# Patient Record
Sex: Female | Born: 1955 | Race: White | Hispanic: No | Marital: Married | Smoking: Former smoker
Health system: Southern US, Community
[De-identification: ages and names within clinical notes are randomized; demographics above are authoritative.]

## PROBLEM LIST (undated history)

## (undated) DIAGNOSIS — F32A Depression, unspecified: Secondary | ICD-10-CM

## (undated) DIAGNOSIS — R7302 Impaired glucose tolerance (oral): Secondary | ICD-10-CM

## (undated) DIAGNOSIS — N189 Chronic kidney disease, unspecified: Secondary | ICD-10-CM

## (undated) DIAGNOSIS — G4730 Sleep apnea, unspecified: Secondary | ICD-10-CM

## (undated) DIAGNOSIS — G4733 Obstructive sleep apnea (adult) (pediatric): Secondary | ICD-10-CM

## (undated) DIAGNOSIS — S161XXA Strain of muscle, fascia and tendon at neck level, initial encounter: Secondary | ICD-10-CM

## (undated) DIAGNOSIS — F329 Major depressive disorder, single episode, unspecified: Secondary | ICD-10-CM

## (undated) DIAGNOSIS — R931 Abnormal findings on diagnostic imaging of heart and coronary circulation: Secondary | ICD-10-CM

## (undated) DIAGNOSIS — F419 Anxiety disorder, unspecified: Secondary | ICD-10-CM

## (undated) DIAGNOSIS — Q0700 Arnold-Chiari syndrome without spina bifida or hydrocephalus: Secondary | ICD-10-CM

## (undated) DIAGNOSIS — Z87442 Personal history of urinary calculi: Secondary | ICD-10-CM

## (undated) DIAGNOSIS — S060X9A Concussion with loss of consciousness of unspecified duration, initial encounter: Secondary | ICD-10-CM

## (undated) DIAGNOSIS — M1990 Unspecified osteoarthritis, unspecified site: Secondary | ICD-10-CM

## (undated) DIAGNOSIS — R42 Dizziness and giddiness: Secondary | ICD-10-CM

## (undated) DIAGNOSIS — K219 Gastro-esophageal reflux disease without esophagitis: Secondary | ICD-10-CM

## (undated) DIAGNOSIS — I429 Cardiomyopathy, unspecified: Secondary | ICD-10-CM

## (undated) DIAGNOSIS — E669 Obesity, unspecified: Secondary | ICD-10-CM

## (undated) DIAGNOSIS — I10 Essential (primary) hypertension: Secondary | ICD-10-CM

## (undated) DIAGNOSIS — Z8719 Personal history of other diseases of the digestive system: Secondary | ICD-10-CM

## (undated) DIAGNOSIS — Z780 Asymptomatic menopausal state: Secondary | ICD-10-CM

## (undated) DIAGNOSIS — G43909 Migraine, unspecified, not intractable, without status migrainosus: Secondary | ICD-10-CM

## (undated) DIAGNOSIS — R51 Headache: Secondary | ICD-10-CM

## (undated) DIAGNOSIS — G5600 Carpal tunnel syndrome, unspecified upper limb: Secondary | ICD-10-CM

## (undated) DIAGNOSIS — G4486 Cervicogenic headache: Secondary | ICD-10-CM

## (undated) HISTORY — DX: Cervicogenic headache: G44.86

## (undated) HISTORY — DX: Arnold-Chiari syndrome without spina bifida or hydrocephalus: Q07.00

## (undated) HISTORY — DX: Sleep apnea, unspecified: G47.30

## (undated) HISTORY — DX: Major depressive disorder, single episode, unspecified: F32.9

## (undated) HISTORY — DX: Strain of muscle, fascia and tendon at neck level, initial encounter: S16.1XXA

## (undated) HISTORY — DX: Chronic kidney disease, unspecified: N18.9

## (undated) HISTORY — PX: COLOSTOMY: SHX63

## (undated) HISTORY — DX: Migraine, unspecified, not intractable, without status migrainosus: G43.909

## (undated) HISTORY — PX: BUNIONECTOMY: SHX129

## (undated) HISTORY — DX: Asymptomatic menopausal state: Z78.0

## (undated) HISTORY — DX: Obesity, unspecified: E66.9

## (undated) HISTORY — DX: Depression, unspecified: F32.A

## (undated) HISTORY — DX: Carpal tunnel syndrome, unspecified upper limb: G56.00

## (undated) HISTORY — DX: Headache: R51

## (undated) HISTORY — DX: Abnormal findings on diagnostic imaging of heart and coronary circulation: R93.1

## (undated) HISTORY — DX: Impaired glucose tolerance (oral): R73.02

## (undated) HISTORY — PX: HAND SURGERY: SHX662

## (undated) HISTORY — DX: Dizziness and giddiness: R42

## (undated) HISTORY — PX: UPPER GASTROINTESTINAL ENDOSCOPY: SHX188

## (undated) HISTORY — DX: Obstructive sleep apnea (adult) (pediatric): G47.33

## (undated) HISTORY — PX: GYNECOLOGIC CRYOSURGERY: SHX857

## (undated) HISTORY — PX: ABDOMINAL HYSTERECTOMY: SHX81

## (undated) HISTORY — PX: TUBAL LIGATION: SHX77

---

## 1997-11-24 ENCOUNTER — Emergency Department (HOSPITAL_COMMUNITY): Admission: EM | Admit: 1997-11-24 | Discharge: 1997-11-24 | Payer: Self-pay | Admitting: *Deleted

## 1999-04-10 ENCOUNTER — Encounter: Payer: Self-pay | Admitting: Emergency Medicine

## 1999-04-10 ENCOUNTER — Emergency Department (HOSPITAL_COMMUNITY): Admission: EM | Admit: 1999-04-10 | Discharge: 1999-04-10 | Payer: Self-pay | Admitting: Emergency Medicine

## 1999-06-27 ENCOUNTER — Inpatient Hospital Stay (HOSPITAL_COMMUNITY): Admission: EM | Admit: 1999-06-27 | Discharge: 1999-06-30 | Payer: Self-pay | Admitting: Emergency Medicine

## 2000-02-19 ENCOUNTER — Other Ambulatory Visit: Admission: RE | Admit: 2000-02-19 | Discharge: 2000-02-19 | Payer: Self-pay | Admitting: Orthopedic Surgery

## 2000-07-24 ENCOUNTER — Other Ambulatory Visit: Admission: RE | Admit: 2000-07-24 | Discharge: 2000-07-24 | Payer: Self-pay | Admitting: Obstetrics and Gynecology

## 2001-07-23 HISTORY — PX: VAGINAL HYSTERECTOMY: SUR661

## 2001-07-23 HISTORY — PX: ABDOMINAL HYSTERECTOMY: SHX81

## 2002-08-13 ENCOUNTER — Other Ambulatory Visit: Admission: RE | Admit: 2002-08-13 | Discharge: 2002-08-13 | Payer: Self-pay | Admitting: Obstetrics & Gynecology

## 2003-06-01 ENCOUNTER — Encounter (INDEPENDENT_AMBULATORY_CARE_PROVIDER_SITE_OTHER): Payer: Self-pay

## 2003-06-01 ENCOUNTER — Ambulatory Visit (HOSPITAL_BASED_OUTPATIENT_CLINIC_OR_DEPARTMENT_OTHER): Admission: RE | Admit: 2003-06-01 | Discharge: 2003-06-01 | Payer: Self-pay | Admitting: Gynecology

## 2003-06-01 ENCOUNTER — Ambulatory Visit (HOSPITAL_COMMUNITY): Admission: RE | Admit: 2003-06-01 | Discharge: 2003-06-01 | Payer: Self-pay | Admitting: Gynecology

## 2004-02-04 ENCOUNTER — Emergency Department (HOSPITAL_COMMUNITY): Admission: EM | Admit: 2004-02-04 | Discharge: 2004-02-04 | Payer: Self-pay | Admitting: Emergency Medicine

## 2004-04-14 ENCOUNTER — Emergency Department (HOSPITAL_COMMUNITY): Admission: EM | Admit: 2004-04-14 | Discharge: 2004-04-14 | Payer: Self-pay | Admitting: Emergency Medicine

## 2004-07-05 ENCOUNTER — Other Ambulatory Visit: Admission: RE | Admit: 2004-07-05 | Discharge: 2004-07-05 | Payer: Self-pay | Admitting: Gynecology

## 2004-07-11 ENCOUNTER — Observation Stay (HOSPITAL_COMMUNITY): Admission: RE | Admit: 2004-07-11 | Discharge: 2004-07-12 | Payer: Self-pay | Admitting: Gynecology

## 2004-07-11 ENCOUNTER — Encounter (INDEPENDENT_AMBULATORY_CARE_PROVIDER_SITE_OTHER): Payer: Self-pay | Admitting: Specialist

## 2004-07-23 DIAGNOSIS — Q0700 Arnold-Chiari syndrome without spina bifida or hydrocephalus: Secondary | ICD-10-CM

## 2004-07-23 HISTORY — DX: Arnold-Chiari syndrome without spina bifida or hydrocephalus: Q07.00

## 2004-09-19 ENCOUNTER — Ambulatory Visit: Payer: Self-pay | Admitting: Internal Medicine

## 2005-01-09 ENCOUNTER — Ambulatory Visit: Payer: Self-pay | Admitting: Internal Medicine

## 2005-03-03 ENCOUNTER — Encounter: Admission: RE | Admit: 2005-03-03 | Discharge: 2005-03-03 | Payer: Self-pay | Admitting: Neurology

## 2005-03-08 ENCOUNTER — Ambulatory Visit: Payer: Self-pay | Admitting: Psychology

## 2005-04-27 ENCOUNTER — Ambulatory Visit: Payer: Self-pay | Admitting: Psychology

## 2006-04-11 ENCOUNTER — Emergency Department (HOSPITAL_COMMUNITY): Admission: EM | Admit: 2006-04-11 | Discharge: 2006-04-11 | Payer: Self-pay | Admitting: Emergency Medicine

## 2007-09-19 ENCOUNTER — Emergency Department (HOSPITAL_COMMUNITY): Admission: EM | Admit: 2007-09-19 | Discharge: 2007-09-19 | Payer: Self-pay | Admitting: Neurosurgery

## 2007-09-26 ENCOUNTER — Emergency Department (HOSPITAL_COMMUNITY): Admission: EM | Admit: 2007-09-26 | Discharge: 2007-09-26 | Payer: Self-pay | Admitting: Family Medicine

## 2007-10-03 ENCOUNTER — Ambulatory Visit (HOSPITAL_COMMUNITY): Payer: Self-pay | Admitting: Psychology

## 2007-10-14 ENCOUNTER — Ambulatory Visit (HOSPITAL_COMMUNITY): Payer: Self-pay | Admitting: Psychology

## 2007-10-23 ENCOUNTER — Ambulatory Visit (HOSPITAL_COMMUNITY): Payer: Self-pay | Admitting: Psychology

## 2008-01-22 ENCOUNTER — Ambulatory Visit (HOSPITAL_COMMUNITY): Payer: Self-pay | Admitting: Psychology

## 2008-01-29 ENCOUNTER — Encounter: Payer: Self-pay | Admitting: Internal Medicine

## 2008-09-13 ENCOUNTER — Emergency Department (HOSPITAL_COMMUNITY): Admission: EM | Admit: 2008-09-13 | Discharge: 2008-09-13 | Payer: Self-pay | Admitting: Family Medicine

## 2010-02-18 ENCOUNTER — Encounter: Payer: Self-pay | Admitting: Cardiovascular Disease

## 2010-02-27 ENCOUNTER — Encounter: Payer: Self-pay | Admitting: Cardiovascular Disease

## 2010-03-16 DIAGNOSIS — R609 Edema, unspecified: Secondary | ICD-10-CM

## 2010-05-22 ENCOUNTER — Encounter (INDEPENDENT_AMBULATORY_CARE_PROVIDER_SITE_OTHER): Payer: Self-pay | Admitting: *Deleted

## 2010-06-02 ENCOUNTER — Ambulatory Visit (HOSPITAL_COMMUNITY): Payer: Self-pay | Admitting: Psychology

## 2010-06-09 ENCOUNTER — Encounter (INDEPENDENT_AMBULATORY_CARE_PROVIDER_SITE_OTHER): Payer: Self-pay | Admitting: *Deleted

## 2010-06-12 ENCOUNTER — Ambulatory Visit: Payer: Self-pay | Admitting: Gastroenterology

## 2010-07-03 ENCOUNTER — Ambulatory Visit: Payer: Self-pay | Admitting: Gastroenterology

## 2010-07-04 ENCOUNTER — Encounter: Payer: Self-pay | Admitting: Gastroenterology

## 2010-08-13 ENCOUNTER — Encounter: Payer: Self-pay | Admitting: Neurology

## 2010-08-24 NOTE — Letter (Signed)
Summary: [facility] Instructions  [facility]  [address] [place], [place] [address]   Phone: [phone]  Fax: [phone]       Carrie Reynolds    [birth year]    MRN: [id number]        Procedure Day Dorna Bloom:  Duanne Limerick  07/03/10     Arrival Time:  8:00AM     Procedure Time:  9:00AM     Location of Procedure:                    Juliann Pares  [facility] (4th Floor)                     PREPARATION FOR COLONOSCOPY WITH MOVIPREP   Starting 5 days prior to your procedure 06/28/10 do not eat nuts, seeds, popcorn, corn, beans, peas,  salads, or any raw vegetables.  Do not take any fiber supplements (e.g. Metamucil, Citrucel, and Benefiber).  THE DAY BEFORE YOUR PROCEDURE         DATE: 07/02/10  DAY: SUNDAY  1.  Drink clear liquids the entire day-NO SOLID FOOD  2.  Do not drink anything colored red or purple.  Avoid juices with pulp.  No orange juice.  3.  Drink at least 64 oz. (8 glasses) of fluid/clear liquids during the day to prevent dehydration and help the prep work efficiently.  CLEAR LIQUIDS INCLUDE: Water Jello Ice Popsicles Tea (sugar ok, no milk/cream) Powdered fruit flavored drinks Coffee (sugar ok, no milk/cream) Gatorade Juice: apple, white grape, white cranberry  Lemonade Clear bullion, consomm, broth Carbonated beverages (any kind) Strained chicken noodle soup Hard Candy                             4.  In the morning, mix first dose of MoviPrep solution:    Empty 1 Pouch A and 1 Pouch B into the disposable container    Add lukewarm drinking water to the top line of the container. Mix to dissolve    Refrigerate (mixed solution should be used within 24 hrs)  5.  Begin drinking the prep at 5:00 p.m. The MoviPrep container is divided by 4 marks.   Every 15 minutes drink the solution down to the next mark (approximately 8 oz) until the full liter is complete.   6.  Follow completed prep with 16 oz of clear liquid of your choice  (Nothing red or purple).  Continue to drink clear liquids until bedtime.  7.  Before going to bed, mix second dose of MoviPrep solution:    Empty 1 Pouch A and 1 Pouch B into the disposable container    Add lukewarm drinking water to the top line of the container. Mix to dissolve    Refrigerate  THE DAY OF YOUR PROCEDURE      DATE: 07/03/10   DAY: MONDAY  Beginning at 4:00AM (5 hours before procedure):         1. Every 15 minutes, drink the solution down to the next mark (approx 8 oz) until the full liter is complete.  2. Follow completed prep with 16 oz. of clear liquid of your choice.    3. You may drink clear liquids until 7:00AM (2 HOURS BEFORE PROCEDURE).   MEDICATION INSTRUCTIONS  Unless otherwise instructed, you should take regular prescription medications with a small sip of water   as early as possible the morning of your  procedure. Hold maxzide morning of procedure only.         OTHER INSTRUCTIONS  You will need a responsible adult at least 54 years of age to accompany you and drive you home.   This person must remain in the waiting room during your procedure.  Wear loose fitting clothing that is easily removed.  Leave jewelry and other valuables at home.  However, you may wish to bring a book to read or  an iPod/MP3 player to listen to music as you wait for your procedure to start.  Remove all body piercing jewelry and leave at home.  Total time from sign-in until discharge is approximately 2-3 hours.  You should go home directly after your procedure and rest.  You can resume normal activities the  day after your procedure.  The day of your procedure you should not:   Drive   Make legal decisions   Operate machinery   Drink alcohol   Return to work  You will receive specific instructions about eating, activities and medications before you leave.    The above instructions have been reviewed and explained to me by   Sherren Kerns RN  June 12, 2010 2:37 PM    I fully understand and can verbalize these instructions _____________________________ Date _________

## 2010-08-24 NOTE — Miscellaneous (Signed)
Summary: previsit prep/rm  Clinical Lists Changes  Medications: Added new medication of MOVIPREP 100 GM  SOLR (PEG-KCL-NACL-NASULF-NA ASC-C) As per prep instructions. - Signed Rx of MOVIPREP 100 GM  SOLR (PEG-KCL-NACL-NASULF-NA ASC-C) As per prep instructions.;  #1 x 0;  Signed;  Entered by: Sherren Kerns RN;  Authorized by: Mardella Layman MD [facility];  Method used: Electronically to [facility], [place], [place]  [id number], Ph: [phone], Fax: [phone] Allergies: Added new allergy or adverse reaction of PENICILLIN Added new allergy or adverse reaction of AMOXICILLIN Added new allergy or adverse reaction of AMPICILLIN Added new allergy or adverse reaction of TETRACYCLINE Added new allergy or adverse reaction of CECLOR Observations: Added new observation of NKA: F (06/12/2010 14:24)    Prescriptions: MOVIPREP 100 GM  SOLR (PEG-KCL-NACL-NASULF-NA ASC-C) As per prep instructions.  #1 x 0   Entered by:   Sherren Kerns RN   Authorized by:   Mardella Layman MD [facility]   Signed by:   Sherren Kerns RN on 06/12/2010   Method used:   Electronically to        [facility] (retail)       [address]       [place], [place]  [id number]       Ph: [phone]       Fax: [phone]   RxID:   [id number]

## 2010-08-24 NOTE — Procedures (Signed)
Summary: Colonoscopy  Patient: Carrie Reynolds Note: All result statuses are Final unless otherwise noted.  Tests: (1) Colonoscopy (COL)   COL Colonoscopy           DONE     [facility]     [address]     [place], [place]  [address]           COLONOSCOPY PROCEDURE REPORT           PATIENT:  Carrie Reynolds, Carrie Reynolds  MR#:  [id number]     BIRTHDATE:  Jul 30, 1955, 54 yrs. old  GENDER:  female     ENDOSCOPIST:  Vania Rea. Jarold Motto, MD, [facility]     REF. BY:  Ellamae Sia, M.D.     PROCEDURE DATE:  07/03/2010     PROCEDURE:  Colonoscopy with snare polypectomy     ASA CLASS:  Class II     INDICATIONS:  family history of colon cancer, Elevated Risk     Screening     MEDICATIONS:   Fentanyl 75 mcg IV, Versed 7 mg IV           DESCRIPTION OF PROCEDURE:   After the risks benefits and     alternatives of the procedure were thoroughly explained, informed     consent was obtained.  Digital rectal exam was performed and     revealed no abnormalities.   The [id number] [id number] endoscope was     introduced through the anus and advanced to the cecum, which was     identified by both the appendix and ileocecal valve, without     limitations.  The quality of the prep was excellent, using     MoviPrep.  The instrument was then slowly withdrawn as the colon     was fully examined.     <<PROCEDUREIMAGES>>           FINDINGS:  ULTRASONIC FINDINGS:   There were multiple polyps     identified and removed. -15 MM FLAT AND STALKED POLYPS HOT SNARE     EXCISED.#7.SEE PICTURES.ALL IN SIGMOID AND RECTAL AREAS. Scattered     diverticula were found in the sigmoid to descending colon     segments.   Retroflexed views in the rectum revealed no     abnormalities.    The scope was then withdrawn from the patient     and the procedure completed.           COMPLICATIONS:  None     ENDOSCOPIC IMPRESSION:     1) Diverticula, scattered in the sigmoid to descending colon     segments     2) Polyps,  multiple     ??? LYNCH SYNDROME PER MULTIPLE ADENOMAS AND ++ FH OF 2     GENERATIONS COLON CA.     RECOMMENDATIONS:     1) Await pathology results     2) High fiber diet.     3) Repeat Colonoscopy in 1 year.     CONSIDER GENETIC TESTING.     REPEAT EXAM:  No           ______________________________     Vania Rea. Jarold Motto, MD, [facility]           CC:           n.     eSIGNED:   Vania Rea. Patterson at 07/03/2010 09:42 AM           Kenemer-Denham, Cherylann Ratel, [id number]  Note: An exclamation mark (!) indicates  a result that was not dispersed into the flowsheet. Document Creation Date: 07/03/2010 9:43 AM _______________________________________________________________________  (1) Order result status: Final Collection or observation date-time: 07/03/2010 09:35 Requested date-time:  Receipt date-time:  Reported date-time:  Referring Physician:   Ordering Physician: Sheryn Bison [phone]) Specimen Source:  Source: Launa Grill Order Number: [id number] Lab site:   Appended Document: Colonoscopy one-year followup  Appended Document: Colonoscopy Recall     Procedures Next Due Date:    Colonoscopy: 07/2011

## 2010-08-24 NOTE — Letter (Signed)
Summary: Pre Visit Letter Revised  [facility]  [address] [place], [place] [address]   Phone: [phone]  Fax: [phone]        05/22/2010 MRN: [id number]  Carrie Reynolds [address] [place], [place]  [address]             Procedure Date:  12-12 at 9am            Dr Jarold Motto  Welcome to the Gastroenterology Division at [facility].    You are scheduled to see a nurse for your pre-procedure visit on 06-12-10 at  2:30pm on the 3rd floor at [facility], [address]  We ask that you try to arrive at our office 15 minutes prior to your appointment time to allow for check-in.  Please take a minute to review the attached form.  If you answer "Yes" to one or more of the questions on the first page, we ask that you call the person listed at your earliest opportunity.  If you answer "No" to all of the questions, please complete the rest of the form and bring it to your appointment.    Your nurse visit will consist of discussing your medical and surgical history, your immediate family medical history, and your medications.   If you are unable to list all of your medications on the form, please bring the medication bottles to your appointment and we will list them.  We will need to be aware of both prescribed and over the counter drugs.  We will need to know exact dosage information as well.    Please be prepared to read and sign documents such as consent forms, a financial agreement, and acknowledgement forms.  If necessary, and with your consent, a friend or relative is welcome to sit-in on the nurse visit with you.  Please bring your insurance card so that we may make a copy of it.  If your insurance requires a referral to see a specialist, please bring your referral form from your primary care physician.  No co-pay is required for this nurse visit.     If you cannot keep your appointment, please call [phone] to cancel or reschedule  prior to your appointment date.  This allows [place] the opportunity to schedule an appointment for another patient in need of care.    Thank you for choosing [facility] for your medical needs.  We appreciate the opportunity to care for you.  Please visit [place] at our website  to learn more about our practice.  Sincerely, The Gastroenterology Division

## 2010-08-24 NOTE — Letter (Signed)
Summary: [facility]  [facility]   Imported By: Marylou Mccoy 04/05/2010 15:20:58  _____________________________________________________________________  External Attachment:    Type:   Image     Comment:   External Document

## 2010-08-24 NOTE — Letter (Signed)
Summary: [facility]  [facility]   Imported By: Marylou Mccoy 04/05/2010 15:23:01  _____________________________________________________________________  External Attachment:    Type:   Image     Comment:   External Document

## 2010-08-24 NOTE — Letter (Signed)
Summary: Patient Notice- Polyp Results  [facility]  [address] [place], [place] [address]   Phone: [phone]  Fax: [phone]        July 04, 2010 MRN: [id number]    Carrie Reynolds [address] [place], [place]  [address]    Dear Ms. KENEMER-Casebolt,  I am pleased to inform you that the colon polyp(s) removed during your recent colonoscopy was (were) found to be benign (no cancer detected) upon pathologic examination.  I recommend you have a repeat colonoscopy examination in 1_ years to look for recurrent polyps, as having colon polyps increases your risk for having recurrent polyps or even colon cancer in the future.  Should you develop new or worsening symptoms of abdominal pain, bowel habit changes or bleeding from the rectum or bowels, please schedule an evaluation with either your primary care physician or with me.  Additional information/recommendations:  __ No further action with gastroenterology is needed at this time. Please      follow-up with your primary care physician for your other healthcare      needs.  __ Please call [phone] to schedule a return visit to review your      situation.  __ Please keep your follow-up visit as already scheduled.  xx__ Continue treatment plan as outlined the day of your exam.I would not do genetic assay currently. The polyps are a mixture of adenomatous and hyperplastic polyps. I would recommend close followup however.  Please call us if you are having persistent problems or have questions about your condition that have not been fully answered at this time.  Sincerely,  Mardella Layman MD [facility]  This letter has been electronically signed by your physician.  Appended Document: Patient Notice- Polyp Results Letter Mailed

## 2010-11-15 ENCOUNTER — Emergency Department (HOSPITAL_COMMUNITY): Payer: BC Managed Care – PPO

## 2010-11-15 ENCOUNTER — Emergency Department (HOSPITAL_COMMUNITY)
Admission: EM | Admit: 2010-11-15 | Discharge: 2010-11-15 | Disposition: A | Discharge status: Home / Self Care | Payer: BC Managed Care – PPO | Attending: Emergency Medicine | Admitting: Emergency Medicine

## 2010-11-15 DIAGNOSIS — G43909 Migraine, unspecified, not intractable, without status migrainosus: Secondary | ICD-10-CM | POA: Insufficient documentation

## 2010-11-15 DIAGNOSIS — F29 Unspecified psychosis not due to a substance or known physiological condition: Secondary | ICD-10-CM | POA: Insufficient documentation

## 2010-12-08 NOTE — Op Note (Signed)
NAME:  Carrie Reynolds, Carrie Reynolds               ACCOUNT NO.:  [id number]   MEDICAL RECORD NO.:  [id number]          PATIENT TYPE:  OBV   LOCATION:  9312                          FACILITY:  [facility]   PHYSICIAN:  Ivor Costa. Farrel Gobble, M.D. DATE OF BIRTH:  Jun 04, 1956   DATE OF PROCEDURE:  07/11/2004  DATE OF DISCHARGE:                                 OPERATIVE REPORT   PREOPERATIVE DIAGNOSES:  1.  Chronic left lower quadrant pain.  2.  Menorrhagia.  3.  Recurrent endometrial polyp.   POSTOPERATIVE DIAGNOSES:  1.  Chronic left lower quadrant pain.  2.  Menorrhagia.  3.  Recurrent endometrial polyp.   PROCEDURE:  Laparoscopically-assisted vaginal hysterectomy with left  salpingo-oophorectomy.   SURGEON:  Ivor Costa. Farrel Gobble, M.D.   ASSISTANT:  Gaetano Hawthorne. Lily Peer, M.D.   ANESTHESIA:  General.   FLUIDS REPLACED:  1500 mL of lactated Ringer's.   ESTIMATED BLOOD LOSS:  150 mL.   URINE OUTPUT:  200 mL of clear urine.   FINDINGS:  Normal-appearing left adnexa.  There were some bowel adhesions on  the left-hand side.  The uterus was globular.  The cervix was patulous.   COMPLICATIONS:  None.   PATHOLOGY:  Uterus, cervix, left tube and ovary.   PROCEDURE:  The patient was taken to the operating room and general  anesthesia was induced, placed in the dorsal lithotomy position, prepped and  draped in the usual sterile fashion.  A bivalve speculum was placed in the  vagina and the cervix was visualized and the uterine manipulator was then  placed.  Gloves were changed and attention was turned to the abdomen.  An  infraumbilical incision was made with the scalpel, through which the Veress  needle was inserted and pneumoperitoneum was created until tympany was  appreciated above the liver, after which a 10/11 disposable trocar was  inserted through the infraumbilical port and placement in the abdomen was  confirmed.  Two lower 5 mm ports were then placed under direct  visualization.  The patient was placed  in Trendelenburg.  The uterus was  elevated.  The ureter on the left-hand side was visualized.  The left adnexa  was essentially unremarkable.  The adnexa was elevated.  The IP was  identified, cauterized, and transected with the tripolar.  The posterior  leaf of the broad ligament was similarly transected with the tripolar, the  dissection carried through until the round ligament, which was similarly  cauterized and transected.  The anterior leaf of the broad ligament was then  incised, elevated, and sharply brought down to the midline.  The uterus was  then rotated toward the left-hand side.  The tubo-ovarian ligament was  identified, similarly cauterized and transected with the tripolar.  The  clamps from the tubal ligation were incorporated with the specimen. A small  amount of the posterior leaf of the broad ligament was then incised.  The  round ligament was similarly transected and suture ligated with 0 Vicryl,  excuse me, with the tripolar.  The remainder of the bladder flap was then  created with the knife portion of the  tripolar, and it was noted to be  hemostatic.  It was gently brought down slightly more, and attention was  then turned to the vaginal portion of the case.  The patient was  repositioned, legs were elevated.  A sterile weighted speculum was placed in  the posterior vagina.  It was a Training and development officer.  The cervix was grasped with a Christella Hartigan  tenaculum.  The vaginal mucosa was injected with 13 mL of dilute lidocaine  with epinephrine.  The vaginal mucosa was scored circumferentially.  The  vaginal mucosa was then just bluntly dissected off.  The cervix was deviated  anteriorly and a posterior colpotomy was performed sharply.  A long sterile  weighted speculum was then placed in the posterior cul-de-sac.  The  uterosacral ligaments were then transected and suture ligated with 0 Vicryl  and held for later identification.  The round ligaments were similarly  transected and suture  ligated.  The dissection carried through anteriorly.  The loose areolar tissue was bluntly dissected off.  An anterior colpotomy  was ultimately reached and the uterine vessels were then able to be  transected and suture ligated.  The cervix was quite patulous and somewhat  obstructed our view.  We ended morcellating the lower portion of the uterus  plus cervix, which allowed [place] to deliver the fundal portion.  Once this was  done, the remaining tissue was gently guided over an examining finger and  the Heaney clamp was placed and the specimen was dissected off, and this was  done bilaterally.  The pedicle on the left-hand was rather small, and a free  ligature was placed.  It was held for later identification although noted to  be hemostatic.  The pedicle on the right-hand side was trimmed and then was  similarly small and a free ligature again of 0 Vicryl was placed.  The  peritoneum was then plicated to the vaginal mucosa from above the  uterosacral ligament around to above the uterosacral ligament.  The anterior  peritoneum was identified and held with the Allis clamps.  The vaginal  mucosa was plicated anterior to posterior from uterosacral ligament to  uterosacral ligament and noted to be hemostatic.  The upper clamps were cut,  as were the uterosacrals.  The pelvis had been irrigated prior to closure of  the cuff and was noted to be hemostatic.  A pneumoperitoneum was then  recreated up above.  The patient was placed in Trendelenburg position.  Inspection of the pedicles assured [place] of hemostasis as well as the cuff  after suction irrigation.  The instruments were then removed.  The  infraumbilical fascia was closed with a figure-of-eight of 0 Vicryl.  The  skin was closed with 3-0 plain.  The lower ports were closed with tincture  of Benzoin and steri-stripped.  All three ports were injected with a total  of 10 mL of 0.25% Marcaine.     Trac   THL/MEDQ  D:  07/11/2004  T:   07/12/2004  Job:  [id number]

## 2010-12-08 NOTE — Discharge Summary (Signed)
NAME:  Carrie Reynolds, Carrie Reynolds               ACCOUNT NO.:  [id number]   MEDICAL RECORD NO.:  [id number]          PATIENT TYPE:  OBV   LOCATION:  9312                          FACILITY:  [facility]   PHYSICIAN:  Ivor Costa. Farrel Gobble, M.D. DATE OF BIRTH:  [birth year]   DATE OF ADMISSION:  07/11/2004  DATE OF DISCHARGE:                                 DISCHARGE SUMMARY   PRINCIPAL DIAGNOSIS:  Menorrhagia/recurrent endometrial polyp.   ADDITIONAL DIAGNOSIS:  Chronic left lower quadrant pain.   PRINCIPAL PROCEDURE:  Laparoscopic-assisted vaginal hysterectomy.   ADDITIONAL PROCEDURE:  Left salpingo-oophorectomy.   HOSPITAL COURSE:  Refer to the dictated H&P.  The patient presented in the  afternoon of July 11, 2004 and underwent LAVH/LSO under general  anesthesia for the indications as mentioned above.  Her findings were a  normal-appearing left adnexa with some minimal bowel adhesions on the left,  a globular uterus, and a patulace cervix.  The patient tolerated the  procedure well, was extubated in the OR, and transferred to the PACU and  postoperative floor in due fashion.  Her postoperative course was  unremarkable.  The patient by the morning of postoperative day #1 had  already had her Foley removed.  She was voiding without difficulty and  ambulated in the room without any problems.  By the afternoon of  postoperative day #1 the patient was tolerating regular food, had no nausea  or vomiting, had had no vaginal bleeding, and had minimal pain.  She  remained afebrile with vital signs stable.  Her heart was regular rate, her  lungs were clear to auscultation.  She had normal bowel sounds and her  abdomen was soft and nontender.  Her incisions were intact.  Her  postoperative disposition was stable.  She was instructed to follow up in  the office in 2 weeks.  She will use over-the-counter pain medication plus  medication given at the preoperative visit.   POSTOPERATIVE LABORATORY DATA:  Her  hemoglobin was 12.2, hematocrit 34.9,  platelets of 240, and white count of 11.5.     Trac   THL/MEDQ  D:  07/12/2004  T:  07/12/2004  Job:  [id number]

## 2010-12-08 NOTE — Op Note (Signed)
   NAME:  Carrie Reynolds, Carrie Reynolds                         ACCOUNT NO.:  [id number]   MEDICAL RECORD NO.:  [id number]                   PATIENT TYPE:  AMB   LOCATION:  [facility]                                 [facility]ACILITY:  [facility]   PHYSICIAN:  Ivor Costa. Farrel Gobble, M.D.              DATE OF BIRTH:  09-Nov-1955   DATE OF PROCEDURE:  06/01/2003  DATE OF DISCHARGE:                                 OPERATIVE REPORT   PREOPERATIVE DIAGNOSES:  1. Menorrhagia.  2. Endometrial polyp.  3. Family history of uterine cancer.   POSTOPERATIVE DIAGNOSES:  1. Menorrhagia.  2. Endometrial polyp.  3. Family history of uterine cancer.   PROCEDURE:  D&C hysteroscopy with resectoscope.   SURGEON:  Ivor Costa. Farrel Gobble, M.D.   ANESTHESIA:  General.   ESTIMATED BLOOD LOSS:  Minimal.  _________  2% sorbitol solution.   FINDINGS:  There was an anterior wall defect that was resected.   PATHOLOGY:  Uterine polyp.   COMPLICATIONS:  None.   DESCRIPTION OF PROCEDURE:  The patient was taken to the operating room,  general anesthesia was induced, and placed in the dorsal lithotomy position.  The ________ was removed and prepped and draped in the usual sterile  fashion. A bivalve speculum was placed in the vagina, the cervix was  visualized and stabilized with a single tooth tenaculum, it was noted to be  dilated greater than 35 [place] after which the operative scope was advanced  through the cervix towards the fundus. The anterior wall defect was noted  using both cotton and cautery. The defect was removed in several passes the  base of which was cauterized afterwards and hemostasis was assured. A gentle  curettage was performed and any residual tissue was removed. The  resectoscope was advanced back through the  cervix and the defect was noted to be removed in its entirety. The  instruments were then removed from the vagina, the patient tolerated the  procedure well. Sponge and sharp needle counts correct x2 and she was  transferred to the PACU in stable condition.                                               Ivor Costa. Farrel Gobble, M.D.    THL/MEDQ  D:  06/01/2003  T:  06/01/2003  Job:  [id number]

## 2010-12-08 NOTE — H&P (Signed)
NAME:  Carrie Reynolds, VOLLMAN NO.:  [id number]   MEDICAL RECORD NO.:  [id number]                    PATIENT TYPE:   LOCATION:                                       FACILITY:  [facility]   PHYSICIAN:  Ivor Costa. Farrel Gobble, M.D.              DATE OF BIRTH:   DATE OF ADMISSION:  DATE OF DISCHARGE:                                HISTORY & PHYSICAL   CHIEF COMPLAINT:  Menorrhagia, questionable endometrial polyp.   HISTORY OF PRESENT ILLNESS:  The patient is a 55 year old G4, P2, with a  history of increasing menorrhagia over the past year.  The patient states  that she uses 10 to 15 pads a day for a 4- to 5-day cycle, which is an acute  change for her.  She also reports some dysmenorrhea for which she has been  using Anaprox with marginal relief.  She did not pass any clots.  Her  history is significant for questionable uterine cancer in a sister at 70 and  a mother at 2, both of whom are alive and well and neither of whom received  radiation therapy.  The patient is unfortunately estranged from both her  sibling and her mother.  The patient denies any dyspareunia.  Her Pap smears  have been normal.  The patient had a sonohysterogram which showed the  presence of a 2.3 x 2.0 x 1.1-cm anterior wall defect consistent with a  polyp and she presents today for a D&C/hysteroscopy with resectoscope.   PAST OBSTETRICAL/GYNECOLOGICAL HISTORY:  Past OB/GYN history is significant  for regular Pap smears, normal menses, two spontaneous vaginal deliveries  without complication.   PAST MEDICAL HISTORY:  Past medical history is significant for chronic  hypertension.   PAST SURGICAL HISTORY:  She had tubal ligation in 1986 and she had carpal  tunnel surgery done in 2001.   MEDICATIONS:  She is on Prinizide.   FAMILY HISTORY:  Family history is significant for uterine cancer in a  sister and mother, as mentioned above, and colon cancer in a maternal  grandmother in her 69s.  There  is no family history of prostate cancer.   SOCIAL HISTORY:  Rare tobacco.  Approximately two cups of coffee a day.  No  formal exercise.  No caffeine.   ALLERGIES:  PENICILLIN.   PHYSICAL EXAMINATION:  GENERAL:  On physical examination, she is well-  appearing, in no acute distress.  VITAL SIGNS:  Her blood pressure is 122/76.  HEART:  Her heart is regular rate.  LUNGS:  Her lungs are clear to auscultation.  ABDOMEN:  Her abdomen is soft without rebound or guarding.  BREASTS:  Her breasts are without mass, discharge or skin changes.  GYN:  She has got a parous introitus.  The BUS is negative.  The vagina was  pink and moist.  The cervix is parous without any gross lesions.  The  uterus  was bulky.  The adnexa without fullness.  Rectovaginal exam is confirmatory  and no uterosacral ligament tenderness.   ASSESSMENT:  Menorrhagia with family history of uterine cancer, questionable  endometrial polyp.    PLAN:  The patient will present for a D&C/hysteroscopy.  Risks and benefits  of the procedure were discussed and outlined with the patient and all  questions were addressed.                                               Ivor Costa. Farrel Gobble, M.D.    THL/MEDQ  D:  05/31/2003  T:  05/31/2003  Job:  [id number]

## 2011-01-26 ENCOUNTER — Ambulatory Visit
Admission: RE | Admit: 2011-01-26 | Discharge: 2011-01-26 | Disposition: A | Discharge status: Home / Self Care | Payer: BC Managed Care – PPO | Source: Ambulatory Visit | Attending: Family Medicine | Admitting: Family Medicine

## 2011-01-26 ENCOUNTER — Other Ambulatory Visit: Payer: Self-pay | Admitting: Family Medicine

## 2011-01-26 DIAGNOSIS — N200 Calculus of kidney: Secondary | ICD-10-CM

## 2011-03-09 ENCOUNTER — Emergency Department (HOSPITAL_COMMUNITY)
Admission: EM | Admit: 2011-03-09 | Discharge: 2011-03-10 | Disposition: A | Discharge status: Home / Self Care | Payer: BC Managed Care – PPO | Attending: Emergency Medicine | Admitting: Emergency Medicine

## 2011-03-09 ENCOUNTER — Emergency Department (HOSPITAL_COMMUNITY): Payer: BC Managed Care – PPO

## 2011-03-09 ENCOUNTER — Inpatient Hospital Stay (INDEPENDENT_AMBULATORY_CARE_PROVIDER_SITE_OTHER)
Admission: RE | Admit: 2011-03-09 | Discharge: 2011-03-09 | Disposition: A | Discharge status: Home / Self Care | Payer: BC Managed Care – PPO | Source: Ambulatory Visit | Attending: Emergency Medicine | Admitting: Emergency Medicine

## 2011-03-09 DIAGNOSIS — T148XXA Other injury of unspecified body region, initial encounter: Secondary | ICD-10-CM | POA: Insufficient documentation

## 2011-03-09 DIAGNOSIS — S060X0A Concussion without loss of consciousness, initial encounter: Secondary | ICD-10-CM

## 2011-03-09 DIAGNOSIS — I10 Essential (primary) hypertension: Secondary | ICD-10-CM | POA: Insufficient documentation

## 2011-03-09 DIAGNOSIS — R51 Headache: Secondary | ICD-10-CM | POA: Insufficient documentation

## 2011-03-28 ENCOUNTER — Other Ambulatory Visit (HOSPITAL_COMMUNITY): Payer: Self-pay | Admitting: Chiropractic Medicine

## 2011-03-28 DIAGNOSIS — M542 Cervicalgia: Secondary | ICD-10-CM

## 2011-04-02 ENCOUNTER — Other Ambulatory Visit (HOSPITAL_COMMUNITY): Payer: Self-pay | Admitting: Chiropractic Medicine

## 2011-04-02 DIAGNOSIS — G44009 Cluster headache syndrome, unspecified, not intractable: Secondary | ICD-10-CM

## 2011-04-02 DIAGNOSIS — H9319 Tinnitus, unspecified ear: Secondary | ICD-10-CM

## 2011-04-03 ENCOUNTER — Ambulatory Visit (HOSPITAL_COMMUNITY)
Admission: RE | Admit: 2011-04-03 | Discharge: 2011-04-03 | Disposition: A | Discharge status: Home / Self Care | Payer: BC Managed Care – PPO | Source: Ambulatory Visit | Attending: Chiropractic Medicine | Admitting: Chiropractic Medicine

## 2011-04-03 DIAGNOSIS — H9319 Tinnitus, unspecified ear: Secondary | ICD-10-CM | POA: Insufficient documentation

## 2011-04-03 DIAGNOSIS — M5020 Other cervical disc displacement, unspecified cervical region: Secondary | ICD-10-CM | POA: Insufficient documentation

## 2011-04-03 DIAGNOSIS — R209 Unspecified disturbances of skin sensation: Secondary | ICD-10-CM | POA: Insufficient documentation

## 2011-04-03 DIAGNOSIS — G44009 Cluster headache syndrome, unspecified, not intractable: Secondary | ICD-10-CM

## 2011-04-03 DIAGNOSIS — M545 Low back pain, unspecified: Secondary | ICD-10-CM | POA: Insufficient documentation

## 2011-04-03 DIAGNOSIS — M5380 Other specified dorsopathies, site unspecified: Secondary | ICD-10-CM | POA: Insufficient documentation

## 2011-04-03 DIAGNOSIS — M542 Cervicalgia: Secondary | ICD-10-CM | POA: Insufficient documentation

## 2011-04-03 DIAGNOSIS — R51 Headache: Secondary | ICD-10-CM | POA: Insufficient documentation

## 2011-04-03 DIAGNOSIS — M25519 Pain in unspecified shoulder: Secondary | ICD-10-CM | POA: Insufficient documentation

## 2011-04-09 ENCOUNTER — Other Ambulatory Visit (HOSPITAL_COMMUNITY): Payer: Self-pay | Admitting: Chiropractic Medicine

## 2011-04-09 DIAGNOSIS — R52 Pain, unspecified: Secondary | ICD-10-CM

## 2011-04-10 ENCOUNTER — Other Ambulatory Visit (HOSPITAL_COMMUNITY): Payer: Self-pay | Admitting: Chiropractic Medicine

## 2011-04-10 ENCOUNTER — Ambulatory Visit (HOSPITAL_COMMUNITY)
Admission: RE | Admit: 2011-04-10 | Discharge: 2011-04-10 | Disposition: A | Discharge status: Home / Self Care | Payer: BC Managed Care – PPO | Source: Ambulatory Visit | Attending: Chiropractic Medicine | Admitting: Chiropractic Medicine

## 2011-04-10 DIAGNOSIS — R52 Pain, unspecified: Secondary | ICD-10-CM

## 2011-04-10 DIAGNOSIS — M25529 Pain in unspecified elbow: Secondary | ICD-10-CM | POA: Insufficient documentation

## 2011-04-11 ENCOUNTER — Ambulatory Visit (HOSPITAL_COMMUNITY)
Admission: RE | Admit: 2011-04-11 | Discharge: 2011-04-11 | Disposition: A | Discharge status: Home / Self Care | Payer: BC Managed Care – PPO | Source: Ambulatory Visit | Attending: Chiropractic Medicine | Admitting: Chiropractic Medicine

## 2011-04-11 DIAGNOSIS — M5126 Other intervertebral disc displacement, lumbar region: Secondary | ICD-10-CM | POA: Insufficient documentation

## 2011-04-11 DIAGNOSIS — M545 Low back pain, unspecified: Secondary | ICD-10-CM | POA: Insufficient documentation

## 2011-04-11 DIAGNOSIS — M79609 Pain in unspecified limb: Secondary | ICD-10-CM | POA: Insufficient documentation

## 2011-04-11 DIAGNOSIS — R52 Pain, unspecified: Secondary | ICD-10-CM

## 2011-04-11 DIAGNOSIS — M48061 Spinal stenosis, lumbar region without neurogenic claudication: Secondary | ICD-10-CM | POA: Insufficient documentation

## 2011-04-18 ENCOUNTER — Emergency Department (HOSPITAL_COMMUNITY)
Admission: EM | Admit: 2011-04-18 | Discharge: 2011-04-18 | Disposition: A | Discharge status: Home / Self Care | Payer: BC Managed Care – PPO | Attending: Emergency Medicine | Admitting: Emergency Medicine

## 2011-04-18 DIAGNOSIS — F0781 Postconcussional syndrome: Secondary | ICD-10-CM | POA: Insufficient documentation

## 2011-04-18 DIAGNOSIS — I10 Essential (primary) hypertension: Secondary | ICD-10-CM | POA: Insufficient documentation

## 2011-04-18 DIAGNOSIS — Z79899 Other long term (current) drug therapy: Secondary | ICD-10-CM | POA: Insufficient documentation

## 2011-04-18 DIAGNOSIS — H9319 Tinnitus, unspecified ear: Secondary | ICD-10-CM | POA: Insufficient documentation

## 2011-04-18 DIAGNOSIS — R209 Unspecified disturbances of skin sensation: Secondary | ICD-10-CM | POA: Insufficient documentation

## 2011-04-18 DIAGNOSIS — R110 Nausea: Secondary | ICD-10-CM | POA: Insufficient documentation

## 2011-05-25 ENCOUNTER — Other Ambulatory Visit: Payer: Self-pay | Admitting: Neurology

## 2011-05-31 ENCOUNTER — Ambulatory Visit
Admission: RE | Admit: 2011-05-31 | Discharge: 2011-05-31 | Disposition: A | Discharge status: Home / Self Care | Payer: BC Managed Care – PPO | Source: Ambulatory Visit | Attending: Neurology | Admitting: Neurology

## 2011-05-31 MED ORDER — GADOBENATE DIMEGLUMINE 529 MG/ML IV SOLN
20.0000 mL | Freq: Once | INTRAVENOUS | Status: AC | PRN
  Administered 2011-05-31: 20 mL via INTRAVENOUS

## 2011-06-30 ENCOUNTER — Other Ambulatory Visit (HOSPITAL_COMMUNITY): Payer: Self-pay | Admitting: Psychiatry

## 2011-07-02 ENCOUNTER — Ambulatory Visit (INDEPENDENT_AMBULATORY_CARE_PROVIDER_SITE_OTHER): Payer: BC Managed Care – PPO

## 2011-07-02 DIAGNOSIS — R05 Cough: Secondary | ICD-10-CM

## 2011-07-02 DIAGNOSIS — R059 Cough, unspecified: Secondary | ICD-10-CM

## 2011-07-02 DIAGNOSIS — B9789 Other viral agents as the cause of diseases classified elsewhere: Secondary | ICD-10-CM

## 2011-07-19 ENCOUNTER — Ambulatory Visit (INDEPENDENT_AMBULATORY_CARE_PROVIDER_SITE_OTHER): Payer: BC Managed Care – PPO

## 2011-07-19 DIAGNOSIS — N760 Acute vaginitis: Secondary | ICD-10-CM

## 2011-07-19 DIAGNOSIS — I10 Essential (primary) hypertension: Secondary | ICD-10-CM

## 2011-08-02 IMAGING — CT CT ABD-PELV W/O CM
1 of 3 series · 15 of 31 positions shown, 19 images · non-contrast
Comparison: None.

CLINICAL DATA: Severe right flank pain.  Hematuria.

CT ABDOMEN AND PELVIS WITHOUT CONTRAST
TECHNIQUE: Multidetector CT imaging of the abdomen and pelvis was
performed following the standard protocol without intravenous
contrast.

[Series 3: lung windows · axial · 0.68mm/px · z∈[-106,+14]mm · 15 of 27 slices shown, 19 images]
[im 2/27  soft-tissue]
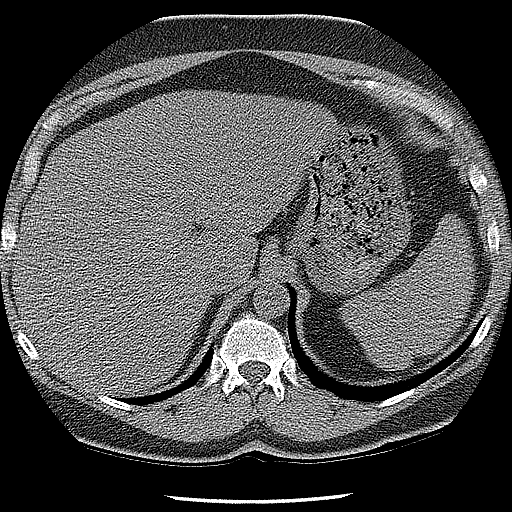
[im 2/27  bone]
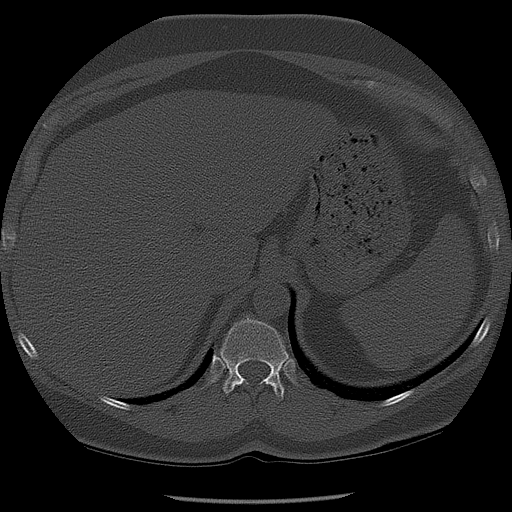
[im 4/27  soft-tissue]
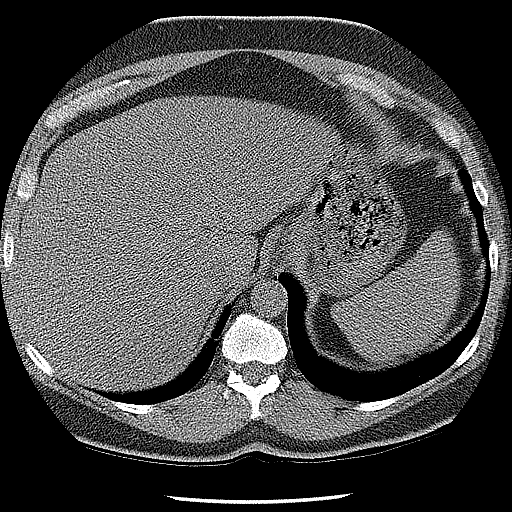
[im 6/27  soft-tissue]
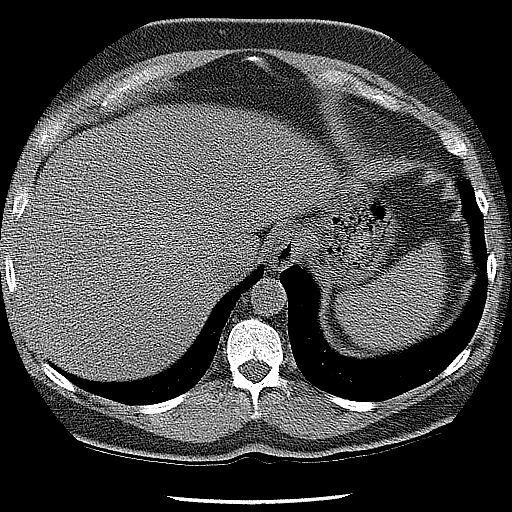
[im 8/27  soft-tissue]
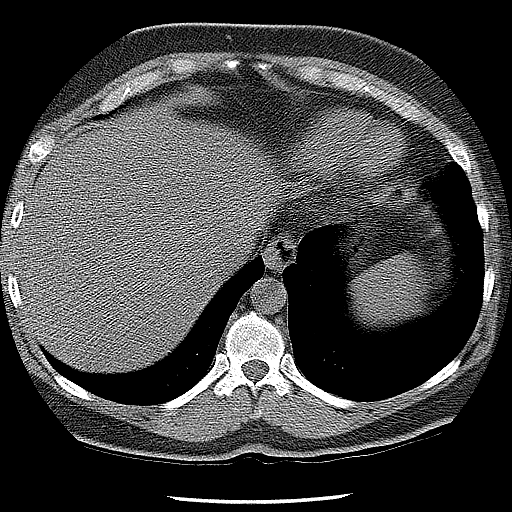
[im 10/27  soft-tissue]
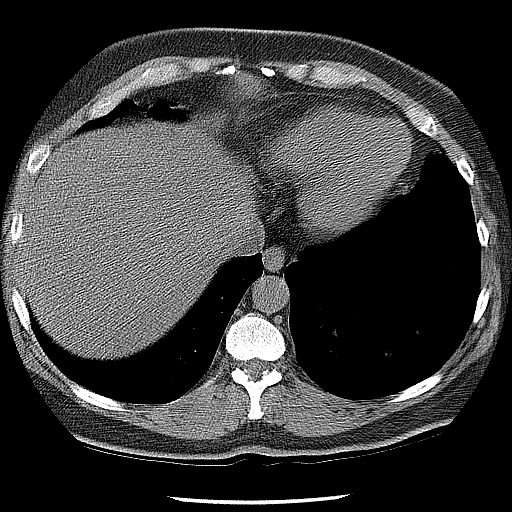
[im 12/27  soft-tissue]
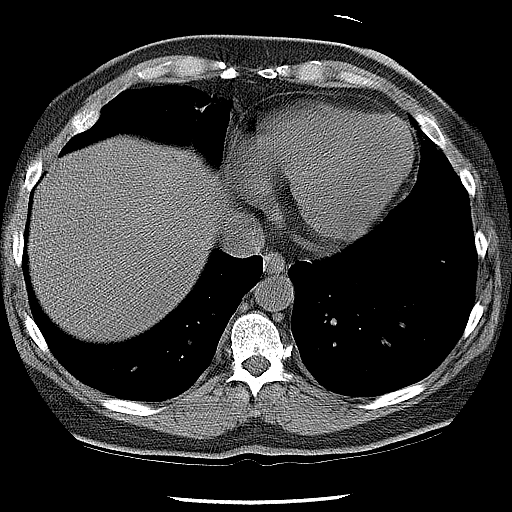
[im 14/27  soft-tissue]
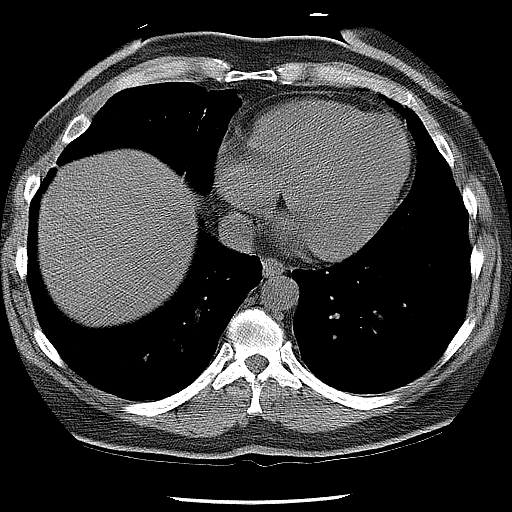
[im 16/27  soft-tissue]
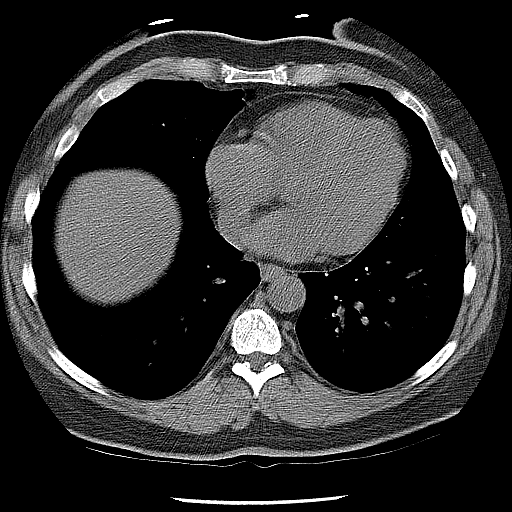
[im 18/27  soft-tissue]
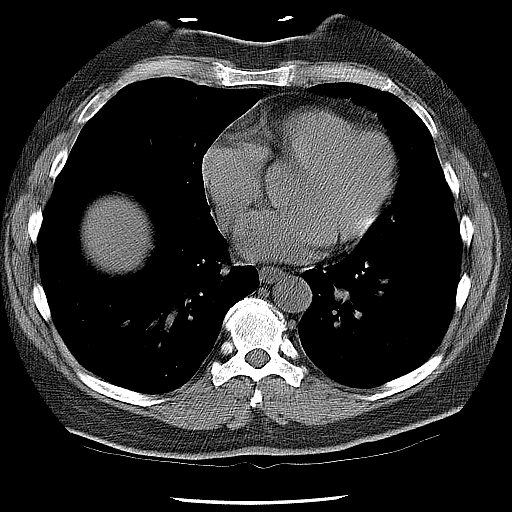
[im 18/27  bone]
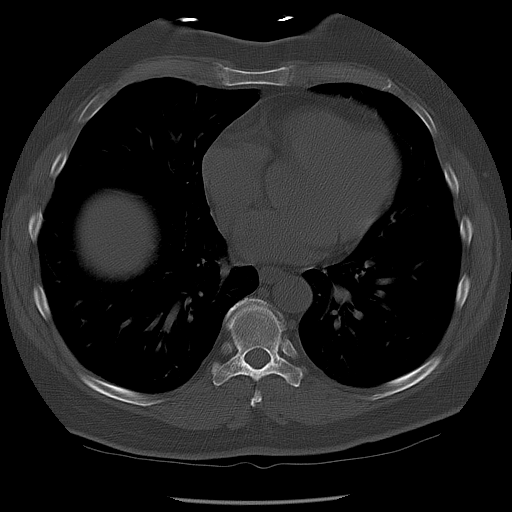
[im 20/27  soft-tissue]
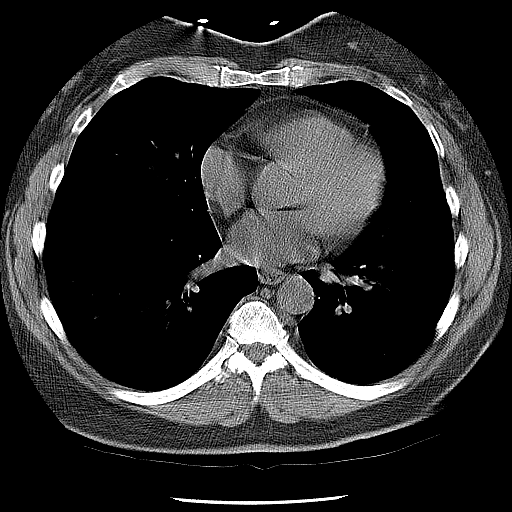
[im 22/27  soft-tissue]
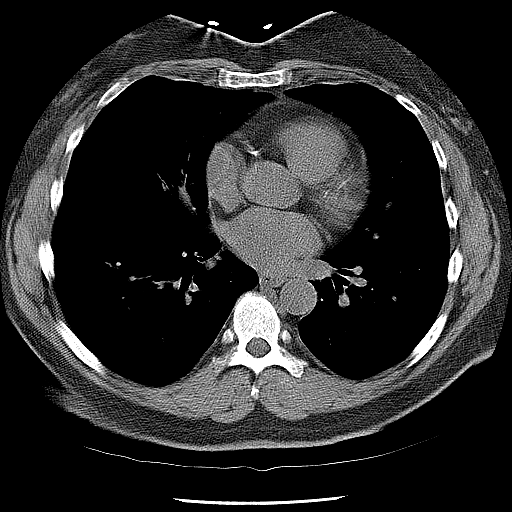
[im 23/27  lung]
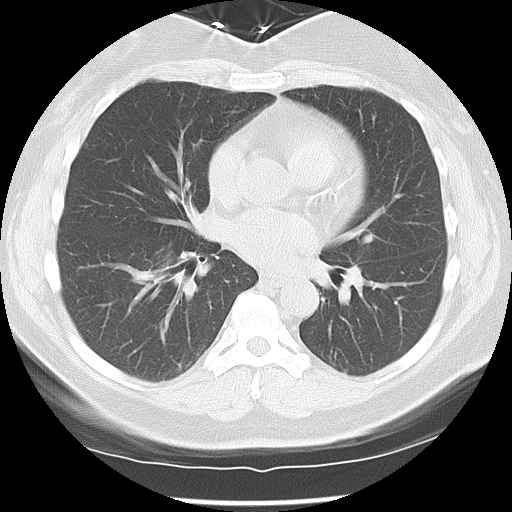
[im 24/27  soft-tissue]
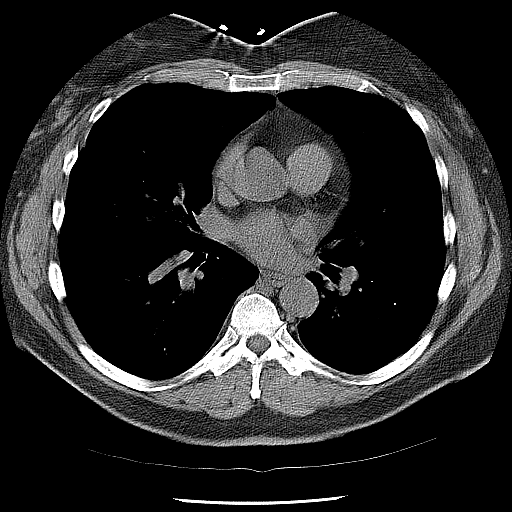
[im 24/27  lung]
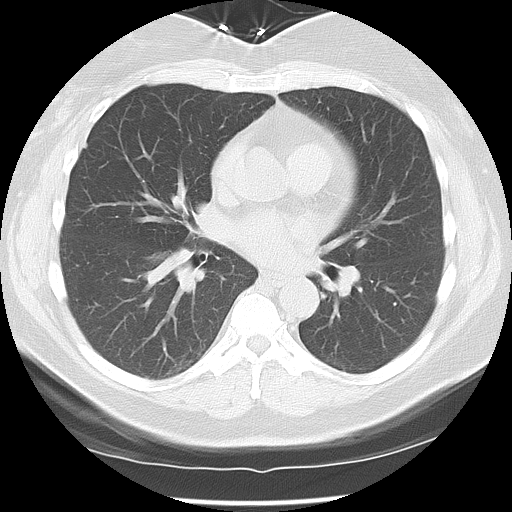
[im 25/27  lung]
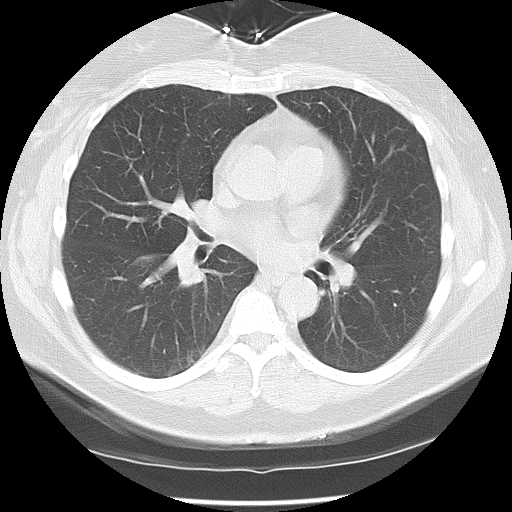
[im 26/27  soft-tissue]
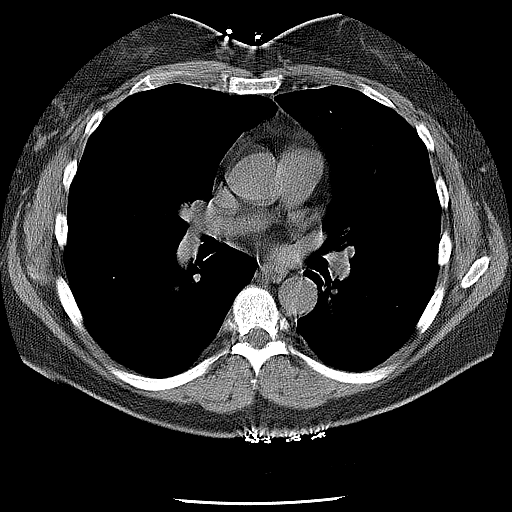
[im 26/27  lung]
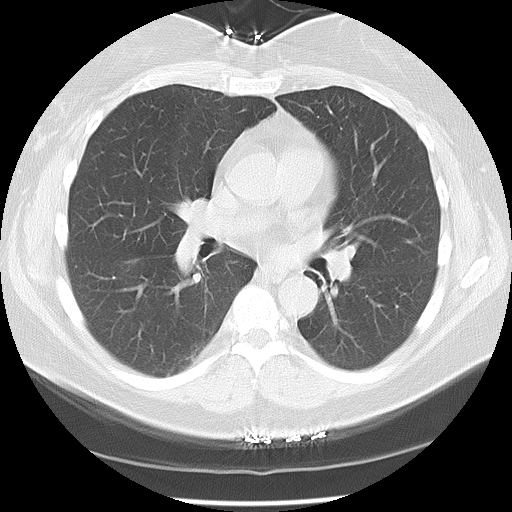

[15 of 31 positions shown; findings below may reference images not displayed]

FINDINGS: Mild right hydronephrosis and perinephric stranding is
seen.  Mild right ureteral dilatation is seen to the level of the
the urinary bladder.  A 3 mm calculus is seen at the right
ureterovesicle junction.

Previous hysterectomy noted.  Diffuse hepatic steatosis is
demonstrated, with focal fatty sparing adjacent to the gallbladder
fossa.  The other abdominal parenchymal organs have a normal
appearance on this noncontrast study.  A small right adrenal mass
is seen which has an attenuation value of 7 HU, consistent with a
benign adrenal adenoma.

No other masses or lymphadenopathy are identified within the
abdomen or pelvis.  No evidence of abscess or free fluid.  No
evidence of dilated bowel loops.
IMPRESSION: 1.  Mild right hydronephrosis and ureterectasis due to 3 mm
calculus at the right UVJ.
2.  Incidentally noted are hepatic steatosis and small benign right
adrenal adenoma.

## 2011-08-31 ENCOUNTER — Other Ambulatory Visit: Payer: Self-pay | Admitting: Internal Medicine

## 2011-08-31 ENCOUNTER — Other Ambulatory Visit: Payer: Self-pay | Admitting: Family Medicine

## 2011-08-31 DIAGNOSIS — I10 Essential (primary) hypertension: Secondary | ICD-10-CM

## 2011-08-31 NOTE — Telephone Encounter (Signed)
Needs ov

## 2011-09-03 ENCOUNTER — Other Ambulatory Visit: Payer: Self-pay

## 2011-09-03 MED ORDER — CLONIDINE HCL 0.1 MG PO TABS: 0.1000 mg | ORAL_TABLET | Freq: Three times a day (TID) | ORAL | Status: DC

## 2011-09-10 ENCOUNTER — Other Ambulatory Visit: Payer: Self-pay | Admitting: Family Medicine

## 2011-09-10 DIAGNOSIS — I10 Essential (primary) hypertension: Secondary | ICD-10-CM

## 2011-09-10 MED ORDER — AMLODIPINE BESYLATE 5 MG PO TABS: 10.0000 mg | ORAL_TABLET | Freq: Every day | ORAL | Status: DC
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
## 2011-09-18 ENCOUNTER — Ambulatory Visit (INDEPENDENT_AMBULATORY_CARE_PROVIDER_SITE_OTHER): Payer: BC Managed Care – PPO | Admitting: Family Medicine

## 2011-09-18 DIAGNOSIS — R05 Cough: Secondary | ICD-10-CM

## 2011-09-18 DIAGNOSIS — Z780 Asymptomatic menopausal state: Secondary | ICD-10-CM

## 2011-09-18 DIAGNOSIS — R059 Cough, unspecified: Secondary | ICD-10-CM

## 2011-09-18 DIAGNOSIS — R6882 Decreased libido: Secondary | ICD-10-CM

## 2011-09-18 DIAGNOSIS — R002 Palpitations: Secondary | ICD-10-CM

## 2011-09-18 LAB — POCT CBC
HCT, POC: 44.9 % (ref 37.7–47.9)
Hemoglobin: 14.7 g/dL (ref 12.2–16.2)
Lymph, poc: 3.4 (ref 0.6–3.4)
MCH, POC: 30.1 pg (ref 27–31.2)
MCHC: 32.7 g/dL (ref 31.8–35.4)
MPV: 10.1 fL (ref 0–99.8)
POC Granulocyte: 4.5 (ref 2–6.9)
POC MID %: 4.8 %M (ref 0–12)
RBC: 4.88 M/uL (ref 4.04–5.48)
WBC: 8.3 10*3/uL (ref 4.6–10.2)

## 2011-09-18 MED ORDER — MOMETASONE FUROATE 50 MCG/ACT NA SUSP: 2.0000 | Freq: Every day | NASAL | Status: DC

## 2011-09-18 MED ORDER — MONTELUKAST SODIUM 10 MG PO TABS: 10.0000 mg | ORAL_TABLET | Freq: Every day | ORAL | Status: DC

## 2011-09-18 NOTE — Progress Notes (Deleted)
  Subjective:    Patient ID: Carrie Reynolds, female    DOB: 09/26/1955, 55 y.o.   MRN: [id number]  HPI    Review of Systems     Objective:   Physical Exam        Assessment & Plan:   

## 2011-09-18 NOTE — Progress Notes (Addendum)
  Subjective:    Patient ID: Carrie Reynolds, female    DOB: [birth year], 56 y.o.   MRN: [id number]  HPI Patient presents in follow of of menopausal symptoms. Patient states initially clonidine effective for hot flashes however benefits have abated. Effexor was helpful as well however patient believes this medication has affected her libido.  Presents with nocturnal cough Chronic PND No chest tightness or wheezing No GERD symptoms  Palpitations today; without CP,SOB or presyncopal symptoms  Review of Systems     Objective:   Physical Exam  Constitutional: She appears well-developed and well-nourished.  HENT:  Head: Atraumatic.  Nose: Mucosal edema and rhinorrhea present.  Mouth/Throat: Posterior oropharyngeal edema: clear postnasal drainage.  Neck: Neck supple.  Cardiovascular: Normal rate, regular rhythm and normal heart sounds.   Pulmonary/Chest: Effort normal and breath sounds normal.  Neurological: She is alert.  Skin: Skin is warm and dry.  Psychiatric: She has a normal mood and affect.      Results for orders placed in visit on 09/18/11  POCT CBC      Component Value Range   WBC 8.3  4.6 - 10.2 (K/uL)   Lymph, poc 3.4  0.6 - 3.4    POC LYMPH PERCENT 40.4  10 - 50 (%L)   MID (cbc) 0.4  0 - 0.9    POC MID % 4.8  0 - 12 (%M)   POC Granulocyte 4.5  2 - 6.9    Granulocyte percent 54.8  37 - 80 (%G)   RBC 4.88  4.04 - 5.48 (M/uL)   Hemoglobin 14.7  12.2 - 16.2 (g/dL)   HCT, POC 40.9  81.1 - 47.9 (%)   MCV 92.1  80 - 97 (fL)   MCH, POC 30.1  27 - 31.2 (pg)   MCHC 32.7  31.8 - 35.4 (g/dL)   RDW, POC 91.4     Platelet Count, POC 261  142 - 424 (K/uL)   MPV 10.1  0 - 99.8 (fL)   EKG Sinus arrythmia     Assessment & Plan:   1. Palpitations  EKG 12-Lead, POCT CBC, TSH  2. Cough  POCT SEDIMENTATION RATE, Nasonex, Singulair  3. Libido, decreased  [facility] formula for testosterone cream   At next OV will slowly wean  patient from Effexor. She is to discontinue Clonidine. Discussed with patient the use of homeopathic agents to treat hot flashes; she will try black cohosh and increase her dietary soy.  Called into [facility] 1% testosterone cream, apply pea size qhs 30 gm

## 2011-09-19 ENCOUNTER — Encounter: Payer: Self-pay | Admitting: Gastroenterology

## 2011-09-19 LAB — TSH: TSH: 2.185 u[IU]/mL (ref 0.350–4.500)

## 2011-09-19 NOTE — Progress Notes (Deleted)
  Subjective:    Patient ID: Carrie Reynolds, female    DOB: Nov 26, 1955, 56 y.o.   MRN: [id number]  HPI    Review of Systems     Objective:   Physical Exam        Assessment & Plan:  Decreased Libido  P/ Called into [facility]- Testosterone cream 1% apply pea size qhs 30gm

## 2011-09-19 NOTE — Progress Notes (Deleted)
  Subjective:    Patient ID: Carrie Reynolds, female    DOB: 10-24-1955, 56 y.o.   MRN: [id number]  HPI    Review of Systems     Objective:   Physical Exam        Assessment & Plan:

## 2011-09-22 ENCOUNTER — Encounter: Payer: Self-pay | Admitting: Family Medicine

## 2011-09-23 NOTE — Progress Notes (Signed)
Quick Note:  Please call patient with normal labs. Thanks! KR  ______ 

## 2011-09-24 ENCOUNTER — Ambulatory Visit: Payer: BC Managed Care – PPO | Admitting: Family Medicine

## 2011-09-26 ENCOUNTER — Other Ambulatory Visit: Payer: Self-pay | Admitting: *Deleted

## 2011-09-26 MED ORDER — CLONIDINE HCL 0.1 MG PO TABS: 0.1000 mg | ORAL_TABLET | Freq: Three times a day (TID) | ORAL | Status: DC

## 2011-10-07 ENCOUNTER — Encounter (HOSPITAL_BASED_OUTPATIENT_CLINIC_OR_DEPARTMENT_OTHER): Payer: Self-pay | Admitting: *Deleted

## 2011-10-07 ENCOUNTER — Emergency Department (HOSPITAL_BASED_OUTPATIENT_CLINIC_OR_DEPARTMENT_OTHER)
Admission: EM | Admit: 2011-10-07 | Discharge: 2011-10-07 | Disposition: A | Discharge status: Home / Self Care | Payer: BC Managed Care – PPO | Attending: Emergency Medicine | Admitting: Emergency Medicine

## 2011-10-07 ENCOUNTER — Telehealth: Payer: Self-pay

## 2011-10-07 DIAGNOSIS — I10 Essential (primary) hypertension: Secondary | ICD-10-CM | POA: Insufficient documentation

## 2011-10-07 DIAGNOSIS — G479 Sleep disorder, unspecified: Secondary | ICD-10-CM | POA: Insufficient documentation

## 2011-10-07 DIAGNOSIS — F341 Dysthymic disorder: Secondary | ICD-10-CM | POA: Insufficient documentation

## 2011-10-07 DIAGNOSIS — F1920 Other psychoactive substance dependence, uncomplicated: Secondary | ICD-10-CM | POA: Insufficient documentation

## 2011-10-07 DIAGNOSIS — F19939 Other psychoactive substance use, unspecified with withdrawal, unspecified: Secondary | ICD-10-CM | POA: Insufficient documentation

## 2011-10-07 DIAGNOSIS — Z79899 Other long term (current) drug therapy: Secondary | ICD-10-CM | POA: Insufficient documentation

## 2011-10-07 HISTORY — DX: Essential (primary) hypertension: I10

## 2011-10-07 MED ORDER — LORAZEPAM 1 MG PO TABS
1.0000 mg | ORAL_TABLET | Freq: Three times a day (TID) | ORAL | Status: AC | PRN
Start: 2011-10-07 — End: 2011-10-17

## 2011-10-07 MED ORDER — VENLAFAXINE HCL ER 75 MG PO CP24: 75.0000 mg | ORAL_CAPSULE | Freq: Every day | ORAL | Status: DC

## 2011-10-07 NOTE — Telephone Encounter (Signed)
Patient called back, states that she has determined cause of her symptoms after she spoke with Dr. Hal Hope on the phone earlier.  She was taking her Effexor XR 75mg  daily until she ran out 3-4 days ago.  Believes her symptoms-- feeling off, crying, etc are from lack of meds/not tapering off.  Patient is unable to RTC today, but can tomorrow.  Should we call in med refill until she comes in tomorrow to discuss further?

## 2011-10-07 NOTE — Telephone Encounter (Signed)
Spoke with patient-- very upset and confused over the phone.  States she feels "off"--can't think and feels off balance.  I advised patient to RTC ASAP for eval, patient unsure if she is able.  I advised if she was feeling this way, she needed eval either by [place] or ER.  If she could not get to [place], to call 911 now!  Pt understood.  Note to MD, FYI.

## 2011-10-07 NOTE — ED Notes (Signed)
MD at bedside. Karen Sofia with pt. 

## 2011-10-07 NOTE — Telephone Encounter (Signed)
Pt called and ask for call back.

## 2011-10-07 NOTE — Telephone Encounter (Signed)
LMOM to CB. 

## 2011-10-07 NOTE — ED Notes (Signed)
Pt ran out of her effexor on Tuesday- since Thursday has been tearful with mood swings and "twinges" in fingers and toes

## 2011-10-07 NOTE — Discharge Instructions (Signed)

## 2011-10-07 NOTE — Telephone Encounter (Signed)
Pt. Has questions about meds she is taking. Pt would like a call back from Dr Hal Hope.

## 2011-10-07 NOTE — ED Provider Notes (Signed)
History     CSN: [id number]  Arrival date & time 10/07/11  4132   First MD Initiated Contact with Patient 10/07/11 1904      Chief Complaint  Patient presents with  . Medication Reaction    (Consider location/radiation/quality/duration/timing/severity/associated sxs/prior treatment) Patient is a 56 y.o. female presenting with anxiety. The history is provided by the patient. No language interpreter was used.  Anxiety This is a new problem. The current episode started in the past 7 days. The problem occurs constantly. The problem has been gradually worsening. The symptoms are aggravated by nothing. She has tried nothing for the symptoms. The treatment provided no relief.  Pt reports she ran out of effexor a week ago.  Pt reports she has been tearful, moody and anxious.  Pt has been taking effexor for hot flashes from menopause.  Pt reports she was going to stop and take Blackcohash instead.    Past Medical History  Diagnosis Date  . Hypertension     Past Surgical History  Procedure Date  . Abdominal hysterectomy 2003    partial- L ovary remains  . Hand surgery     No family history on file.  History  Substance Use Topics  . Smoking status: Former Games developer  . Smokeless tobacco: Never Used  . Alcohol Use: Yes     occasional    OB History    Grav Para Term Preterm Abortions TAB SAB Ect Mult Living                  Review of Systems  Psychiatric/Behavioral: Positive for sleep disturbance and dysphoric mood.  All other systems reviewed and are negative.    Allergies  Amoxicillin; Ampicillin; Cefaclor; Crestor; Lipitor; Penicillins; Simvastatin; and Tetracycline  Home Medications   Current Outpatient Rx  Name Route Sig Dispense Refill  . AMLODIPINE BESYLATE 5 MG PO TABS Oral Take 2 tablets (10 mg total) by mouth daily. 30 tablet 0    NEEDS OFFICE VISIT BEFORE ANY MORE REFILLS  . ARIPIPRAZOLE 10 MG PO TABS Oral Take 10 mg by mouth daily.      [name] CLONIDINE HCL 0.1  MG PO TABS Oral Take 1 tablet (0.1 mg total) by mouth 3 (three) times daily. 90 tablet 0    Needs office visit  . LOSARTAN POTASSIUM-HCTZ 100-12.5 MG PO TABS  TAKE 1 TABLET EVERY DAY 30 tablet 0    NEEDS OFFICE VISIT BEFORE ANYMORE REFILLS  . MOMETASONE FUROATE 50 MCG/ACT NA SUSP Nasal Place 2 sprays into the nose daily. 1 g 12  . MONTELUKAST SODIUM 10 MG PO TABS Oral Take 1 tablet (10 mg total) by mouth at bedtime. 30 tablet 11  . VENLAFAXINE HCL ER 75 MG PO CP24  TAKE ONE CAPSULE EVERY DAY...NEEDS OFFICE VISIT 30 capsule 0    BP 178/104  Pulse 92  Temp(Src) 98.6 F (37 C) (Oral)  Resp 20  SpO2 100%  Physical Exam  Vitals reviewed. Constitutional: She is oriented to person, place, and time. She appears well-developed and well-nourished.  HENT:  Head: Normocephalic and atraumatic.  Right Ear: External ear normal.  Left Ear: External ear normal.  Nose: Nose normal.  Mouth/Throat: Oropharynx is clear and moist.  Eyes: Conjunctivae and EOM are normal. Pupils are equal, round, and reactive to light.  Neck: Normal range of motion. Neck supple.  Cardiovascular: Normal rate and normal heart sounds.   Pulmonary/Chest: Effort normal and breath sounds normal.  Abdominal: Soft. Bowel sounds are normal.  Musculoskeletal: Normal range of motion.  Neurological: She is alert and oriented to person, place, and time. She has normal reflexes.  Skin: Skin is warm.  Psychiatric: She has a normal mood and affect.    ED Course  Procedures (including critical care time)  Labs Reviewed - No data to display No results found.   No diagnosis found.    MDM  Pt given rx for effexor.  Pt advised to see Dr. Hal Hope for recheck this week.       Lonia Skinner [place], [place] 10/07/11 1928

## 2011-10-08 ENCOUNTER — Other Ambulatory Visit: Payer: Self-pay | Admitting: Internal Medicine

## 2011-10-08 NOTE — Telephone Encounter (Signed)
Left message checking on patient.

## 2011-10-08 NOTE — Telephone Encounter (Signed)
Agree with this plan.

## 2011-10-08 NOTE — ED Provider Notes (Signed)
Medical screening examination/treatment/procedure(s) were performed by non-physician practitioner and as supervising physician I was immediately available for consultation/collaboration.   Suzi Roots, MD 10/08/11 1213

## 2011-10-27 ENCOUNTER — Other Ambulatory Visit: Payer: Self-pay | Admitting: Physician Assistant

## 2011-11-18 ENCOUNTER — Telehealth: Payer: Self-pay

## 2011-11-18 MED ORDER — VENLAFAXINE HCL ER 75 MG PO CP24: 75.0000 mg | ORAL_CAPSULE | Freq: Every day | ORAL | Status: DC

## 2011-11-18 NOTE — Telephone Encounter (Signed)
Ok to refill Effexor Xr 75mg  daily?

## 2011-11-18 NOTE — Telephone Encounter (Signed)
PATIENT STATES [place] OUT [place] T[place] AND NEEDS A REFILL ON EFFEXOR. SHE SAID THE LAST TIME SHE DID NOT HAVE IT, SHE ENDED UP IN THE HOSPITAL. PLEASE CALL HER WHEN IT HAS BEEN CALLED INTO THE PHARMACY PLEASE.  BEST PHONE [phone] (CELL)    PHARMACY IS [facility] ([place])  [phone]  LOCATED ON [address] PATIENT WAS ALSO INFORMED TO CALL [facility] IN [place] AND LET THEM KNOW TO SEND A SURE-SCRIPT ALSO.      MBC

## 2011-11-18 NOTE — Telephone Encounter (Signed)
Done and sent in.  Carrie Reynolds 

## 2011-11-18 NOTE — Telephone Encounter (Signed)
[facility] notifying patient rx was sent in.

## 2011-11-29 ENCOUNTER — Other Ambulatory Visit: Payer: Self-pay | Admitting: Family Medicine

## 2011-11-30 ENCOUNTER — Telehealth: Payer: Self-pay

## 2011-11-30 NOTE — Telephone Encounter (Signed)
Pt would like to know why her singular was denied.

## 2011-12-01 NOTE — Telephone Encounter (Signed)
LMOM to CB. 

## 2011-12-01 NOTE — Telephone Encounter (Signed)
Called [facility] because pt had 11 refills from 09/18/11. Spoke with Ronaldo Miyamoto at [facility] and fixed RX.

## 2011-12-15 ENCOUNTER — Other Ambulatory Visit: Payer: Self-pay | Admitting: Family Medicine

## 2012-01-10 ENCOUNTER — Other Ambulatory Visit: Payer: Self-pay | Admitting: Internal Medicine

## 2012-01-10 ENCOUNTER — Other Ambulatory Visit: Payer: Self-pay | Admitting: Physician Assistant

## 2012-01-11 ENCOUNTER — Ambulatory Visit (INDEPENDENT_AMBULATORY_CARE_PROVIDER_SITE_OTHER): Payer: BC Managed Care – PPO | Admitting: Emergency Medicine

## 2012-01-11 VITALS — BP 137/90 | HR 82 | Temp 98.4°F | Resp 16 | Ht 67.0 in | Wt 225.8 lb

## 2012-01-11 DIAGNOSIS — J309 Allergic rhinitis, unspecified: Secondary | ICD-10-CM

## 2012-01-11 DIAGNOSIS — Z9109 Other allergy status, other than to drugs and biological substances: Secondary | ICD-10-CM

## 2012-01-11 DIAGNOSIS — R232 Flushing: Secondary | ICD-10-CM

## 2012-01-11 DIAGNOSIS — R635 Abnormal weight gain: Secondary | ICD-10-CM

## 2012-01-11 DIAGNOSIS — I10 Essential (primary) hypertension: Secondary | ICD-10-CM

## 2012-01-11 DIAGNOSIS — N951 Menopausal and female climacteric states: Secondary | ICD-10-CM

## 2012-01-11 LAB — POCT CBC
Hemoglobin: 14.1 g/dL (ref 12.2–16.2)
Lymph, poc: 3.2 (ref 0.6–3.4)
MCHC: 32.2 g/dL (ref 31.8–35.4)
MID (cbc): 0.6 (ref 0–0.9)
MPV: 9.7 fL (ref 0–99.8)
POC Granulocyte: 7.8 — AB (ref 2–6.9)
POC LYMPH PERCENT: 27.4 %L (ref 10–50)
POC MID %: 5.2 %M (ref 0–12)
Platelet Count, POC: 293 10*3/uL (ref 142–424)
RDW, POC: 14.1 %

## 2012-01-11 MED ORDER — VENLAFAXINE HCL ER 75 MG PO CP24: 75.0000 mg | ORAL_CAPSULE | Freq: Every day | ORAL | Status: DC

## 2012-01-11 MED ORDER — LOSARTAN POTASSIUM-HCTZ 100-12.5 MG PO TABS: 1.0000 | ORAL_TABLET | Freq: Every day | ORAL | Status: DC

## 2012-01-11 MED ORDER — MONTELUKAST SODIUM 10 MG PO TABS: 10.0000 mg | ORAL_TABLET | Freq: Every day | ORAL | Status: DC

## 2012-01-11 NOTE — Progress Notes (Signed)
  Subjective:    Patient ID: Carrie Reynolds, female    DOB: 1956/03/02, 56 y.o.   MRN: [id number]  HPI patient here for recheck she needs refills of her blood pressure medicine or Effexor she takes for hot flashes and her Singulair she uses for asthma    Review of Systems     Objective:   Physical Exam HEENT ENT exam is unremarkable her neck supple chest clear heart regular rate no murmurs abdomen soft no tenderness extremities without        Assessment & Plan:  Routine labs were done today. Effexor Singulair and lisinopril were refill. Check potassium level because she has had some problems with muscle cramps. We did also discuss Arnold-Chiari malformation

## 2012-01-12 LAB — COMPREHENSIVE METABOLIC PANEL
ALT: 17 U/L (ref 0–35)
Alkaline Phosphatase: 55 U/L (ref 39–117)
Sodium: 139 mEq/L (ref 135–145)
Total Bilirubin: 0.4 mg/dL (ref 0.3–1.2)
Total Protein: 6.8 g/dL (ref 6.0–8.3)

## 2012-01-14 ENCOUNTER — Encounter: Payer: Self-pay | Admitting: *Deleted

## 2012-01-18 ENCOUNTER — Ambulatory Visit (INDEPENDENT_AMBULATORY_CARE_PROVIDER_SITE_OTHER): Payer: BC Managed Care – PPO | Admitting: Physician Assistant

## 2012-01-18 VITALS — BP 150/87 | HR 69 | Temp 98.4°F | Resp 16 | Ht 67.0 in | Wt 221.0 lb

## 2012-01-18 DIAGNOSIS — J069 Acute upper respiratory infection, unspecified: Secondary | ICD-10-CM

## 2012-01-18 DIAGNOSIS — R05 Cough: Secondary | ICD-10-CM

## 2012-01-18 MED ORDER — GUAIFENESIN ER 1200 MG PO TB12: 1.0000 | ORAL_TABLET | Freq: Two times a day (BID) | ORAL | Status: DC | PRN

## 2012-01-18 MED ORDER — HYDROCOD POLST-CHLORPHEN POLST 10-8 MG/5ML PO LQCR: 5.0000 mL | Freq: Two times a day (BID) | ORAL | Status: DC | PRN

## 2012-01-18 MED ORDER — IPRATROPIUM BROMIDE 0.03 % NA SOLN: 2.0000 | Freq: Two times a day (BID) | NASAL | Status: DC

## 2012-01-18 NOTE — Patient Instructions (Addendum)
Chiari Malformation Chiari Malformation(CM) causes brain tissue to settle into the spinal canal. There are four types of CM.  Type 1 is most common. It can go unnoticed until problems start, usually with headaches in young adults.   Type 2 is present at birth and always involves a form of spina bifida. Part of the spinal cord pushes through the spine and is exposed. Spina bifida usually causes paralysis of the legs.   Type 3 is more severe because it involves more brain tissue.   Type 4 is the most severe because the brain does not develop correctly.  Adults and adolescents who are unaware they have Type 1 CM may have headaches that are located in the back of the head and get worse with coughing or straining. If more brain tissue is involved, problems may include dizziness, trouble with balance, and vision issues. Diagnosis is made by an MRI. TREATMENT  Babies may need surgery to repair spina bifida. Medications may be used to control pain. Some adults with CM may benefit from surgery for which the goal is to keep the malformation from getting worse. Document Released: 06/29/2002 Document Revised: 06/28/2011 Document Reviewed: 07/06/2008 [facility] Patient Information 2012 [place], [place].  [email or web address]

## 2012-01-18 NOTE — Progress Notes (Signed)
  Subjective:    Patient ID: Carrie Reynolds, female    DOB: 1956-02-16, 56 y.o.   MRN: [id number]  HPI Presents with cough. Recent URI.  Seems to be improving, but cough is not improving.  Cough sets off her HA.  Recent diagnosis of 5 mm Arnold-Chiari malformation.  Review of Systems As above.  No CP, SOB, dizziness, GI/GU symptoms.  Achiness is resolved.    Objective:   Physical Exam Vital signs noted. Well-developed, well nourished WF who is awake, alert and oriented, in NAD. HEENT: [place]/AT, sclera and conjunctiva are clear.  EAC are patent, TMs are normal in appearance. Nasal mucosa is pink and moist. OP is clear. Neck: supple, non-tender, no lymphadenopathy, thyromegaly. Heart: RRR, no murmur Lungs: CTA Skin: warm and dry without rash.     Assessment & Plan:   1. Cough  chlorpheniramine-HYDROcodone (TUSSIONEX PENNKINETIC ER) 10-8 MG/5ML LQCR  2. Acute upper respiratory infections of unspecified site  ipratropium (ATROVENT) 0.03 % nasal spray, Guaifenesin (MUCINEX MAXIMUM STRENGTH) 1200 MG TB12

## 2012-02-07 ENCOUNTER — Ambulatory Visit (INDEPENDENT_AMBULATORY_CARE_PROVIDER_SITE_OTHER): Payer: BC Managed Care – PPO | Admitting: Emergency Medicine

## 2012-02-07 VITALS — BP 160/96 | HR 88 | Temp 98.8°F | Resp 20 | Ht 67.0 in | Wt 225.2 lb

## 2012-02-07 DIAGNOSIS — F329 Major depressive disorder, single episode, unspecified: Secondary | ICD-10-CM

## 2012-02-07 DIAGNOSIS — Q054 Unspecified spina bifida with hydrocephalus: Secondary | ICD-10-CM

## 2012-02-07 DIAGNOSIS — Q0700 Arnold-Chiari syndrome without spina bifida or hydrocephalus: Secondary | ICD-10-CM

## 2012-02-07 DIAGNOSIS — R232 Flushing: Secondary | ICD-10-CM

## 2012-02-07 MED ORDER — VENLAFAXINE HCL ER 150 MG PO CP24: ORAL_CAPSULE | ORAL | Status: DC

## 2012-02-07 NOTE — Progress Notes (Signed)
  Subjective:    Patient ID: Carrie Reynolds, female    DOB: [birth year], 56 y.o.   MRN: [id number]  HPI patient in to discuss her Arnold-Chiari malformation. She's been to the neurologist and [facility] and received a second opinion at [place]. Apparently her cerebellar tonsils 5 mm low and they have not recommended surgery. She states she is still having trouble with headaches and headaches associated with cough. She feels full in her head at all times. She's had memory issues in the past but they have been long-standing    Review of Systems     Objective:   Physical Exam patient is tearful in the room but in no distress her neurological examination cranial nerves intact. Deep tendon reflexes are 1+ and symmetrical. There is no focal weakness on exam.  Her back score is 23 which fits in the moderately depressed realm      Assessment & Plan:  Patient appears depressed to me to get a second opinion from Dr. love regarding her neurological symptom

## 2012-02-08 ENCOUNTER — Emergency Department (HOSPITAL_BASED_OUTPATIENT_CLINIC_OR_DEPARTMENT_OTHER)
Admission: EM | Admit: 2012-02-08 | Discharge: 2012-02-08 | Disposition: A | Discharge status: Home / Self Care | Payer: BC Managed Care – PPO | Attending: Emergency Medicine | Admitting: Emergency Medicine

## 2012-02-08 ENCOUNTER — Encounter (HOSPITAL_BASED_OUTPATIENT_CLINIC_OR_DEPARTMENT_OTHER): Payer: Self-pay | Admitting: *Deleted

## 2012-02-08 ENCOUNTER — Emergency Department (HOSPITAL_BASED_OUTPATIENT_CLINIC_OR_DEPARTMENT_OTHER): Payer: BC Managed Care – PPO

## 2012-02-08 DIAGNOSIS — Z79899 Other long term (current) drug therapy: Secondary | ICD-10-CM | POA: Insufficient documentation

## 2012-02-08 DIAGNOSIS — Y92410 Unspecified street and highway as the place of occurrence of the external cause: Secondary | ICD-10-CM | POA: Insufficient documentation

## 2012-02-08 DIAGNOSIS — T148XXA Other injury of unspecified body region, initial encounter: Secondary | ICD-10-CM

## 2012-02-08 DIAGNOSIS — S0083XA Contusion of other part of head, initial encounter: Secondary | ICD-10-CM | POA: Insufficient documentation

## 2012-02-08 DIAGNOSIS — I10 Essential (primary) hypertension: Secondary | ICD-10-CM | POA: Insufficient documentation

## 2012-02-08 DIAGNOSIS — S0003XA Contusion of scalp, initial encounter: Secondary | ICD-10-CM | POA: Insufficient documentation

## 2012-02-08 DIAGNOSIS — F17200 Nicotine dependence, unspecified, uncomplicated: Secondary | ICD-10-CM | POA: Insufficient documentation
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
## 2012-02-09 NOTE — ED Provider Notes (Signed)
Medical screening examination/treatment/procedure(s) were performed by non-physician practitioner and as supervising physician I was immediately available for consultation/collaboration.   Charles B. Sheldon, MD 02/09/12 1255 

## 2012-03-02 ENCOUNTER — Other Ambulatory Visit: Payer: Self-pay | Admitting: Family Medicine

## 2012-04-01 ENCOUNTER — Ambulatory Visit (INDEPENDENT_AMBULATORY_CARE_PROVIDER_SITE_OTHER): Payer: BC Managed Care – PPO | Admitting: Emergency Medicine

## 2012-04-01 VITALS — BP 144/84 | HR 82 | Temp 98.1°F | Resp 16 | Ht 67.0 in | Wt 228.0 lb

## 2012-04-01 DIAGNOSIS — J329 Chronic sinusitis, unspecified: Secondary | ICD-10-CM

## 2012-04-01 DIAGNOSIS — J209 Acute bronchitis, unspecified: Secondary | ICD-10-CM

## 2012-04-01 DIAGNOSIS — J018 Other acute sinusitis: Secondary | ICD-10-CM

## 2012-04-01 DIAGNOSIS — Z9109 Other allergy status, other than to drugs and biological substances: Secondary | ICD-10-CM

## 2012-04-01 MED ORDER — ALBUTEROL SULFATE HFA 108 (90 BASE) MCG/ACT IN AERS: 2.0000 | INHALATION_SPRAY | RESPIRATORY_TRACT | Status: DC | PRN

## 2012-04-01 MED ORDER — HYDROCOD POLST-CHLORPHEN POLST 10-8 MG/5ML PO LQCR: 5.0000 mL | Freq: Two times a day (BID) | ORAL | Status: DC | PRN

## 2012-04-01 MED ORDER — MONTELUKAST SODIUM 10 MG PO TABS: 10.0000 mg | ORAL_TABLET | Freq: Every day | ORAL | Status: DC

## 2012-04-01 MED ORDER — CEFPROZIL 500 MG PO TABS: 500.0000 mg | ORAL_TABLET | Freq: Two times a day (BID) | ORAL | Status: AC

## 2012-04-01 MED ORDER — PSEUDOEPHEDRINE-GUAIFENESIN ER 60-600 MG PO TB12: 1.0000 | ORAL_TABLET | Freq: Two times a day (BID) | ORAL | Status: DC

## 2012-04-01 NOTE — Progress Notes (Addendum)
Date:  04/01/2012   Name:  Carrie Reynolds   DOB:  07/18/1956   MRN:  [id number] Gender: female Age: 56 y.o.  PCP:  Tonye Pearson, MD    Chief Complaint: Shortness of Breath, Cough and Insomnia   History of Present Illness:  Carrie Reynolds is a 56 y.o. pleasant patient who presents with the following:  Ill since the end of the week when she became ill with nasal congestion, a clear mucoid drainage and a foul tasting post nasal drainage.  Has a purulent cough.  No wheezing or shortness of breath. Fever but no chills, not quantified.  No nausea or vomiting.  No stool change.  No sick contacts.  No improvement with OTC meds.  Patient Active Problem List  Diagnosis  . LEG EDEMA  . Postmenopausal    Past Medical History  Diagnosis Date  . Hypertension   . Arnold-Chiari malformation 2006    Past Surgical History  Procedure Date  . Abdominal hysterectomy 2003    partial- L ovary remains  . Hand surgery   . Bunionectomy     right great toe    History  Substance Use Topics  . Smoking status: Current Some Day Smoker    Types: Cigarettes  . Smokeless tobacco: Never Used  . Alcohol Use: Yes     occasional    No family history on file.  Allergies  Allergen Reactions  . Amoxicillin     REACTION: rash  . Ampicillin     REACTION: rash  . Ceclor (Cefaclor)     REACTION: rash  . Crestor (Rosuvastatin Calcium) Swelling  . Lipitor (Atorvastatin Calcium) Cough  . Penicillins     REACTION: bloating,rash  . Simvastatin Cough  . Tetracycline     REACTION: rash    Medication list has been reviewed and updated.  Current Outpatient Prescriptions on File Prior to Visit  Medication Sig Dispense Refill  . AMITRIPTYLINE HCL PO Take 1 tablet by mouth daily.      [name] amLODipine (NORVASC) 10 MG tablet TAKE 1 TABLET BY MOUTH DAILY  30 tablet  3  . Guaifenesin (MUCINEX MAXIMUM STRENGTH) 1200 MG TB12 Take 1 tablet (1,200 mg total) by mouth every 12 (twelve)  hours as needed.  14 tablet  1  . losartan-hydrochlorothiazide (HYZAAR) 100-12.5 MG per tablet Take 1 tablet by mouth daily. NEEDS OFFICE VISIT FOR REFILLS  30 tablet  11  . mometasone (NASONEX) 50 MCG/ACT nasal spray Place 2 sprays into the nose daily.  1 g  12  . montelukast (SINGULAIR) 10 MG tablet Take 1 tablet (10 mg total) by mouth at bedtime.  30 tablet  11  . venlafaxine XR (EFFEXOR-XR) 150 MG 24 hr capsule Take one tablet daily  30 capsule  11  . chlorpheniramine-HYDROcodone (TUSSIONEX PENNKINETIC ER) 10-8 MG/5ML LQCR Take 5 mLs by mouth every 12 (twelve) hours as needed (cough).  60 mL  0  . ipratropium (ATROVENT) 0.03 % nasal spray Place 2 sprays into the nose 2 (two) times daily.  30 mL  0  . DISCONTD: ARIPiprazole (ABILIFY) 10 MG tablet Take 10 mg by mouth daily.        [name] DISCONTD: cloNIDine (CATAPRES) 0.1 MG tablet Take 1 tablet (0.1 mg total) by mouth 3 (three) times daily.  90 tablet  0    Review of Systems:  As per HPI, otherwise negative.    Physical Examination: Filed Vitals:   04/01/12 1653  BP: 144/84  Pulse: 82  Temp: 98.1 F (36.7 C)  Resp: 16   Filed Vitals:   04/01/12 1653  Height: 5\' 7"  (1.702 m)  Weight: 228 lb (103.42 kg)   Body mass index is 35.71 kg/(m^2). Ideal Body Weight: Weight in (lb) to have BMI = 25: 159.3   GEN: WDWN, NAD, Non-toxic, A & O x 3  No rash or sepsis HEENT: Atraumatic, Normocephalic. Neck supple. No masses, No LAD.  Oropharynx negative Ears and Nose: No external deformity.  TM negative  Nasal drainage green in color. CV: RRR, No M/G/R. No JVD. No thrill. No extra heart sounds. PULM: CTA B, no wheezes, crackles, rhonchi. No retractions. No resp. distress. No accessory muscle use. ABD: S, NT, ND, +BS. No rebound. No HSM. EXTR: No c/c/e NEURO Normal gait.  PSYCH: Normally interactive. Conversant. Not depressed or anxious appearing.  Calm demeanor.    Assessment and Plan: Sinusitis  Bronchitis with bronchospasm Albuterol  MDI cefzil Mucinex d tussionex Follow up as needed  Carmelina Dane, MD I have reviewed and agree with documentation. Robert P. Merla Riches, M.D.

## 2012-04-02 ENCOUNTER — Encounter: Payer: Self-pay | Admitting: Family Medicine

## 2012-04-05 ENCOUNTER — Other Ambulatory Visit: Payer: Self-pay

## 2012-04-05 DIAGNOSIS — F329 Major depressive disorder, single episode, unspecified: Secondary | ICD-10-CM

## 2012-04-05 DIAGNOSIS — R232 Flushing: Secondary | ICD-10-CM

## 2012-04-05 MED ORDER — VENLAFAXINE HCL ER 150 MG PO CP24: ORAL_CAPSULE | ORAL | Status: DC

## 2012-04-16 ENCOUNTER — Encounter: Payer: Self-pay | Admitting: Gastroenterology

## 2012-04-25 ENCOUNTER — Ambulatory Visit (INDEPENDENT_AMBULATORY_CARE_PROVIDER_SITE_OTHER): Payer: BC Managed Care – PPO | Admitting: Physician Assistant

## 2012-04-25 VITALS — BP 140/80 | HR 80 | Temp 98.0°F | Resp 16 | Ht 66.0 in | Wt 229.0 lb

## 2012-04-25 DIAGNOSIS — E7800 Pure hypercholesterolemia, unspecified: Secondary | ICD-10-CM

## 2012-04-25 DIAGNOSIS — I10 Essential (primary) hypertension: Secondary | ICD-10-CM

## 2012-04-25 DIAGNOSIS — R232 Flushing: Secondary | ICD-10-CM

## 2012-04-25 DIAGNOSIS — E669 Obesity, unspecified: Secondary | ICD-10-CM

## 2012-04-25 DIAGNOSIS — F418 Other specified anxiety disorders: Secondary | ICD-10-CM | POA: Insufficient documentation

## 2012-04-25 DIAGNOSIS — F32A Depression, unspecified: Secondary | ICD-10-CM

## 2012-04-25 DIAGNOSIS — Z9109 Other allergy status, other than to drugs and biological substances: Secondary | ICD-10-CM

## 2012-04-25 DIAGNOSIS — J309 Allergic rhinitis, unspecified: Secondary | ICD-10-CM

## 2012-04-25 DIAGNOSIS — G4730 Sleep apnea, unspecified: Secondary | ICD-10-CM

## 2012-04-25 DIAGNOSIS — F329 Major depressive disorder, single episode, unspecified: Secondary | ICD-10-CM

## 2012-04-25 DIAGNOSIS — N951 Menopausal and female climacteric states: Secondary | ICD-10-CM

## 2012-04-25 LAB — COMPREHENSIVE METABOLIC PANEL
CO2: 24 mEq/L (ref 19–32)
Calcium: 10 mg/dL (ref 8.4–10.5)
Chloride: 103 mEq/L (ref 96–112)
Creat: 0.94 mg/dL (ref 0.50–1.10)
Glucose, Bld: 128 mg/dL — ABNORMAL HIGH (ref 70–99)
Total Bilirubin: 0.4 mg/dL (ref 0.3–1.2)
Total Protein: 7 g/dL (ref 6.0–8.3)

## 2012-04-25 LAB — LIPID PANEL
Cholesterol: 265 mg/dL — ABNORMAL HIGH (ref 0–200)
Total CHOL/HDL Ratio: 5.5 Ratio
Triglycerides: 379 mg/dL — ABNORMAL HIGH (ref <150)
VLDL: 76 mg/dL — ABNORMAL HIGH (ref 0–40)

## 2012-04-25 MED ORDER — VENLAFAXINE HCL ER 150 MG PO CP24: ORAL_CAPSULE | ORAL | Status: DC

## 2012-04-25 MED ORDER — AMLODIPINE BESYLATE 10 MG PO TABS: 10.0000 mg | ORAL_TABLET | Freq: Every day | ORAL | Status: DC

## 2012-04-25 MED ORDER — LOSARTAN POTASSIUM-HCTZ 100-12.5 MG PO TABS: 1.0000 | ORAL_TABLET | Freq: Every day | ORAL | Status: DC

## 2012-04-25 MED ORDER — MONTELUKAST SODIUM 10 MG PO TABS: 10.0000 mg | ORAL_TABLET | Freq: Every day | ORAL | Status: DC

## 2012-04-25 MED ORDER — MOMETASONE FUROATE 50 MCG/ACT NA SUSP: 2.0000 | Freq: Every day | NASAL | Status: DC

## 2012-04-25 NOTE — Progress Notes (Signed)
   [address], [place] [address]   Phone [phone]  Subjective:    Patient ID: Carrie Reynolds, female    DOB: 1955/09/24, 56 y.o.   MRN: [id number]  HPI Pt presents to clinic for recheck and medication refills.  She has several issues that she has going on 1-HTN - needs refills 2- hypercholesterol - has not been on medication in a while, has not had blood work done for a while 3- hot flashes 2nd to menopause - seems to be getting worse - the effexor helped tremendously but now her flashes are getting worse.  They are mainly affecting her at night.  She does not have problems with vaginal dryness or other . 4- sleep apnea - just diagnosed - is getting set up with CPAP - they stated they she did not have apenic episodes but did breath very shallow - also had multiple hot flashes during sleep that seemed to disrupt sleep cycle. 5- asthma needs refills of meds  Pt feels like a lot of her current problems are been affected/contributing or being caused by her constant fatigue and sleep problems.  She hopes that with her CPAP she will start to notice a difference in her life.   Review of Systems  Constitutional: Positive for fatigue (falls asleep most afternoons on the cough because she is so fatigued). Negative for activity change and appetite change (eats maybe 1 meal a day).  Genitourinary: Negative for vaginal pain (no problems with vaginal dryness).       Hot flashes  Psychiatric/Behavioral: Positive for disturbed wake/sleep cycle.       Objective:   Physical Exam  Vitals reviewed. Constitutional: She is oriented to person, place, and time. She appears well-developed and well-nourished.  HENT:  Head: Normocephalic and atraumatic.  Right Ear: External ear normal.  Left Ear: External ear normal.  Cardiovascular: Normal rate, regular rhythm and normal heart sounds.   Pulmonary/Chest: Effort normal.  Neurological: She is alert and oriented to person, place, and time.    Skin: Skin is warm and dry.  Psychiatric: She has a normal mood and affect. Her behavior is normal. Judgment and thought content normal.        Assessment & Plan:   1. Hot flashes  venlafaxine XR (EFFEXOR-XR) 150 MG 24 hr capsule  2. Depression  venlafaxine XR (EFFEXOR-XR) 150 MG 24 hr capsule  3. Environmental allergies  montelukast (SINGULAIR) 10 MG tablet, mometasone (NASONEX) 50 MCG/ACT nasal spray  4. Hypertension  losartan-hydrochlorothiazide (HYZAAR) 100-12.5 MG per tablet, amLODipine (NORVASC) 10 MG tablet, Comprehensive metabolic panel, Lipid panel  5. Hypercholesteremia  Comprehensive metabolic panel, Lipid panel  6. Obesity    7. Sleep apnea     1- pt to continue Effexor.  Will add Clonidine 0.1mg  (pt has at home) at bedtime, she can increase to 0.2mg  in 1 wk if she is tolerating and needs more for symptoms control.  She is to carefully monitor her BP, esp after she gets CPAP initiated as I might expected her BP to improve with improved sleep.  I d/w pt that I am not comfortable writing for hormones due to her smoking and cardiac disease risk factors. 2- continue effexor 3- continue medications 4- closely monitor 5- will check labs and expect pt to need treatment after review of her most recent labs in 2011. 6- pt encouraged to eat multiple small meals throughout the day and get some type of exercise daily 7- start CPAP

## 2012-05-03 ENCOUNTER — Other Ambulatory Visit: Payer: Self-pay | Admitting: Family Medicine

## 2012-05-03 NOTE — Addendum Note (Signed)
Addended by: Morrell Riddle on: 05/03/2012 07:09 PM   Modules accepted: Orders

## 2012-05-06 MED ORDER — ROSUVASTATIN CALCIUM 10 MG PO TABS: 10.0000 mg | ORAL_TABLET | Freq: Every day | ORAL | Status: DC

## 2012-05-06 NOTE — Addendum Note (Signed)
Addended by: Morrell Riddle on: 05/06/2012 10:55 AM   Modules accepted: Orders

## 2012-05-14 ENCOUNTER — Ambulatory Visit (INDEPENDENT_AMBULATORY_CARE_PROVIDER_SITE_OTHER): Payer: BC Managed Care – PPO | Admitting: Family Medicine

## 2012-05-14 VITALS — BP 166/83 | HR 85 | Temp 98.3°F | Resp 18 | Ht 67.0 in | Wt 230.0 lb

## 2012-05-14 DIAGNOSIS — J40 Bronchitis, not specified as acute or chronic: Secondary | ICD-10-CM

## 2012-05-14 DIAGNOSIS — B370 Candidal stomatitis: Secondary | ICD-10-CM

## 2012-05-14 MED ORDER — HYDROCOD POLST-CHLORPHEN POLST 10-8 MG/5ML PO LQCR: 5.0000 mL | Freq: Two times a day (BID) | ORAL | Status: DC | PRN

## 2012-05-14 MED ORDER — FLUCONAZOLE 150 MG PO TABS: 150.0000 mg | ORAL_TABLET | Freq: Once | ORAL | Status: DC

## 2012-05-14 NOTE — Patient Instructions (Addendum)

## 2012-05-14 NOTE — Progress Notes (Signed)
@[email or web address] @   Patient ID: Carrie Reynolds MRN: [id number], DOB: [birth year], 56 y.o. Date of Encounter: 05/14/2012, 6:44 PM  Primary Physician: Tonye Pearson, MD  Chief Complaint:  Chief Complaint  Patient presents with  . Cough    1 week    HPI: 56 y.o. year old female presents with a 7 day history of nasal congestion, post nasal drip, sore throat, and cough. Mild sinus pressure. Afebrile. No chills. Nasal congestion thick and green/yellow. Cough is productive of green/yellow sputum and not associated with time of day. Ears feel full, leading to sensation of muffled hearing. Has tried OTC cold preps without success. No GI complaints. Got CPAP machine 2 days ago and her sinuses are getting blocked up.   Fever present Sat and Sunday  No sick contacts, recent antibiotics, or recent travels.   No leg trauma, sedentary periods, h/o cancer, or tobacco use.  Past Medical History  Diagnosis Date  . Hypertension   . Arnold-Chiari malformation 2006     Home Meds: Prior to Admission medications   Medication Sig Start Date End Date Taking? Authorizing Provider  albuterol (PROVENTIL HFA;VENTOLIN HFA) 108 (90 BASE) MCG/ACT inhaler Inhale 2 puffs into the lungs every 4 (four) hours as needed for wheezing (cough, shortness of breath or wheezing.). 04/01/12 04/01/13 Yes Phillips Odor, MD  amLODipine (NORVASC) 10 MG tablet TAKE 1 TABLET BY MOUTH DAILY 05/03/12  Yes Eleanore E Egan, PA-C  Guaifenesin (MUCINEX MAXIMUM STRENGTH) 1200 MG TB12 Take 1 tablet (1,200 mg total) by mouth every 12 (twelve) hours as needed. 01/18/12  Yes Chelle S Jeffery, PA-C  ipratropium (ATROVENT) 0.03 % nasal spray Place 2 sprays into the nose 2 (two) times daily. 01/18/12 01/17/13 Yes Chelle S Jeffery, PA-C  losartan-hydrochlorothiazide (HYZAAR) 100-12.5 MG per tablet Take 1 tablet by mouth daily. 04/25/12  Yes Sarah Harvie Bridge, PA-C  mometasone (NASONEX) 50 MCG/ACT nasal spray Place 2 sprays into the nose daily.  04/25/12 04/25/13 Yes Sarah Harvie Bridge, PA-C  montelukast (SINGULAIR) 10 MG tablet Take 1 tablet (10 mg total) by mouth at bedtime. 04/25/12 04/25/13 Yes Sarah Harvie Bridge, PA-C  rosuvastatin (CRESTOR) 10 MG tablet Take 1 tablet (10 mg total) by mouth daily. 05/06/12  Yes Morrell Riddle, PA-C  venlafaxine XR (EFFEXOR-XR) 150 MG 24 hr capsule Take one tablet daily 04/25/12  Yes Morrell Riddle, PA-C    Allergies:  Allergies  Allergen Reactions  . Crestor (Rosuvastatin Calcium) Swelling  . Lipitor (Atorvastatin Calcium) Cough  . Lisinopril Cough  . Penicillins Swelling  . Simvastatin Cough  . Tetracycline     REACTION: rash    History   Social History  . Marital Status: Married    Spouse Name: N/A    Number of Children: N/A  . Years of Education: N/A   Occupational History  . Not on file.   Social History Main Topics  . Smoking status: Current Every Day Smoker -- 0.1 packs/day for 30 years    Types: Cigarettes  . Smokeless tobacco: Never Used  . Alcohol Use: Yes     occasional  . Drug Use: No  . Sexually Active: Not on file   Other Topics Concern  . Not on file   Social History Narrative  . No narrative on file     Review of Systems: Constitutional: negative for chills, fever, night sweats or weight changes Cardiovascular: negative for chest pain or palpitations Respiratory: negative for hemoptysis, or shortness of breath Abdominal: negative for abdominal pain,  nausea, vomiting or diarrhea Dermatological: negative for rash Neurologic: negative for headache   Physical Exam: Blood pressure 166/83, pulse 85, temperature 98.3 F (36.8 C), temperature source Oral, resp. rate 18, height 5\' 7"  (1.702 m), weight 230 lb (104.327 kg), SpO2 96.00%., Body mass index is 36.02 kg/(m^2). General: Well developed, well nourished, in no acute distress. Head: Normocephalic, atraumatic, eyes without discharge, sclera non-icteric, nares are congested. Bilateral auditory canals clear, TM's are  without perforation, pearly grey with reflective cone of light bilaterally. No sinus TTP. Oral cavity moist, dentition normal. Posterior pharynx with post nasal drip and mild erythema. Marked changes with thrush Neck: Supple. No thyromegaly. Full ROM. No lymphadenopathy. Lungs: Coarse breath sounds bilaterally with wheezes, without rales, or rhonchi. Breathing is unlabored.  Heart: RRR with S1 S2. No murmurs, rubs, or gallops appreciated. Msk:  Strength and tone normal for age. Extremities: No clubbing or cyanosis. No edema. Neuro: Alert and oriented X 3. Moves all extremities spontaneously. CNII-XII grossly in tact. Psych:  Responds to questions appropriately with a normal affect.    ASSESSMENT AND PLAN:  56 y.o. year old female with bronchitis and thrush  1. Bronchitis  chlorpheniramine-HYDROcodone (TUSSIONEX PENNKINETIC ER) 10-8 MG/5ML LQCR  2. Thrush  fluconazole (DIFLUCAN) 150 MG tablet    -Mucinex -Tylenol/Motrin prn -Rest/fluids -RTC precautions -RTC 3-5 days if no improvement  Signed, Elvina Sidle, MD 05/14/2012 6:44 PM

## 2012-05-29 ENCOUNTER — Encounter: Payer: Self-pay | Admitting: Physician Assistant

## 2012-05-29 ENCOUNTER — Ambulatory Visit: Payer: BC Managed Care – PPO

## 2012-05-29 ENCOUNTER — Ambulatory Visit (INDEPENDENT_AMBULATORY_CARE_PROVIDER_SITE_OTHER): Payer: BC Managed Care – PPO | Admitting: Physician Assistant

## 2012-05-29 VITALS — BP 136/82 | HR 77 | Temp 98.0°F | Resp 16 | Ht 67.0 in | Wt 230.0 lb

## 2012-05-29 DIAGNOSIS — R5381 Other malaise: Secondary | ICD-10-CM

## 2012-05-29 DIAGNOSIS — R509 Fever, unspecified: Secondary | ICD-10-CM

## 2012-05-29 DIAGNOSIS — L0291 Cutaneous abscess, unspecified: Secondary | ICD-10-CM

## 2012-05-29 DIAGNOSIS — L0390 Cellulitis, unspecified: Secondary | ICD-10-CM

## 2012-05-29 DIAGNOSIS — R05 Cough: Secondary | ICD-10-CM

## 2012-05-29 LAB — POCT CBC
Granulocyte percent: 64.3 %G (ref 37–80)
HCT, POC: 44.7 % (ref 37.7–47.9)
MCHC: 31.1 g/dL — AB (ref 31.8–35.4)
MPV: 10 fL (ref 0–99.8)
POC Granulocyte: 6 (ref 2–6.9)
POC LYMPH PERCENT: 29.3 %L (ref 10–50)
POC MID %: 6.4 %M (ref 0–12)
RDW, POC: 14.3 %

## 2012-05-29 MED ORDER — BECLOMETHASONE DIPROPIONATE 80 MCG/ACT IN AERS: 2.0000 | INHALATION_SPRAY | Freq: Two times a day (BID) | RESPIRATORY_TRACT | Status: DC

## 2012-05-29 MED ORDER — DOXYCYCLINE HYCLATE 100 MG PO CAPS: 100.0000 mg | ORAL_CAPSULE | Freq: Two times a day (BID) | ORAL | Status: DC

## 2012-05-29 NOTE — Progress Notes (Signed)
Subjective:    Patient ID: Carrie Reynolds, female    DOB: Dec 27, 1955, 56 y.o.   MRN: [id number]  HPI This 56 y.o. female presents for evaluation of tender lump in the LEFT armpit on Sunday 05/25/2012.  Pain woke her from sleep last night. Intermittent fever, chills x 3 weeks. She quit smoking 34 days ago, and is using an albuterol inhaler 4 times daily. Reports a cough, most days, intermittently worse, since 07/2011.  "I'm tired.  And I'm tired of it."  Has been treated for bronchitis and/or cough 3 times since 04/11/2012.  She has chronic allergies, and is on maximum treatment other than injections.  Review of Systems As above.  No GU/GI symptoms.   Past Medical History  Diagnosis Date  . Hypertension   . Arnold-Chiari malformation 2006    Past Surgical History  Procedure Date  . Abdominal hysterectomy 2003    partial- L ovary remains  . Hand surgery   . Bunionectomy     right great toe    Prior to Admission medications   Medication Sig Start Date End Date Taking? Authorizing Provider  albuterol (PROVENTIL HFA;VENTOLIN HFA) 108 (90 BASE) MCG/ACT inhaler Inhale 2 puffs into the lungs every 4 (four) hours as needed for wheezing (cough, shortness of breath or wheezing.). 04/01/12 04/01/13 Yes Phillips Odor, MD  amLODipine (NORVASC) 10 MG tablet TAKE 1 TABLET BY MOUTH DAILY 05/03/12  Yes Eleanore Delia Chimes, PA-C  chlorpheniramine-HYDROcodone (TUSSIONEX PENNKINETIC ER) 10-8 MG/5ML LQCR Take 5 mLs by mouth every 12 (twelve) hours as needed. 05/14/12  Yes Elvina Sidle, MD  Guaifenesin [facility] MAXIMUM STRENGTH) 1200 MG TB12 Take 1 tablet (1,200 mg total) by mouth every 12 (twelve) hours as needed. 01/18/12  Yes Ezel Vallone S Marbeth Smedley, PA-C  ipratropium (ATROVENT) 0.03 % nasal spray Place 2 sprays into the nose 2 (two) times daily. 01/18/12 01/17/13 Yes Dezire Turk S Montrelle Eddings, PA-C  losartan-hydrochlorothiazide (HYZAAR) 100-12.5 MG per tablet Take 1 tablet by mouth daily. 04/25/12  Yes Sarah Harvie Bridge, PA-C  mometasone (NASONEX) 50 MCG/ACT nasal spray Place 2 sprays into the nose daily. 04/25/12 04/25/13 Yes Sarah Harvie Bridge, PA-C  montelukast (SINGULAIR) 10 MG tablet Take 1 tablet (10 mg total) by mouth at bedtime. 04/25/12 04/25/13 Yes Sarah Harvie Bridge, PA-C  rosuvastatin (CRESTOR) 10 MG tablet Take 1 tablet (10 mg total) by mouth daily. 05/06/12  Yes Morrell Riddle, PA-C  venlafaxine XR (EFFEXOR-XR) 150 MG 24 hr capsule Take one tablet daily 04/25/12  Yes Morrell Riddle, PA-C    Allergies  Allergen Reactions  . Crestor (Rosuvastatin Calcium) Swelling  . Lipitor (Atorvastatin Calcium) Cough  . Lisinopril Cough  . Penicillins Swelling  . Simvastatin Cough  . Tetracycline     REACTION: rash    History   Social History  . Marital Status: Married    Spouse Name: Amarachukwu Lakatos    Number of Children: 2  . Years of Education: 12   Occupational History  . 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 Smoking status: Former Smoker -- 0.1 packs/day for 30 years    Types: Cigarettes    Quit date: 04/26/2012  . Smokeless tobacco: Never Used  . Alcohol Use: Yes     Comment: occasional  . Drug Use: No  . Sexually Active: Yes -- Female partner(s)    Birth Control/ Protection: Surgical   Other Topics Concern  . Not on file   Social History Narrative  Lives with husband.    Family History  Problem Relation Age of Onset  . Cancer Father     lung  . Alzheimer's disease Father   . Alzheimer's disease Paternal Grandmother   . Cancer Paternal Grandfather     lung       Objective:   Physical Exam Blood pressure 136/82, pulse 77, temperature 98 F (36.7 C), temperature source Oral, resp. rate 16, height 5\' 7"  (1.702 m), weight 230 lb (104.327 kg), SpO2 98.00%. Body mass index is 36.02 kg/(m^2). Well-developed, well nourished WF who is awake, alert and oriented, in NAD. HEENT: [place]/AT, PERRL, EOMI.  Sclera and conjunctiva are clear.  EAC are patent, TMs are normal  in appearance. Nasal mucosa is pink and moist, but congested. OP is clear. Neck: supple, non-tender, no lymphadenopathy, thyromegaly. Heart: RRR, no murmur Lungs: normal effort, CTA Extremities: no cyanosis, clubbing or edema. Skin: warm and dry without rash. Psychologic: good mood and appropriate affect, normal speech and behavior.    Results for orders placed in visit on 05/29/12  POCT CBC      Component Value Range   WBC 9.3  4.6 - 10.2 K/uL   Lymph, poc 2.7  0.6 - 3.4   POC LYMPH PERCENT 29.3  10 - 50 %L   MID (cbc) 0.6  0 - 0.9   POC MID % 6.4  0 - 12 %M   POC Granulocyte 6.0  2 - 6.9   Granulocyte percent 64.3  37 - 80 %G   RBC 4.63  4.04 - 5.48 M/uL   Hemoglobin 13.9  12.2 - 16.2 g/dL   HCT, POC 16.1  09.6 - 47.9 %   MCV 96.5  80 - 97 fL   MCH, POC 30.0  27 - 31.2 pg   MCHC 31.1 (*) 31.8 - 35.4 g/dL   RDW, POC 04.5     Platelet Count, POC 273  142 - 424 K/uL   MPV 10.0  0 - 99.8 fL   CXR: [facility] reading (PRIMARY) by  Dr. Patsy Lager.  No acute process. Normal CXR.      Assessment & Plan:   1. Cough  DG Chest 2 View, beclomethasone (QVAR) 80 MCG/ACT inhaler  2. Fever    3. Malaise and fatigue  POCT CBC  4. Cellulitis  doxycycline (VIBRAMYCIN) 100 MG capsule   RTC in 4 weeks.  Expect to see reduced need for albuterol inhaler.  Patient Instructions  Get plenty of rest and drink at least 64 ounces of water daily. Continue the ibuprofen and warm compresses to the tender lump in the armpit.  If the lump persists, or worsens, return for re-evaluation.

## 2012-05-29 NOTE — Patient Instructions (Signed)
Get plenty of rest and drink at least 64 ounces of water daily. Continue the ibuprofen and warm compresses to the tender lump in the armpit.  If the lump persists, or worsens, return for re-evaluation.

## 2012-06-02 NOTE — Progress Notes (Signed)
Left msg for pt to make a 4 week f-up appt with Chelle. Lennon Alstrom

## 2012-06-04 NOTE — Progress Notes (Signed)
Follow up appt made for 06/26/12 with Chelle. Lennon Alstrom

## 2012-06-06 ENCOUNTER — Telehealth: Payer: Self-pay

## 2012-06-06 NOTE — Telephone Encounter (Signed)
Patient notified and voiced understanding. She wanted her CBC result from 05/29/12

## 2012-06-06 NOTE — Telephone Encounter (Signed)
Pt calling for lab results  bf

## 2012-06-26 ENCOUNTER — Ambulatory Visit: Payer: BC Managed Care – PPO | Admitting: Physician Assistant

## 2012-08-23 ENCOUNTER — Other Ambulatory Visit: Payer: Self-pay | Admitting: Physician Assistant

## 2012-09-06 ENCOUNTER — Other Ambulatory Visit: Payer: Self-pay | Admitting: Physician Assistant

## 2012-09-17 ENCOUNTER — Ambulatory Visit (INDEPENDENT_AMBULATORY_CARE_PROVIDER_SITE_OTHER): Payer: BC Managed Care – PPO | Admitting: Physician Assistant

## 2012-09-17 VITALS — BP 158/96 | HR 82 | Temp 97.9°F | Resp 18 | Ht 67.0 in | Wt 232.2 lb

## 2012-09-17 DIAGNOSIS — R590 Localized enlarged lymph nodes: Secondary | ICD-10-CM

## 2012-09-17 DIAGNOSIS — R599 Enlarged lymph nodes, unspecified: Secondary | ICD-10-CM

## 2012-09-17 DIAGNOSIS — Z1239 Encounter for other screening for malignant neoplasm of breast: Secondary | ICD-10-CM

## 2012-09-17 LAB — COMPREHENSIVE METABOLIC PANEL
ALT: 26 U/L (ref 0–35)
Albumin: 4.4 g/dL (ref 3.5–5.2)
BUN: 13 mg/dL (ref 6–23)
CO2: 26 mEq/L (ref 19–32)
Calcium: 10.2 mg/dL (ref 8.4–10.5)
Chloride: 101 mEq/L (ref 96–112)
Creat: 0.81 mg/dL (ref 0.50–1.10)

## 2012-09-17 LAB — POCT CBC
HCT, POC: 46.8 % (ref 37.7–47.9)
Hemoglobin: 14.9 g/dL (ref 12.2–16.2)
MCH, POC: 29.9 pg (ref 27–31.2)
MCV: 93.8 fL (ref 80–97)
RBC: 4.99 M/uL (ref 4.04–5.48)
WBC: 8.8 10*3/uL (ref 4.6–10.2)

## 2012-09-17 LAB — LACTATE DEHYDROGENASE: LDH: 141 U/L (ref 94–250)

## 2012-09-17 NOTE — Patient Instructions (Addendum)
Your CBC looks normal today.  I am hesitant to start you on antibiotics if we don't know what we're treating.  I have sent a BUNCH of other labs out to check liver, kidney, electrolytes, viral illnesses, tick borne illness, so let's wait and see what this stuff shows.  I will hopefully have most of this back this week.  Hopefully that will yield some answers.  If not we may need to pursue evaluation with a specialist and/or a lymph node biopsy.  I have put in an order for your mammogram as well, so you should get a call about scheduling this.  Please let me know if anything is worsening or if you develop new symptoms.

## 2012-09-17 NOTE — Progress Notes (Signed)
Subjective:    Patient ID: Carrie Reynolds, female    DOB: [birth year], 56 y.o.   MRN: [id number]  HPI    Carrie Reynolds is a very pleasant 57 yr old female here with concern about axillary lymphadenopathy.  Carrie Reynolds was seen here in Nov 2013 (almost 4 months ago) with complaint of left sided axillary lymphadenopathy.  At that time Carrie Reynolds was treated with doxy and the issue seemed to resolve.  Carrie Reynolds states Carrie Reynolds was quite sick throughout the fall with "the crud", cold and sinus symptoms that Carrie Reynolds just couldn't shake.  Feels like this has mostly resolved now.  Though the doxy seemed to resolve the adenopathy at the time, Carrie Reynolds has noticed that it "comes and goes" still.  Carrie Reynolds has just kind of dealt with it up until this past week when Carrie Reynolds feels like it has worsened.  Now with painful bilateral lymphadenopathy.  Feels like it is worse in the mornings, can hardly put her arms down because it is so tender.  No skin changes, no drainage.  Definitely feels like this is lymph nodes and not pimples or abscesses.  Has not noticed any other adenopathy.  Denies fever or chills.  Denies weight loss.  States Carrie Reynolds's actually gained about 25 pounds in the last year.  Occasional night sweats that Carrie Reynolds attributes to hot flashes/menopause.  No change in appetite.  No known injuries.  Denies bleeding, but states Carrie Reynolds has noticed that occasionally Carrie Reynolds gets small bruises on her abdomen out of nowhere, but that these resolve on their own.  Carrie Reynolds does do self breast exams, has not noticed anything concerning.  Last mammogram 3 yrs ago.  Carrie Reynolds quit smoking in Oct 2013.  Notes that about 2 days ago Carrie Reynolds had an itchy rash on her face, but it resolved on it's own.  No other associated symptoms that Carrie Reynolds can think of.  Nothing seems to make symptoms better or worse  CBC and CXR at last visit (Nov 2013) both normal.  Review of Systems  Constitutional: Negative for fever, chills, diaphoresis, activity change, appetite change, fatigue and unexpected  weight change.  HENT: Negative.   Respiratory: Negative.   Cardiovascular: Negative.   Gastrointestinal: Negative.   Musculoskeletal: Positive for back pain. Negative for myalgias, joint swelling and arthralgias.  Skin: Positive for rash (face, two days ago).  Neurological: Negative.   Hematological: Positive for adenopathy (axillary).       Objective:   Physical Exam  Vitals reviewed. Constitutional: Carrie Reynolds is oriented to person, place, and time. Carrie Reynolds appears well-developed and well-nourished. No distress.  HENT:  Head: Normocephalic and atraumatic.  Right Ear: Tympanic membrane, external ear and ear canal normal.  Left Ear: Tympanic membrane and external ear normal.  Nose: Nose normal.  Mouth/Throat: Uvula is midline, oropharynx is clear and moist and mucous membranes are normal.  Left EAC erythematous but not swollen and without debris  Eyes: Conjunctivae are normal. Pupils are equal, round, and reactive to light. No scleral icterus.  Neck: Neck supple.  Cardiovascular: Normal rate, regular rhythm and normal heart sounds.   Pulmonary/Chest: Effort normal and breath sounds normal. Carrie Reynolds has no wheezes. Carrie Reynolds has no rales. Right breast exhibits no inverted nipple, no mass, no nipple discharge, no skin change and no tenderness. Left breast exhibits no inverted nipple, no mass, no nipple discharge, no skin change and no tenderness. Breasts are symmetrical.  Abdominal: Soft. Bowel sounds are normal. Carrie Reynolds exhibits no distension and no mass. There is no tenderness.  There is no rebound and no guarding.  Musculoskeletal: Carrie Reynolds exhibits no edema.  Lymphadenopathy:       Head (left side): Submandibular adenopathy present.    Carrie Reynolds has cervical adenopathy.       Right cervical: Superficial cervical and posterior cervical adenopathy present.       Left cervical: Superficial cervical and posterior cervical adenopathy present.    Carrie Reynolds has axillary adenopathy.       Right axillary: Lateral adenopathy  present.       Left axillary: Lateral adenopathy present.       Right: No inguinal, no supraclavicular and no epitrochlear adenopathy present.       Left: No inguinal, no supraclavicular and no epitrochlear adenopathy present.  Approx 1cm tender Left submandibular node; two appox 1cm tender right axillary nodes; one approx 1cm very tender left axillary node  Neurological: Carrie Reynolds is alert and oriented to person, place, and time.  Skin: Skin is warm and dry.  Psychiatric: Carrie Reynolds has a normal mood and affect. Her behavior is normal.      Filed Vitals:   09/17/12 1524  BP: 158/96  Pulse: 82  Temp: 97.9 F (36.6 C)  Resp: 18     Results for orders placed in visit on 09/17/12  POCT CBC      Result Value Range   WBC 8.8  4.6 - 10.2 K/uL   Lymph, poc 2.9  0.6 - 3.4   POC LYMPH PERCENT 33.2  10 - 50 %L   MID (cbc) 0.5  0 - 0.9   POC MID % 5.9  0 - 12 %M   POC Granulocyte 5.4  2 - 6.9   Granulocyte percent 60.9  37 - 80 %G   RBC 4.99  4.04 - 5.48 M/uL   Hemoglobin 14.9  12.2 - 16.2 g/dL   HCT, POC 95.6  21.3 - 47.9 %   MCV 93.8  80 - 97 fL   MCH, POC 29.9  27 - 31.2 pg   MCHC 31.8  31.8 - 35.4 g/dL   RDW, POC 08.6     Platelet Count, POC 260  142 - 424 K/uL   MPV 9.7  0 - 99.8 fL      Assessment & Plan:  Lymphadenopathy, axillary - Plan: POCT CBC, Comprehensive metabolic panel, Epstein-Barr virus VCA antibody panel, HIV antibody, B. burgdorfi antibodies, [place] spotted fvr ab, IgM-blood, CMV IgM, Cytomegalovirus antibody, IgG, Ehrlichia antibody panel, Lactate dehydrogenase, Pathologist smear review, MM Digital Screening, RPR  Lymphadenopathy, cervical - Plan: POCT CBC, Comprehensive metabolic panel, Epstein-Barr virus VCA antibody panel, HIV antibody, B. burgdorfi antibodies, [place] spotted fvr ab, IgM-blood, CMV IgM, Cytomegalovirus antibody, IgG, Ehrlichia antibody panel, Lactate dehydrogenase, Pathologist smear review, RPR  Screening for breast cancer - Plan: MM Digital  Screening   Carrie Reynolds is a very pleasant 57 yr old female with bilateral axillary lymphadenopathy and cervical lymphadenopathy.  This has been present off and on for about the last 4 months, worse this week.  CBC today is normal.  Aside from lymphadenopathy, exam is normal.  Enlarged nodes are all approx 1 cm, mobile, and tender.  Carrie Reynolds has no other associated symptoms.  In the past, this resolved with a course of doxycycline, but I am hesitant to start antibiotics when I'm not sure what we're treating, and Carrie Reynolds agrees with this.  So I have sent a bunch of labs off today - CMP, EBV, CMV, HIV, RPR, Lyme, RMSF, Ehrlichia, LDH, peripheral  smear.  Hopefully this will yield some answers.  If not, we may need to refer for further evaluation and/or proceed with biopsy.  I have discussed all this with the patient who understands and is in agreement with this work up.  Additionally I have put in an order for a mammogram as Carrie Reynolds has not had one in 3 yrs, and malignancy is certainly on the differential for axillary adenopathy.

## 2012-09-18 LAB — PATHOLOGIST SMEAR REVIEW

## 2012-09-18 LAB — EHRLICHIA ANTIBODY PANEL
E chaffeensis (HGE) Ab, IgG: NEGATIVE
E chaffeensis (HGE) Ab, IgM: NEGATIVE

## 2012-09-18 LAB — EPSTEIN-BARR VIRUS VCA ANTIBODY PANEL
EBV EA IgG: <5 U/mL (ref <9.0)
EBV NA IgG: 55.4 U/mL — ABNORMAL HIGH (ref <18.0)
EBV VCA IgG: 228 U/mL — ABNORMAL HIGH (ref <18.0)

## 2012-09-18 LAB — RPR

## 2012-09-19 ENCOUNTER — Encounter: Payer: Self-pay | Admitting: Physician Assistant

## 2012-09-19 LAB — CMV IGM: CMV IgM: <8 AU/mL (ref <30.00)

## 2012-09-22 ENCOUNTER — Encounter: Payer: Self-pay | Admitting: Physician Assistant

## 2012-09-22 ENCOUNTER — Telehealth: Payer: Self-pay | Admitting: Physician Assistant

## 2012-09-22 NOTE — Telephone Encounter (Signed)
Hi Carrie Reynolds - I placed an order for a mammogram for this pt last Weds 09/17/12, and she emailed me to see she hasn't heard anything about it yet.  Just wanted to check and make sure it was in the works.  If I need to do anything, let me know!  Thanks for your help!

## 2012-09-24 ENCOUNTER — Other Ambulatory Visit: Payer: Self-pay | Admitting: Radiology

## 2012-09-24 DIAGNOSIS — R590 Localized enlarged lymph nodes: Secondary | ICD-10-CM

## 2012-10-03 ENCOUNTER — Ambulatory Visit
Admission: RE | Admit: 2012-10-03 | Discharge: 2012-10-03 | Disposition: A | Discharge status: Home / Self Care | Payer: BC Managed Care – PPO | Source: Ambulatory Visit | Attending: Physician Assistant | Admitting: Physician Assistant

## 2012-10-03 ENCOUNTER — Telehealth: Payer: Self-pay | Admitting: Physician Assistant

## 2012-10-03 ENCOUNTER — Other Ambulatory Visit: Payer: Self-pay | Admitting: Physician Assistant

## 2012-10-03 DIAGNOSIS — R590 Localized enlarged lymph nodes: Secondary | ICD-10-CM

## 2012-10-03 NOTE — Telephone Encounter (Signed)
Called pt to discuss mammogram/ultrasound results and to see how she is doing.  LMTCB

## 2012-10-22 ENCOUNTER — Other Ambulatory Visit: Payer: Self-pay | Admitting: Physician Assistant

## 2012-11-10 ENCOUNTER — Other Ambulatory Visit: Payer: Self-pay | Admitting: Family Medicine

## 2012-12-06 ENCOUNTER — Ambulatory Visit (INDEPENDENT_AMBULATORY_CARE_PROVIDER_SITE_OTHER): Payer: BC Managed Care – PPO | Admitting: Emergency Medicine

## 2012-12-06 VITALS — BP 152/100 | HR 93 | Temp 98.8°F | Resp 16 | Ht 67.5 in | Wt 233.2 lb

## 2012-12-06 DIAGNOSIS — B370 Candidal stomatitis: Secondary | ICD-10-CM

## 2012-12-06 DIAGNOSIS — J40 Bronchitis, not specified as acute or chronic: Secondary | ICD-10-CM

## 2012-12-06 DIAGNOSIS — J309 Allergic rhinitis, unspecified: Secondary | ICD-10-CM

## 2012-12-06 MED ORDER — HYDROCOD POLST-CHLORPHEN POLST 10-8 MG/5ML PO LQCR: 5.0000 mL | Freq: Two times a day (BID) | ORAL | Status: DC | PRN

## 2012-12-06 MED ORDER — PSEUDOEPHEDRINE-GUAIFENESIN ER 60-600 MG PO TB12: 1.0000 | ORAL_TABLET | Freq: Two times a day (BID) | ORAL | Status: DC

## 2012-12-06 MED ORDER — FLUCONAZOLE 150 MG PO TABS: 150.0000 mg | ORAL_TABLET | Freq: Once | ORAL | Status: DC

## 2012-12-06 NOTE — Patient Instructions (Addendum)
Allergic Rhinitis Allergic rhinitis is when the mucous membranes in the nose respond to allergens. Allergens are particles in the air that cause your body to have an allergic reaction. This causes you to release allergic antibodies. Through a chain of events, these eventually cause you to release histamine into the blood stream (hence the use of antihistamines). Although meant to be protective to the body, it is this release that causes your discomfort, such as frequent sneezing, congestion and an itchy runny nose.  CAUSES  The pollen allergens may come from grasses, trees, and weeds. This is seasonal allergic rhinitis, or "hay fever." Other allergens cause year-round allergic rhinitis (perennial allergic rhinitis) such as house dust mite allergen, pet dander and mold spores.  SYMPTOMS   Nasal stuffiness (congestion).  Runny, itchy nose with sneezing and tearing of the eyes.  There is often an itching of the mouth, eyes and ears. It cannot be cured, but it can be controlled with medications. DIAGNOSIS  If you are unable to determine the offending allergen, skin or blood testing may find it. TREATMENT   Avoid the allergen.  Medications and allergy shots (immunotherapy) can help.  Hay fever may often be treated with antihistamines in pill or nasal spray forms. Antihistamines block the effects of histamine. There are over-the-counter medicines that may help with nasal congestion and swelling around the eyes. Check with your caregiver before taking or giving this medicine. If the treatment above does not work, there are many new medications your caregiver can prescribe. Stronger medications may be used if initial measures are ineffective. Desensitizing injections can be used if medications and avoidance fails. Desensitization is when a patient is given ongoing shots until the body becomes less sensitive to the allergen. Make sure you follow up with your caregiver if problems continue. SEEK MEDICAL  CARE IF:   You develop fever (more than 100.5 F (38.1 C).  You develop a cough that does not stop easily (persistent).  You have shortness of breath.  You start wheezing.  Symptoms interfere with normal daily activities. Document Released: 04/03/2001 Document Revised: 10/01/2011 Document Reviewed: 10/13/2008 [facility] Patient Information 2013 [place], [place]. Thrush, Adult  Ginette Pitman is a yeast infection that develops in the mouth and throat and on the tongue. The medical term for this is oropharyngeal candidiasis, or OPC. Ginette Pitman is most common in older adults, but it can occur at any age. Ginette Pitman occurs when a yeast called candida grows out of control. Candida normally is present in small amounts in the mouth and on other mucous membranes. However, under certain circumstances, candida can grow rapidly, causing thrush. Ginette Pitman can be a recurring problem for people who have chronic illnesses or who take medications that limit the body's ability to fight infection (weakened immune system). Since these people have difficulty fighting infections, the fungus that causes thrush can spread throughout the body. This can cause life-threatening blood or organ infections. CAUSES  Candida, the yeast that causes thrush, is normally present in small amounts in the mouth and on other mucous membranes. It usually causes no harm. However, when conditions are present that allow the yeast to grow uncontrolled, it invades surrounding tissues and becomes an infection. Ginette Pitman is most commonly caused by the yeast Candida albicans. Less often, other forms of candida can lead to thrush. There are many types of bacteria in your mouth that normally control the growth of candida. Sometimes a new type of bacteria gets into your mouth and disrupts the balance of the germs already  there. This can allow candida to overgrow. Other factors that increase your risk of developing thrush include:  An impaired ability to fight infection  (weakened immune system). A normal immune system is usually strong enough to prevent candida from overgrowing.  Older adults are more likely to develop thrush because they may have weaker immune systems.  People with human immunodeficiency virus (HIV) infection have a high likelihood of developing thrush. About 90% of people with HIV develop thrush at some point during the course of their disease.  People with diabetes are more likely to get thrush because high blood sugar levels promote overgrowth of the candida fungus.  A dry mouth (xerostomia). Dry mouth can result from overuse of mouthwashes or from certain conditions such as Sjgren's syndrome.  Pregnancy. Hormone changes during pregnancy can lead to thrush by altering the balance of bacteria in the mouth.  Poor dental care, especially in people who have false teeth.  The use of antibiotic medications. This may lead to thrush by changing the balance of bacteria in the mouth. SYMPTOMS  Thrush can be a mild infection that causes no symptoms. If symptoms develop, they may include the following:  A burning feeling in the mouth and throat. This can occur at the start of a thrush infection.  White patches that adhere to the mouth and tongue. The tissue around the patches may be red, raw, and painful. If rubbed (during tooth brushing, for example), the patches and the tissue of the mouth may bleed easily.  A bad taste in the mouth or difficulty tasting foods.  Cottony feeling in the mouth.  Sometimes pain during eating and swallowing. DIAGNOSIS  Your caregiver can usually diagnose thrush by exam. In addition to looking in your mouth, your caregiver will ask you questions about your health. TREATMENT  Medications that help prevent the growth of fungi (antifungals) are the standard treatment for thrush. These medications are either applied directly to the affected area (topical) or swallowed (oral). Mild thrush In adults, mild cases of  thrush may clear up with simple treatment that can be done at home. This treatment usually involves using an antifungal mouth rinse or lozenges. Treatment usually lasts about 14 days. Moderate to severe thrush  More severe thrush infections that have spread to the esophagus are treated with an oral antifungal medication. A topical antifungal medication may also be used.  For some severe infections, a treatment period longer than 14 days may be needed.  Oral antifungal medications are almost never used during pregnancy because the fetus may be harmed. However, if a pregnant woman has a rare, severe thrush infection that has spread to her blood, oral antifungal medications may be used. In this case, the risk of harm to the mother and fetus from the severe thrush infection may be greater than the risk posed by the use of antifungal medications. Persistent or recurrent thrush Persistent (does not go away) or recurrent (keeps coming back) cases of thrush may:  Need to be treated twice as long as the symptoms last.  Require treatment with both oral and topical antifungal medications.  People with weakened immune systems can take an antifungal medication on a continuous basis to prevent thrush infections. It is important to treat conditions that make you more likely to get thrush, such as diabetes, human immunodeficiency virus (HIV), or cancer.  HOME CARE INSTRUCTIONS   If you are breast-feeding, you should clean your nipples with an antifungal medication, such as nystatin (Mycostatin). Dry your nipples  after breast-feeding. Applying lanolin-containing body lotion may help relieve nipple soreness.  If you wear dentures and get thrush, remove dentures before going to bed, brush them vigorously, and soak in a solution of chlorhexidine gluconate or a product such as Polident or Efferdent.  Eating plain, unflavored yogurt that contains live cultures (check the label) can also help cure thrush. Yogurt  helps healthy bacteria grow in the mouth. These bacteria stop the growth of the yeast that causes thrush.  Adults can treat thrush at home with gentian violet (1%), a dye that kills bacteria and fungi. It is available without a prescription. If there is no known cause for the infection or if gentian violet does not cure the thrush, you need to see your caregiver. Comfort measures Measures can be taken to reduce the discomfort of thrush:  Drink cold liquids such as water or iced tea. Eat flavored ice treats or frozen juices.  Eat foods that are easy to swallow such as gelatin, ice cream, or custard.  If the patches are painful, try drinking from a straw.  Rinse your mouth several times a day with a warm saltwater rinse. You can make the saltwater mixture with 1 tsp (5 g) of salt in 8 fl oz (0.2 L) of warm water. PROGNOSIS   Most cases of thrush are mild and clear up with the use of an antifungal mouth rinse or lozenges. Very mild cases of thrush may clear up without medical treatment. It usually takes about 14 days of treatment with an oral antifungal medication to cure more severe thrush infections. In some cases, thrush may last several weeks even with treatment.  If thrush goes untreated and does not go away by itself, it can spread to other parts of the body.  Thrush can spread to the throat, the vagina, or the skin. It rarely spreads to other organs of the body. Ginette Pitman is more likely to recur (come back) in:  People who use inhaled corticosteroids to treat asthma.  People who take antibiotic medications for a long time.  People who have false teeth.  People who have a weakened immune system. RISKS AND COMPLICATIONS Complications related to thrush are rare in healthy people. There are several factors that can increase your risk of developing thrush. Age Older adults, especially those who have serious health problems, are more likely to develop thrush because their immune systems  are likely to be weaker. Behavior  The yeast that causes thrush can be spread by oral sex.  Heavy smoking can lower the body's ability to fight off infections. This makes thrush more likely to develop. Other conditions  False teeth (dentures), braces, or a retainer that irritates the mouth make it hard to keep the mouth clean. An unclean mouth is more likely to develop thrush than a clean mouth.  People with a weakened immune system, such as those who have diabetes or human immunodeficiency virus (HIV) or who are undergoing chemotherapy, have an increased risk for developing thrush. Medications Some medications can allow the fungus that causes thrush to grow uncontrolled. Common ones are:  Antibiotics, especially those that kill a wide range of organisms (broad-spectrum antibiotics), such as tetracycline commonly can cause thrush.  Birth control pills (oral contraceptives).  Medications that weaken the body's immune system, such as corticosteroids. Environment Exposure over time to certain environmental chemicals, such as benzene and pesticides, can weaken the body's immune system. This increases your risk for developing infections, including thrush. SEEK IMMEDIATE MEDICAL CARE IF:  Your symptoms are getting worse or are not improving within 7 days of starting treatment.  You have symptoms of spreading infection, such as white patches on the skin outside of the mouth.  You are nursing and you have redness and pain in the nipples in spite of home treatment or if you have burning pain in the nipple area when you nurse. Your baby's mouth should also be examined to determine whether thrush is causing your symptoms. Document Released: 04/03/2004 Document Revised: 10/01/2011 Document Reviewed: 07/14/2008 [facility] Patient Information 2013 [place], [place].

## 2012-12-06 NOTE — Progress Notes (Signed)
[facility] [address], [place] [address] [phone]  Date:  12/06/2012   Name:  Carrie Reynolds   DOB:  1956-03-27   MRN:  [id number]  PCP:  Tonye Pearson, MD    Chief Complaint: Sore Throat and Thrush   History of Present Illness:  Carrie Reynolds is a 57 y.o. very pleasant female patient who presents with the following:  Ill this week with nasal congestion, drainage and post nasal mucoid drip.  Has a cough productive of mucoid sputum.  No fever or chills.  No wheezing or shortness of breath.  No nausea or vomiting.  No stool change.  Thinks she has thrush on her tongue.  No improvement with over the counter medications or other home remedies. Denies other complaint or health concern today.   Patient Active Problem List   Diagnosis Date Noted  . Sleep apnea 04/25/2012  . Obesity 04/25/2012  . Depression 04/25/2012  . Environmental allergies 04/25/2012  . Hypertension 04/25/2012  . Hypercholesteremia 04/25/2012  . Postmenopausal 09/18/2011  . LEG EDEMA 03/16/2010    Past Medical History  Diagnosis Date  . Hypertension   . Arnold-Chiari malformation 2006    Past Surgical History  Procedure Laterality Date  . Abdominal hysterectomy  2003    partial- L ovary remains  . Hand surgery    . Bunionectomy      right great toe    History  Substance Use Topics  . Smoking status: Former Smoker -- 0.10 packs/day for 30 years    Types: Cigarettes    Quit date: 04/26/2012  . Smokeless tobacco: Never Used  . Alcohol Use: Yes     Comment: occasional    Family History  Problem Relation Age of Onset  . Cancer Father     lung  . Alzheimer's disease Father   . Alzheimer's disease Paternal Grandmother   . Cancer Paternal Grandfather     lung    Allergies  Allergen Reactions  . Lipitor (Atorvastatin Calcium) Cough  . Lisinopril Cough  . Penicillins Swelling  . Simvastatin Cough  . Tetracycline     REACTION: rash     Medication list has been reviewed and updated.  Current Outpatient Prescriptions on File Prior to Visit  Medication Sig Dispense Refill  . albuterol (PROVENTIL HFA;VENTOLIN HFA) 108 (90 BASE) MCG/ACT inhaler Inhale 2 puffs into the lungs every 4 (four) hours as needed for wheezing (cough, shortness of breath or wheezing.).  1 Inhaler  1  . amLODipine (NORVASC) 10 MG tablet TAKE 1 TABLET BY MOUTH DAILY  30 tablet  3  . CRESTOR 10 MG tablet TAKE 1 TABLET EVERY DAY  30 tablet  2  . losartan-hydrochlorothiazide (HYZAAR) 100-12.5 MG per tablet TAKE 1 TABLET BY MOUTH DAILY.  90 tablet  0  . mometasone (NASONEX) 50 MCG/ACT nasal spray Place 2 sprays into the nose daily.  51 g  3  . montelukast (SINGULAIR) 10 MG tablet Take 1 tablet (10 mg total) by mouth at bedtime.  90 tablet  3  . venlafaxine XR (EFFEXOR-XR) 150 MG 24 hr capsule Take one tablet daily  90 capsule  1  . beclomethasone (QVAR) 80 MCG/ACT inhaler Inhale 2 puffs into the lungs 2 (two) times daily.  1 Inhaler  12  . ipratropium (ATROVENT) 0.03 % nasal spray Place 2 sprays into the nose 2 (two) times daily.  30 mL  0  . [DISCONTINUED] ARIPiprazole (ABILIFY) 10 MG tablet  Take 10 mg by mouth daily.        . [DISCONTINUED] cloNIDine (CATAPRES) 0.1 MG tablet Take 1 tablet (0.1 mg total) by mouth 3 (three) times daily.  90 tablet  0   No current facility-administered medications on file prior to visit.    Review of Systems:  As per HPI, otherwise negative.    Physical Examination: Filed Vitals:   12/06/12 1035  BP: 152/100  Pulse: 93  Temp: 98.8 F (37.1 C)  Resp: 16   Filed Vitals:   12/06/12 1035  Height: 5' 7.5" (1.715 m)  Weight: 233 lb 3.2 oz (105.779 kg)   Body mass index is 35.96 kg/(m^2). Ideal Body Weight: Weight in (lb) to have BMI = 25: 161.7  GEN: WDWN, NAD, Non-toxic, A & O x 3 HEENT: Atraumatic, Normocephalic. Neck supple. No masses, No LAD. Ears and Nose: No external deformity. CV: RRR, No M/G/R. No  JVD. No thrill. No extra heart sounds. PULM: CTA B, no wheezes, crackles, rhonchi. No retractions. No resp. distress. No accessory muscle use. ABD: S, NT, ND, +BS. No rebound. No HSM. EXTR: No c/c/e NEURO Normal gait.  PSYCH: Normally interactive. Conversant. Not depressed or anxious appearing.  Calm demeanor.    Assessment and Plan: Seasonal allergic rhinitis mucinex d Allegra flonase   Signed,  Phillips Odor, MD

## 2012-12-07 ENCOUNTER — Other Ambulatory Visit: Payer: Self-pay | Admitting: Physician Assistant

## 2012-12-08 ENCOUNTER — Ambulatory Visit (INDEPENDENT_AMBULATORY_CARE_PROVIDER_SITE_OTHER): Payer: BC Managed Care – PPO | Admitting: Family Medicine

## 2012-12-08 VITALS — BP 174/104 | HR 74 | Temp 99.3°F | Resp 18 | Ht 67.0 in | Wt 231.0 lb

## 2012-12-08 DIAGNOSIS — J029 Acute pharyngitis, unspecified: Secondary | ICD-10-CM

## 2012-12-08 MED ORDER — CHLORHEXIDINE GLUCONATE 0.12 % MT SOLN: 15.0000 mL | Freq: Two times a day (BID) | OROMUCOSAL | Status: DC

## 2012-12-08 MED ORDER — CEFTRIAXONE SODIUM 1 G IJ SOLR
250.0000 mg | Freq: Once | INTRAMUSCULAR | Status: AC
  Administered 2012-12-08: 250 mg via INTRAMUSCULAR

## 2012-12-08 NOTE — Progress Notes (Signed)
Patient ID: Carrie Reynolds MRN: [id number], DOB: 09/24/1955, 57 y.o. Date of Encounter: 12/08/2012, 8:15 PM  Primary Physician: Tonye Pearson, MD  Chief Complaint:  Chief Complaint  Patient presents with  . Follow-up    HPI: 57 y.o. year old female presents with 3 day history of sore throat. Subjective fever and chills. No cough, congestion, rhinorrhea, sinus pressure, otalgia, or headache. Normal hearing. No GI complaints. Able to swallow saliva, but hurts to do so. Decreased appetite secondary to sore throat.   Since her visit 48 hours ago, the throat has gotten much worse.  Past Medical History  Diagnosis Date  . Hypertension   . Arnold-Chiari malformation 2006     Home Meds: Prior to Admission medications   Medication Sig Start Date End Date Taking? Authorizing Provider  albuterol (PROVENTIL HFA;VENTOLIN HFA) 108 (90 BASE) MCG/ACT inhaler Inhale 2 puffs into the lungs every 4 (four) hours as needed for wheezing (cough, shortness of breath or wheezing.). 04/01/12 04/01/13 Yes Phillips Odor, MD  amLODipine (NORVASC) 10 MG tablet TAKE 1 TABLET BY MOUTH DAILY 05/03/12  Yes Eleanore E Egan, PA-C  amLODipine (NORVASC) 10 MG tablet TAKE 1 TABLET (10 MG TOTAL) BY MOUTH DAILY. 12/07/12  Yes Ryan M Dunn, PA-C  beclomethasone (QVAR) 80 MCG/ACT inhaler Inhale 2 puffs into the lungs 2 (two) times daily. 05/29/12  Yes Chelle S Jeffery, PA-C  chlorpheniramine-HYDROcodone (TUSSIONEX PENNKINETIC ER) 10-8 MG/5ML LQCR Take 5 mLs by mouth every 12 (twelve) hours as needed. 12/06/12  Yes Phillips Odor, MD  CRESTOR 10 MG tablet TAKE 1 TABLET EVERY DAY 09/06/12  Yes Eleanore Delia Chimes, PA-C  fluconazole (DIFLUCAN) 150 MG tablet Take 1 tablet (150 mg total) by mouth once. Repeat if needed 12/06/12  Yes Phillips Odor, MD  ipratropium (ATROVENT) 0.03 % nasal spray Place 2 sprays into the nose 2 (two) times daily. 01/18/12 01/17/13 Yes Chelle S Jeffery, PA-C  losartan-hydrochlorothiazide  (HYZAAR) 100-12.5 MG per tablet TAKE 1 TABLET BY MOUTH DAILY. 10/22/12  Yes Ryan M Dunn, PA-C  mometasone (NASONEX) 50 MCG/ACT nasal spray Place 2 sprays into the nose daily. 04/25/12 04/25/13 Yes Sarah Harvie Bridge, PA-C  montelukast (SINGULAIR) 10 MG tablet Take 1 tablet (10 mg total) by mouth at bedtime. 04/25/12 04/25/13 Yes Sarah Harvie Bridge, PA-C  pseudoephedrine-guaifenesin (MUCINEX D) 60-600 MG per tablet Take 1 tablet by mouth every 12 (twelve) hours. 12/06/12 12/06/13 Yes Phillips Odor, MD  venlafaxine XR (EFFEXOR-XR) 150 MG 24 hr capsule Take one tablet daily 04/25/12  Yes Morrell Riddle, PA-C    Allergies:  Allergies  Allergen Reactions  . Lipitor (Atorvastatin Calcium) Cough  . Lisinopril Cough  . Penicillins Swelling  . Simvastatin Cough  . Tetracycline     REACTION: rash    History   Social History  . Marital Status: Married    Spouse Name: Quenesha Douglass    Number of Children: 2  . Years of Education: 12   Occupational History  . 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 Smoking status: Former Smoker -- 0.10 packs/day for 30 years    Types: Cigarettes    Quit date: 04/26/2012  . Smokeless tobacco: Never Used  . Alcohol Use: Yes     Comment: occasional  . Drug Use: No  . Sexually Active: Yes -- Female partner(s)    Birth Control/ Protection: Surgical   Other Topics Concern  . Not on file  Social History Narrative   Lives with husband.     Review of Systems: Constitutional: negative for chills, fever, night sweats or weight changes HEENT: see above Cardiovascular: negative for chest pain or palpitations Respiratory: negative for hemoptysis, wheezing, or shortness of breath Abdominal: negative for abdominal pain, nausea, vomiting or diarrhea Dermatological: negative for rash Neurologic: negative for headache   Physical Exam: Blood pressure 174/104, pulse 74, temperature 99.3 F (37.4 C), temperature source Oral, resp. rate 18, height 5\' 7"   (1.702 m), weight 231 lb (104.781 kg), SpO2 98.00%., Body mass index is 36.17 kg/(m^2). General: Well developed, well nourished, in no acute distress. Head: Normocephalic, atraumatic, eyes without discharge, sclera non-icteric, nares are patent. Bilateral auditory canals clear, TM's are without perforation, pearly grey with reflective cone of light bilaterally. No sinus TTP. Oral cavity moist, dentition normal. Posterior pharynx with post nasal drip and mild erythema. Large ulcer on uvula. Neck: Supple. No thyromegaly. Full ROM. No lymphadenopathy. Lungs: Clear bilaterally to auscultation without wheezes, rales, or rhonchi. Breathing is unlabored. Heart: RRR with S1 S2. No murmurs, rubs, or gallops appreciated. Abdomen: Soft, non-tender, non-distended with normoactive bowel sounds. No hepatomegaly. No rebound/guarding. No obvious abdominal masses. Msk:  Strength and tone normal for age. Extremities: No clubbing or cyanosis. No edema. Neuro: Alert and oriented X 3. Moves all extremities spontaneously. CNII-XII grossly in tact. Psych:  Responds to questions appropriately with a normal affect.    ASSESSMENT AND PLAN:  57 y.o. year old female with acute pharyngitis - -Tylenol/Motrin prn -Rest/fluids -RTC precautions -RTC 3-5 days if no improvement  Signed, Elvina Sidle, MD 12/08/2012 8:15 PM

## 2012-12-08 NOTE — Patient Instructions (Addendum)
Place pharyngitis patient instructions here.

## 2012-12-10 ENCOUNTER — Ambulatory Visit (INDEPENDENT_AMBULATORY_CARE_PROVIDER_SITE_OTHER): Payer: BC Managed Care – PPO | Admitting: Family Medicine

## 2012-12-10 VITALS — BP 164/94 | HR 87 | Temp 98.8°F | Resp 18 | Ht 68.0 in | Wt 233.0 lb

## 2012-12-10 DIAGNOSIS — R5383 Other fatigue: Secondary | ICD-10-CM

## 2012-12-10 DIAGNOSIS — R220 Localized swelling, mass and lump, head: Secondary | ICD-10-CM

## 2012-12-10 DIAGNOSIS — J029 Acute pharyngitis, unspecified: Secondary | ICD-10-CM

## 2012-12-10 DIAGNOSIS — R221 Localized swelling, mass and lump, neck: Secondary | ICD-10-CM

## 2012-12-10 DIAGNOSIS — H9201 Otalgia, right ear: Secondary | ICD-10-CM

## 2012-12-10 DIAGNOSIS — R5381 Other malaise: Secondary | ICD-10-CM

## 2012-12-10 LAB — POCT CBC
Granulocyte percent: 63.9 % (ref 37–80)
HCT, POC: 43.4 % (ref 37.7–47.9)
Hemoglobin: 14.3 g/dL (ref 12.2–16.2)
Lymph, poc: 1.9 (ref 0.6–3.4)
MCH, POC: 31.3 pg — AB (ref 27–31.2)
MCHC: 32.9 g/dL (ref 31.8–35.4)
MCV: 95 fL (ref 80–97)
MID (cbc): 0.4 (ref 0–0.9)
MPV: 10 fL (ref 0–99.8)
POC Granulocyte: 4.1 (ref 2–6.9)
POC LYMPH PERCENT: 29.5 % (ref 10–50)
POC MID %: 6.6 %M (ref 0–12)
Platelet Count, POC: 203 10*3/uL (ref 142–424)
RBC: 4.57 M/uL (ref 4.04–5.48)
RDW, POC: 13.3 %
WBC: 6.4 10*3/uL (ref 4.6–10.2)

## 2012-12-10 LAB — POCT RAPID STREP A (OFFICE): Rapid Strep A Screen: NEGATIVE

## 2012-12-10 MED ORDER — AZITHROMYCIN 500 MG PO TABS: 500.0000 mg | ORAL_TABLET | Freq: Every day | ORAL | Status: DC

## 2012-12-10 NOTE — Progress Notes (Addendum)
[facility]:  Office Visit  Chief Complaint:  Chief Complaint  Patient presents with  . Facial Swelling    right side of face; causing ear pain     HPI: Carrie Reynolds is a 57 y.o. female who complains of: Here for f/u with right sided facial pain and also right  Sided ear pain. Coughing up yellow sputum. Denies fevers. Has flonase and has been taking it but not getting relief. She was here on 5/17 and also 5/19 and was dx with seasonal allergies and acute pharyngitis due to allergies vs viral infection. She now has swelling along right jaw and ear pain. She denies fevers, has had chills. Denies hearing loss/ tininitis. Has HA and facial pain. Denies tooth ache or dental caries. She is making saliva. She feels run down and tired.  Denies SOB/Wheezing/CP   Past Medical History  Diagnosis Date  . Hypertension   . Arnold-Chiari malformation 2006   Past Surgical History  Procedure Laterality Date  . Abdominal hysterectomy  2003    partial- L ovary remains  . Hand surgery    . Bunionectomy      right great toe   History   Social History  . Marital Status: Married    Spouse Name: Vania Rosero    Number of Children: 2  . Years of Education: 12   Occupational History  . 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 Smoking status: Former Smoker -- 0.10 packs/day for 30 years    Types: Cigarettes    Quit date: 04/26/2012  . Smokeless tobacco: Never Used  . Alcohol Use: Yes     Comment: occasional  . Drug Use: No  . Sexually Active: Yes -- Female partner(s)    Birth Control/ Protection: Surgical   Other Topics Concern  . None   Social History Narrative   Lives with husband.   Family History  Problem Relation Age of Onset  . Cancer Father     lung  . Alzheimer's disease Father   . Alzheimer's disease Paternal Grandmother   . Cancer Paternal Grandfather     lung   Allergies  Allergen Reactions  . Lipitor  (Atorvastatin Calcium) Cough  . Lisinopril Cough  . Penicillins Swelling  . Simvastatin Cough  . Tetracycline     REACTION: rash   Prior to Admission medications   Medication Sig Start Date End Date Taking? Authorizing Provider  amLODipine (NORVASC) 10 MG tablet TAKE 1 TABLET (10 MG TOTAL) BY MOUTH DAILY. 12/07/12  Yes Ryan M Dunn, PA-C  beclomethasone (QVAR) 80 MCG/ACT inhaler Inhale 2 puffs into the lungs 2 (two) times daily. 05/29/12  Yes Chelle S Jeffery, PA-C  chlorhexidine (PERIDEX) 0.12 % solution Use as directed 15 mLs in the mouth or throat 2 (two) times daily. 12/08/12  Yes Elvina Sidle, MD  chlorpheniramine-HYDROcodone [facility]) 10-8 MG/5ML LQCR Take 5 mLs by mouth every 12 (twelve) hours as needed. 12/06/12  Yes Phillips Odor, MD  CRESTOR 10 MG tablet TAKE 1 TABLET EVERY DAY 09/06/12  Yes Eleanore Delia Chimes, PA-C  fluconazole (DIFLUCAN) 150 MG tablet Take 1 tablet (150 mg total) by mouth once. Repeat if needed 12/06/12  Yes Phillips Odor, MD  losartan-hydrochlorothiazide [facility]) 100-12.5 MG per tablet TAKE 1 TABLET BY MOUTH DAILY. 10/22/12  Yes Ryan M Dunn, PA-C  mometasone (NASONEX) 50 MCG/ACT nasal spray Place 2 sprays into the nose daily. 04/25/12 04/25/13 Yes Sarah  L Weber, PA-C  montelukast (SINGULAIR) 10 MG tablet Take 1 tablet (10 mg total) by mouth at bedtime. 04/25/12 04/25/13 Yes Sarah Harvie Bridge, PA-C  pseudoephedrine-guaifenesin (MUCINEX D) 60-600 MG per tablet Take 1 tablet by mouth every 12 (twelve) hours. 12/06/12 12/06/13 Yes Phillips Odor, MD  venlafaxine XR (EFFEXOR-XR) 150 MG 24 hr capsule Take one tablet daily 04/25/12  Yes Morrell Riddle, PA-C  albuterol (PROVENTIL HFA;VENTOLIN HFA) 108 (90 BASE) MCG/ACT inhaler Inhale 2 puffs into the lungs every 4 (four) hours as needed for wheezing (cough, shortness of breath or wheezing.). 04/01/12 04/01/13  Phillips Odor, MD  amLODipine (NORVASC) 10 MG tablet TAKE 1 TABLET BY MOUTH DAILY 05/03/12   Eleanore Delia Chimes,  PA-C  ipratropium (ATROVENT) 0.03 % nasal spray Place 2 sprays into the nose 2 (two) times daily. 01/18/12 01/17/13  Chelle S Jeffery, PA-C     ROS: The patient denies fevers,night sweats, unintentional weight loss, chest pain, palpitations, wheezing, dyspnea on exertion, nausea, vomiting, abdominal pain, dysuria, hematuria, melena, numbness, weakness, or tingling.   All other systems have been reviewed and were otherwise negative with the exception of those mentioned in the HPI and as above.    PHYSICAL EXAM: Filed Vitals:   12/10/12 0846  BP: 164/94  Pulse: 87  Temp: 98.8 F (37.1 C)  Resp: 18   Filed Vitals:   12/10/12 0846  Height: 5\' 8"  (1.727 m)  Weight: 233 lb (105.688 kg)   Body mass index is 35.44 kg/(m^2).  General: Alert, no acute distress HEENT:  Normocephalic, atraumatic, oropharynx patent. EOMI, PERRLA. Right TM slighty erythematous. No exudates. + swelling of right parotid. No dental caries. Slightly erythematous throat Cardiovascular:  Regular rate and rhythm, no rubs murmurs or gallops.  No Carotid bruits, radial pulse intact. No pedal edema.  Respiratory: Clear to auscultation bilaterally.  No wheezes, rales, or rhonchi.  No cyanosis, no use of accessory musculature GI: No organomegaly, abdomen is soft and non-tender, positive bowel sounds.  No masses. Skin: No rashes. Neurologic: Facial musculature symmetric. Psychiatric: Patient is appropriate throughout our interaction. Lymphatic: No cervical lymphadenopathy Musculoskeletal: Gait intact.   LABS: Results for orders placed in visit on 12/10/12  POCT CBC      Result Value Range   WBC 6.4  4.6 - 10.2 K/uL   Lymph, poc 1.9  0.6 - 3.4   POC LYMPH PERCENT 29.5  10 - 50 %L   MID (cbc) 0.4  0 - 0.9   POC MID % 6.6  0 - 12 %M   POC Granulocyte 4.1  2 - 6.9   Granulocyte percent 63.9  37 - 80 %G   RBC 4.57  4.04 - 5.48 M/uL   Hemoglobin 14.3  12.2 - 16.2 g/dL   HCT, POC 81.1  91.4 - 47.9 %   MCV 95.0  80 -  97 fL   MCH, POC 31.3 (*) 27 - 31.2 pg   MCHC 32.9  31.8 - 35.4 g/dL   RDW, POC 78.2     Platelet Count, POC 203  142 - 424 K/uL   MPV 10.0  0 - 99.8 fL  POCT RAPID STREP A (OFFICE)      Result Value Range   Rapid Strep A Screen Negative  Negative     EKG/XRAY:   Primary read interpreted by Dr. Conley Rolls at [facility].   ASSESSMENT/PLAN: Encounter Diagnoses  Name Primary?  . Acute pharyngitis Yes  . Right facial swelling   . Other malaise  and fatigue   . Otalgia of right ear    Will presumptively treat for OM and facial pain/parotid swelling She is UTD on all her vaccines Will give Azithromycin 500 mg daily x  5 days C/w sxs treatment with Tussionex and Flonase and antihistamine without D Would like referral for Arnold Chiari Malformation  To [facility] or [facility] Gross sideeffects, risk and benefits, and alternatives of medications d/w patient. Patient is aware that all medications have potential sideeffects and we are unable to predict every sideeffect or drug-drug interaction that may occur. F/u prn or in 48 hrs      Janeice Stegall PHUONG, DO 12/10/2012 9:44 AM

## 2012-12-11 ENCOUNTER — Encounter: Payer: Self-pay | Admitting: Neurology

## 2012-12-11 ENCOUNTER — Ambulatory Visit: Payer: BC Managed Care – PPO | Admitting: Neurology

## 2012-12-11 LAB — EPSTEIN-BARR VIRUS VCA, IGM: EBV VCA IgM: 23.7 U/mL (ref <36.0)

## 2012-12-24 ENCOUNTER — Encounter: Payer: Self-pay | Admitting: Family Medicine

## 2013-02-24 ENCOUNTER — Other Ambulatory Visit: Payer: Self-pay | Admitting: Physician Assistant

## 2013-03-18 ENCOUNTER — Other Ambulatory Visit: Payer: Self-pay | Admitting: Physician Assistant

## 2013-03-18 DIAGNOSIS — N6320 Unspecified lump in the left breast, unspecified quadrant: Secondary | ICD-10-CM

## 2013-03-30 ENCOUNTER — Other Ambulatory Visit: Payer: Self-pay | Admitting: Physician Assistant

## 2013-04-06 DIAGNOSIS — Z0289 Encounter for other administrative examinations: Secondary | ICD-10-CM

## 2013-04-09 ENCOUNTER — Ambulatory Visit
Admission: RE | Admit: 2013-04-09 | Discharge: 2013-04-09 | Disposition: A | Discharge status: Home / Self Care | Payer: BC Managed Care – PPO | Source: Ambulatory Visit | Attending: Physician Assistant | Admitting: Physician Assistant

## 2013-04-09 DIAGNOSIS — N6320 Unspecified lump in the left breast, unspecified quadrant: Secondary | ICD-10-CM

## 2013-05-22 ENCOUNTER — Other Ambulatory Visit: Payer: Self-pay | Admitting: Physician Assistant

## 2013-05-28 ENCOUNTER — Other Ambulatory Visit: Payer: Self-pay

## 2013-07-12 ENCOUNTER — Other Ambulatory Visit: Payer: Self-pay | Admitting: Physician Assistant

## 2013-07-29 DIAGNOSIS — E785 Hyperlipidemia, unspecified: Secondary | ICD-10-CM | POA: Insufficient documentation

## 2013-11-16 ENCOUNTER — Encounter: Payer: Self-pay | Admitting: *Deleted

## 2013-11-19 ENCOUNTER — Encounter: Payer: Self-pay | Admitting: Neurology

## 2013-11-19 ENCOUNTER — Ambulatory Visit (INDEPENDENT_AMBULATORY_CARE_PROVIDER_SITE_OTHER): Payer: BC Managed Care – PPO | Admitting: Neurology

## 2013-11-19 VITALS — BP 154/88 | HR 78 | Resp 16 | Ht 67.5 in | Wt 240.0 lb

## 2013-11-19 DIAGNOSIS — E662 Morbid (severe) obesity with alveolar hypoventilation: Secondary | ICD-10-CM | POA: Insufficient documentation

## 2013-11-19 DIAGNOSIS — G4733 Obstructive sleep apnea (adult) (pediatric): Secondary | ICD-10-CM

## 2013-11-19 DIAGNOSIS — Z9989 Dependence on other enabling machines and devices: Principal | ICD-10-CM

## 2013-11-19 NOTE — Patient Instructions (Signed)
Hypersomnia Hypersomnia usually brings recurrent episodes of excessive daytime sleepiness or prolonged nighttime sleep. It is different than feeling tired due to lack of or interrupted sleep at night. People with hypersomnia are compelled to nap repeatedly during the day. This is often at inappropriate times such as:  At work.  During a meal.  In conversation. These daytime naps usually provide no relief. This disorder typically affects adolescents and young adults. CAUSES  This condition may be caused by:  Another sleep disorder (such as narcolepsy or sleep apnea).  Dysfunction of the autonomic nervous system.  Drug or alcohol abuse.  A physical problem, such as:  A tumor.  Head trauma. This is damage caused by an accident.  Injury to the central nervous system.  Certain medications, or medicine withdrawal.  Medical conditions may contribute to the disorder, including:  Multiple sclerosis.  Depression.  Encephalitis.  Epilepsy.  Obesity.  Some people appear to have a genetic predisposition to this disorder. In others, there is no known cause. SYMPTOMS   Patients often have difficulty waking from a long sleep. They may feel dazed or confused.  Other symptoms may include:  Anxiety.  Increased irritation (inflammation).  Decreased energy.  Restlessness.  Slow thinking.  Slow speech.  Loss of appetite.  Hallucinations.  Memory difficulty.  Tremors, Tics.  Some patients lose the ability to function in family, social, occupational, or other settings. TREATMENT  Treatment is symptomatic in nature. Stimulants and other drugs may be used to treat this disorder. Changes in behavior may help. For example, avoid night work and social activities that delay bed time. Changes in diet may offer some relief. Patients should avoid alcohol and caffeine. PROGNOSIS  The likely outcome (prognosis) for persons with hypersomnia depends on the cause of the disorder.  The disorder itself is not life threatening. But it can have serious consequences. For example, automobile accidents can be caused by falling asleep while driving. The attacks usually continue indefinitely. Document Released: 06/29/2002 Document Revised: 10/01/2011 Document Reviewed: 06/02/2008 [employer] Patient Information 2014 [employer].  
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
## 2013-12-01 ENCOUNTER — Encounter: Payer: Self-pay | Admitting: Neurology

## 2013-12-04 ENCOUNTER — Telehealth: Payer: Self-pay | Admitting: Neurology

## 2013-12-04 NOTE — Telephone Encounter (Signed)
I called the patient and gave her the results from her recent ONO which shows that she does not required O2.  However, a copy of the patient's report was sent to Dr. Darron Doom with Dr. Edwena Felty recommendation of a cardiology workup due to noticeable heart rate variation.

## 2014-01-21 ENCOUNTER — Ambulatory Visit: Payer: Self-pay | Admitting: Adult Health

## 2014-02-04 ENCOUNTER — Encounter: Payer: Self-pay | Admitting: Adult Health

## 2014-02-04 ENCOUNTER — Ambulatory Visit (INDEPENDENT_AMBULATORY_CARE_PROVIDER_SITE_OTHER): Payer: BC Managed Care – PPO | Admitting: Adult Health

## 2014-02-04 VITALS — Ht 67.0 in | Wt 232.0 lb

## 2014-02-04 DIAGNOSIS — R531 Weakness: Secondary | ICD-10-CM

## 2014-02-04 DIAGNOSIS — R51 Headache: Secondary | ICD-10-CM

## 2014-02-04 DIAGNOSIS — G4733 Obstructive sleep apnea (adult) (pediatric): Secondary | ICD-10-CM

## 2014-02-04 DIAGNOSIS — M6281 Muscle weakness (generalized): Secondary | ICD-10-CM

## 2014-02-04 DIAGNOSIS — Z9989 Dependence on other enabling machines and devices: Principal | ICD-10-CM

## 2014-02-04 DIAGNOSIS — E669 Obesity, unspecified: Secondary | ICD-10-CM

## 2014-02-04 NOTE — Patient Instructions (Addendum)

## 2014-02-04 NOTE — Progress Notes (Signed)
PATIENT: Carrie Reynolds DOB: 10/16/1955  REASON FOR VISIT: follow up HISTORY FROM: patient  HISTORY OF PRESENT ILLNESS: Carrie Reynolds is a 58 year old female with history of obstructive sleep Apnea for greater than 10 years. She returns today for a 30 day compliance download. She brought her machine with her today and the reports shows an AHI of 3.2  at 5-14cm of water on auto-titration with EPR 3, uses her machine for 5 hours and 46 minutes a night, with 92% compliance. Her Epworth score is 11 points was previously 12 points. Her fatigue severity score is 48  was previously 44.  Patient reports that she gets about 6 hours of sleep a night. She goes to bed around 9:30pm and is usually woken up from pain in the back of her head around 3:30am. She denies having trouble falling asleep. Overall the patient does not feel that her sleepiness has improved with the CPAP. She continues to have pain the back of her head. It only occurs in the morning and will resolve by 10 am. She states that she is having word finding issues and it has progressive gotten worse. Patient states that her right eyebrow is drooping but that has been there for several years. She also feels that the right arm is weaker, when lifting objects. She states that the pain in the back of her head is usually what wakes her up which effects her sleep. Word finding has become such an issue that it is affecting her performance at work. 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 Patient has lost 13 lbs since the last visit.     REVIEW OF SYSTEMS: Full 14 system review of systems performed and notable only for:  Constitutional: N/A  Eyes: eye redness Ear/Nose/Throat: ringing in the ears Skin: N/A  Cardiovascular: leg swelling, palpitations  Respiratory: N/A  Gastrointestinal: N/A  Genitourinary: N/A Hematology/Lymphatic: N/A  Endocrine: excessive thirst, excessive eating    Musculoskeletal:neck pain, neck stiffness  Allergy/Immunology: N/A  Neurological: memory loss, headache, facial drooping Psychiatric: N/A Sleep: restless leg, apnea, daytime sleepiness, snoring   ALLERGIES: Allergies  Allergen Reactions  . Ceclor [Cefaclor] Hives    REACTION: rash  . Lipitor [Atorvastatin Calcium] Cough  . Lisinopril Cough  . Penicillins Swelling  . Simvastatin Cough  . Tetracycline     REACTION: rash  . Amoxicillin Rash    REACTION: rash  . Ampicillin Rash    REACTION: rash    HOME MEDICATIONS: Outpatient Prescriptions Prior to Visit  Medication Sig Dispense Refill  . ALPRAZolam (XANAX) 0.25 MG tablet Take 0.25 mg by mouth daily.      [name] atorvastatin (LIPITOR) 40 MG tablet Take 40 mg by mouth daily.      [name] buPROPion (WELLBUTRIN XL) 150 MG 24 hr tablet Take 150 mg by mouth daily.      . cloNIDine (CATAPRES) 0.1 MG tablet Take 0.1 mg by mouth daily as needed.       . etodolac (LODINE) 400 MG tablet Take 400 mg by mouth daily.      [name] losartan-hydrochlorothiazide (HYZAAR) 100-12.5 MG per tablet TAKE 1 TABLET BY MOUTH DAILY.  90 tablet  1  . metFORMIN (GLUCOPHAGE) 500 MG tablet Take 500 mg by mouth daily.      . montelukast (SINGULAIR) 10 MG tablet Take 1 tablet (10 mg total) by mouth at bedtime. PATIENT NEEDS OFFICE VISIT FOR ADDITIONAL  REFILLS  30 tablet  0  . venlafaxine XR (EFFEXOR-XR) 37.5 MG 24 hr capsule Take 37.5 mg by mouth daily with breakfast.      . HYDROcodone-acetaminophen (NORCO/VICODIN) 5-325 MG per tablet Take 1 tablet by mouth daily.       No facility-administered medications prior to visit.    PAST MEDICAL HISTORY: Past Medical History  Diagnosis Date  . Hypertension   . Arnold-Chiari malformation 2006  . Depression   . Obesity   . Vertigo     post concussive  . OSA (obstructive sleep apnea)     not treated  . Cervical strain     syndrome  . Migraine headache     with visual aura  . Cervicogenic headache   . CTS (carpal tunnel  syndrome)   . Glucose intolerance (impaired glucose tolerance)   . Postmenopausal     PAST SURGICAL HISTORY: Past Surgical History  Procedure Laterality Date  . Abdominal hysterectomy  2003    partial- L ovary remains  . Hand surgery    . Bunionectomy      right great toe  . Tubal ligation Bilateral     FAMILY HISTORY: Family History  Problem Relation Age of Onset  . Cancer Father     lung  . Alzheimer's disease Father   . Alzheimer's disease Paternal Grandmother   . Cancer Paternal Grandfather     lung    SOCIAL HISTORY: History   Social History  . Marital Status: Married    Spouse Name: Carrie Reynolds    Number of Children: 2  . Years of Education: 12   Occupational History  . 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 Smoking status: Former Smoker -- 0.10 packs/day for 30 years    Types: Cigarettes    Quit date: 04/26/2012  . Smokeless tobacco: Never Used  . Alcohol Use: Yes     Comment: occasional  . Drug Use: No  . Sexual Activity: Yes    Partners: Male    Birth Control/ Protection: Surgical   Other Topics Concern  . Not on file   Social History Narrative   Lives with husband Carrie Reynolds).   For her sleep apnea evaluation,  severe hypopneas.  just stopped smoking oct 3 rd. 2013 .    AHI of 68  was SPLIT but could not find a pressure that served her well in supine,  autotitrated CPAP between 8 and 12 cm water with nasal pillows or whisp mask (claustrophobia) for 30 days ordered, based on  insurance, but she missed her appointmetn with DME- . Then will set to 95%. She had a 15 minute  visit with sleep clinic but did not bring the machine.   patient had 2 ATB treatments for coughing without success, viral or inflammatory? trial of prednisone.                PHYSICAL EXAM  Filed Vitals:   02/04/14 1328  Height: 5\' 7"  (1.702 m)  Weight: 232 lb (105.235 kg)   Body mass index is 36.33 kg/(m^2).  Generalized: Well developed, in  no acute distress  Neck: Circumference 16 inches, Mallampati 4+  Neurological examination  Mentation: Alert oriented to time, place, history taking. Follows all commands speech and language fluent. Word finding problems noted.  Cranial nerve II-XII:  Extraocular movements were full, visual field were full on confrontational test. Facial sensation and strength were normal. Uvula tongue midline. Head turning  and shoulder shrug  were normal and symmetric. Motor: The motor testing reveals 5 over 5 strength in the left upper and lower extremities. 4 over 5 strength in the right upper and lower extremities.  Good symmetric motor tone is noted throughout. Pain that radiates to shoulders with flexion of the neck. No pain with extension.  Sensory: Sensory testing is intact to soft touch on all 4 extremities. No evidence of extinction is noted.  Coordination: Cerebellar testing reveals good finger-nose-finger and heel-to-shin bilaterally.  Gait and station: Gait is normal. Tandem gait is normal. Romberg is negative. No drift is seen.  Reflexes: Deep tendon reflexes are symmetric and normal bilaterally.     DIAGNOSTIC DATA (LABS, IMAGING, TESTING) - I reviewed patient records, labs, notes, testing and imaging myself where available.  Lab Results  Component Value Date   WBC 6.4 12/10/2012   HGB 14.3 12/10/2012   HCT 43.4 12/10/2012   MCV 95.0 12/10/2012      Component Value Date/Time   NA 138 09/17/2012 1711   K 3.8 09/17/2012 1711   CL 101 09/17/2012 1711   CO2 26 09/17/2012 1711   GLUCOSE 83 09/17/2012 1711   BUN 13 09/17/2012 1711   CREATININE 0.81 09/17/2012 1711   CALCIUM 10.2 09/17/2012 1711   PROT 7.4 09/17/2012 1711   ALBUMIN 4.4 09/17/2012 1711   AST 15 09/17/2012 1711   ALT 26 09/17/2012 1711   ALKPHOS 76 09/17/2012 1711   BILITOT 0.5 09/17/2012 1711   Lab Results  Component Value Date   CHOL 265* 04/25/2012   HDL 48 04/25/2012   LDLCALC 141* 04/25/2012   TRIG 379* 04/25/2012   CHOLHDL 5.5  04/25/2012    Lab Results  Component Value Date   TSH 1.568 01/11/2012      ASSESSMENT AND PLAN 58 y.o. year old female  has a past medical history of Hypertension; Arnold-Chiari malformation (2006); Depression; Obesity; Vertigo; OSA (obstructive sleep apnea); Cervical strain; Migraine headache; Cervicogenic headache; CTS (carpal tunnel syndrome); Glucose intolerance (impaired glucose tolerance); and Postmenopausal. here with:  1. OSA on CPAP 2. Headache 3. Weakness on the right side 4. Obesity   Patient continues to have daytime sleepiness that she feels did not improve with CPAP. Patient notices new weakness in the right arm and feels that word finding issues are worsening. On exam patient was slightly weaker in the right arm and leg. I will check an MRI of the brain to look for any acute findings. I will also check an MRI of the cervical spine to look for causes of weakness as well as the pain associated with neck flexion. In the past patient has tired muscle relaxants for relief from the pressure on the back of her head but that was unsuccessful. I will call the patient with results from the MRI once available. Patient should follow-up in 3 months or sooner if needed.   Ward Givens, MSN, NP-C 02/04/2014, 1:34 PM [facility] [address], [place], [place] [address] [phone]  Note: This document was prepared with digital dictation and possible smart phrase technology. Any transcriptional errors that result from this process are unintentional.

## 2014-02-05 NOTE — Progress Notes (Signed)
I agree with the assessment and plan as directed by NP .The patient is known to me .   Breea Loncar, MD  

## 2014-02-16 ENCOUNTER — Ambulatory Visit
Admission: RE | Admit: 2014-02-16 | Discharge: 2014-02-16 | Disposition: A | Discharge status: Home / Self Care | Payer: BC Managed Care – PPO | Source: Ambulatory Visit | Attending: Adult Health | Admitting: Adult Health

## 2014-02-16 DIAGNOSIS — R531 Weakness: Secondary | ICD-10-CM

## 2014-02-16 DIAGNOSIS — R51 Headache: Secondary | ICD-10-CM

## 2014-02-16 DIAGNOSIS — M6281 Muscle weakness (generalized): Secondary | ICD-10-CM

## 2014-02-16 MED ORDER — GADOBENATE DIMEGLUMINE 529 MG/ML IV SOLN
20.0000 mL | Freq: Once | INTRAVENOUS | Status: AC | PRN
  Administered 2014-02-16: 20 mL via INTRAVENOUS

## 2014-02-22 ENCOUNTER — Telehealth: Payer: Self-pay | Admitting: Adult Health

## 2014-02-22 NOTE — Telephone Encounter (Signed)
Patient returning someone's call in triage regarding MRI results.  Please return call and leave message if not available.  Thanks

## 2014-02-23 NOTE — Progress Notes (Signed)
Quick Note:  Shared results thru VM message ______

## 2014-02-23 NOTE — Telephone Encounter (Signed)
Patient was given her results through vm, see note on MRI.

## 2014-02-23 NOTE — Progress Notes (Signed)
Quick Note:  Shared resulths thru VM message ______

## 2014-02-25 NOTE — Telephone Encounter (Signed)
Noted  

## 2014-04-07 ENCOUNTER — Telehealth: Payer: Self-pay | Admitting: Adult Health

## 2014-04-07 NOTE — Telephone Encounter (Signed)
Patient requesting sooner appointment to see Jinny Blossom since she is still having the same issues and they are getting very intense, please return call and advise.

## 2014-04-07 NOTE — Telephone Encounter (Signed)
Called patient she stated she had a lot going on and would like to discuss her results. Gave her a sooner appointment 04-08-2014 @ 10am

## 2014-04-07 NOTE — Telephone Encounter (Signed)
Calling patient because per Richeda patient was scheduled for appointment on tomorrow at 10 am with NP MM.

## 2014-04-08 ENCOUNTER — Encounter: Payer: Self-pay | Admitting: Adult Health

## 2014-04-08 ENCOUNTER — Ambulatory Visit (INDEPENDENT_AMBULATORY_CARE_PROVIDER_SITE_OTHER): Payer: BC Managed Care – PPO | Admitting: Adult Health

## 2014-04-08 VITALS — BP 146/89 | HR 82 | Ht 67.0 in | Wt 233.0 lb

## 2014-04-08 DIAGNOSIS — M6281 Muscle weakness (generalized): Secondary | ICD-10-CM

## 2014-04-08 DIAGNOSIS — Z9989 Dependence on other enabling machines and devices: Principal | ICD-10-CM

## 2014-04-08 DIAGNOSIS — R51 Headache: Secondary | ICD-10-CM

## 2014-04-08 DIAGNOSIS — G4733 Obstructive sleep apnea (adult) (pediatric): Secondary | ICD-10-CM

## 2014-04-08 DIAGNOSIS — R29898 Other symptoms and signs involving the musculoskeletal system: Secondary | ICD-10-CM

## 2014-04-08 MED ORDER — CYCLOBENZAPRINE HCL 5 MG PO TABS: 5.0000 mg | ORAL_TABLET | Freq: Every day | ORAL | Status: DC

## 2014-04-08 NOTE — Patient Instructions (Signed)
Cyclobenzaprine tablets What is this medicine? CYCLOBENZAPRINE (sye kloe BEN za preen) is a muscle relaxer. It is used to treat muscle pain, spasms, and stiffness. This medicine may be used for other purposes; ask your health care provider or pharmacist if you have questions. COMMON BRAND NAME(S): Fexmid, Flexeril What should I tell my health care provider before I take this medicine? They need to know if you have any of these conditions: -heart disease, irregular heartbeat, or previous heart attack -liver disease -thyroid problem -an unusual or allergic reaction to cyclobenzaprine, tricyclic antidepressants, lactose, other medicines, foods, dyes, or preservatives -pregnant or trying to get pregnant -breast-feeding How should I use this medicine? Take this medicine by mouth with a glass of water. Follow the directions on the prescription label. If this medicine upsets your stomach, take it with food or milk. Take your medicine at regular intervals. Do not take it more often than directed. Talk to your pediatrician regarding the use of this medicine in children. Special care may be needed. Overdosage: If you think you have taken too much of this medicine contact a poison control center or emergency room at once. NOTE: This medicine is only for you. Do not share this medicine with others. What if I miss a dose? If you miss a dose, take it as soon as you can. If it is almost time for your next dose, take only that dose. Do not take double or extra doses. What may interact with this medicine? Do not take this medicine with any of the following medications: -certain medicines for fungal infections like fluconazole, itraconazole, ketoconazole, posaconazole, voriconazole -cisapride -dofetilide -dronedarone -droperidol -flecainide -grepafloxacin -halofantrine -levomethadyl -MAOIs like Carbex, Eldepryl, Marplan, Nardil, and  Parnate -nilotinib -pimozide -probucol -sertindole -thioridazine -ziprasidone This medicine may also interact with the following medications: -abarelix -alcohol -certain medicines for cancer -certain medicines for depression, anxiety, or psychotic disturbances -certain medicines for infection like alfuzosin, chloroquine, clarithromycin, levofloxacin, mefloquine, pentamidine, troleandomycin -certain medicines for an irregular heart beat -certain medicines used for sleep or numbness during surgery or procedure -contrast dyes -dolasetron -guanethidine -methadone -octreotide -ondansetron -other medicines that prolong the QT interval (cause an abnormal heart rhythm) -palonosetron -phenothiazines like chlorpromazine, mesoridazine, prochlorperazine, thioridazine -tramadol -vardenafil This list may not describe all possible interactions. Give your health care provider a list of all the medicines, herbs, non-prescription drugs, or dietary supplements you use. Also tell them if you smoke, drink alcohol, or use illegal drugs. Some items may interact with your medicine. What should I watch for while using this medicine? Check with your doctor or health care professional if your condition does not improve within 1 to 3 weeks. You may get drowsy or dizzy when you first start taking the medicine or change doses. Do not drive, use machinery, or do anything that may be dangerous until you know how the medicine affects you. Stand or sit up slowly. Your mouth may get dry. Drinking water, chewing sugarless gum, or sucking on hard candy may help. What side effects may I notice from receiving this medicine? Side effects that you should report to your doctor or health care professional as soon as possible: -allergic reactions like skin rash, itching or hives, swelling of the face, lips, or tongue -chest pain -fast heartbeat -hallucinations -seizures -vomiting Side effects that usually do not require  medical attention (report to your doctor or health care professional if they continue or are bothersome): -headache This list may not describe all possible side effects. Call your doctor for   medical advice about side effects. You may report side effects to FDA at 1-800-FDA-1088. Where should I keep my medicine? Keep out of the reach of children. Store at room temperature between 15 and 30 degrees C (59 and 86 degrees F). Keep container tightly closed. Throw away any unused medicine after the expiration date. NOTE: This sheet is a summary. It may not cover all possible information. If you have questions about this medicine, talk to your doctor, pharmacist, or health care provider.  2015, [employer]vier/Gold Standard. (2013-02-03 12:48:19)  

## 2014-04-08 NOTE — Progress Notes (Signed)
PATIENT: Carrie Reynolds DOB: 24-May-1956  REASON FOR VISIT: follow up HISTORY FROM: patient  HISTORY OF PRESENT ILLNESS: Ms. Muela is a 58 year old female with a history of OSA on CPAP, right sided weakness and neck pain. Patient returns today for follow-up. She states that the pressure in the back of her head is still there. She continues to have weakness in the right hand. States that she often drops things. Denies numbness or tingling in that hand. She has had carpal tunnel surgery in the past. She states that using the CPAP has not affected the pressure in the back of her head. She normally wakes up with it around 3:00 am and has the day progresses it will slowly improved. She has had MRI of the brain and cervical spine that showed no changes from her previous scans.   HISTORY 02/07/14: 58 year old female with history of obstructive sleep Apnea for greater than 10 years. She returns today for a 30 day compliance download. She brought her machine with her today and the reports shows an AHI of 3.2 at 5-14cm of water on auto-titration with EPR 3, uses her machine for 5 hours and 46 minutes a night, with 92% compliance. Her Epworth score is 11 points was previously 12 points. Her fatigue severity score is 48 was previously 44. Patient reports that she gets about 6 hours of sleep a night. She goes to bed around 9:30pm and is usually woken up from pain in the back of her head around 3:30am. She denies having trouble falling asleep. Overall the patient does not feel that her sleepiness has improved with the CPAP. She continues to have pain the back of her head. It only occurs in the morning and will resolve by 10 am. She states that she is having word finding issues and it has progressive gotten worse. Patient states that her right eyebrow is drooping but that has been there for several years. She also feels that the right arm is weaker, when lifting objects. She states that the pain in  the back of her head is usually what wakes her up which affects her sleep. Word finding has become such an issue that it is affecting her performance at work. 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 Patient has lost 13 lbs since the last visit.   REVIEW OF SYSTEMS: Full 14 system review of systems performed and notable only for:  Constitutional: Excessive sweating Eyes: N/A Ear/Nose/Throat: Ringing in ears Skin: N/A  Cardiovascular: Palpitations Respiratory: N/A  Gastrointestinal: N/A  Genitourinary: N/A Hematology/Lymphatic: N/A  Endocrine: N/A Musculoskeletal: Neck pain  Allergy/Immunology: N/A  Neurological: Memory loss, dizziness, speech difficulty, weakness, facial drooping Psychiatric: N/A Sleep: Apnea   ALLERGIES: Allergies  Allergen Reactions  . Ceclor [Cefaclor] Hives    REACTION: rash  . Lipitor [Atorvastatin Calcium] Cough  . Lisinopril Cough  . Penicillins Swelling  . Simvastatin Cough  . Tetracycline     REACTION: rash  . Amoxicillin Rash    REACTION: rash  . Ampicillin Rash    REACTION: rash    HOME MEDICATIONS: Outpatient Prescriptions Prior to Visit  Medication Sig Dispense Refill  . ALPRAZolam (XANAX) 0.25 MG tablet Take 0.25 mg by mouth daily.      [name] AMLODIPINE BESYLATE PO Take by mouth.      [name] atorvastatin (LIPITOR) 40 MG tablet Take 40 mg by mouth daily.      [name]  buPROPion (WELLBUTRIN XL) 150 MG 24 hr tablet Take 150 mg by mouth daily.      . cloNIDine (CATAPRES) 0.1 MG tablet Take 0.1 mg by mouth daily as needed.       . etodolac (LODINE) 400 MG tablet Take 400 mg by mouth daily.      [name] losartan-hydrochlorothiazide (HYZAAR) 100-12.5 MG per tablet TAKE 1 TABLET BY MOUTH DAILY.  90 tablet  1  . metFORMIN (GLUCOPHAGE) 500 MG tablet Take 500 mg by mouth daily.      . montelukast (SINGULAIR) 10 MG tablet Take 1 tablet (10 mg total) by mouth at bedtime. PATIENT NEEDS OFFICE VISIT FOR ADDITIONAL REFILLS  30  tablet  0  . venlafaxine XR (EFFEXOR-XR) 37.5 MG 24 hr capsule Take 37.5 mg by mouth daily with breakfast.       No facility-administered medications prior to visit.    PAST MEDICAL HISTORY: Past Medical History  Diagnosis Date  . Hypertension   . Arnold-Chiari malformation 2006  . Depression   . Obesity   . Vertigo     post concussive  . OSA (obstructive sleep apnea)     not treated  . Cervical strain     syndrome  . Migraine headache     with visual aura  . Cervicogenic headache   . CTS (carpal tunnel syndrome)   . Glucose intolerance (impaired glucose tolerance)   . Postmenopausal     PAST SURGICAL HISTORY: Past Surgical History  Procedure Laterality Date  . Abdominal hysterectomy  2003    partial- L ovary remains  . Hand surgery    . Bunionectomy      right great toe  . Tubal ligation Bilateral     FAMILY HISTORY: Family History  Problem Relation Age of Onset  . Cancer Father     lung  . Alzheimer's disease Father   . Alzheimer's disease Paternal Grandmother   . Cancer Paternal Grandfather     lung    SOCIAL HISTORY: History   Social History  . Marital Status: Married    Spouse Name: Aitanna Haubner    Number of Children: 2  . Years of Education: 12   Occupational History  . 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 Smoking status: Former Smoker -- 0.10 packs/day for 30 years    Types: Cigarettes    Quit date: 04/26/2012  . Smokeless tobacco: Never Used  . Alcohol Use: Yes     Comment: occasional  . Drug Use: No  . Sexual Activity: Yes    Partners: Male    Birth Control/ Protection: Surgical   Other Topics Concern  . Not on file   Social History Narrative   Lives with husband [name]).   For her sleep apnea evaluation,  severe hypopneas.  just stopped smoking oct 3 rd. 2013 .    AHI of 68  was SPLIT but could not find a pressure that served her well in supine,  autotitrated CPAP between 8 and 12 cm water with nasal  pillows or whisp mask (claustrophobia) for 30 days ordered, based on  insurance, but she missed her appointmetn with DME- . Then will set to 95%. She had a 15 minute  visit with sleep clinic but did not bring the machine.   patient had 2 ATB treatments for coughing without success, viral or inflammatory? trial of prednisone.  PHYSICAL EXAM  Filed Vitals:   04/08/14 0953  BP: 146/89  Pulse: 82  Height: 5\' 7"  (1.702 m)  Weight: 233 lb (105.688 kg)   Body mass index is 36.48 kg/(m^2). Generalized: Well developed, in no acute distress   Neurological examination  Mentation: Alert oriented to time, place, history taking. Follows all commands speech and language fluent Cranial nerve II-XII: Pupils were equal round reactive to light. Extraocular movements were full, visual field were full on confrontational test. Facial sensation and strength were normal. . Head turning and shoulder shrug  were normal and symmetric. Motor: The motor testing reveals 5 over 5 strength of all 4 extremities. Grip strength normal and equal bilaterally. Good symmetric motor tone is noted throughout.  Sensory: Sensory testing is intact to soft touch on all 4 extremities. No evidence of extinction is noted.  Coordination: Cerebellar testing reveals good finger-nose-finger and heel-to-shin bilaterally.  Gait and station: Gait is normal. Tandem gait is normal. Romberg is negative. No drift is seen.  Reflexes: Deep tendon reflexes are symmetric and normal bilaterally.    DIAGNOSTIC DATA (LABS, IMAGING, TESTING) - I reviewed patient records, labs, notes, testing and imaging myself where available.  BRAIN MRI 02/17/14 IMPRESSION:  Equivocal MRI brain (with and without) demonstrating:  1. Few punctate foci of non-specific gliosis in the juxtacortical white matter. No abnormal lesions are seen on post contrast views.  2. No significant change from MRI on 04/03/11.  Cervical spine MRI  02/17/14: IMPRESSION:  Abnormal MRI cervical spine (with and without) demonstrating: 1. At C5-6: uncovertebral joint hypertrophy and disc bulging with moderate right and mild left foraminal stenosis  2. Disc bulging at C3-4, C4-5, C5-6. Small annular tears at C3-4 and C4-5 posteriorly.  3. No significant change from MRI on 04/03/11.    Lab Results  Component Value Date   WBC 6.4 12/10/2012   HGB 14.3 12/10/2012   HCT 43.4 12/10/2012   MCV 95.0 12/10/2012      Component Value Date/Time   NA 138 09/17/2012 1711   K 3.8 09/17/2012 1711   CL 101 09/17/2012 1711   CO2 26 09/17/2012 1711   GLUCOSE 83 09/17/2012 1711   BUN 13 09/17/2012 1711   CREATININE 0.81 09/17/2012 1711   CALCIUM 10.2 09/17/2012 1711   PROT 7.4 09/17/2012 1711   ALBUMIN 4.4 09/17/2012 1711   AST 15 09/17/2012 1711   ALT 26 09/17/2012 1711   ALKPHOS 76 09/17/2012 1711   BILITOT 0.5 09/17/2012 1711   Lab Results  Component Value Date   CHOL 265* 04/25/2012   HDL 48 04/25/2012   LDLCALC 141* 04/25/2012   TRIG 379* 04/25/2012   CHOLHDL 5.5 04/25/2012    Lab Results  Component Value Date   TSH 1.568 01/11/2012      ASSESSMENT AND PLAN 58 y.o. year old female  has a past medical history of Hypertension; Arnold-Chiari malformation (2006); Depression; Obesity; Vertigo; OSA (obstructive sleep apnea); Cervical strain; Migraine headache; Cervicogenic headache; CTS (carpal tunnel syndrome); Glucose intolerance (impaired glucose tolerance); and Postmenopausal. here with:  1. OSA apnea 2. Headache- pain in the back of the head 3. Right hand weakness  Patient continues to have pressure in the back of her head and right hand weakness. I will give her a trial on flexeril to see if that is beneficial for her head pressure. I will also do nerve conduction studies with EMG on the right hand to look for any cause of the right hand weakness. On exam  weakness in the right hand was not noted.  She continues to use her CPAP nightly. Patient  should follow-up in 4 months or sooner if needed.    Ward Givens, MSN, NP-C 04/08/2014, 10:10 AM [facility] [address], [place], [place] [address] [phone]  Note: This document was prepared with digital dictation and possible smart phrase technology. Any transcriptional errors that result from this process are unintentional.

## 2014-04-08 NOTE — Progress Notes (Signed)
I agree with the assessment and plan as directed by NP .The patient is known to me .   Charlis Harner, MD  

## 2014-04-21 ENCOUNTER — Encounter: Payer: BC Managed Care – PPO | Admitting: Neurology

## 2014-04-21 ENCOUNTER — Telehealth: Payer: Self-pay | Admitting: Neurology

## 2014-04-21 NOTE — Telephone Encounter (Signed)
This patient did not show for an EMG appointment today.

## 2014-04-22 ENCOUNTER — Encounter: Payer: BC Managed Care – PPO | Admitting: Radiology

## 2014-04-22 ENCOUNTER — Encounter: Payer: BC Managed Care – PPO | Admitting: Neurology

## 2014-05-06 ENCOUNTER — Ambulatory Visit: Payer: BC Managed Care – PPO | Admitting: Adult Health

## 2014-07-08 ENCOUNTER — Ambulatory Visit: Payer: BC Managed Care – PPO | Admitting: Adult Health

## 2014-07-29 ENCOUNTER — Other Ambulatory Visit: Payer: Self-pay | Admitting: Orthopedic Surgery

## 2014-07-29 DIAGNOSIS — M25532 Pain in left wrist: Secondary | ICD-10-CM

## 2014-08-10 ENCOUNTER — Ambulatory Visit
Admission: RE | Admit: 2014-08-10 | Discharge: 2014-08-10 | Disposition: A | Discharge status: Home / Self Care | Payer: BLUE CROSS/BLUE SHIELD | Source: Ambulatory Visit | Attending: Orthopedic Surgery | Admitting: Orthopedic Surgery

## 2014-08-10 DIAGNOSIS — M25532 Pain in left wrist: Secondary | ICD-10-CM

## 2014-08-10 MED ORDER — IOHEXOL 180 MG/ML  SOLN
2.5000 mL | Freq: Once | INTRAMUSCULAR | Status: AC | PRN
  Administered 2014-08-10: 2.5 mL via INTRA_ARTICULAR

## 2015-02-03 ENCOUNTER — Other Ambulatory Visit: Payer: Self-pay

## 2015-02-03 ENCOUNTER — Telehealth: Payer: Self-pay | Admitting: Neurology

## 2015-02-03 DIAGNOSIS — G4733 Obstructive sleep apnea (adult) (pediatric): Secondary | ICD-10-CM

## 2015-02-03 NOTE — Telephone Encounter (Signed)
Will fax order for supplies to [facility].

## 2015-02-07 ENCOUNTER — Telehealth: Payer: Self-pay

## 2015-02-07 DIAGNOSIS — G4733 Obstructive sleep apnea (adult) (pediatric): Secondary | ICD-10-CM

## 2015-02-07 NOTE — Telephone Encounter (Signed)
[facility] is requiring an appt to discuss cpap usage before they will give her new supplies. Called pt to make this appt, no answer, left message asking her to call me back. Please make a pt a 15 minute appt for cpap f/u if she calls back.

## 2015-02-08 NOTE — Telephone Encounter (Signed)
Patient returned call, I made her a follow up apt but she requested to speak with Cyril Mourning RN regarding CPAP equipment. Please call and advise.

## 2015-02-08 NOTE — Telephone Encounter (Signed)
Pt has a new appt 8/17 for a cpap f/u. Pt requesting that I fax the order for supplies and nasal pillows to [facility]. I informed her that I will do that, but they may still want office visit notes from Dr. Brett Fairy before they are able to give her supplies. Pt verbalized understanding.

## 2015-02-16 ENCOUNTER — Other Ambulatory Visit: Payer: Self-pay | Admitting: Orthopedic Surgery

## 2015-02-17 ENCOUNTER — Other Ambulatory Visit: Payer: Self-pay | Admitting: Orthopedic Surgery

## 2015-02-17 DIAGNOSIS — M25572 Pain in left ankle and joints of left foot: Secondary | ICD-10-CM

## 2015-02-17 DIAGNOSIS — M25561 Pain in right knee: Secondary | ICD-10-CM

## 2015-03-02 ENCOUNTER — Other Ambulatory Visit: Payer: BLUE CROSS/BLUE SHIELD

## 2015-03-09 ENCOUNTER — Encounter: Payer: Self-pay | Admitting: Adult Health

## 2015-03-09 ENCOUNTER — Ambulatory Visit (INDEPENDENT_AMBULATORY_CARE_PROVIDER_SITE_OTHER): Payer: BLUE CROSS/BLUE SHIELD | Admitting: Adult Health

## 2015-03-09 VITALS — BP 157/95 | HR 80 | Ht 67.0 in | Wt 240.0 lb

## 2015-03-09 DIAGNOSIS — M6289 Other specified disorders of muscle: Secondary | ICD-10-CM

## 2015-03-09 DIAGNOSIS — R29898 Other symptoms and signs involving the musculoskeletal system: Secondary | ICD-10-CM

## 2015-03-09 DIAGNOSIS — Z9989 Dependence on other enabling machines and devices: Principal | ICD-10-CM

## 2015-03-09 DIAGNOSIS — G4733 Obstructive sleep apnea (adult) (pediatric): Secondary | ICD-10-CM | POA: Diagnosis not present

## 2015-03-09 NOTE — Patient Instructions (Signed)
Continue using CPAP nightly I will reorder nerve conduction studies for right hand I will look over previous office notes regarding droopy eyelid.

## 2015-03-09 NOTE — Progress Notes (Signed)
PATIENT: Carrie Reynolds DOB: 11-22-1955  REASON FOR VISIT: follow up- OSA  HISTORY FROM: patient  HISTORY OF PRESENT ILLNESS: Carrie Reynolds is a 59 year old female with a history of obstructive sleep apnea on CPAP. She returns today for a compliance download. Her report shows that she has a residual AHI of 4.4 on a minimum pressure of 5 cm water and maximum pressure of 14 cm of water with EPR of 3. She uses her machine on average for 6 hours and 15 minutes with a compliance of 82%. She used her machine 74 out of 90 days. For 69 day she used her machine greater than 4 hours. The patient does not have a significant leak. She reports that the days that she did not use the machine was on the weekends and that is when [place]. She states that she typically does not take her machine with her for fear of leaving it there. Patient continues to report right hand weakness. Denies any numbness or tingling. She reports that occasionally she'll have a tremor in that hand. It is more prevalent in the mornings. She was set up for her nerve conduction study with EMG last September the patient does not recall this therefore she No showed for that appointment. The patient also reports that she will occasionally have a droopy eyelid on the right. Apparently this has been going on since she was a patient of Dr. Tressia Danas. She is unsure if there was ever any workup completed for this. She states that it has not gotten any worse and she has not developed any new symptoms. Denies any other fatigueable weakness. Denies any problems with chewing or swallowing. Although she does state that occasionally she'll have difficulty swallowing solid food. The patient's Epworth sleepiness scale is 7 and fatigue severity score is 30. She returns today for an evaluation.  HISTORY 04/08/14: Carrie Reynolds is a 59 year old female with a history of OSA on CPAP, right sided weakness and neck pain. Patient  returns today for follow-up. She states that the pressure in the back of her head is still there. She continues to have weakness in the right hand. States that she often drops things. Denies numbness or tingling in that hand. She has had carpal tunnel surgery in the past. She states that using the CPAP has not affected the pressure in the back of her head. She normally wakes up with it around 3:00 am and has the day progresses it will slowly improved. She has had MRI of the brain and cervical spine that showed no changes from her previous scans.   HISTORY 02/07/14: 59 year old female with history of obstructive sleep Apnea for greater than 10 years. She returns today for a 30 day compliance download. She brought her machine with her today and the reports shows an AHI of 3.2 at 5-14cm of water on auto-titration with EPR 3, uses her machine for 5 hours and 46 minutes a night, with 92% compliance. Her Epworth score is 11 points was previously 12 points. Her fatigue severity score is 48 was previously 44. Patient reports that she gets about 6 hours of sleep a night. She goes to bed around 9:30pm and is usually woken up from pain in the back of her head around 3:30am. She denies having trouble falling asleep. Overall the patient does not feel that her sleepiness has improved with the CPAP. She continues to have pain the back of her head. It  only occurs in the morning and will resolve by 10 am. She states that she is having word finding issues and it has progressive gotten worse. Patient states that her right eyebrow is drooping but that has been there for several years. She also feels that the right arm is weaker, when lifting objects. She states that the pain in the back of her head is usually what wakes her up which affects her sleep. Word finding has become such an issue that it is affecting her performance at work. 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 Patient has  lost 13 lbs since the last visit.   REVIEW OF SYSTEMS: Out of a complete 14 system review of symptoms, the patient complains only of the following symptoms, and all other reviewed systems are negative.  Ringing in ears, leg swelling, palpitations, excessive eating, apnea, joint pain, joint swelling, back pain, facial drooping  ALLERGIES: Allergies  Allergen Reactions  . Ceclor [Cefaclor] Hives    REACTION: rash  . Lipitor [Atorvastatin Calcium] Cough  . Lisinopril Cough  . Penicillins Swelling  . Simvastatin Cough  . Tetracycline     REACTION: rash  . Amoxicillin Rash    REACTION: rash  . Ampicillin Rash    REACTION: rash    HOME MEDICATIONS: Outpatient Prescriptions Prior to Visit  Medication Sig Dispense Refill  . ALPRAZolam (XANAX) 0.25 MG tablet Take 0.25 mg by mouth daily.    [name] AMLODIPINE BESYLATE PO Take by mouth.    [name] atorvastatin (LIPITOR) 40 MG tablet Take 40 mg by mouth daily.    [name] buPROPion (WELLBUTRIN XL) 150 MG 24 hr tablet Take 150 mg by mouth daily.    . cloNIDine (CATAPRES) 0.1 MG tablet Take 0.1 mg by mouth daily as needed.     . cyclobenzaprine (FLEXERIL) 5 MG tablet Take 1 tablet (5 mg total) by mouth at bedtime. 30 tablet 1  . etodolac (LODINE) 400 MG tablet Take 400 mg by mouth daily.    [name] losartan-hydrochlorothiazide (HYZAAR) 100-12.5 MG per tablet TAKE 1 TABLET BY MOUTH DAILY. 90 tablet 1  . metFORMIN (GLUCOPHAGE) 500 MG tablet Take 500 mg by mouth daily.    . montelukast (SINGULAIR) 10 MG tablet Take 1 tablet (10 mg total) by mouth at bedtime. PATIENT NEEDS OFFICE VISIT FOR ADDITIONAL REFILLS 30 tablet 0  . venlafaxine XR (EFFEXOR-XR) 37.5 MG 24 hr capsule Take 37.5 mg by mouth daily with breakfast.     No facility-administered medications prior to visit.    PAST MEDICAL HISTORY: Past Medical History  Diagnosis Date  . Hypertension   . Arnold-Chiari malformation 2006  . Depression   . Obesity   . Vertigo     post concussive  . OSA (obstructive  sleep apnea)     not treated  . Cervical strain     syndrome  . Migraine headache     with visual aura  . Cervicogenic headache   . CTS (carpal tunnel syndrome)   . Glucose intolerance (impaired glucose tolerance)   . Postmenopausal     PAST SURGICAL HISTORY: Past Surgical History  Procedure Laterality Date  . Abdominal hysterectomy  2003    partial- L ovary remains  . Hand surgery    . Bunionectomy      right great toe  . Tubal ligation Bilateral     FAMILY HISTORY: Family History  Problem Relation Age of Onset  .  Cancer Father     lung  . Alzheimer's disease Father   . Alzheimer's disease Paternal Grandmother   . Cancer Paternal Grandfather     lung    SOCIAL HISTORY: Social History   Social History  . Marital Status: Married    Spouse Name: Margeart Allender  . Number of Children: 2  . Years of Education: 12   Occupational History  . 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 Smoking status: Former Smoker -- 0.10 packs/day for 30 years    Types: Cigarettes    Quit date: 04/26/2012  . Smokeless tobacco: Never Used  . Alcohol Use: Yes     Comment: occasional  . Drug Use: No  . Sexual Activity:    Partners: Male    Birth Control/ Protection: Surgical   Other Topics Concern  . Not on file   Social History Narrative   Lives with husband [name]).   For her sleep apnea evaluation,  severe hypopneas.  just stopped smoking oct 3 rd. 2013 .    AHI of 68  was SPLIT but could not find a pressure that served her well in supine,  autotitrated CPAP between 8 and 12 cm water with nasal pillows or whisp mask (claustrophobia) for 30 days ordered, based on  insurance, but she missed her appointmetn with DME- . Then will set to 95%. She had a 15 minute  visit with sleep clinic but did not bring the machine.   patient had 2 ATB treatments for coughing without success, viral or inflammatory? trial of prednisone.                PHYSICAL  EXAM  Filed Vitals:   03/09/15 0943  BP: 157/95  Pulse: 80  Height: 5\' 7"  (1.702 m)  Weight: 240 lb (108.863 kg)   Body mass index is 37.58 kg/(m^2).  Generalized: Well developed, in no acute distress   Neurological examination  Mentation: Alert oriented to time, place, history taking. Follows all commands speech and language fluent Cranial nerve II-XII: Pupils were equal round reactive to light. Extraocular movements were full, visual field were full on confrontational test. Facial sensation and strength were normal. Uvula tongue midline. Head turning and shoulder shrug  were normal and symmetric. Motor: The motor testing reveals 5 over 5 strength of all 4 extremities. Good symmetric motor tone is noted throughout. Superior gaze for 1 minute with no ptosis. Both arms abducted from the body for 1 minute did not result in any weakness. Sensory: Sensory testing is intact to soft touch on all 4 extremities. No evidence of extinction is noted.  Coordination: Cerebellar testing reveals good finger-nose-finger and heel-to-shin bilaterally.  Gait and station: Gait is normal. Tandem gait is normal. Romberg is negative. No drift is seen.  Reflexes: Deep tendon reflexes are symmetric and normal bilaterally.   DIAGNOSTIC DATA (LABS, IMAGING, TESTING) - I reviewed patient records, labs, notes, testing and imaging myself where available.   ASSESSMENT AND PLAN 59 y.o. year old female  has a past medical history of Hypertension; Arnold-Chiari malformation (2006); Depression; Obesity; Vertigo; OSA (obstructive sleep apnea); Cervical strain; Migraine headache; Cervicogenic headache; CTS (carpal tunnel syndrome); Glucose intolerance (impaired glucose tolerance); and Postmenopausal. here with:  1. OSA on CPAP  The patient CPAP download was excellent. I have encouraged patient to use the machine nightly. She verbalized understanding. I will reorder the nerve conduction study with EMG on the right arm to  look for any cause of weakness. Although on exam there was no weakness noted. I will also look back at the patient's previous notes from Dr. love to review documentation and workup for ptosis. Patient will return in 6 months with Dr. Brett Fairy or sooner if needed.  Ward Givens, MSN, NP-C 03/09/2015, 9:39 AM [facility] [address], [place], [place] [address] [phone]

## 2015-03-09 NOTE — Progress Notes (Signed)
I agree with the assessment and plan as directed by NP .The patient is known to me .   Guilianna Mckoy, MD  

## 2015-03-16 ENCOUNTER — Ambulatory Visit: Payer: Self-pay | Admitting: Neurology

## 2015-04-06 ENCOUNTER — Encounter: Payer: Self-pay | Admitting: Neurology

## 2015-04-06 ENCOUNTER — Telehealth: Payer: Self-pay

## 2015-04-06 ENCOUNTER — Ambulatory Visit (INDEPENDENT_AMBULATORY_CARE_PROVIDER_SITE_OTHER): Payer: BLUE CROSS/BLUE SHIELD | Admitting: Neurology

## 2015-04-06 ENCOUNTER — Ambulatory Visit (INDEPENDENT_AMBULATORY_CARE_PROVIDER_SITE_OTHER): Payer: Self-pay | Admitting: Neurology

## 2015-04-06 DIAGNOSIS — M6289 Other specified disorders of muscle: Secondary | ICD-10-CM

## 2015-04-06 DIAGNOSIS — G4730 Sleep apnea, unspecified: Secondary | ICD-10-CM

## 2015-04-06 DIAGNOSIS — R29898 Other symptoms and signs involving the musculoskeletal system: Secondary | ICD-10-CM

## 2015-04-06 NOTE — Procedures (Signed)
     HISTORY:  Carrie Reynolds is a 59 year old patient with a history of some mild tremor involving the right upper extremity over the last one year. She has had prior carpal tunnel syndrome surgery on that side. She denies any pain in the arm, shoulder, or neck. She is being evaluated for the above symptoms.  NERVE CONDUCTION STUDIES:  Nerve conduction studies were performed on both upper extremities. The distal motor latencies and motor amplitudes for the median and ulnar nerves were within normal limits. The F wave latencies and nerve conduction velocities for these nerves were also normal. The sensory latencies for the median and ulnar nerves were normal.   EMG STUDIES:  EMG study was performed on the right upper extremity:  The first dorsal interosseous muscle reveals 2 to 4 K units with full recruitment. No fibrillations or positive waves were noted. The abductor pollicis brevis muscle reveals 2 to 4 K units with full recruitment. No fibrillations or positive waves were noted. The extensor indicis proprius muscle reveals 1 to 3 K units with full recruitment. No fibrillations or positive waves were noted. The pronator teres muscle reveals 2 to 3 K units with full recruitment. No fibrillations or positive waves were noted. The biceps muscle reveals 1 to 2 K units with full recruitment. No fibrillations or positive waves were noted. The triceps muscle reveals 2 to 4 K units with full recruitment. No fibrillations or positive waves were noted. The anterior deltoid muscle reveals 2 to 3 K units with full recruitment. No fibrillations or positive waves were noted. The cervical paraspinal muscles were tested at 2 levels. No abnormalities of insertional activity were seen at either level tested. There was good relaxation.   IMPRESSION:  Nerve conduction studies done on both upper extremities were within normal limits. No evidence of a neuropathy is seen. EMG evaluation of the right  upper extremity is unremarkable, no evidence of an overlying cervical radiculopathy is noted.  Jill Alexanders MD 04/06/2015 1:41 PM  [facility] [address], [place] [address]  Phone [phone] Fax [phone]

## 2015-04-06 NOTE — Progress Notes (Signed)
Please refer to EMG and nerve conduction study procedure note. 

## 2015-04-06 NOTE — Telephone Encounter (Signed)
Called pt to give lab results. No answer, left message asking pt to call back.

## 2015-04-06 NOTE — Telephone Encounter (Signed)
-----   Message from Ward Givens, NP sent at 04/06/2015  4:28 PM EDT ----- NCS with EMG is normal. Please call patient.

## 2015-04-07 ENCOUNTER — Telehealth: Payer: Self-pay

## 2015-04-07 NOTE — Telephone Encounter (Signed)
-----   Message from Ward Givens, NP sent at 04/06/2015  4:28 PM EDT ----- NCS with EMG is normal. Please call patient.

## 2015-04-07 NOTE — Telephone Encounter (Signed)
Called and spoke to patient relayed NCV/ EMG was normal. Patient understood process.

## 2015-05-16 ENCOUNTER — Ambulatory Visit
Admission: RE | Admit: 2015-05-16 | Discharge: 2015-05-16 | Disposition: A | Discharge status: Home / Self Care | Payer: BLUE CROSS/BLUE SHIELD | Source: Ambulatory Visit | Attending: Orthopedic Surgery | Admitting: Orthopedic Surgery

## 2015-05-16 DIAGNOSIS — M25572 Pain in left ankle and joints of left foot: Secondary | ICD-10-CM

## 2015-05-16 DIAGNOSIS — M25561 Pain in right knee: Secondary | ICD-10-CM

## 2015-06-30 ENCOUNTER — Other Ambulatory Visit: Payer: Self-pay | Admitting: Orthopedic Surgery

## 2015-07-01 ENCOUNTER — Other Ambulatory Visit: Payer: Self-pay | Admitting: Orthopedic Surgery

## 2015-07-01 DIAGNOSIS — R52 Pain, unspecified: Secondary | ICD-10-CM

## 2015-07-12 ENCOUNTER — Other Ambulatory Visit: Payer: BLUE CROSS/BLUE SHIELD

## 2015-08-22 ENCOUNTER — Telehealth: Payer: Self-pay

## 2015-08-22 ENCOUNTER — Ambulatory Visit: Payer: BLUE CROSS/BLUE SHIELD | Admitting: Neurology

## 2015-08-22 NOTE — Telephone Encounter (Signed)
Pt did not show for their appt with Dr. Dohmeier today.  

## 2015-08-23 ENCOUNTER — Encounter: Payer: Self-pay | Admitting: Neurology

## 2015-09-19 ENCOUNTER — Ambulatory Visit: Payer: BLUE CROSS/BLUE SHIELD | Admitting: Neurology

## 2015-11-21 HISTORY — PX: GASTRIC BYPASS: SHX52

## 2015-12-27 DIAGNOSIS — Z9884 Bariatric surgery status: Secondary | ICD-10-CM | POA: Insufficient documentation

## 2016-08-27 ENCOUNTER — Other Ambulatory Visit: Payer: Self-pay | Admitting: Family Medicine

## 2016-08-27 DIAGNOSIS — Z1231 Encounter for screening mammogram for malignant neoplasm of breast: Secondary | ICD-10-CM

## 2016-08-27 DIAGNOSIS — E2839 Other primary ovarian failure: Secondary | ICD-10-CM

## 2016-09-13 ENCOUNTER — Ambulatory Visit: Payer: BLUE CROSS/BLUE SHIELD

## 2016-09-13 ENCOUNTER — Other Ambulatory Visit: Payer: BLUE CROSS/BLUE SHIELD

## 2016-09-14 ENCOUNTER — Inpatient Hospital Stay: Admission: RE | Admit: 2016-09-14 | Payer: BLUE CROSS/BLUE SHIELD | Source: Ambulatory Visit

## 2016-09-14 ENCOUNTER — Ambulatory Visit: Payer: BLUE CROSS/BLUE SHIELD

## 2016-12-10 ENCOUNTER — Ambulatory Visit (INDEPENDENT_AMBULATORY_CARE_PROVIDER_SITE_OTHER): Payer: Managed Care, Other (non HMO)

## 2016-12-10 ENCOUNTER — Encounter (INDEPENDENT_AMBULATORY_CARE_PROVIDER_SITE_OTHER): Payer: Self-pay | Admitting: Orthopedic Surgery

## 2016-12-10 ENCOUNTER — Ambulatory Visit (INDEPENDENT_AMBULATORY_CARE_PROVIDER_SITE_OTHER): Payer: Managed Care, Other (non HMO) | Admitting: Orthopedic Surgery

## 2016-12-10 DIAGNOSIS — M542 Cervicalgia: Secondary | ICD-10-CM

## 2016-12-10 DIAGNOSIS — M5441 Lumbago with sciatica, right side: Secondary | ICD-10-CM

## 2016-12-10 DIAGNOSIS — M5442 Lumbago with sciatica, left side: Secondary | ICD-10-CM

## 2016-12-10 MED ORDER — OXYCODONE HCL 5 MG PO TABS: 5.0000 mg | ORAL_TABLET | Freq: Four times a day (QID) | ORAL | 0 refills | Status: DC | PRN
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
## 2016-12-11 ENCOUNTER — Ambulatory Visit
Admission: RE | Admit: 2016-12-11 | Discharge: 2016-12-11 | Disposition: A | Discharge status: Home / Self Care | Payer: 59 | Source: Ambulatory Visit | Attending: Orthopedic Surgery | Admitting: Orthopedic Surgery

## 2016-12-11 DIAGNOSIS — M5441 Lumbago with sciatica, right side: Secondary | ICD-10-CM

## 2016-12-11 DIAGNOSIS — M5442 Lumbago with sciatica, left side: Principal | ICD-10-CM

## 2016-12-12 ENCOUNTER — Telehealth (INDEPENDENT_AMBULATORY_CARE_PROVIDER_SITE_OTHER): Payer: Self-pay | Admitting: Orthopedic Surgery

## 2016-12-12 ENCOUNTER — Telehealth (INDEPENDENT_AMBULATORY_CARE_PROVIDER_SITE_OTHER): Payer: Self-pay | Admitting: *Deleted

## 2016-12-12 NOTE — Telephone Encounter (Signed)
Tried to call patient. No answer. LM advising was returning call.

## 2016-12-12 NOTE — Telephone Encounter (Signed)
Patient called in this morning with a lot of concerns. She is having increased pressure in her neck. She is also having weakness in her right hand and arm pain as well. She recently had a visit with Dr. Marlou Sa but this has just started to get progressively worse. She just wanted some advice if possible.  Her CB # [phone]. Thank you

## 2016-12-17 ENCOUNTER — Encounter (INDEPENDENT_AMBULATORY_CARE_PROVIDER_SITE_OTHER): Payer: Self-pay | Admitting: Orthopedic Surgery

## 2016-12-18 ENCOUNTER — Encounter: Payer: Self-pay | Admitting: Neurology

## 2016-12-18 ENCOUNTER — Telehealth (INDEPENDENT_AMBULATORY_CARE_PROVIDER_SITE_OTHER): Payer: Self-pay | Admitting: Radiology

## 2016-12-18 DIAGNOSIS — R29898 Other symptoms and signs involving the musculoskeletal system: Secondary | ICD-10-CM

## 2016-12-18 DIAGNOSIS — R42 Dizziness and giddiness: Secondary | ICD-10-CM

## 2016-12-18 NOTE — Telephone Encounter (Signed)
I called and spoke with patient about mychart message. Advised Dr. Marlou Sa had reviewed both MRI's and feels lumbar MRI would not show anything for upper extremity weakness. Advised that cervical MRI from 3 years ago was ok. Advised that Dr. Marlou Sa would like to refer to neurology to evaluate further pathology. Patient is in agreeable to this and would like to go to [facility]. Also would like to move up appointment for MRI review in further detail with Dr. Marlou Sa.

## 2016-12-18 NOTE — Telephone Encounter (Signed)
C spine 3 yrs ago ok - lspine findings wont explain upper extremity sxs may need further neck workup which if neg would then necessitate neurologyreferral -   with neg scan 3 yrs ago would rf to neurology pls call thx

## 2016-12-20 ENCOUNTER — Encounter (INDEPENDENT_AMBULATORY_CARE_PROVIDER_SITE_OTHER): Payer: Self-pay | Admitting: Orthopedic Surgery

## 2016-12-20 ENCOUNTER — Ambulatory Visit (INDEPENDENT_AMBULATORY_CARE_PROVIDER_SITE_OTHER): Payer: Managed Care, Other (non HMO) | Admitting: Orthopedic Surgery

## 2016-12-20 DIAGNOSIS — M5441 Lumbago with sciatica, right side: Secondary | ICD-10-CM | POA: Diagnosis not present

## 2016-12-20 NOTE — Progress Notes (Signed)
Office Visit Note   Patient: Carrie Reynolds           Date of Birth: [birth year]           MRN: [id number] Visit Date: 12/20/2016 Requested by: Hayden Rasmussen, MD [place], [place] [address] PCP: Hayden Rasmussen, MD  Subjective: Chief Complaint  Patient presents with  . Lower Back - Pain, Follow-up    HPI: Carrie Reynolds is a 61 year old patient here for review of MRI scan of her lumbar spine.  Since of [name] she's had some issues with balance.  She feels like her in her ear may be acting up again.  She has been to [facility] for treatment of this problem.she has actually fallen on several occasions and burned her left arm.  Medications prescribed for her back are helping but she still has some back pain which radiates down the right leg.  She also describes new-onset of right hand tremors.  She also describes neck pain radiating down the right arm.  She's never had cervical spine workup in the past.  Previous x-rays of the cervical spine were unremarkable for significant degenerative changes.              ROS: All systems reviewed are negative as they relate to the chief complaint within the history of present illness.  Patient denies  fevers or chills.   Assessment & Plan: Visit Diagnoses: No diagnosis found.  Plan: impression is improvement in the back with fairly minimal changes other than disc protrusion at L3-4 and L4-5.  These are very small disc protrusions.  Nothing really to account for the amount of symptoms she is having at her last clinic visit.  For this problem I like to send her to Dr. Gwendolyn Fill for consult and possible L-spine ESI.  For the neck we need MRI cervical spine to evaluate right-sided radiculopathy.  She has neurology appointment in July.  I don't really want her doing any driving or flying do this dizziness issue for the next 4 weeks.  I'll see her bac  Follow-Up Instructions: Return for after MRI.   Orders:  No orders of the defined types were placed in this  encounter.  No orders of the defined types were placed in this encounter.     Procedures: No procedures performed   Clinical Data: No additional findings.  Objective: Vital Signs: There were no vitals taken for this visit.  Physical Exam:   Constitutional: Patient appears well-developed HEENT:  Head: Normocephalic Eyes:EOM are normal Neck: Normal range of motion Cardiovascular: Normal rate Pulmonary/chest: Effort normal Neurologic: Patient is alert Skin: Skin is warm Psychiatric: Patient has normal mood and affect    Ortho Exam: orthopedic exam demonstrates good cervical spine range of motion with some reproduction of symptoms with rotation of the head to the right.  She doesn't have any paresthesias C5 T1 palpable radial pulse bilaterally.  Pretty good grip EPL FPL interosseous wrist flexion-extension biceps triceps and deltoid strength.  She has no real upper motor neuron symptoms in the upper chemise.  Reflexes symmetric.  Bilateral upper chemise demonstrates goodEHL ankle dorsi flexion plantar flexion quite after strength and no definite paresthesias L1 S1.  Does have nerve retention signs on the right negative on the left.  Negative Babinski negative clonus bilaterally.  Specialty Comments:  No specialty comments available.  Imaging: No results found.   PMFS History: Patient Active Problem List   Diagnosis Date Noted  . OSA on CPAP  11/19/2013  . Obesity hypoventilation syndrome ([place]) 11/19/2013  . Sleep apnea 04/25/2012  . Obesity 04/25/2012  . Depression 04/25/2012  . Environmental allergies 04/25/2012  . Hypertension 04/25/2012  . Hypercholesteremia 04/25/2012  . Postmenopausal 09/18/2011  . LEG EDEMA 03/16/2010   Past Medical History:  Diagnosis Date  . Arnold-Chiari malformation ([place]) 2006  . Cervical strain    syndrome  . Cervicogenic headache   . CTS (carpal tunnel syndrome)   . Depression   . Glucose intolerance (impaired glucose tolerance)     . Hypertension   . Migraine headache    with visual aura  . Obesity   . OSA (obstructive sleep apnea)    not treated  . Postmenopausal   . Vertigo    post concussive    Family History  Problem Relation Age of Onset  . Cancer Father        lung  . Alzheimer's disease Father   . Alzheimer's disease Paternal Grandmother   . Cancer Paternal Grandfather        lung    Past Surgical History:  Procedure Laterality Date  . ABDOMINAL HYSTERECTOMY  2003   partial- L ovary remains  . BUNIONECTOMY     right great toe  . HAND SURGERY    . TUBAL LIGATION Bilateral    Social History   Occupational History  . MANAGER Designer, multimedia, warehouse   Social History Main Topics  . Smoking status: Former Smoker    Packs/day: 0.10    Years: 30.00    Types: Cigarettes    Quit date: 04/26/2012  . Smokeless tobacco: Never Used  . Alcohol use Yes     Comment: occasional  . Drug use: No  . Sexual activity: Yes    Partners: Male    Birth control/ protection: Surgical

## 2016-12-26 ENCOUNTER — Ambulatory Visit (INDEPENDENT_AMBULATORY_CARE_PROVIDER_SITE_OTHER): Payer: Managed Care, Other (non HMO) | Admitting: Orthopedic Surgery

## 2017-01-08 ENCOUNTER — Encounter (INDEPENDENT_AMBULATORY_CARE_PROVIDER_SITE_OTHER): Payer: Managed Care, Other (non HMO) | Admitting: Physical Medicine and Rehabilitation

## 2017-01-11 ENCOUNTER — Other Ambulatory Visit (INDEPENDENT_AMBULATORY_CARE_PROVIDER_SITE_OTHER): Payer: Self-pay | Admitting: Orthopedic Surgery

## 2017-01-11 ENCOUNTER — Encounter (INDEPENDENT_AMBULATORY_CARE_PROVIDER_SITE_OTHER): Payer: Self-pay | Admitting: Orthopedic Surgery

## 2017-01-11 MED ORDER — OXYCODONE HCL 5 MG PO TABS: 5.0000 mg | ORAL_TABLET | Freq: Three times a day (TID) | ORAL | 0 refills | Status: DC | PRN

## 2017-01-11 NOTE — Telephone Encounter (Signed)
y

## 2017-01-16 ENCOUNTER — Encounter (INDEPENDENT_AMBULATORY_CARE_PROVIDER_SITE_OTHER): Payer: Self-pay

## 2017-01-28 ENCOUNTER — Inpatient Hospital Stay: Admission: RE | Admit: 2017-01-28 | Payer: 59 | Source: Ambulatory Visit

## 2017-02-12 ENCOUNTER — Telehealth (INDEPENDENT_AMBULATORY_CARE_PROVIDER_SITE_OTHER): Payer: Self-pay

## 2017-02-12 NOTE — Telephone Encounter (Signed)
Received fax from [employer] requesting notes from 12/08/16 (doi) to present so they can evaluate claim. Faxed work note, injection referral, and office notes

## 2017-03-18 ENCOUNTER — Ambulatory Visit: Payer: 59 | Admitting: Neurology

## 2017-03-18 ENCOUNTER — Other Ambulatory Visit: Payer: Self-pay | Admitting: Neurosurgery

## 2017-03-18 DIAGNOSIS — M542 Cervicalgia: Secondary | ICD-10-CM

## 2017-03-18 DIAGNOSIS — R251 Tremor, unspecified: Secondary | ICD-10-CM

## 2017-03-18 DIAGNOSIS — R2681 Unsteadiness on feet: Secondary | ICD-10-CM

## 2017-04-08 ENCOUNTER — Ambulatory Visit (INDEPENDENT_AMBULATORY_CARE_PROVIDER_SITE_OTHER): Payer: Managed Care, Other (non HMO) | Admitting: Family Medicine

## 2017-04-08 ENCOUNTER — Encounter: Payer: Self-pay | Admitting: Family Medicine

## 2017-04-08 VITALS — BP 158/88 | HR 64 | Temp 97.9°F | Resp 17 | Ht 66.5 in | Wt 183.0 lb

## 2017-04-08 DIAGNOSIS — R002 Palpitations: Secondary | ICD-10-CM

## 2017-04-08 DIAGNOSIS — E782 Mixed hyperlipidemia: Secondary | ICD-10-CM | POA: Diagnosis not present

## 2017-04-08 DIAGNOSIS — R300 Dysuria: Secondary | ICD-10-CM | POA: Diagnosis not present

## 2017-04-08 DIAGNOSIS — I10 Essential (primary) hypertension: Secondary | ICD-10-CM

## 2017-04-08 DIAGNOSIS — Z131 Encounter for screening for diabetes mellitus: Secondary | ICD-10-CM | POA: Diagnosis not present

## 2017-04-08 DIAGNOSIS — Z9884 Bariatric surgery status: Secondary | ICD-10-CM

## 2017-04-08 DIAGNOSIS — Z23 Encounter for immunization: Secondary | ICD-10-CM | POA: Diagnosis not present

## 2017-04-08 LAB — POCT URINALYSIS DIP (MANUAL ENTRY)
BILIRUBIN UA: NEGATIVE mg/dL
Bilirubin, UA: NEGATIVE
Glucose, UA: NEGATIVE mg/dL
LEUKOCYTES UA: NEGATIVE
Nitrite, UA: NEGATIVE
Spec Grav, UA: >=1.03 — AB (ref 1.010–1.025)
UROBILINOGEN UA: 0.2 U/dL
pH, UA: 6 (ref 5.0–8.0)

## 2017-04-08 MED ORDER — METOPROLOL SUCCINATE ER 25 MG PO TB24: 25.0000 mg | ORAL_TABLET | Freq: Every day | ORAL | 0 refills | Status: DC

## 2017-04-08 MED ORDER — SULFAMETHOXAZOLE-TRIMETHOPRIM 800-160 MG PO TABS: 1.0000 | ORAL_TABLET | Freq: Two times a day (BID) | ORAL | 0 refills | Status: AC

## 2017-04-08 NOTE — Patient Instructions (Addendum)
IF you received an x-ray today, you will receive an invoice from [facility]. Please contact [facility] at [phone] with questions or concerns regarding your invoice.   IF you received labwork today, you will receive an invoice from [facility]. Please contact [facility] at [phone] with questions or concerns regarding your invoice.   Our billing staff will not be able to assist you with questions regarding bills from these companies.  You will be contacted with the lab results as soon as they are available. The fastest way to get your results is to activate your My Chart account. Instructions are located on the last page of this paperwork. If you have not heard from [place] regarding the results in 2 weeks, please contact this office.    Hib Vaccine (Haemophilus Influenzae Type b): What You Need to Know 1. Why get vaccinated? Haemophilus influenzae type b (Hib) disease is a serious disease caused by bacteria. It usually affects children under 5 years old. It can also affect adults with certain medical conditions. Your child can get Hib disease by being around other children or adults who may have the bacteria and not know it. The germs spread from person to person. If the germs stay in the child's nose and throat, the child probably will not get sick. But sometimes the germs spread into the lungs or the bloodstream, and then Hib can cause serious problems. This is called invasive Hib disease. Before Hib vaccine, Hib disease was the leading cause of bacterial meningitis among children under 34 years old in the [place]. Meningitis is an infection of the lining of the brain and spinal cord. It can lead to brain damage and deafness. Hib disease can also cause:  pneumonia  severe swelling in the throat, making it hard to breathe  infections of the blood, joints, bones, and covering of the heart  death  Before Hib vaccine, about 20,000 children in the [place]  under 72 years old got Hib disease each year, and about 3%-6% of them died. Hib vaccine can prevent Hib disease. Since use of Hib vaccine began, the number of cases of invasive Hib disease has decreased by more than 99%. Many more children would get Hib disease if we stopped vaccinating. 2. Hib vaccine Several different brands of Hib vaccine are available. Your child will receive either 3 or 4 doses, depending on which vaccine is used. Doses of Hib vaccine are usually recommended at these ages:  First dose: 22 months of age  Second dose: 93 months of age  Third dose: 24 months of age (if needed, depending on brand of vaccine)  Final/booster dose: 2-72 months of age  Hib vaccine may be given at the same time as other vaccines. Hib vaccine may be given as part of a combination vaccine. Combination vaccines are made when two or more types of vaccine are combined together into a single shot, so that one vaccination can protect against more than one disease. Children over 39 years old and adults usually do not need Hib vaccine. But it may be recommended for older children or adults with asplenia or sickle cell disease, before surgery to remove the spleen, or following a bone marrow transplant. It may also be recommended for people 86 to 60 years old with HIV. Ask your doctor for details. Your doctor or the person giving you the vaccine can give you more information. 3. Some people should not get this vaccine Hib vaccine should not be given  to infants younger than 56 weeks of age. A person who has ever had a life-threatening allergic reaction after a previous dose of Hib vaccine, OR has a severe allergy to any part of this vaccine, should not get Hib vaccine. Tell the person giving the vaccine about any severe allergies. People who are mildly ill can get Hib vaccine. People who are moderately or severely ill should probably wait until they recover. Talk to your healthcare provider if the person getting the  vaccine isn't feeling well on the day the shot is scheduled. 4. Risks of a vaccine reaction With any medicine, including vaccines, there is a chance of side effects. These are usually mild and go away on their own. Serious reactions are also possible but are rare. Most people who get Hib vaccine do not have any problems with it. Mild problems following Hib vaccine:  redness, warmth, or swelling where the shot was given  fever  These problems are uncommon. If they occur, they usually begin soon after the shot and last 2 or 3 days. Problems that could happen after any vaccine: Any medication can cause a severe allergic reaction. Such reactions from a vaccine are very rare, estimated at fewer than 1 in a million doses, and would happen within a few minutes to a few hours after the vaccination. As with any medicine, there is a very remote chance of a vaccine causing a serious injury or death. Older children, adolescents, and adults might also experience these problems after any vaccine:  People sometimes faint after a medical procedure, including vaccination. Sitting or lying down for about 15 minutes can help prevent fainting, and injuries caused by a fall. Tell your doctor if you feel dizzy, or have vision changes or ringing in the ears.  Some people get severe pain in the shoulder and have difficulty moving the arm where a shot was given. This happens very rarely.  The safety of vaccines is always being monitored. For more information, visit: http://www.aguilar.org/ 5. What if there is a serious reaction? What should I look for? Look for anything that concerns you, such as signs of a severe allergic reaction, very high fever, or unusual behavior. Signs of a severe allergic reaction can include hives, swelling of the face and throat, difficulty breathing, a fast heartbeat, dizziness, and weakness. These would usually start a few minutes to a few hours after the vaccination. What should I  do?  If you think it is a severe allergic reaction or other emergency that can't wait, call 9-1-1 or get the person to the nearest hospital. Otherwise, call your doctor.  Afterward, the reaction should be reported to the Vaccine Adverse Event Reporting System (VAERS). Your doctor might file this report, or you can do it yourself through the VAERS web site at www.vaers.SamedayNews.es, or by calling [phone]. ? VAERS does not give medical advice. 6. The National Vaccine Injury Compensation Program The [employer] Vaccine Injury Compensation Program (VICP) is a federal program that was created to compensate people who may have been injured by certain vaccines. Persons who believe they may have been injured by a vaccine can learn about the program and about filing a claim by calling 703-058-8621 or visiting the [place] website at GoldCloset.com.ee. There is a time limit to file a claim for compensation. 7. How can I learn more?  Ask your doctor. He or she can give you the vaccine package insert or suggest other sources of information.  Call your local or state health department.  Contact the Centers for Disease Control and Prevention (CDC): ? Call 5073804814 (1-800-CDC-INFO) or ? Visit CDC's website at http://hunter.com/ CDC Haemophilus Influenzae Type b (Hib) Vaccine VIS (10/22/13) This information is not intended to replace advice given to you by your health care provider. Make sure you discuss any questions you have with your health care provider. Document Released: 05/06/2006 Document Revised: 03/29/2016 Document Reviewed: 03/29/2016 Elsevier Interactive Patient Education  2017 [employer].

## 2017-04-08 NOTE — Progress Notes (Signed)
Chief Complaint  Patient presents with  . Urinary Tract Infection    possible uti, onset: x 1 week, tried AZO with no relief    HPI   Urinary Tract Infection: Patient complains of burning with urination, dysuria, frequency and urgency She has had symptoms for 6 days. Patient also complains of back pain. Patient denies fever and vaginal discharge. Patient does not have a history of recurrent UTI.  Patient does not have a history of pyelonephritis.   She last took Azo about 3 days ago  Uncontrolled hypertension  Palpitations Pt reports that she has had palpitations for years. She remembers that she even went to the ER because he palpitations were so severe they took her breath away She had an ecg and was discharged homed In 2015 she also had palpitations and was worked up by [facility] in [place] She states that she always has hypertension and that her bp has been difficult to control. In 2017 she had gastric bypass surgery and lost a lot of weight Wt Readings from Last 3 Encounters:  04/08/17 183 lb (83 kg)  03/09/15 240 lb (108.9 kg)  04/08/14 233 lb (105.7 kg)   She no longer eats meat only fish She states that she is surprised that her bp has not improved despite all the weight loss She is complaint with her medications She travels 75% of the time for work   Past Medical History:  Diagnosis Date  . Arnold-Chiari malformation ([place]) 2006  . Cervical strain    syndrome  . Cervicogenic headache   . CTS (carpal tunnel syndrome)   . Depression   . Glucose intolerance (impaired glucose tolerance)   . Hypertension   . Migraine headache    with visual aura  . Obesity   . OSA (obstructive sleep apnea)    not treated  . Postmenopausal   . Vertigo    post concussive    Current Outpatient Prescriptions  Medication Sig Dispense Refill  . losartan-hydrochlorothiazide (HYZAAR) 100-12.5 MG per tablet TAKE 1 TABLET BY MOUTH DAILY. 90 tablet 1  . ALPRAZolam (XANAX)  0.25 MG tablet Take 0.25 mg by mouth daily.    [name] AMLODIPINE BESYLATE PO Take by mouth.    [name] atorvastatin (LIPITOR) 40 MG tablet Take 40 mg by mouth daily.    [name] buPROPion (WELLBUTRIN XL) 150 MG 24 hr tablet Take 150 mg by mouth daily.    . cloNIDine (CATAPRES) 0.1 MG tablet Take 0.1 mg by mouth daily as needed.     . etodolac (LODINE) 400 MG tablet Take 400 mg by mouth daily.    . metFORMIN (GLUCOPHAGE) 500 MG tablet Take 500 mg by mouth daily.    [name] oxyCODONE (OXY IR/ROXICODONE) 5 MG immediate release tablet Take 1 tablet (5 mg total) by mouth every 8 (eight) hours as needed for severe pain. (Patient not taking: Reported on 04/08/2017) 40 tablet 0  . sulfamethoxazole-trimethoprim (BACTRIM DS) 800-160 MG tablet Take 1 tablet by mouth 2 (two) times daily. 10 tablet 0  . venlafaxine XR (EFFEXOR-XR) 37.5 MG 24 hr capsule Take 37.5 mg by mouth daily with breakfast.     No current facility-administered medications for this visit.     Allergies:  Allergies  Allergen Reactions  . Ceclor [Cefaclor] Hives    REACTION: rash  . Tetracyclines & Related Hives  . Lipitor [Atorvastatin Calcium] Cough  . Penicillins Swelling  . Tetracycline     REACTION: rash  . Amoxicillin Rash  REACTION: rash  . Ampicillin Rash    REACTION: rash  . Atorvastatin Cough  . Lisinopril Cough  . Simvastatin Cough    Past Surgical History:  Procedure Laterality Date  . ABDOMINAL HYSTERECTOMY  2003   partial- L ovary remains  . BUNIONECTOMY     right great toe  . HAND SURGERY    . TUBAL LIGATION Bilateral     Social History   Social History  . Marital status: Married    Spouse name: Eris Hannan  . Number of children: 2  . Years of education: 12   Occupational History  . MANAGER Designer, multimedia, warehouse   Social History Main Topics  . Smoking status: Former Smoker    Packs/day: 0.10    Years: 30.00    Types: Cigarettes    Quit date: 04/26/2012  . Smokeless tobacco: Never  Used  . Alcohol use Yes     Comment: occasional  . Drug use: No  . Sexual activity: Yes    Partners: Male    Birth control/ protection: Surgical   Other Topics Concern  . None   Social History Narrative   Lives with husband [name]).   For her sleep apnea evaluation,  severe hypopneas.  just stopped smoking oct 3 rd. 2013 .    AHI of 69  was SPLIT but could not find a pressure that served her well in supine,  autotitrated CPAP between 8 and 12 cm water with nasal pillows or whisp mask (claustrophobia) for 30 days ordered, based on  insurance, but she missed her appointmetn with DME- . Then will set to 95%. She had a 15 minute  visit with sleep clinic but did not bring the machine.   patient had 2 ATB treatments for coughing without success, viral or inflammatory? trial of prednisone.              ROS  See hpi   Objective: Vitals:   04/08/17 1631  BP: (!) 158/88  Pulse: 64  Resp: 17  Temp: 97.9 F (36.6 C)  TempSrc: Oral  SpO2: 94%  Weight: 183 lb (83 kg)  Height: 5' 6.5" (1.689 m)   BP Readings from Last 3 Encounters:  04/08/17 (!) 158/88  03/09/15 (!) 157/95  04/08/14 (!) 146/89    Physical Exam Physical Exam  Constitutional: She is oriented to person, place, and time. She appears well-developed and well-nourished.  HENT:  Head: Normocephalic and atraumatic.  Eyes: Conjunctivae and EOM are normal.  Cardiovascular: Normal rate, regular rhythm and normal heart sounds.   Pulmonary/Chest: Effort normal and breath sounds normal. No respiratory distress. She has no wheezes.  Abdominal: Normal appearance and bowel sounds are normal. There is no tenderness. There is no CVA tenderness.  Neurological: She is alert and oriented to person, place, and time.    Assessment and Plan Truly was seen today for urinary tract infection.  Diagnoses and all orders for this visit:  Dysuria- will treat empirically for uti with Bactrim -     POCT urinalysis dipstick -     Cancel:  POCT Microscopic Urinalysis ([facility]) -     Urine Culture  Palpitations- referral placed for Cardiology -     Hemoglobin A1c; Future -     CBC; Future  Mixed hyperlipidemia- discussed her previous cholesterol levels -     TSH; Future -     Lipid panel; Future -     Comprehensive metabolic panel; Future  Uncontrolled  hypertension- discussed with patient that she should take a beta blocker to improve bp and help with palpitations She should call if her pulse drops below 50  S/P bariatric surgery- will screen for diabetes and anemia -     Hemoglobin A1c; Future  Screening for diabetes mellitus -     Hemoglobin A1c; Future  Need for influenza vaccination -     Flu Vaccine QUAD 36+ mos IM  Other orders -     sulfamethoxazole-trimethoprim (BACTRIM DS) 800-160 MG tablet; Take 1 tablet by mouth 2 (two) times daily.     [place]

## 2017-04-09 LAB — URINE CULTURE: Organism ID, Bacteria: NO GROWTH

## 2017-04-16 ENCOUNTER — Encounter: Payer: Self-pay | Admitting: Cardiology

## 2017-04-16 ENCOUNTER — Ambulatory Visit (INDEPENDENT_AMBULATORY_CARE_PROVIDER_SITE_OTHER): Payer: Managed Care, Other (non HMO) | Admitting: Cardiology

## 2017-04-16 VITALS — BP 144/80 | HR 56 | Ht 66.5 in | Wt 185.8 lb

## 2017-04-16 DIAGNOSIS — R011 Cardiac murmur, unspecified: Secondary | ICD-10-CM | POA: Diagnosis not present

## 2017-04-16 DIAGNOSIS — I10 Essential (primary) hypertension: Secondary | ICD-10-CM

## 2017-04-16 DIAGNOSIS — G4733 Obstructive sleep apnea (adult) (pediatric): Secondary | ICD-10-CM

## 2017-04-16 DIAGNOSIS — R002 Palpitations: Secondary | ICD-10-CM

## 2017-04-16 DIAGNOSIS — R55 Syncope and collapse: Secondary | ICD-10-CM

## 2017-04-16 DIAGNOSIS — Z9989 Dependence on other enabling machines and devices: Secondary | ICD-10-CM

## 2017-04-16 NOTE — Progress Notes (Signed)
PCP: Carrie Reynolds  Clinic Note: Chief Complaint  Patient presents with  . New Patient (Initial Visit)    Palpitations, near syncope    HPI: Carrie Reynolds is Reynolds 61 y.o. female who is being seen today for the evaluation of Heart Murmur at the request of Carrie Reynolds, MDAlso noted episode 3 wks ago that she wanted evaluated. She is producing evaluated but [facility] Carrie Reynolds) in 2015 for palpitations. She has had history of labile blood pressures. She was formally quite obese having been 240 pounds as late as August 2016 - she no longer eats meat only eats fish. She is status post GOP @ [facility] (as Reynolds part of her pre-op evaluation, she was seen by Carrie Reynolds.)  Carrie Reynolds was seen by her PCP on symptom or 17th for dysuria complaints. She also noted palpitations.  Recent Hospitalizations: None  Studies Personally Reviewed - (if available, images/films reviewed: From Epic Chart or Reynolds Everywhere)    Interval History: Carrie Reynolds presents today for cardiology evaluation for palpitations/rapid heart beats and skipped beats. She indicates that she has Reynolds very stressful job and is always on the go traveling. She works 5 AM to 11 PM. While traveling (~3 weeks ago) she went to the emergency room down in [place] for skipped heartbeats, lightheadedness and near-syncope. She felt short of breath with some discomfort between her shoulder blades. She pulled over to side of the road and and decided to go to the emergency room [place]. After Reynolds basic evaluation emergency room, the upshot of evaluation for her was that she take over she had Reynolds panic attack. She didn't have Reynolds lot of the other symptoms that he would expect Reynolds panic attack including sense of doom and then closing pressure.  She did note was feeling skipped heartbeats or irregular beats that seem to be going relatively fast. She felt Reynolds little lightheaded and nauseated. She did feel short of breath with it. She  also noted Reynolds sense of it aggravated discomfort in between her shoulders and the back was present during the whole experience and actually continued after her emergency room visit. Since then she's noticed similar episodes but not as long and less severe.  She has Reynolds hard time really describing irregular heartbeats to me, and it sounds like they were spells intermittently as opposed to prolonged episode. Her symptom which would emergency room about 30 minutes long.  No chest pain or shortness of breath with rest or exertion.  Although she did feel some near-syncope, she denied any true syncope.  No TIA/amaurosis fugax symptoms.  No claudication.  ROS: Reynolds comprehensive was performed. Review of Systems  Constitutional: Positive for malaise/fatigue (Mostly because she is always on the go). Negative for chills and fever.  HENT: Negative for congestion and nosebleeds.   Respiratory: Negative for cough, shortness of breath and wheezing.   Gastrointestinal: Negative for blood in stool and constipation.  Genitourinary: Negative for dysuria, frequency, hematuria and urgency.       Resolving dysuria symptoms  Musculoskeletal: Positive for joint pain (Mild arthritis pain).  Neurological: Negative for focal weakness and weakness.  Endo/Heme/Allergies: Negative for environmental allergies.  Psychiatric/Behavioral: Negative for memory loss. The patient does not have insomnia.   All other systems reviewed and are negative.  I have reviewed and (if needed) personally updated the patient's problem list, medications, allergies, past medical and surgical history, social and family history.   Past Medical History:  Diagnosis Date  . Arnold-Chiari malformation ([place]) 2006  . Cervical strain    syndrome  . Cervicogenic headache   . CTS (carpal tunnel syndrome)   . Depression   . Glucose intolerance (impaired glucose tolerance)   . Hypertension   . Migraine headache    with visual aura  . Obesity   . OSA  (obstructive sleep apnea)    not treated  . Postmenopausal   . Vertigo    post concussive    Past Surgical History:  Procedure Laterality Date  . ABDOMINAL HYSTERECTOMY  2003   partial- L ovary remains  . BUNIONECTOMY     right great toe  . GASTRIC BYPASS  11/2015   [facility]  . HAND SURGERY    . TUBAL LIGATION Bilateral     Current Meds  Medication Sig  . losartan-hydrochlorothiazide (HYZAAR) 100-12.5 MG per tablet TAKE 1 TABLET BY MOUTH DAILY.    Allergies  Allergen Reactions  . Ceclor [Cefaclor] Hives    REACTION: rash  . Tetracyclines & Related Hives  . Lipitor [Atorvastatin Calcium] Cough  . Penicillins Swelling  . Tetracycline     REACTION: rash  . Amoxicillin Rash    REACTION: rash  . Ampicillin Rash    REACTION: rash  . Atorvastatin Cough  . Lisinopril Cough  . Simvastatin Cough    Social History   Social History  . Marital status: Married    Spouse name: Carrie Reynolds  . Number of children: 2  . Years of education: 12   Occupational History  . MANAGER Designer, multimedia, warehouse   Social History Main Topics  . Smoking status: Former Smoker    Packs/day: 0.10    Years: 30.00    Types: Cigarettes    Quit date: 04/26/2012  . Smokeless tobacco: Never Used  . Alcohol use Yes     Comment: occasional  . Drug use: No  . Sexual activity: Yes    Partners: Male    Birth control/ protection: Surgical   Other Topics Concern  . None   Social History Narrative   Lives with husband Carrie Reynolds).   She travels very frequently with her job and is gone most weeks out of the month.   She is active and exercises when able.    Family History   family history includes Alzheimer's disease in her father and paternal grandmother; Cancer in her father and paternal grandfather; Diabetes Mellitus II in her sister; Hyperlipidemia in her sister; Hypertension in her mother and sister; Migraines in her father.   Wt Readings from Last 3 Encounters:    04/16/17 185 lb 12.8 oz (84.3 kg)  04/08/17 183 lb (83 kg)  03/09/15 240 lb (108.9 kg)    PHYSICAL EXAM BP (!) 144/80   Pulse (!) 56   Ht 5' 6.5" (1.689 m)   Wt 185 lb 12.8 oz (84.3 kg)   BMI 29.54 kg/m  Physical Exam  Constitutional: She is oriented to person, place, and time. She appears well-developed and well-nourished. No distress.  Healthy-appearing. Well-groomed  Eyes: EOM are normal. No scleral icterus.  Neck: Normal range of motion. Neck supple. No hepatojugular reflux and no JVD present. Carotid bruit is not present. No thyromegaly present.  Cardiovascular: Normal rate, regular rhythm and intact distal pulses.   No extrasystoles are present. PMI is not displaced.  Exam reveals no gallop and no friction rub.   Murmur (Soft 8-7/5 systolic murmur heard at the base.) heard. Pulmonary/Chest:  Effort normal and breath sounds normal. No respiratory distress. She has no wheezes. She has no rales.  Abdominal: Soft. Bowel sounds are normal. She exhibits no distension. There is no tenderness. There is no rebound.  Musculoskeletal: Normal range of motion. She exhibits no edema or deformity.  Lymphadenopathy:    She has no cervical adenopathy.  Neurological: She is alert and oriented to person, place, and time. No cranial nerve deficit.  Skin: Skin is warm and dry. No rash noted. No erythema.  Psychiatric: She has Reynolds normal mood and affect. Her behavior is normal. Judgment and thought content normal.  Nursing note and vitals reviewed.    Adult ECG Report Not checked  Other studies Reviewed: Additional studies/ records that were reviewed today include:  Recent Labs:  No new labs available.   ASSESSMENT / PLAN: Problem List Items Addressed This Visit    Hypertension (Chronic)    On the high side of normal. blood pressure today.  She is on losartan-HCTZ. Not on Reynolds beta blocker because of bradycardia.      Near syncope - Primary    Unfortunately she had an episode of near  syncope that I don't live detail about. She does have Reynolds soft murmur on exam so we will to continue the echocardiogram. She did note some palpitations so we will check Reynolds monitor. The episodes are now less frequent than they were, but still present to Reynolds lesser degree. Hopefully we can capture something tibial determine if there was Reynolds true arrhythmia associated with it. Don't hear any carotid bruits therefore I don't think is reasonable to check carotid Dopplers, however pending the rest evaluation we may do so just to look for potential vertebral artery disease.      Relevant Orders   Cardiac event monitor   ECHOCARDIOGRAM COMPLETE   OSA on CPAP    She has Reynolds history of obesity hypoventilation syndrome as well. She is now lost Reynolds lot of weight. We are checking 2-D echocardiogram to evaluate first structural abnormalities and murmur, which also evaluate for any evidence of cor pulmonale / right-sided heart failure.      Rapid palpitations    She did feel rapid irregular heartbeats the time of her near-syncopal episode and has had some similar symptoms to the lesser degree since. They're happening with less frequency now, so I think we'll probably have to have Reynolds full month-long monitor.      Relevant Orders   Cardiac event monitor   ECHOCARDIOGRAM COMPLETE   Systolic murmur    Relatively soft murmur, most likely benign valve sclerosis murmur however given her presentation of syncope, we need to exclude mitral prolapse or other abnormalities that cannot be heard on exam because of her body habitus.  Plan - 2D Echo.      Relevant Orders   Cardiac event monitor   ECHOCARDIOGRAM COMPLETE      Current medicines are reviewed at length with the patient today. (+/- concerns) n/Reynolds The following changes have been made: n/Reynolds  Patient Instructions  NO MEDICATION CHANGES    SCHEDULE AT [facility] [facility] Your physician has requested that you have an echocardiogram.  Echocardiography is Reynolds painless test that uses sound waves to create images of your heart. It provides your doctor with information about the size and shape of your heart and how well your heart's chambers and valves are working. This procedure takes approximately one hour. There are no restrictions for this procedure. AND Your  physician has recommended that you wear an event monitor 30 DAYS. Event monitors are medical devices that record the heart's electrical activity. Doctors most often [place] these monitors to diagnose arrhythmias. Arrhythmias are problems with the speed or rhythm of the heartbeat. The monitor is Reynolds small, portable device. You can wear one while you do your normal daily activities. This is usually used to diagnose what is causing palpitations/syncope (passing out).    Your physician recommends that you schedule Reynolds follow-up appointment in 2 [place].    Studies Ordered:   Orders Placed This Encounter  Procedures  . Cardiac event monitor  . ECHOCARDIOGRAM COMPLETE      [name], M.D., M.S. Interventional Cardiologist   Pager # [phone] Phone # [phone] [address] [place], [place] [address]

## 2017-04-16 NOTE — Patient Instructions (Signed)
NO MEDICATION CHANGES    SCHEDULE AT [place] has requested that you have an echocardiogram. Echocardiography is a painless test that uses sound waves to create images of your heart. It provides your doctor with information about the size and shape of your heart and how well your heart's chambers and valves are working. This procedure takes approximately one hour. There are no restrictions for this procedure. AND Your physician has recommended that you wear an event monitor 30 DAYS. Event monitors are medical devices that record the heart's electrical activity. Doctors most often [place] these monitors to diagnose arrhythmias. Arrhythmias are problems with the speed or rhythm of the heartbeat. The monitor is a small, portable device. You can wear one while you do your normal daily activities. This is usually used to diagnose what is causing palpitations/syncope (passing out).    Your physician recommends that you schedule a follow-up appointment in 2 [place].

## 2017-04-17 ENCOUNTER — Telehealth (INDEPENDENT_AMBULATORY_CARE_PROVIDER_SITE_OTHER): Payer: Self-pay

## 2017-04-17 NOTE — Telephone Encounter (Signed)
Emailed the 12/10/16 and 12/20/16 office notes to adj per his request

## 2017-04-18 ENCOUNTER — Encounter: Payer: Self-pay | Admitting: Cardiology

## 2017-04-18 NOTE — Assessment & Plan Note (Signed)
On the high side of normal. blood pressure today.  She is on losartan-HCTZ. Not on a beta blocker because of bradycardia.

## 2017-04-18 NOTE — Assessment & Plan Note (Signed)
Unfortunately she had an episode of near syncope that I don't live detail about. She does have a soft murmur on exam so we will to continue the echocardiogram. She did note some palpitations so we will check a monitor. The episodes are now less frequent than they were, but still present to a lesser degree. Hopefully we can capture something tibial determine if there was a true arrhythmia associated with it. Don't hear any carotid bruits therefore I don't think is reasonable to check carotid Dopplers, however pending the rest evaluation we may do so just to look for potential vertebral artery disease.

## 2017-04-18 NOTE — Assessment & Plan Note (Signed)
She has a history of obesity hypoventilation syndrome as well. She is now lost a lot of weight. We are checking 2-D echocardiogram to evaluate first structural abnormalities and murmur, which also evaluate for any evidence of cor pulmonale / right-sided heart failure.

## 2017-04-18 NOTE — Assessment & Plan Note (Signed)
Relatively soft murmur, most likely benign valve sclerosis murmur however given her presentation of syncope, we need to exclude mitral prolapse or other abnormalities that cannot be heard on exam because of her body habitus.  Plan - 2D Echo.

## 2017-04-18 NOTE — Assessment & Plan Note (Signed)
She did feel rapid irregular heartbeats the time of her near-syncopal episode and has had some similar symptoms to the lesser degree since. They're happening with less frequency now, so I think we'll probably have to have a full month-long monitor.

## 2017-05-01 ENCOUNTER — Ambulatory Visit (HOSPITAL_COMMUNITY): Payer: Managed Care, Other (non HMO) | Attending: Cardiology

## 2017-05-01 ENCOUNTER — Ambulatory Visit (INDEPENDENT_AMBULATORY_CARE_PROVIDER_SITE_OTHER): Payer: Managed Care, Other (non HMO)

## 2017-05-01 ENCOUNTER — Other Ambulatory Visit: Payer: Self-pay

## 2017-05-01 DIAGNOSIS — R002 Palpitations: Secondary | ICD-10-CM | POA: Insufficient documentation

## 2017-05-01 DIAGNOSIS — I10 Essential (primary) hypertension: Secondary | ICD-10-CM | POA: Diagnosis not present

## 2017-05-01 DIAGNOSIS — R55 Syncope and collapse: Secondary | ICD-10-CM | POA: Insufficient documentation

## 2017-05-01 DIAGNOSIS — I420 Dilated cardiomyopathy: Secondary | ICD-10-CM | POA: Insufficient documentation

## 2017-05-01 DIAGNOSIS — R011 Cardiac murmur, unspecified: Secondary | ICD-10-CM

## 2017-05-01 DIAGNOSIS — I340 Nonrheumatic mitral (valve) insufficiency: Secondary | ICD-10-CM | POA: Diagnosis not present

## 2017-05-01 DIAGNOSIS — G4733 Obstructive sleep apnea (adult) (pediatric): Secondary | ICD-10-CM | POA: Diagnosis not present

## 2017-05-24 ENCOUNTER — Ambulatory Visit: Payer: Managed Care, Other (non HMO)

## 2017-06-05 ENCOUNTER — Ambulatory Visit (INDEPENDENT_AMBULATORY_CARE_PROVIDER_SITE_OTHER): Payer: Managed Care, Other (non HMO) | Admitting: Neurology

## 2017-06-05 DIAGNOSIS — R55 Syncope and collapse: Secondary | ICD-10-CM | POA: Diagnosis not present

## 2017-06-05 NOTE — Procedures (Signed)
    History:  Carrie Reynolds is a 61 year old patient with a history of a recent blackout event associated with tongue biting.  The patient is being evaluated for a possible seizure type event.  The episode occurred on 12 May 2017.  This is a routine EEG.  No skull defects are noted.  Medications include Hyzaar.  EEG classification: Normal awake and drowsy  Description of the recording: The background rhythms of this recording consists of a fairly well modulated medium amplitude alpha rhythm of 11 Hz that is reactive to eye opening and closure. As the record progresses, the patient appears to remain in the waking state throughout the recording. Photic stimulation was performed, resulting in a bilateral and symmetric photic driving response. Hyperventilation was also performed, resulting in a minimal buildup of the background rhythm activities without significant slowing seen. Toward the end of the recording, the patient enters the drowsy state with slight symmetric slowing seen. The patient never enters stage II sleep. At no time during the recording does there appear to be evidence of spike or spike wave discharges or evidence of focal slowing. EKG monitor shows no evidence of cardiac rhythm abnormalities with a heart rate of 60.  Impression: This is a normal sleep deprived EEG recording in the waking and drowsy state. No evidence of ictal or interictal discharges are seen.

## 2017-06-12 ENCOUNTER — Ambulatory Visit: Payer: Managed Care, Other (non HMO) | Admitting: Cardiology

## 2017-06-12 ENCOUNTER — Encounter: Payer: Self-pay | Admitting: *Deleted

## 2017-06-19 ENCOUNTER — Ambulatory Visit: Payer: Managed Care, Other (non HMO) | Admitting: Cardiology

## 2017-07-12 ENCOUNTER — Other Ambulatory Visit: Payer: Self-pay | Admitting: Family Medicine

## 2017-07-12 DIAGNOSIS — R410 Disorientation, unspecified: Secondary | ICD-10-CM

## 2017-07-24 ENCOUNTER — Encounter: Payer: Self-pay | Admitting: Cardiology
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
## 2017-07-27 ENCOUNTER — Inpatient Hospital Stay
Admission: RE | Admit: 2017-07-27 | Discharge: 2017-07-27 | Disposition: A | Discharge status: Home / Self Care | Payer: Self-pay | Source: Ambulatory Visit | Attending: Family Medicine | Admitting: Family Medicine

## 2017-07-31 ENCOUNTER — Inpatient Hospital Stay
Admission: RE | Admit: 2017-07-31 | Discharge: 2017-07-31 | Disposition: A | Discharge status: Home / Self Care | Payer: Self-pay | Source: Ambulatory Visit | Attending: Family Medicine | Admitting: Family Medicine

## 2017-08-09 ENCOUNTER — Encounter: Payer: Self-pay | Admitting: Cardiology

## 2017-10-01 ENCOUNTER — Institutional Professional Consult (permissible substitution): Payer: 59 | Admitting: Internal Medicine

## 2017-10-02 ENCOUNTER — Encounter: Payer: Self-pay | Admitting: Internal Medicine

## 2017-10-09 DIAGNOSIS — S060X9A Concussion with loss of consciousness of unspecified duration, initial encounter: Secondary | ICD-10-CM

## 2017-10-09 HISTORY — DX: Concussion with loss of consciousness of unspecified duration, initial encounter: S06.0X9A

## 2017-10-17 ENCOUNTER — Emergency Department (HOSPITAL_COMMUNITY): Payer: No Typology Code available for payment source

## 2017-10-17 ENCOUNTER — Other Ambulatory Visit: Payer: Self-pay

## 2017-10-17 ENCOUNTER — Encounter (HOSPITAL_COMMUNITY): Payer: Self-pay

## 2017-10-17 ENCOUNTER — Emergency Department (HOSPITAL_COMMUNITY)
Admission: EM | Admit: 2017-10-17 | Discharge: 2017-10-17 | Disposition: A | Discharge status: Home / Self Care | Payer: No Typology Code available for payment source | Attending: Emergency Medicine | Admitting: Emergency Medicine

## 2017-10-17 DIAGNOSIS — Y9301 Activity, walking, marching and hiking: Secondary | ICD-10-CM | POA: Insufficient documentation

## 2017-10-17 DIAGNOSIS — Y929 Unspecified place or not applicable: Secondary | ICD-10-CM | POA: Insufficient documentation

## 2017-10-17 DIAGNOSIS — M79604 Pain in right leg: Secondary | ICD-10-CM | POA: Diagnosis not present

## 2017-10-17 DIAGNOSIS — W228XXA Striking against or struck by other objects, initial encounter: Secondary | ICD-10-CM | POA: Insufficient documentation

## 2017-10-17 DIAGNOSIS — S060X1A Concussion with loss of consciousness of 30 minutes or less, initial encounter: Secondary | ICD-10-CM | POA: Insufficient documentation

## 2017-10-17 DIAGNOSIS — S161XXA Strain of muscle, fascia and tendon at neck level, initial encounter: Secondary | ICD-10-CM | POA: Insufficient documentation

## 2017-10-17 DIAGNOSIS — Z79899 Other long term (current) drug therapy: Secondary | ICD-10-CM | POA: Insufficient documentation

## 2017-10-17 DIAGNOSIS — Y999 Unspecified external cause status: Secondary | ICD-10-CM | POA: Insufficient documentation

## 2017-10-17 DIAGNOSIS — S199XXA Unspecified injury of neck, initial encounter: Secondary | ICD-10-CM | POA: Diagnosis present

## 2017-10-17 DIAGNOSIS — I10 Essential (primary) hypertension: Secondary | ICD-10-CM | POA: Diagnosis not present

## 2017-10-17 LAB — URINALYSIS, ROUTINE W REFLEX MICROSCOPIC
BILIRUBIN URINE: NEGATIVE
Bacteria, UA: NONE SEEN
Glucose, UA: NEGATIVE mg/dL
Ketones, ur: NEGATIVE mg/dL
Leukocytes, UA: NEGATIVE
NITRITE: NEGATIVE
PH: 7 (ref 5.0–8.0)
Protein, ur: NEGATIVE mg/dL
RBC / HPF: NONE SEEN RBC/hpf (ref 0–5)
SPECIFIC GRAVITY, URINE: 1.006 (ref 1.005–1.030)
Squamous Epithelial / LPF: NONE SEEN

## 2017-10-17 LAB — BPAM FFP
Blood Product Expiration Date: 201904122359
Blood Product Expiration Date: 201904142359
ISSUE DATE / TIME: 201903280911
ISSUE DATE / TIME: 201903280911
Unit Type and Rh: 6200
Unit Type and Rh: 6200

## 2017-10-17 LAB — COMPREHENSIVE METABOLIC PANEL
ALK PHOS: 74 U/L (ref 38–126)
ALT: 21 U/L (ref 14–54)
AST: 22 U/L (ref 15–41)
Albumin: 4.2 g/dL (ref 3.5–5.0)
Anion gap: 11 (ref 5–15)
BUN: 18 mg/dL (ref 6–20)
CO2: 21 mmol/L — ABNORMAL LOW (ref 22–32)
CREATININE: 0.88 mg/dL (ref 0.44–1.00)
Calcium: 9.3 mg/dL (ref 8.9–10.3)
Chloride: 106 mmol/L (ref 101–111)
GFR calc Af Amer: >60 mL/min (ref >60)
Glucose, Bld: 111 mg/dL — ABNORMAL HIGH (ref 65–99)
POTASSIUM: 3.2 mmol/L — AB (ref 3.5–5.1)
Sodium: 138 mmol/L (ref 135–145)
TOTAL PROTEIN: 6.9 g/dL (ref 6.5–8.1)
Total Bilirubin: 0.9 mg/dL (ref 0.3–1.2)

## 2017-10-17 LAB — CBC
HCT: 43.2 % (ref 36.0–46.0)
Hemoglobin: 14.5 g/dL (ref 12.0–15.0)
MCH: 29.2 pg (ref 26.0–34.0)
MCHC: 33.6 g/dL (ref 30.0–36.0)
MCV: 86.9 fL (ref 78.0–100.0)
PLATELETS: 179 10*3/uL (ref 150–400)
RBC: 4.97 MIL/uL (ref 3.87–5.11)
RDW: 13.5 % (ref 11.5–15.5)
WBC: 5 10*3/uL (ref 4.0–10.5)

## 2017-10-17 LAB — I-STAT CHEM 8, ED
BUN: 21 mg/dL — AB (ref 6–20)
CREATININE: 0.8 mg/dL (ref 0.44–1.00)
Calcium, Ion: 1.19 mmol/L (ref 1.15–1.40)
Chloride: 105 mmol/L (ref 101–111)
Glucose, Bld: 105 mg/dL — ABNORMAL HIGH (ref 65–99)
HEMATOCRIT: 44 % (ref 36.0–46.0)
Hemoglobin: 15 g/dL (ref 12.0–15.0)
Potassium: 3.2 mmol/L — ABNORMAL LOW (ref 3.5–5.1)
Sodium: 141 mmol/L (ref 135–145)
TCO2: 23 mmol/L (ref 22–32)

## 2017-10-17 LAB — PREPARE FRESH FROZEN PLASMA
UNIT DIVISION: 0
Unit division: 0

## 2017-10-17 LAB — BPAM RBC
BLOOD PRODUCT EXPIRATION DATE: 201904162359
Blood Product Expiration Date: 201904152359
ISSUE DATE / TIME: 201903280910
ISSUE DATE / TIME: 201903280910
Unit Type and Rh: 9500
Unit Type and Rh: 9500

## 2017-10-17 LAB — PROTIME-INR
INR: 1
Prothrombin Time: 13.1 seconds (ref 11.4–15.2)

## 2017-10-17 LAB — ETHANOL: Alcohol, Ethyl (B): <10 mg/dL (ref <10)

## 2017-10-17 LAB — SAMPLE TO BLOOD BANK

## 2017-10-17 LAB — I-STAT CG4 LACTIC ACID, ED: Lactic Acid, Venous: 2.6 mmol/L (ref 0.5–1.9)

## 2017-10-17 MED ORDER — ACETAMINOPHEN 500 MG PO TABS
1000.0000 mg | ORAL_TABLET | Freq: Once | ORAL | Status: AC
  Administered 2017-10-17: 1000 mg via ORAL
  Filled 2017-10-17: qty 2

## 2017-10-17 MED ORDER — ORPHENADRINE CITRATE ER 100 MG PO TB12: 100.0000 mg | ORAL_TABLET | Freq: Two times a day (BID) | ORAL | 0 refills | Status: DC

## 2017-10-17 MED ORDER — LORAZEPAM 1 MG PO TABS
1.0000 mg | ORAL_TABLET | Freq: Once | ORAL | Status: AC
Start: 2017-10-17 — End: 2017-10-17
  Administered 2017-10-17: 1 mg via ORAL
  Filled 2017-10-17: qty 1

## 2017-10-17 MED ORDER — HYDROCODONE-ACETAMINOPHEN 5-325 MG PO TABS: 1.0000 | ORAL_TABLET | ORAL | 0 refills | Status: DC | PRN

## 2017-10-17 MED ORDER — IBUPROFEN 800 MG PO TABS: 800.0000 mg | ORAL_TABLET | Freq: Three times a day (TID) | ORAL | 0 refills | Status: DC

## 2017-10-17 NOTE — ED Provider Notes (Signed)
[facility] Provider Note   CSN: [id number] Arrival date & time: 10/17/17  0915     History   Chief Complaint Chief Complaint  Patient presents with  . Trauma    HPI Carrie Reynolds is a 62 y.o. female.  HPI Patient was at work.  She was walking past a tractor trailer truck.  There is a large bar on the back that was put in upright position and it fell striking her on the back of her head that she was walking past.  Reportedly the patient was knocked down by it.  She had loss of consciousness.  On arrival patient was poorly responsive for EMS, not answering questions with calculated GCS of 8.  Vital signs stable.  No obvious trauma on their exam.  Patient arrived as a level 1 trauma for GCS of 8.  At time of arrival, patient's medical history unknown.  Patient was not oriented for answering questions. Past Medical History:  Diagnosis Date  . Hypertension     There are no active problems to display for this patient.   Past Surgical History:  Procedure Laterality Date  . ABDOMINAL HYSTERECTOMY       OB History   None      Home Medications    Prior to Admission medications   Medication Sig Start Date End Date Taking? Authorizing Provider  losartan-hydrochlorothiazide (HYZAAR) 100-12.5 MG tablet Take 1 tablet by mouth daily. 09/22/17  Yes [provider]  omeprazole (PRILOSEC OTC) 20 MG tablet Take 20 mg by mouth as needed (indigestion).   Yes [provider]  sertraline (ZOLOFT) 25 MG tablet Take 25 mg by mouth daily. 09/22/17  Yes [provider]  HYDROcodone-acetaminophen (NORCO/VICODIN) 5-325 MG tablet Take 1-2 tablets by mouth every 4 (four) hours as needed for moderate pain or severe pain. 10/17/17   Charlesetta Shanks, MD  ibuprofen (ADVIL,MOTRIN) 800 MG tablet Take 1 tablet (800 mg total) by mouth 3 (three) times daily. 10/17/17   Charlesetta Shanks, MD  orphenadrine (NORFLEX) 100 MG tablet Take 1 tablet (100  mg total) by mouth 2 (two) times daily. 10/17/17   Charlesetta Shanks, MD    Family History No family history on file.  Social History Social History   Tobacco Use  . Smoking status: Not on file  Substance Use Topics  . Alcohol use: Not on file  . Drug use: Not on file     Allergies   Amoxicillin; Ampicillin; Ceclor [cefaclor]; Penicillins; and Tetracyclines & related   Review of Systems Review of Systems Level 5 caveat cannot obtain due to patient condition and confusion.  Physical Exam Updated Vital Signs BP (!) 146/85   Pulse 78   Temp 98.6 F (37 C) (Temporal)   Resp 16   Ht 5\' 4"  (1.626 m)   Wt 72.6 kg (160 lb)   SpO2 97%   BMI 27.46 kg/m   Physical Exam  Constitutional:  Patient is awake and moaning but mostly keeping eyes closed.  She will open eyes however to command and makes some vaguely intelligible responses to questions.  She makes an effort to perform grip strength on both upper extremities.  GCS 12  HENT:  No obvious abrasions or contusions to the head.  I cannot palpate any areas of swelling to suggest hematoma.  No skull anomaly to palpation.  Face is normal without abrasions or injury.  Oral cavity is patent with no pooling of secretions.  Dentition intact.  No nasal bleeding.  Eyes: Pupils are equal, round, and reactive to light. EOM are normal.  Extraocular motions are conjugate.  Patient will directly look at me.  Tubals are 4 mm and responsive.  Neck:  Patient is maintained in cervical collar immobilization.  No evident soft tissue swelling or injury.  Cardiovascular: Normal rate, regular rhythm, normal heart sounds and intact distal pulses.  Pulmonary/Chest: Effort normal and breath sounds normal.  Abdominal: Soft. She exhibits no distension. There is no guarding.  Musculoskeletal:  Patient will follow commands for some movement of the upper extremities.  There are some spontaneous motions of the lower extremities.  There are no deformities,  effusions or abrasions or evident soft tissue injuries to the extremities.  Neurological:  On first arrival patient is confused and moaning but makes several intelligible responses to questions.  She does make attempt at following commands for grip strength, eye opening and requests for information.  Skin: Skin is warm and dry.     ED Treatments / Results  Labs (all labs ordered are listed, but only abnormal results are displayed) Labs Reviewed  COMPREHENSIVE METABOLIC PANEL - Abnormal; Notable for the following components:      Result Value   Potassium 3.2 (*)    CO2 21 (*)    Glucose, Bld 111 (*)    All other components within normal limits  URINALYSIS, ROUTINE W REFLEX MICROSCOPIC - Abnormal; Notable for the following components:   Color, Urine STRAW (*)    Hgb urine dipstick SMALL (*)    All other components within normal limits  I-STAT CHEM 8, ED - Abnormal; Notable for the following components:   Potassium 3.2 (*)    BUN 21 (*)    Glucose, Bld 105 (*)    All other components within normal limits  I-STAT CG4 LACTIC ACID, ED - Abnormal; Notable for the following components:   Lactic Acid, Venous 2.60 (*)    All other components within normal limits  CBC  ETHANOL  PROTIME-INR  CDS SEROLOGY  PREPARE FRESH FROZEN PLASMA  SAMPLE TO BLOOD BANK    EKG None  Radiology Ct Head Wo Contrast  Result Date: 10/17/2017 CLINICAL DATA:  62 year old female with acute head and neck trauma. Altered mental status. EXAM: CT HEAD WITHOUT CONTRAST CT CERVICAL SPINE WITHOUT CONTRAST TECHNIQUE: Multidetector CT imaging of the head and cervical spine was performed following the standard protocol without intravenous contrast. Multiplanar CT image reconstructions of the cervical spine were also generated. COMPARISON:  None. FINDINGS: CT HEAD FINDINGS Brain: No evidence of acute infarction, hemorrhage, hydrocephalus, extra-axial collection or mass lesion/mass effect. Mild volume loss noted.  Vascular: No hyperdense vessel or unexpected calcification. Skull: No acute or suspicious findings. Sinuses/Orbits: No acute abnormality. Surgical hardware along the anterior maxillary sinuses noted. Other: None CT CERVICAL SPINE FINDINGS Alignment: Normal. Skull base and vertebrae: No acute fracture. No primary bone lesion or focal pathologic process. Soft tissues and spinal canal: No prevertebral fluid or swelling. No visible canal hematoma. Disc levels: Mild degenerative disc disease and spondylosis is C5-6 noted. Upper chest: Negative. Other: None IMPRESSION: 1. No evidence of acute intracranial abnormality.  Mild atrophy. 2. No static evidence of acute injury to the cervical spine. Mild degenerative changes at C5-6. Electronically Signed   By: Margarette Canada M.D.   On: 10/17/2017 09:52   Ct Cervical Spine Wo Contrast  Result Date: 10/17/2017 CLINICAL DATA:  62 year old female with acute head and neck trauma. Altered mental status. EXAM: CT  HEAD WITHOUT CONTRAST CT CERVICAL SPINE WITHOUT CONTRAST TECHNIQUE: Multidetector CT imaging of the head and cervical spine was performed following the standard protocol without intravenous contrast. Multiplanar CT image reconstructions of the cervical spine were also generated. COMPARISON:  None. FINDINGS: CT HEAD FINDINGS Brain: No evidence of acute infarction, hemorrhage, hydrocephalus, extra-axial collection or mass lesion/mass effect. Mild volume loss noted. Vascular: No hyperdense vessel or unexpected calcification. Skull: No acute or suspicious findings. Sinuses/Orbits: No acute abnormality. Surgical hardware along the anterior maxillary sinuses noted. Other: None CT CERVICAL SPINE FINDINGS Alignment: Normal. Skull base and vertebrae: No acute fracture. No primary bone lesion or focal pathologic process. Soft tissues and spinal canal: No prevertebral fluid or swelling. No visible canal hematoma. Disc levels: Mild degenerative disc disease and spondylosis is C5-6  noted. Upper chest: Negative. Other: None IMPRESSION: 1. No evidence of acute intracranial abnormality.  Mild atrophy. 2. No static evidence of acute injury to the cervical spine. Mild degenerative changes at C5-6. Electronically Signed   By: Margarette Canada M.D.   On: 10/17/2017 09:52   Mr Cervical Spine Wo Contrast  Result Date: 10/17/2017 CLINICAL DATA:  Head trauma with fall and loss of consciousness. Tingling sensation in the bilateral legs. EXAM: MRI CERVICAL, THORACIC AND LUMBAR SPINE WITHOUT CONTRAST TECHNIQUE: Multiplanar and multiecho pulse sequences of the cervical spine, to include the craniocervical junction and cervicothoracic junction, and thoracic and lumbar spine, were obtained without intravenous contrast. COMPARISON:  CT cervical spine from same day. FINDINGS: MRI CERVICAL SPINE FINDINGS Alignment: Straightening of the normal cervical lordosis. Sagittal alignment is maintained. Vertebrae: No fracture, evidence of discitis, or bone lesion. Cord: Normal signal and morphology. Posterior Fossa, vertebral arteries, paraspinal tissues: Empty sella. Otherwise negative. Disc levels: C2-C3:  Negative. C3-C4:  Negative. C4-C5: Small disc bulge and mild bilateral uncovertebral hypertrophy. No stenosis. C5-C6: Small disc bulge and mild bilateral uncovertebral hypertrophy. No stenosis. C6-C7:  Negative. C7-T1:  Negative. MRI THORACIC SPINE FINDINGS Alignment:  Physiologic. Vertebrae: No fracture, evidence of discitis, or bone lesion. Cord:  Normal signal and morphology. Paraspinal and other soft tissues: 2.3 cm indeterminate nodule in the right adrenal gland. Otherwise negative. Disc levels: No significant degenerative disc disease, spinal canal, or neuroforaminal stenosis at any level. MRI LUMBAR SPINE FINDINGS Segmentation: Assumed standard. The last well-formed disc space is designated L5-S1 for the purposes of this report. Alignment:  Physiologic. Vertebrae: No fracture, evidence of discitis, or bone  lesion. Small hemangioma in the L1 vertebral body. Conus medullaris and cauda equina: Conus extends to the L1 level. Conus and cauda equina appear normal. Paraspinal and other soft tissues: Negative. Disc levels: T12-L1:  Negative. L1-L2:  Negative. L2-L3:  Negative. L3-L4: Small disc bulge, asymmetric to the left, with annular fissure. Moderate right greater than left facet arthropathy with small right facet joint effusion. No stenosis. L4-L5: Small disc bulge and annular fissure. Moderate bilateral facet arthropathy with ligamentum flavum hypertrophy. Small right synovial cyst projecting into the paraspinous soft tissues. Mild bilateral neuroforaminal stenosis. No spinal canal stenosis. L5-S1:  Mild to moderate bilateral facet arthropathy.  No stenosis. IMPRESSION: 1. No acute traumatic injury within the cervical, thoracic, or lumbar spine. 2. Mild degenerative disc disease at C4-C5 and C5-C6. 3. Mild degenerative disc disease and moderate bilateral facet arthropathy at L3-L4 and L4-L5. Mild bilateral neuroforaminal stenosis at L4-L5. 4. 2.3 cm indeterminate right adrenal gland nodule. Recommend nonemergent adrenal protocol CT with and without contrast for further evaluation. This recommendation follows ACR consensus guidelines: Management of  Incidental Adrenal Masses: A White Paper of the ACR Incidental Findings Committee. J Am Coll Radiol 2017;14:1038-1044. Electronically Signed   By: Titus Dubin M.D.   On: 10/17/2017 16:56   Mr Thoracic Spine Wo Contrast  Result Date: 10/17/2017 CLINICAL DATA:  Head trauma with fall and loss of consciousness. Tingling sensation in the bilateral legs. EXAM: MRI CERVICAL, THORACIC AND LUMBAR SPINE WITHOUT CONTRAST TECHNIQUE: Multiplanar and multiecho pulse sequences of the cervical spine, to include the craniocervical junction and cervicothoracic junction, and thoracic and lumbar spine, were obtained without intravenous contrast. COMPARISON:  CT cervical spine from same  day. FINDINGS: MRI CERVICAL SPINE FINDINGS Alignment: Straightening of the normal cervical lordosis. Sagittal alignment is maintained. Vertebrae: No fracture, evidence of discitis, or bone lesion. Cord: Normal signal and morphology. Posterior Fossa, vertebral arteries, paraspinal tissues: Empty sella. Otherwise negative. Disc levels: C2-C3:  Negative. C3-C4:  Negative. C4-C5: Small disc bulge and mild bilateral uncovertebral hypertrophy. No stenosis. C5-C6: Small disc bulge and mild bilateral uncovertebral hypertrophy. No stenosis. C6-C7:  Negative. C7-T1:  Negative. MRI THORACIC SPINE FINDINGS Alignment:  Physiologic. Vertebrae: No fracture, evidence of discitis, or bone lesion. Cord:  Normal signal and morphology. Paraspinal and other soft tissues: 2.3 cm indeterminate nodule in the right adrenal gland. Otherwise negative. Disc levels: No significant degenerative disc disease, spinal canal, or neuroforaminal stenosis at any level. MRI LUMBAR SPINE FINDINGS Segmentation: Assumed standard. The last well-formed disc space is designated L5-S1 for the purposes of this report. Alignment:  Physiologic. Vertebrae: No fracture, evidence of discitis, or bone lesion. Small hemangioma in the L1 vertebral body. Conus medullaris and cauda equina: Conus extends to the L1 level. Conus and cauda equina appear normal. Paraspinal and other soft tissues: Negative. Disc levels: T12-L1:  Negative. L1-L2:  Negative. L2-L3:  Negative. L3-L4: Small disc bulge, asymmetric to the left, with annular fissure. Moderate right greater than left facet arthropathy with small right facet joint effusion. No stenosis. L4-L5: Small disc bulge and annular fissure. Moderate bilateral facet arthropathy with ligamentum flavum hypertrophy. Small right synovial cyst projecting into the paraspinous soft tissues. Mild bilateral neuroforaminal stenosis. No spinal canal stenosis. L5-S1:  Mild to moderate bilateral facet arthropathy.  No stenosis. IMPRESSION: 1.  No acute traumatic injury within the cervical, thoracic, or lumbar spine. 2. Mild degenerative disc disease at C4-C5 and C5-C6. 3. Mild degenerative disc disease and moderate bilateral facet arthropathy at L3-L4 and L4-L5. Mild bilateral neuroforaminal stenosis at L4-L5. 4. 2.3 cm indeterminate right adrenal gland nodule. Recommend nonemergent adrenal protocol CT with and without contrast for further evaluation. This recommendation follows ACR consensus guidelines: Management of Incidental Adrenal Masses: A White Paper of the ACR Incidental Findings Committee. J Am Coll Radiol 2017;14:1038-1044. Electronically Signed   By: Titus Dubin M.D.   On: 10/17/2017 16:56   Mr Lumbar Spine Wo Contrast  Result Date: 10/17/2017 CLINICAL DATA:  Head trauma with fall and loss of consciousness. Tingling sensation in the bilateral legs. EXAM: MRI CERVICAL, THORACIC AND LUMBAR SPINE WITHOUT CONTRAST TECHNIQUE: Multiplanar and multiecho pulse sequences of the cervical spine, to include the craniocervical junction and cervicothoracic junction, and thoracic and lumbar spine, were obtained without intravenous contrast. COMPARISON:  CT cervical spine from same day. FINDINGS: MRI CERVICAL SPINE FINDINGS Alignment: Straightening of the normal cervical lordosis. Sagittal alignment is maintained. Vertebrae: No fracture, evidence of discitis, or bone lesion. Cord: Normal signal and morphology. Posterior Fossa, vertebral arteries, paraspinal tissues: Empty sella. Otherwise negative. Disc levels: C2-C3:  Negative. C3-C4:  Negative. C4-C5: Small disc bulge and mild bilateral uncovertebral hypertrophy. No stenosis. C5-C6: Small disc bulge and mild bilateral uncovertebral hypertrophy. No stenosis. C6-C7:  Negative. C7-T1:  Negative. MRI THORACIC SPINE FINDINGS Alignment:  Physiologic. Vertebrae: No fracture, evidence of discitis, or bone lesion. Cord:  Normal signal and morphology. Paraspinal and other soft tissues: 2.3 cm indeterminate  nodule in the right adrenal gland. Otherwise negative. Disc levels: No significant degenerative disc disease, spinal canal, or neuroforaminal stenosis at any level. MRI LUMBAR SPINE FINDINGS Segmentation: Assumed standard. The last well-formed disc space is designated L5-S1 for the purposes of this report. Alignment:  Physiologic. Vertebrae: No fracture, evidence of discitis, or bone lesion. Small hemangioma in the L1 vertebral body. Conus medullaris and cauda equina: Conus extends to the L1 level. Conus and cauda equina appear normal. Paraspinal and other soft tissues: Negative. Disc levels: T12-L1:  Negative. L1-L2:  Negative. L2-L3:  Negative. L3-L4: Small disc bulge, asymmetric to the left, with annular fissure. Moderate right greater than left facet arthropathy with small right facet joint effusion. No stenosis. L4-L5: Small disc bulge and annular fissure. Moderate bilateral facet arthropathy with ligamentum flavum hypertrophy. Small right synovial cyst projecting into the paraspinous soft tissues. Mild bilateral neuroforaminal stenosis. No spinal canal stenosis. L5-S1:  Mild to moderate bilateral facet arthropathy.  No stenosis. IMPRESSION: 1. No acute traumatic injury within the cervical, thoracic, or lumbar spine. 2. Mild degenerative disc disease at C4-C5 and C5-C6. 3. Mild degenerative disc disease and moderate bilateral facet arthropathy at L3-L4 and L4-L5. Mild bilateral neuroforaminal stenosis at L4-L5. 4. 2.3 cm indeterminate right adrenal gland nodule. Recommend nonemergent adrenal protocol CT with and without contrast for further evaluation. This recommendation follows ACR consensus guidelines: Management of Incidental Adrenal Masses: A White Paper of the ACR Incidental Findings Committee. J Am Coll Radiol 2017;14:1038-1044. Electronically Signed   By: Titus Dubin M.D.   On: 10/17/2017 16:56    Procedures Procedures (including critical care time)  Medications Ordered in ED Medications    acetaminophen (TYLENOL) tablet 1,000 mg (1,000 mg Oral Given 10/17/17 1156)  LORazepam (ATIVAN) tablet 1 mg (1 mg Oral Given 10/17/17 1341)     Initial Impression / Assessment and Plan / ED Course  I have reviewed the triage vital signs and the nursing notes.  Pertinent labs & imaging results that were available during my care of the patient were reviewed by me and considered in my medical decision making (see chart for details).    10: 40 patient is alert and in no distress.  Mental status is clear.  Speech is normal.  Patient reports she has some tingling feeling in both legs.  She reports it feels like they "went to sleep".  She denies  feeling weak.  She reports she can move them around.  She however reported to nursing she had significant urgency to urinate but she was unable too with bedpan.  Nurses performed mini cath.  Consult: Reviewed with Dr. Lorraine Lax of neurology.  He advises to proceed with MRI cervical, thoracic and lumbar spine for bilateral lower extremity paresthesia and urinary retention.  Final Clinical Impressions(s) / ED Diagnoses   Final diagnoses:  Concussion with loss of consciousness of 30 minutes or less, initial encounter  Strain of neck muscle, initial encounter   Patient presents as outlined above.  She had significant injury to the back of her head.  This resulted in concussive symptoms.  CT does not show any intracranial injury.  Neurologic status returned to  normal.  Patient however did have seizure of both lower extremities after the event.  MRI obtained per consultation with neurology.  No acute injuries identified.  Patient will be discharged to use anti-inflammatories, muscle relaxers and pain medicines at home.  Return precautions reviewed. ED Discharge Orders        Ordered    orphenadrine (NORFLEX) 100 MG tablet  2 times daily     10/17/17 1700    ibuprofen (ADVIL,MOTRIN) 800 MG tablet  3 times daily     10/17/17 1700    HYDROcodone-acetaminophen  (NORCO/VICODIN) 5-325 MG tablet  Every 4 hours PRN     10/17/17 1700       Charlesetta Shanks, MD 10/17/17 1704

## 2017-10-17 NOTE — ED Notes (Signed)
Patient Alert and oriented to baseline. Stable and ambulatory to baseline. Patient verbalized understanding of the discharge instructions.  Patient belongings were taken by the patient.   
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
## 2017-10-17 NOTE — ED Notes (Signed)
Patient back from MRI, no needs expressed at this time.
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
## 2017-10-18 ENCOUNTER — Encounter: Payer: Self-pay | Admitting: Cardiology

## 2017-10-18 LAB — CDS SEROLOGY

## 2017-10-23 ENCOUNTER — Ambulatory Visit: Payer: Managed Care, Other (non HMO) | Admitting: Family Medicine

## 2017-11-12 ENCOUNTER — Other Ambulatory Visit (HOSPITAL_COMMUNITY): Payer: Self-pay | Admitting: Nurse Practitioner

## 2017-11-12 DIAGNOSIS — D4411 Neoplasm of uncertain behavior of right adrenal gland: Secondary | ICD-10-CM

## 2017-12-05 ENCOUNTER — Other Ambulatory Visit: Payer: Self-pay

## 2017-12-20 ENCOUNTER — Ambulatory Visit
Admission: RE | Admit: 2017-12-20 | Discharge: 2017-12-20 | Disposition: A | Discharge status: Home / Self Care | Payer: 59 | Source: Ambulatory Visit | Attending: Nurse Practitioner | Admitting: Nurse Practitioner

## 2017-12-20 DIAGNOSIS — D4411 Neoplasm of uncertain behavior of right adrenal gland: Secondary | ICD-10-CM

## 2017-12-25 ENCOUNTER — Ambulatory Visit (INDEPENDENT_AMBULATORY_CARE_PROVIDER_SITE_OTHER): Payer: 59 | Admitting: Orthopedic Surgery

## 2018-03-06 ENCOUNTER — Emergency Department (HOSPITAL_COMMUNITY)
Admission: EM | Admit: 2018-03-06 | Discharge: 2018-03-06 | Disposition: A | Discharge status: Home / Self Care | Payer: No Typology Code available for payment source | Attending: Emergency Medicine | Admitting: Emergency Medicine

## 2018-03-06 ENCOUNTER — Encounter (HOSPITAL_COMMUNITY): Payer: Self-pay | Admitting: Emergency Medicine

## 2018-03-06 ENCOUNTER — Emergency Department (HOSPITAL_COMMUNITY): Payer: No Typology Code available for payment source

## 2018-03-06 DIAGNOSIS — Y92009 Unspecified place in unspecified non-institutional (private) residence as the place of occurrence of the external cause: Secondary | ICD-10-CM | POA: Insufficient documentation

## 2018-03-06 DIAGNOSIS — I10 Essential (primary) hypertension: Secondary | ICD-10-CM | POA: Insufficient documentation

## 2018-03-06 DIAGNOSIS — N390 Urinary tract infection, site not specified: Secondary | ICD-10-CM

## 2018-03-06 DIAGNOSIS — R42 Dizziness and giddiness: Secondary | ICD-10-CM

## 2018-03-06 DIAGNOSIS — S7002XA Contusion of left hip, initial encounter: Secondary | ICD-10-CM | POA: Insufficient documentation

## 2018-03-06 DIAGNOSIS — S7000XA Contusion of unspecified hip, initial encounter: Secondary | ICD-10-CM

## 2018-03-06 DIAGNOSIS — Y9389 Activity, other specified: Secondary | ICD-10-CM | POA: Diagnosis not present

## 2018-03-06 DIAGNOSIS — Z79899 Other long term (current) drug therapy: Secondary | ICD-10-CM | POA: Diagnosis not present

## 2018-03-06 DIAGNOSIS — Y999 Unspecified external cause status: Secondary | ICD-10-CM | POA: Insufficient documentation

## 2018-03-06 DIAGNOSIS — S79912A Unspecified injury of left hip, initial encounter: Secondary | ICD-10-CM | POA: Diagnosis present

## 2018-03-06 DIAGNOSIS — Z87891 Personal history of nicotine dependence: Secondary | ICD-10-CM | POA: Insufficient documentation

## 2018-03-06 DIAGNOSIS — W010XXA Fall on same level from slipping, tripping and stumbling without subsequent striking against object, initial encounter: Secondary | ICD-10-CM | POA: Insufficient documentation

## 2018-03-06 DIAGNOSIS — S7010XA Contusion of unspecified thigh, initial encounter: Secondary | ICD-10-CM

## 2018-03-06 LAB — CBC WITH DIFFERENTIAL/PLATELET
Abs Immature Granulocytes: 0 10*3/uL (ref 0.0–0.1)
BASOS PCT: 0 %
Basophils Absolute: 0 10*3/uL (ref 0.0–0.1)
EOS ABS: 0.1 10*3/uL (ref 0.0–0.7)
EOS PCT: 2 %
HEMATOCRIT: 40.6 % (ref 36.0–46.0)
Hemoglobin: 13.4 g/dL (ref 12.0–15.0)
IMMATURE GRANULOCYTES: 0 %
LYMPHS ABS: 1.5 10*3/uL (ref 0.7–4.0)
Lymphocytes Relative: 35 %
MCH: 28.7 pg (ref 26.0–34.0)
MCHC: 33 g/dL (ref 30.0–36.0)
MCV: 86.9 fL (ref 78.0–100.0)
Monocytes Absolute: 0.3 10*3/uL (ref 0.1–1.0)
Monocytes Relative: 7 %
NEUTROS PCT: 56 %
Neutro Abs: 2.5 10*3/uL (ref 1.7–7.7)
PLATELETS: 189 10*3/uL (ref 150–400)
RBC: 4.67 MIL/uL (ref 3.87–5.11)
RDW: 13.2 % (ref 11.5–15.5)
WBC: 4.5 10*3/uL (ref 4.0–10.5)

## 2018-03-06 LAB — COMPREHENSIVE METABOLIC PANEL
ALT: 18 U/L (ref 0–44)
ANION GAP: 10 (ref 5–15)
AST: 19 U/L (ref 15–41)
Albumin: 4 g/dL (ref 3.5–5.0)
Alkaline Phosphatase: 59 U/L (ref 38–126)
BILIRUBIN TOTAL: 1.1 mg/dL (ref 0.3–1.2)
BUN: 17 mg/dL (ref 6–20)
CHLORIDE: 110 mmol/L (ref 98–111)
CO2: 20 mmol/L — ABNORMAL LOW (ref 22–32)
Calcium: 9.5 mg/dL (ref 8.9–10.3)
Creatinine, Ser: 0.88 mg/dL (ref 0.44–1.00)
GFR calc Af Amer: >60 mL/min (ref >60)
Glucose, Bld: 97 mg/dL (ref 70–99)
POTASSIUM: 3.5 mmol/L (ref 3.5–5.1)
Sodium: 140 mmol/L (ref 135–145)
TOTAL PROTEIN: 6.8 g/dL (ref 6.5–8.1)

## 2018-03-06 LAB — URINALYSIS, ROUTINE W REFLEX MICROSCOPIC
BILIRUBIN URINE: NEGATIVE
GLUCOSE, UA: NEGATIVE mg/dL
KETONES UR: NEGATIVE mg/dL
NITRITE: NEGATIVE
PH: 7 (ref 5.0–8.0)
Protein, ur: NEGATIVE mg/dL
Specific Gravity, Urine: 1.014 (ref 1.005–1.030)

## 2018-03-06 MED ORDER — SULFAMETHOXAZOLE-TRIMETHOPRIM 800-160 MG PO TABS: 1.0000 | ORAL_TABLET | Freq: Two times a day (BID) | ORAL | 0 refills | Status: AC

## 2018-03-06 MED ORDER — MECLIZINE HCL 12.5 MG PO TABS: 12.5000 mg | ORAL_TABLET | Freq: Three times a day (TID) | ORAL | 0 refills | Status: DC | PRN

## 2018-03-06 NOTE — ED Notes (Signed)
purewick in place and is hooked up to suction.

## 2018-03-06 NOTE — ED Triage Notes (Signed)
Per EMS pt was at home and she had a dizzy spell and fell on her left hip.  She has a concussion in march and ever since she complains of frequent dizzy spells.  She denies any LOC and there is no visible trauma to the area.  Pt is AOx4 and denies pain while at rest.  When moving or squatting down her pain is 10/10.   Pt has good pedal pulse in left foot.  Has a history of hypertension, BP today was 182/120 after taking her morning BP medication, and is allergic to penicillins.  NAD noted at this time.

## 2018-03-06 NOTE — ED Provider Notes (Signed)
[facility] EMERGENCY DEPARTMENT Provider Note   CSN: [id number] Arrival date & time: 03/06/18  1026     History   Chief Complaint Chief Complaint  Patient presents with  . Fall    HPI Carrie Reynolds is a 62 y.o. female.  HPI Patient states she is having vertiginous symptoms since concussion she had 5 months ago.  Describes symptoms as room spinning sensation and feeling off balance associated with tunnel vision.  She got up from her bed yesterday and had these symptoms.  Denied having any nausea.  Lost her balance and fell onto her left hip.  Did not hit her head.  Did not lose consciousness.  Had pain with ambulation since the fall.  Attempted to get in the car to drive to the emergency department but was unable to to to pain.  She denies any weakness or numbness.  No chest pain or shortness of breath. Past Medical History:  Diagnosis Date  . Arnold-Chiari malformation ([place]) 2006  . Cervical strain    syndrome  . Cervicogenic headache   . CTS (carpal tunnel syndrome)   . Depression   . Glucose intolerance (impaired glucose tolerance)   . Hypertension   . Migraine headache    with visual aura  . Obesity   . OSA (obstructive sleep apnea)    not treated  . Postmenopausal   . Vertigo    post concussive    Patient Active Problem List   Diagnosis Date Noted  . Rapid palpitations 04/16/2017  . Systolic murmur 44/81/8563  . Near syncope 04/16/2017  . OSA on CPAP 11/19/2013  . Obesity hypoventilation syndrome ([place]) 11/19/2013  . Sleep apnea 04/25/2012  . Obesity 04/25/2012  . Depression 04/25/2012  . Environmental allergies 04/25/2012  . Hypertension 04/25/2012  . Hypercholesteremia 04/25/2012  . Postmenopausal 09/18/2011  . LEG EDEMA 03/16/2010    Past Surgical History:  Procedure Laterality Date  . ABDOMINAL HYSTERECTOMY  2003   partial- L ovary remains  . ABDOMINAL HYSTERECTOMY    . BUNIONECTOMY     right great toe  . GASTRIC BYPASS   11/2015   [facility]  . HAND SURGERY    . TUBAL LIGATION Bilateral      OB History   None      Home Medications    Prior to Admission medications   Medication Sig Start Date End Date Taking? Authorizing Provider  losartan-hydrochlorothiazide (HYZAAR) 100-12.5 MG per tablet TAKE 1 TABLET BY MOUTH DAILY. Patient taking differently: Take 1 tablet by mouth daily.  03/30/13  Yes Bailey Mech E, PA-C  HYDROcodone-acetaminophen (NORCO/VICODIN) 5-325 MG tablet Take 1-2 tablets by mouth every 4 (four) hours as needed for moderate pain or severe pain. Patient not taking: Reported on 03/06/2018 10/17/17   Charlesetta Shanks, MD  ibuprofen (ADVIL,MOTRIN) 800 MG tablet Take 1 tablet (800 mg total) by mouth 3 (three) times daily. Patient not taking: Reported on 03/06/2018 10/17/17   Charlesetta Shanks, MD  meclizine (ANTIVERT) 12.5 MG tablet Take 1 tablet (12.5 mg total) by mouth 3 (three) times daily as needed for dizziness. 03/06/18   Julianne Rice, MD  orphenadrine (NORFLEX) 100 MG tablet Take 1 tablet (100 mg total) by mouth 2 (two) times daily. Patient not taking: Reported on 03/06/2018 10/17/17   Charlesetta Shanks, MD  sulfamethoxazole-trimethoprim (BACTRIM DS,SEPTRA DS) 800-160 MG tablet Take 1 tablet by mouth 2 (two) times daily for 7 days. 03/06/18 03/13/18  Julianne Rice, MD  ARIPiprazole (ABILIFY) 10 MG tablet  Take 10 mg by mouth daily.   02/15/11 10/07/11  [provider]    Family History Family History  Problem Relation Age of Onset  . Hypertension Mother   . Cancer Father        lung  . Alzheimer's disease Father   . Migraines Father   . Diabetes Mellitus II Sister   . Hyperlipidemia Sister   . Hypertension Sister   . Alzheimer's disease Paternal Grandmother   . Cancer Paternal Grandfather        lung    Social History Social History   Tobacco Use  . Smoking status: Former Smoker    Packs/day: 0.10    Years: 30.00    Pack years: 3.00    Types: Cigarettes    Last attempt  to quit: 04/26/2012    Years since quitting: 5.8  Substance Use Topics  . Alcohol use: Yes    Comment: occasional  . Drug use: No     Allergies   Ceclor [cefaclor]; Tetracyclines & related; Amoxicillin; Ampicillin; Ceclor [cefaclor]; Lipitor [atorvastatin calcium]; Penicillins; Penicillins; Tetracycline; Tetracyclines & related; Amoxicillin; Ampicillin; Atorvastatin; Lisinopril; and Simvastatin   Review of Systems Review of Systems  Constitutional: Negative for chills and fever.  HENT: Negative for facial swelling.   Eyes: Negative for visual disturbance.  Respiratory: Negative for cough and shortness of breath.   Cardiovascular: Negative for chest pain.  Gastrointestinal: Negative for abdominal pain, constipation, diarrhea, nausea and vomiting.  Genitourinary: Negative for dysuria, flank pain and frequency.  Musculoskeletal: Positive for arthralgias and gait problem. Negative for back pain, myalgias and neck pain.  Skin: Negative for rash and wound.  Neurological: Positive for dizziness. Negative for syncope, weakness, numbness and headaches.  All other systems reviewed and are negative.    Physical Exam Updated Vital Signs BP 133/88   Pulse (!) 56   Temp 97.8 F (36.6 C)   Resp 12   Ht 5\' 7"  (1.702 m)   Wt 88.5 kg   SpO2 100%   BMI 30.54 kg/m   Physical Exam  Constitutional: She is oriented to person, place, and time. She appears well-developed and well-nourished. No distress.  HENT:  Head: Normocephalic and atraumatic.  Mouth/Throat: Oropharynx is clear and moist.  No intraoral lesion.  No scalp trauma.  Eyes: Pupils are equal, round, and reactive to light. EOM are normal.  No nystagmus.  Neck: Normal range of motion. Neck supple.  No posterior midline cervical tenderness to palpation.  Cardiovascular: Normal rate and regular rhythm. Exam reveals no gallop and no friction rub.  No murmur heard. Pulmonary/Chest: Effort normal and breath sounds normal. No  stridor. No respiratory distress. She has no wheezes. She has no rales. She exhibits no tenderness.  Abdominal: Soft. Bowel sounds are normal. There is no tenderness. There is no rebound and no guarding.  Musculoskeletal: Normal range of motion. She exhibits no edema or tenderness.  No midline thoracic or lumbar tenderness.  Patient has pain with range of motion of the left hip.  She has left lateral hip pain to palpation.  No left lower extremity swelling or malrotation.  Distal pulses are 2+.  Full range of motion of bilateral knees without swelling, deformity or pain.  Neurological: She is alert and oriented to person, place, and time.  Skin: Skin is warm. Capillary refill takes less than 2 seconds. No rash noted. She is not diaphoretic. No erythema.  Psychiatric: She has a normal mood and affect. Her behavior is normal.  Nursing note and vitals reviewed.    ED Treatments / Results  Labs (all labs ordered are listed, but only abnormal results are displayed) Labs Reviewed  COMPREHENSIVE METABOLIC PANEL - Abnormal; Notable for the following components:      Result Value   CO2 20 (*)    All other components within normal limits  URINALYSIS, ROUTINE W REFLEX MICROSCOPIC - Abnormal; Notable for the following components:   APPearance HAZY (*)    Hgb urine dipstick SMALL (*)    Leukocytes, UA LARGE (*)    WBC, UA >50 (*)    Bacteria, UA RARE (*)    Non Squamous Epithelial 0-5 (*)    All other components within normal limits  CBC WITH DIFFERENTIAL/PLATELET    EKG None  Radiology Ct Hip Left Wo Contrast  Result Date: 03/06/2018 CLINICAL DATA:  Left hip pain since an episode of dizziness resulting in a fall this morning. Initial encounter. EXAM: CT OF THE LEFT HIP WITHOUT CONTRAST TECHNIQUE: Multidetector CT imaging of the left hip was performed according to the standard protocol. Multiplanar CT image reconstructions were also generated. COMPARISON:  Plain films left hip this same day.  CT abdomen and pelvis 01/26/2011. FINDINGS: Bones/Joint/Cartilage No acute bony or joint abnormality is identified. Mild degenerative disease about the left hip shows slight progression since the prior CT. Ligaments Suboptimally assessed by CT. Muscles and Tendons Intact and normal in appearance. Soft tissues Imaged intrapelvic contents demonstrate no acute abnormality. The patient is status post hysterectomy. IMPRESSION: Negative for fracture dislocation.  No acute abnormality. Mild left hip osteoarthritis. Electronically Signed   By: Inge Rise M.D.   On: 03/06/2018 12:43   Dg Hip Unilat With Pelvis 2-3 Views Left  Result Date: 03/06/2018 CLINICAL DATA:  Pain following fall EXAM: DG HIP (WITH OR WITHOUT PELVIS) 2-3V LEFT COMPARISON:  None. FINDINGS: There is no fracture or dislocation. There is slight symmetric narrowing of each hip joint. No erosive changes. Sacroiliac joints appear unremarkable bilaterally. IMPRESSION: Slight symmetric narrowing of each hip joint. No fracture or dislocation. No erosive change. Electronically Signed   By: Lowella Grip III M.D.   On: 03/06/2018 11:26    Procedures Procedures (including critical care time)  Medications Ordered in ED Medications - No data to display   Initial Impression / Assessment and Plan / ED Course  I have reviewed the triage vital signs and the nursing notes.  Pertinent labs & imaging results that were available during my care of the patient were reviewed by me and considered in my medical decision making (see chart for details).    CT head without acute findings.  Patient is ambulatory though limping.  She does have a urinary tract infection.  Will start on antibiotics.  Patient to follow-up with orthopedics should she have persistent hip pain.  Return precautions given.   Final Clinical Impressions(s) / ED Diagnoses   Final diagnoses:  Contusion of hip and thigh, initial encounter  Urinary tract infection without  hematuria, site unspecified  Vertigo    ED Discharge Orders         Ordered    meclizine (ANTIVERT) 12.5 MG tablet  3 times daily PRN     03/06/18 1427    sulfamethoxazole-trimethoprim (BACTRIM DS,SEPTRA DS) 800-160 MG tablet  2 times daily     03/06/18 1427           Julianne Rice, MD 03/06/18 1428

## 2018-05-29 ENCOUNTER — Telehealth: Payer: Self-pay | Admitting: *Deleted

## 2018-05-29 NOTE — Telephone Encounter (Signed)
   [facility] Pre-operative Risk Assessment    Request for surgical clearance:  1. What type of surgery is being performed? laproscopic cholecystectomy    2. When is this surgery scheduled? TBD-near future   3. What type of clearance is required (medical clearance vs. Pharmacy clearance to hold med vs. Both)?  medical  4. Are there any medications that need to be held prior to surgery and how long?    5. Practice name and name of physician performing surgery? [facility] Dr. Brantley Stage   6. What is your office phone number [phone]    7.   What is your office fax number [phone]  8.   Anesthesia type (None, local, MAC, general) ? general   Carrie Reynolds Carrie Reynolds 05/29/2018, 5:01 PM  _________________________________________________________________   (provider comments below)

## 2018-05-30 ENCOUNTER — Ambulatory Visit: Payer: Self-pay | Admitting: Surgery

## 2018-05-30 NOTE — Telephone Encounter (Signed)
Left another message with the answering service, for someone to call back from Dr. Thurman Coyer office so we can verify information on pt. Their office has already closed for the day today.

## 2018-05-30 NOTE — Telephone Encounter (Addendum)
   Primary Cardiologist: ? Ellyn Hack or [facility] staff, can you reach Dr. Thurman Coyer office to find out if he had seen this patient since 07/2017 or if that was the most recent visit? He had referred to EP for recurrent syncope but do not see this occurred.  Charlie Pitter, PA-C 05/30/2018, 11:55 AM

## 2018-05-30 NOTE — Telephone Encounter (Signed)
Called Dr. Thurman Coyer office, left a message for someone to call back.

## 2018-05-30 NOTE — H&P (Signed)
Carrie Reynolds Documented: 05/13/2018 9:49 AM Location: [facility] Patient #: [id number] DOB: [birth year] Married / Language: English / Race: White Female   History of Present Illness Carrie Moores A. Carrie Goltz MD; 05/13/2018 12:04 PM) Patient words: The patient is self-referred due to history of gallstones. She has been followed by CT scan for a right adrenal adenoma that is been stable. Stones were noted in her gallbladder earlier this year follow-up. She does have intermittent symptoms are upper quadrant pain after fatty greasy meals with nausea and vomiting. The symptoms are mild to moderate in intensity but have been increasing over time. She also has radiation of this pain or back times. Symptoms are at least weekly she thinks in the chronic discomfort may be daily she thinks. Fatty foods make it worse.  The patient is a 62 year old female.   Allergies [facility] [place], [name]; 05/13/2018 9:50 AM) Penicillins  Tetracyclines & Related  Ceclor *CEPHALOSPORINS*  Allergies Reconciled   Medication History (Carrie Reynolds, [name]; 05/13/2018 9:51 AM) Losartan Potassium-HCTZ (100-12.5MG  Tablet, Oral) Active. Pantoprazole Sodium (40MG  Tablet DR, Oral) Active. Ondansetron (4MG  Tablet Disint, Oral) Active. traZODone HCl (50MG  Tablet, Oral) Active. Topiramate (25MG  Tablet, Oral) Active. Sulfamethoxazole-Trimethoprim (800-160MG  Tablet, Oral) Active. Estrace (0.1MG /GM Cream, Vaginal) Active. Ibuprofen (800MG  Tablet, Oral) Active. Fish Oil (Oral) Specific strength unknown - Active. Vitamin D3 (Oral) Specific strength unknown - Active. Biotin (Oral) Specific strength unknown - Active. B Complex (Oral) Specific strength unknown - Active. Medications Reconciled  Social History Carrie Reynolds, [name]; 05/13/2018 9:49 AM) Alcohol use  Occasional alcohol use. No drug use  Tobacco use  Former smoker.  Family History Carrie Bers [place], [name]; 05/13/2018 9:49  AM) Diabetes Mellitus  Brother.  Other Problems Carrie Bers [place], [name]; 05/13/2018 9:49 AM) Cholelithiasis  Depression  Gastroesophageal Reflux Disease  High blood pressure     Review of Systems (Carrie Waldman A. Dyasia Firestine MD; 05/13/2018 12:05 PM) General Not Present- Appetite Loss, Chills, Fatigue, Fever, Night Sweats, Weight Gain and Weight Loss. Gastrointestinal Present- Abdominal Pain and Bloating. Not Present- Bloody Stool, Change in Bowel Habits, Chronic diarrhea, Constipation, Difficulty Swallowing, Excessive gas, Gets full quickly at meals, Hemorrhoids, Indigestion, Nausea, Rectal Pain and Vomiting. Musculoskeletal Present- Muscle Weakness. Not Present- Back Pain, Joint Pain, Joint Stiffness, Muscle Pain and Swelling of Extremities. Neurological Present- Decreased Memory. Not Present- Fainting, Headaches, Numbness, Seizures, Tingling, Tremor, Trouble walking and Weakness. Psychiatric Present- Anxiety and Frequent crying. Not Present- Bipolar, Change in Sleep Pattern, Depression and Fearful. All other systems negative  Vitals Carrie Bers Reynolds [name]; 05/13/2018 9:51 AM) 05/13/2018 9:51 AM Weight: 198.2 lb Height: 67in Body Surface Area: 2.01 m Body Mass Index: 31.04 kg/m  Pain Level: 1/10 Temp.: 97.78F(Temporal)  Pulse: 72 (Regular)  P.OX: 99% (Room air) BP: 120/72 (Sitting, Left Arm, Standard)       Physical Exam (Carrie Reynolds A. Carrie Breault MD; 05/13/2018 12:04 PM) General Mental Status-Alert. General Appearance-Consistent with stated age. Hydration-Well hydrated. Voice-Normal.  Head and Neck Head-normocephalic, atraumatic with no lesions or palpable masses. Trachea-midline. Thyroid Gland Characteristics - normal size and consistency.  Eye Eyeball - Bilateral-Extraocular movements intact. Sclera/Conjunctiva - Bilateral-No scleral icterus.  Chest and Lung Exam Chest and lung exam reveals -quiet, even and easy respiratory effort with no  use of accessory muscles and on auscultation, normal breath sounds, no adventitious sounds and normal vocal resonance. Inspection Chest Wall - Normal. Back - normal.  Cardiovascular Cardiovascular examination reveals -normal heart sounds, regular rate and rhythm with no murmurs and normal pedal pulses bilaterally.  Abdomen Note:  Soft nontender nondistended. Negative Murphy sign. 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   Neurologic Neurologic evaluation reveals -alert and oriented x 3 with no impairment of recent or remote memory. Mental Status-Normal.  Musculoskeletal Normal Exam - Left-Upper Extremity Strength Normal and Lower Extremity Strength Normal. Normal Exam - Right-Upper Extremity Strength Normal and Lower Extremity Strength Normal.    Assessment & Plan (Carrie Discher A. Carrie Arps MD; 05/13/2018 12:05 PM) SYMPTOMATIC CHOLELITHIASIS (K80.20) Impression: Discussed treatment options of symptomatic cholelithiasis. Discussed laparoscopic ostectomy versus observation. Discussed long-term expectations and complications of each approach. She has opted for laparoscopic cholecystectomy.   The procedure has been discussed with the patient. Risks of laparoscopic cholecystectomy include bleeding, infection, bile duct injury, leak, death, open surgery, diarrhea, other surgery, organ injury, blood vessel injury, DVT, and additional care. Current Plans You are being scheduled for surgery- Our schedulers will call you.  You should hear from our office's scheduling department within 5 working days about the location, date, and time of surgery. We try to make accommodations for patient's preferences in scheduling surgery, but sometimes the OR schedule or the surgeon's schedule prevents [place] from making those accommodations.  If you have not heard from our office [phone]) in 5 working days, call the office and ask for your surgeon's nurse.  If you have  other questions about your diagnosis, plan, or surgery, call the office and ask for your surgeon's nurse.  Pt Education - Pamphlet Given - Laparoscopic Gallbladder Surgery: discussed with patient and provided information. Written instructions provided Pt Education - Laparoscopic Cholecystectomy: gallbladder The anatomy & physiology of hepatobiliary & pancreatic function was discussed. The pathophysiology of gallbladder dysfunction was discussed. Natural history risks without surgery was discussed. I feel the risks of no intervention will lead to serious problems that outweigh the operative risks; therefore, I recommended cholecystectomy to remove the pathology. I explained laparoscopic techniques with possible need for an open approach. Probable cholangiogram to evaluate the bilary tract was explained as well.  Risks such as bleeding, infection, abscess, leak, injury to other organs, need for further treatment, heart attack, death, and other risks were discussed. I noted a good likelihood this will help address the problem. Possibility that this will not correct all abdominal symptoms was explained. Goals of post-operative recovery were discussed as well. We will work to minimize complications. An educational handout further explaining the pathology and treatment options was given as well. Questions were answered. The patient expresses understanding & wishes to proceed with surgery.    Signed by Turner Daniels, MD (05/13/2018 12:06 PM)

## 2018-05-30 NOTE — H&P (View-Only) (Signed)
Carrie Reynolds Documented: 05/13/2018 9:49 AM Location: [facility] Patient #: [id number] DOB: 02-24-1956 Married / Language: English / Race: White Female   History of Present Illness Carrie Moores A. Azile Minardi MD; 05/13/2018 12:04 PM) Patient words: The patient is self-referred due to history of gallstones. She has been followed by CT scan for a right adrenal adenoma that is been stable. Stones were noted in her gallbladder earlier this year follow-up. She does have intermittent symptoms are upper quadrant pain after fatty greasy meals with nausea and vomiting. The symptoms are mild to moderate in intensity but have been increasing over time. She also has radiation of this pain or back times. Symptoms are at least weekly she thinks in the chronic discomfort may be daily she thinks. Fatty foods make it worse.  The patient is a 62 year old female.   Allergies [name], [name]; 05/13/2018 9:50 AM) Penicillins  Tetracyclines & Related  Ceclor *CEPHALOSPORINS*  Allergies Reconciled   Medication History ([name], [name]; 05/13/2018 9:51 AM) Losartan Potassium-HCTZ (100-12.5MG  Tablet, Oral) Active. Pantoprazole Sodium (40MG  Tablet DR, Oral) Active. Ondansetron (4MG  Tablet Disint, Oral) Active. traZODone HCl (50MG  Tablet, Oral) Active. Topiramate (25MG  Tablet, Oral) Active. Sulfamethoxazole-Trimethoprim (800-160MG  Tablet, Oral) Active. Estrace (0.1MG /GM Cream, Vaginal) Active. Ibuprofen (800MG  Tablet, Oral) Active. Fish Oil (Oral) Specific strength unknown - Active. Vitamin D3 (Oral) Specific strength unknown - Active. Biotin (Oral) Specific strength unknown - Active. B Complex (Oral) Specific strength unknown - Active. Medications Reconciled  Social History [name], [name]; 05/13/2018 9:49 AM) Alcohol use  Occasional alcohol use. No drug use  Tobacco use  Former smoker.  Family History [name], [name]; 05/13/2018 9:49  AM) Diabetes Mellitus  Brother.  Other Problems [name], [name]; 05/13/2018 9:49 AM) Cholelithiasis  Depression  Gastroesophageal Reflux Disease  High blood pressure     Review of Systems ([name]; 05/13/2018 12:05 PM) General Not Present- Appetite Loss, Chills, Fatigue, Fever, Night Sweats, Weight Gain and Weight Loss. Gastrointestinal Present- Abdominal Pain and Bloating. Not Present- Bloody Stool, Change in Bowel Habits, Chronic diarrhea, Constipation, Difficulty Swallowing, Excessive gas, Gets full quickly at meals, Hemorrhoids, Indigestion, Nausea, Rectal Pain and Vomiting. Musculoskeletal Present- Muscle Weakness. Not Present- Back Pain, Joint Pain, Joint Stiffness, Muscle Pain and Swelling of Extremities. Neurological Present- Decreased Memory. Not Present- Fainting, Headaches, Numbness, Seizures, Tingling, Tremor, Trouble walking and Weakness. Psychiatric Present- Anxiety and Frequent crying. Not Present- Bipolar, Change in Sleep Pattern, Depression and Fearful. All other systems negative  Vitals Geni Bers [place] [name]; 05/13/2018 9:51 AM) 05/13/2018 9:51 AM Weight: 198.2 lb Height: 67in Body Surface Area: 2.01 m Body Mass Index: 31.04 kg/m  Pain Level: 1/10 Temp.: 97.27F(Temporal)  Pulse: 72 (Regular)  P.OX: 99% (Room air) BP: 120/72 (Sitting, Left Arm, Standard)       Physical Exam ([name]; 05/13/2018 12:04 PM) General Mental Status-Alert. General Appearance-Consistent with stated age. Hydration-Well hydrated. Voice-Normal.  Head and Neck Head-normocephalic, atraumatic with no lesions or palpable masses. Trachea-midline. Thyroid Gland Characteristics - normal size and consistency.  Eye Eyeball - Bilateral-Extraocular movements intact. Sclera/Conjunctiva - Bilateral-No scleral icterus.  Chest and Lung Exam Chest and lung exam reveals -quiet, even and easy respiratory effort with no  use of accessory muscles and on auscultation, normal breath sounds, no adventitious sounds and normal vocal resonance. Inspection Chest Wall - Normal. Back - normal.  Cardiovascular Cardiovascular examination reveals -normal heart sounds, regular rate and rhythm with no murmurs and normal pedal pulses bilaterally.  Abdomen Note:  Soft nontender nondistended. Negative Murphy sign. 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   Neurologic Neurologic evaluation reveals -alert and oriented x 3 with no impairment of recent or remote memory. Mental Status-Normal.  Musculoskeletal Normal Exam - Left-Upper Extremity Strength Normal and Lower Extremity Strength Normal. Normal Exam - Right-Upper Extremity Strength Normal and Lower Extremity Strength Normal.    Assessment & Plan ([name]; 05/13/2018 12:05 PM) SYMPTOMATIC CHOLELITHIASIS (K80.20) Impression: Discussed treatment options of symptomatic cholelithiasis. Discussed laparoscopic ostectomy versus observation. Discussed long-term expectations and complications of each approach. She has opted for laparoscopic cholecystectomy.   The procedure has been discussed with the patient. Risks of laparoscopic cholecystectomy include bleeding, infection, bile duct injury, leak, death, open surgery, diarrhea, other surgery, organ injury, blood vessel injury, DVT, and additional care. Current Plans You are being scheduled for surgery- Our schedulers will call you.  You should hear from our office's scheduling department within 5 working days about the location, date, and time of surgery. We try to make accommodations for patient's preferences in scheduling surgery, but sometimes the OR schedule or the surgeon's schedule prevents [place] from making those accommodations.  If you have not heard from our office [phone]) in 5 working days, call the office and ask for your surgeon's nurse.  If you have  other questions about your diagnosis, plan, or surgery, call the office and ask for your surgeon's nurse.  Pt Education - Pamphlet Given - Laparoscopic Gallbladder Surgery: discussed with patient and provided information. Written instructions provided Pt Education - Laparoscopic Cholecystectomy: gallbladder The anatomy & physiology of hepatobiliary & pancreatic function was discussed. The pathophysiology of gallbladder dysfunction was discussed. Natural history risks without surgery was discussed. I feel the risks of no intervention will lead to serious problems that outweigh the operative risks; therefore, I recommended cholecystectomy to remove the pathology. I explained laparoscopic techniques with possible need for an open approach. Probable cholangiogram to evaluate the bilary tract was explained as well.  Risks such as bleeding, infection, abscess, leak, injury to other organs, need for further treatment, heart attack, death, and other risks were discussed. I noted a good likelihood this will help address the problem. Possibility that this will not correct all abdominal symptoms was explained. Goals of post-operative recovery were discussed as well. We will work to minimize complications. An educational handout further explaining the pathology and treatment options was given as well. Questions were answered. The patient expresses understanding & wishes to proceed with surgery.    Signed by Turner Daniels, MD (05/13/2018 12:06 PM)

## 2018-06-02 NOTE — Telephone Encounter (Signed)
Pt is scheduled to see Rosaria Ferries, PA-C, 06/03/18 @ 2:00

## 2018-06-02 NOTE — Telephone Encounter (Signed)
Spoke with [facility] @ Dr. Thurman Coyer office  She confirms that 07-24-17 was the last time pt was seen by him. He did refer her to Dr. Caryl Comes, but pt didn't keep that appt

## 2018-06-02 NOTE — Telephone Encounter (Signed)
   Primary Cardiologist: Dr. Ellyn Hack (previously seen)   Chart reviewed as part of pre-operative protocol coverage. Because of Carrie Reynolds's past medical history and time since last visit, he/she will require a follow-up visit in order to better assess preoperative cardiovascular risk.  Pre-op covering staff: - Please schedule appointment and call patient to inform them. - Please contact requesting surgeon's office via preferred method (i.e, phone, fax) to inform them of need for appointment prior to surgery.    [place], [place]  06/02/2018, 2:10 PM

## 2018-06-03 ENCOUNTER — Encounter (HOSPITAL_COMMUNITY): Payer: Self-pay | Admitting: *Deleted

## 2018-06-03 ENCOUNTER — Encounter: Payer: Self-pay | Admitting: Physician Assistant

## 2018-06-03 ENCOUNTER — Telehealth: Payer: Self-pay

## 2018-06-03 ENCOUNTER — Ambulatory Visit (INDEPENDENT_AMBULATORY_CARE_PROVIDER_SITE_OTHER): Payer: 59 | Admitting: Physician Assistant

## 2018-06-03 VITALS — BP 132/76 | HR 55 | Ht 67.0 in | Wt 196.0 lb

## 2018-06-03 DIAGNOSIS — E785 Hyperlipidemia, unspecified: Secondary | ICD-10-CM

## 2018-06-03 DIAGNOSIS — Z01810 Encounter for preprocedural cardiovascular examination: Secondary | ICD-10-CM | POA: Diagnosis not present

## 2018-06-03 DIAGNOSIS — I10 Essential (primary) hypertension: Secondary | ICD-10-CM | POA: Diagnosis not present

## 2018-06-03 DIAGNOSIS — Z87898 Personal history of other specified conditions: Secondary | ICD-10-CM | POA: Diagnosis not present

## 2018-06-03 NOTE — Patient Instructions (Signed)
Medication Instructions:  Your physician recommends that you continue on your current medications as directed. Please refer to the Current Medication list given to you today.  If you need a refill on your cardiac medications before your next appointment, please call your pharmacy.   Lab work: I will get your lab work from [place] manager.  If you have labs (blood work) drawn today and your tests are completely normal, you will receive your results only by: [name] MyChart Message (if you have MyChart) OR . A paper copy in the mail If you have any lab test that is abnormal or we need to change your treatment, we will call you to review the results.  Testing/Procedures: none  Follow-Up: At [facility], you and your health needs are our priority.  As part of our continuing mission to provide you with exceptional heart care, we have created designated Provider Care Teams.  These Care Teams include your primary Cardiologist (physician) and Advanced Practice Providers (APPs -  Physician Assistants and Nurse Practitioners) who all work together to provide you with the care you need, when you need it. You will need a follow up appointment in 12 months.  Please call our office 2 months in advance to schedule this appointment.  You may see [name], MD or one of the following Advanced Practice Providers on your designated Care Team:   Rosaria Ferries, PA-C . [name], DNP, ANP  Any Other Special Instructions Will Be Listed Below (If Applicable).

## 2018-06-03 NOTE — Telephone Encounter (Signed)
Called pt to discuss her options concerning her LDL. Pt states she would make dietary changes and follow up with Rosaria Ferries in 3 mons. Please call pt to schedule appt. Thank you.

## 2018-06-03 NOTE — Progress Notes (Addendum)
Cardiology Office Note   Date:  06/03/2018   ID:  Carrie Reynolds, DOB 1956-05-23, MRN [id number]  PCP:  Leonard Downing, MD Cardiologist:  [name], MD  04/18/2017 Dr. Wynonia Lawman, 07/24/2017 office consult for recurrent syncope, patient referred to Dr. Caryl Comes but has never seen him Rosaria Ferries, PA-C   No chief complaint on file.   History of Present Illness: Carrie Reynolds is a 62 y.o. female with a history of recurrent syncope and dizziness, HTN, migraines, OSA not treated, gastric bypass w/ wt loss  11/11 phone notes regarding medical clearance for laparoscopic cholecystectomy, appointment made  Carrie Reynolds presents for cardiology follow up and clearance.   She quit her job to reduce her stress level. This worked. She takes care of her grandchildren. That does not stress her.   She does not exercise strenuously, has not since a concussion 09/2017. Has balance problems from that. She and her husband walk about 2 miles twice a week. She does not get CP or SOB w/ exertion. That is the most strenuous thing she does. When she visits her daughter, she has to climb stairs to the second floor, no CP or SOB w/ this.   She never saw Dr Caryl Comes for the syncope. She has not had any syncope or presyncope since then. It may have been related to dehydration from so much traveling and an inconsistent schedule, in conjunction with the gastric bypass.   She lost a great deal of weight after the gastric bypass, but has gained at least 20 lbs back. This is from eating poorly and not exercising.   She never gets chest pain.  Never gets palpitations any more.  Reducing her stress level completely eliminated the palpitations.  No more presyncope or syncope.  No LE edema, no orthopnea or PND.  She is aware that her heart rate runs low, but never feels like it gives her symptoms.  She is not sure when was the last time Dr. Arelia Sneddon checked her cholesterol.   Past Medical  History:  Diagnosis Date  . Arnold-Chiari malformation ([place]) 2006  . Cervical strain    syndrome  . Cervicogenic headache   . CTS (carpal tunnel syndrome)   . Depression   . Glucose intolerance (impaired glucose tolerance)   . Hypertension   . Migraine headache    with visual aura  . Obesity   . OSA (obstructive sleep apnea)    not treated  . Postmenopausal   . Vertigo    post concussive    Past Surgical History:  Procedure Laterality Date  . ABDOMINAL HYSTERECTOMY  2003   partial- L ovary remains  . ABDOMINAL HYSTERECTOMY    . BUNIONECTOMY     right great toe  . GASTRIC BYPASS  11/2015   [facility]  . HAND SURGERY    . TUBAL LIGATION Bilateral     Current Outpatient Medications  Medication Sig Dispense Refill  . losartan-hydrochlorothiazide (HYZAAR) 100-12.5 MG tablet Take 1 tablet by mouth at bedtime.    . pantoprazole (PROTONIX) 40 MG tablet Take 40 mg by mouth at bedtime.    . senna-docusate (SENOKOT-S) 8.6-50 MG tablet Take 1 tablet by mouth at bedtime.    . sertraline (ZOLOFT) 50 MG tablet Take 100 mg by mouth at bedtime.     No current facility-administered medications for this visit.     Allergies:   Ceclor [cefaclor]; Tetracyclines & related; Amoxicillin; Ampicillin; Ceclor [cefaclor]; Lipitor [atorvastatin calcium]; Penicillins; Tetracyclines &  related; Amoxicillin; Ampicillin; Atorvastatin; Lisinopril; Simvastatin; and Tetracycline    Social History:  The patient  reports that she quit smoking about 6 years ago. Her smoking use included cigarettes. She has a 3.00 pack-year smoking history. She has never used smokeless tobacco. She reports that she drinks alcohol. She reports that she does not use drugs.   Family History:  The patient's family history includes Alzheimer's disease in her father and paternal grandmother; Cancer in her father and paternal grandfather; Diabetes Mellitus II in her sister; Hyperlipidemia in her sister; Hypertension in her mother and  sister; Migraines in her father.  She indicated that her mother is alive. She indicated that her father is deceased. She indicated that her sister is alive. She indicated that both of her brothers are alive. She indicated that her maternal grandmother is deceased. She indicated that her maternal grandfather is deceased. She indicated that her paternal grandmother is deceased. She indicated that her paternal grandfather is deceased. She indicated that her daughter is alive. She indicated that her son is alive.   ROS:  Please see the history of present illness. All other systems are reviewed and negative.    PHYSICAL EXAM: VS:  BP 132/76   Pulse (!) 55   Ht 5\' 7"  (1.702 m)   Wt 196 lb (88.9 kg)   SpO2 98%   BMI 30.70 kg/m  , BMI Body mass index is 30.7 kg/m. GEN: Well nourished, well developed, female in no acute distress HEENT: normal for age  Neck: no JVD, no carotid bruit, no masses Cardiac: RRR; 2/6 murmur that radiates to the carotids, no rubs, or gallops Respiratory:  clear to auscultation bilaterally, normal work of breathing GI: soft, nontender, nondistended, + BS MS: no deformity or atrophy; no edema; distal pulses are 2+ in all 4 extremities  Skin: warm and dry, no rash Neuro:  Strength and sensation are intact Psych: euthymic mood, full affect   EKG:  EKG is ordered today. The ekg ordered today demonstrates sinus bradycardia, heart rate 55, borderline LVH  ECHO: 05/01/2017 - Left ventricle: The cavity size was normal. Systolic function was   normal. The estimated ejection fraction was in the range of 60%   to 65%. Diastolic dysfunction, grade indeterminate. Wall motion   was normal; there were no regional wall motion abnormalities.   Mild concentric and severe focal basal septal hypertrophy. - Mitral valve: There was mild regurgitation.  MONITOR: 04/2017  The patient was monitored for 10 days: 05/01/2017 - 05/11/2017.  Heart rate ranged from 44 bpm (sinus  bradycardia) to 107 bpm (sinus tachycardia). Average heart rate 64 bpm  Very rare PVCs noted during sinus bradycardia,. Otherwise no significant ectopy.  No arrhythmias: A. fib, SVT, PAT, VT  The patient had no symptoms while wearing the monitor.   Recent Labs: 03/06/2018: ALT 18; BUN 17; Creatinine, Ser 0.88; Hemoglobin 13.4; Platelets 189; Potassium 3.5; Sodium 140  CBC    Component Value Date/Time   WBC 4.5 03/06/2018 1054   RBC 4.67 03/06/2018 1054   HGB 13.4 03/06/2018 1054   HCT 40.6 03/06/2018 1054   PLT 189 03/06/2018 1054   MCV 86.9 03/06/2018 1054   MCV 95.0 12/10/2012 0915   MCH 28.7 03/06/2018 1054   MCHC 33.0 03/06/2018 1054   RDW 13.2 03/06/2018 1054   LYMPHSABS 1.5 03/06/2018 1054   MONOABS 0.3 03/06/2018 1054   EOSABS 0.1 03/06/2018 1054   BASOSABS 0.0 03/06/2018 1054   CMP Latest Ref Rng & Units  03/06/2018 10/17/2017 10/17/2017  Glucose 70 - 99 mg/dL 97 105(H) 111(H)  BUN 6 - 20 mg/dL 17 21(H) 18  Creatinine 0.44 - 1.00 mg/dL 0.88 0.80 0.88  Sodium 135 - 145 mmol/L 140 141 138  Potassium 3.5 - 5.1 mmol/L 3.5 3.2(L) 3.2(L)  Chloride 98 - 111 mmol/L 110 105 106  CO2 22 - 32 mmol/L 20(L) - 21(L)  Calcium 8.9 - 10.3 mg/dL 9.5 - 9.3  Total Protein 6.5 - 8.1 g/dL 6.8 - 6.9  Total Bilirubin 0.3 - 1.2 mg/dL 1.1 - 0.9  Alkaline Phos 38 - 126 U/L 59 - 74  AST 15 - 41 U/L 19 - 22  ALT 0 - 44 U/L 18 - 21     Lipid Panel Lab Results  Component Value Date   CHOL 265 (H) 04/25/2012   HDL 48 04/25/2012   LDLCALC 141 (H) 04/25/2012   TRIG 379 (H) 04/25/2012   CHOLHDL 5.5 04/25/2012      Wt Readings from Last 3 Encounters:  06/03/18 196 lb (88.9 kg)  03/06/18 195 lb (88.5 kg)  10/17/17 160 lb (72.6 kg)     Other studies Reviewed: Additional studies/ records that were reviewed today include: Office notes, hospital records and testing.  ASSESSMENT AND PLAN:  1.  Preop cardiovascular exam: - Her RCRI score gives her a 0.4% perioperative risk of major  cardiac events - Her [facility] activity status index calculates for functional capacity and METS of 6.45. -She is at acceptable risk for the planned procedure without further cardiac work-up. -This note was sent by internal fax to Dr. Brantley Stage at [facility] surgery  2.  History of syncope: -She is managing her p.o. intake better and staying hydrated. -She has not had any symptoms since she quit her job and her schedule stabilized -She was advised that if she has any additional syncope episodes, she is not to drive for 6 months.  3.  Hypertension: Her blood pressure is well controlled on current therapy, no changes.  4.  Hyperlipidemia: Last labs in our system are from 2013, try to get labs from her PCP.   Current medicines are reviewed at length with the patient today.  The patient does not have concerns regarding medicines.  The following changes have been made:  no change  Labs/ tests ordered today include:  No orders of the defined types were placed in this encounter.    Disposition:   FU with [name], MD in 1 year  Signed, Rosaria Ferries, PA-C  06/03/2018 2:11 PM    [employer] Phone: [phone]; Fax: [phone]

## 2018-06-04 ENCOUNTER — Encounter (HOSPITAL_COMMUNITY): Payer: Self-pay | Admitting: *Deleted

## 2018-06-04 ENCOUNTER — Other Ambulatory Visit: Payer: Self-pay

## 2018-06-04 NOTE — Telephone Encounter (Addendum)
Contacted patient and she stated she would to follow up with Suanne Marker in 3 months. Patient declined follow up with Dr Ellyn Hack but would rather see Suanne Marker only. I advised patient that the scheduled is not open that far and to call back next month and make her appt. She voiced understanding.

## 2018-06-04 NOTE — Progress Notes (Signed)
Pt pre-op phone call complete.  NPO after MN except for clear liquids until Noon per [facility]AS diet protocol.  Pt instructed to take all meds as prescribed tonight, nothing in AM. Denies CP, SOB, fever, cough.

## 2018-06-05 ENCOUNTER — Ambulatory Visit (HOSPITAL_COMMUNITY): Payer: 59 | Admitting: Anesthesiology

## 2018-06-05 ENCOUNTER — Ambulatory Visit (HOSPITAL_COMMUNITY)
Admission: RE | Admit: 2018-06-05 | Discharge: 2018-06-05 | Disposition: A | Discharge status: Home / Self Care | Payer: 59 | Source: Ambulatory Visit | Attending: Surgery | Admitting: Surgery

## 2018-06-05 ENCOUNTER — Other Ambulatory Visit: Payer: Self-pay

## 2018-06-05 ENCOUNTER — Encounter (HOSPITAL_COMMUNITY)
Admission: RE | Disposition: A | Discharge status: Home / Self Care | Payer: Self-pay | Source: Ambulatory Visit | Attending: Surgery

## 2018-06-05 ENCOUNTER — Encounter (HOSPITAL_COMMUNITY): Payer: Self-pay

## 2018-06-05 DIAGNOSIS — Z880 Allergy status to penicillin: Secondary | ICD-10-CM | POA: Diagnosis not present

## 2018-06-05 DIAGNOSIS — F329 Major depressive disorder, single episode, unspecified: Secondary | ICD-10-CM | POA: Insufficient documentation

## 2018-06-05 DIAGNOSIS — Z9884 Bariatric surgery status: Secondary | ICD-10-CM | POA: Diagnosis not present

## 2018-06-05 DIAGNOSIS — K219 Gastro-esophageal reflux disease without esophagitis: Secondary | ICD-10-CM | POA: Diagnosis not present

## 2018-06-05 DIAGNOSIS — I429 Cardiomyopathy, unspecified: Secondary | ICD-10-CM | POA: Insufficient documentation

## 2018-06-05 DIAGNOSIS — E669 Obesity, unspecified: Secondary | ICD-10-CM | POA: Insufficient documentation

## 2018-06-05 DIAGNOSIS — I10 Essential (primary) hypertension: Secondary | ICD-10-CM | POA: Diagnosis not present

## 2018-06-05 DIAGNOSIS — Z6830 Body mass index (BMI) 30.0-30.9, adult: Secondary | ICD-10-CM | POA: Diagnosis not present

## 2018-06-05 DIAGNOSIS — Z79890 Hormone replacement therapy (postmenopausal): Secondary | ICD-10-CM | POA: Insufficient documentation

## 2018-06-05 DIAGNOSIS — K8010 Calculus of gallbladder with chronic cholecystitis without obstruction: Secondary | ICD-10-CM | POA: Insufficient documentation

## 2018-06-05 DIAGNOSIS — Z79899 Other long term (current) drug therapy: Secondary | ICD-10-CM | POA: Insufficient documentation

## 2018-06-05 DIAGNOSIS — Z881 Allergy status to other antibiotic agents status: Secondary | ICD-10-CM | POA: Diagnosis not present

## 2018-06-05 DIAGNOSIS — K8020 Calculus of gallbladder without cholecystitis without obstruction: Secondary | ICD-10-CM | POA: Diagnosis present

## 2018-06-05 DIAGNOSIS — Z87891 Personal history of nicotine dependence: Secondary | ICD-10-CM | POA: Insufficient documentation

## 2018-06-05 HISTORY — DX: Concussion with loss of consciousness of unspecified duration, initial encounter: S06.0X9A

## 2018-06-05 HISTORY — DX: Anxiety disorder, unspecified: F41.9

## 2018-06-05 HISTORY — PX: CHOLECYSTECTOMY: SHX55

## 2018-06-05 HISTORY — DX: Cardiomyopathy, unspecified: I42.9

## 2018-06-05 HISTORY — DX: Personal history of urinary calculi: Z87.442

## 2018-06-05 HISTORY — DX: Gastro-esophageal reflux disease without esophagitis: K21.9

## 2018-06-05 LAB — CBC WITH DIFFERENTIAL/PLATELET
ABS IMMATURE GRANULOCYTES: 0.01 10*3/uL (ref 0.00–0.07)
Basophils Absolute: 0 10*3/uL (ref 0.0–0.1)
Basophils Relative: 1 %
EOS ABS: 0.1 10*3/uL (ref 0.0–0.5)
Eosinophils Relative: 2 %
HEMATOCRIT: 41.5 % (ref 36.0–46.0)
Hemoglobin: 12.9 g/dL (ref 12.0–15.0)
Immature Granulocytes: 0 %
LYMPHS PCT: 40 %
Lymphs Abs: 2 10*3/uL (ref 0.7–4.0)
MCH: 27.7 pg (ref 26.0–34.0)
MCHC: 31.1 g/dL (ref 30.0–36.0)
MCV: 89.1 fL (ref 80.0–100.0)
MONO ABS: 0.4 10*3/uL (ref 0.1–1.0)
Monocytes Relative: 8 %
NEUTROS ABS: 2.4 10*3/uL (ref 1.7–7.7)
NRBC: 0 % (ref 0.0–0.2)
Neutrophils Relative %: 49 %
Platelets: 216 10*3/uL (ref 150–400)
RBC: 4.66 MIL/uL (ref 3.87–5.11)
RDW: 13.8 % (ref 11.5–15.5)
WBC: 4.8 10*3/uL (ref 4.0–10.5)

## 2018-06-05 LAB — COMPREHENSIVE METABOLIC PANEL
ALT: 21 U/L (ref 0–44)
AST: 19 U/L (ref 15–41)
Albumin: 3.9 g/dL (ref 3.5–5.0)
Alkaline Phosphatase: 59 U/L (ref 38–126)
Anion gap: 9 (ref 5–15)
BILIRUBIN TOTAL: 0.7 mg/dL (ref 0.3–1.2)
BUN: 19 mg/dL (ref 6–20)
CALCIUM: 9.1 mg/dL (ref 8.9–10.3)
CO2: 19 mmol/L — ABNORMAL LOW (ref 22–32)
CREATININE: 0.8 mg/dL (ref 0.44–1.00)
Chloride: 110 mmol/L (ref 98–111)
GFR calc Af Amer: >60 mL/min (ref >60)
Glucose, Bld: 99 mg/dL (ref 70–99)
Potassium: 3.7 mmol/L (ref 3.5–5.1)
Sodium: 138 mmol/L (ref 135–145)
TOTAL PROTEIN: 6.7 g/dL (ref 6.5–8.1)

## 2018-06-05 SURGERY — LAPAROSCOPIC CHOLECYSTECTOMY WITH INTRAOPERATIVE CHOLANGIOGRAM
Anesthesia: General | Site: Abdomen

## 2018-06-05 MED ORDER — CELECOXIB 200 MG PO CAPS
ORAL_CAPSULE | ORAL | Status: AC
  Administered 2018-06-05: 200 mg via ORAL
  Filled 2018-06-05: qty 1

## 2018-06-05 MED ORDER — FENTANYL CITRATE (PF) 100 MCG/2ML IJ SOLN
25.0000 ug | INTRAMUSCULAR | Status: DC | PRN
  Administered 2018-06-05: 50 ug via INTRAVENOUS

## 2018-06-05 MED ORDER — PROMETHAZINE HCL 25 MG/ML IJ SOLN: 6.2500 mg | INTRAMUSCULAR | Status: DC | PRN

## 2018-06-05 MED ORDER — DEXAMETHASONE SODIUM PHOSPHATE 10 MG/ML IJ SOLN
INTRAMUSCULAR | Status: DC | PRN
  Administered 2018-06-05: 10 mg via INTRAVENOUS

## 2018-06-05 MED ORDER — FENTANYL CITRATE (PF) 250 MCG/5ML IJ SOLN
INTRAMUSCULAR | Status: AC
  Filled 2018-06-05: qty 5

## 2018-06-05 MED ORDER — CHLORHEXIDINE GLUCONATE CLOTH 2 % EX PADS: 6.0000 | MEDICATED_PAD | Freq: Once | CUTANEOUS | Status: DC

## 2018-06-05 MED ORDER — PROPOFOL 10 MG/ML IV BOLUS
INTRAVENOUS | Status: DC | PRN
  Administered 2018-06-05: 200 mg via INTRAVENOUS

## 2018-06-05 MED ORDER — GLYCOPYRROLATE PF 0.2 MG/ML IJ SOSY
PREFILLED_SYRINGE | INTRAMUSCULAR | Status: AC
  Filled 2018-06-05: qty 1

## 2018-06-05 MED ORDER — BUPIVACAINE-EPINEPHRINE (PF) 0.25% -1:200000 IJ SOLN
INTRAMUSCULAR | Status: AC
  Filled 2018-06-05: qty 30

## 2018-06-05 MED ORDER — GABAPENTIN 300 MG PO CAPS
300.0000 mg | ORAL_CAPSULE | ORAL | Status: AC
  Administered 2018-06-05: 300 mg via ORAL

## 2018-06-05 MED ORDER — OXYCODONE HCL 5 MG PO TABS: 5.0000 mg | ORAL_TABLET | Freq: Once | ORAL | Status: DC | PRN

## 2018-06-05 MED ORDER — OXYCODONE HCL 5 MG/5ML PO SOLN: 5.0000 mg | Freq: Once | ORAL | Status: DC | PRN

## 2018-06-05 MED ORDER — SODIUM CHLORIDE 0.9 % IR SOLN
Status: DC | PRN
  Administered 2018-06-05: 1

## 2018-06-05 MED ORDER — GLYCOPYRROLATE 0.2 MG/ML IJ SOLN
INTRAMUSCULAR | Status: DC | PRN
  Administered 2018-06-05 (×2): 0.1 mg via INTRAVENOUS

## 2018-06-05 MED ORDER — OXYCODONE HCL 5 MG PO TABS: 5.0000 mg | ORAL_TABLET | Freq: Four times a day (QID) | ORAL | 0 refills | Status: DC | PRN

## 2018-06-05 MED ORDER — SUGAMMADEX SODIUM 200 MG/2ML IV SOLN
INTRAVENOUS | Status: DC | PRN
  Administered 2018-06-05: 350 mg via INTRAVENOUS

## 2018-06-05 MED ORDER — IOPAMIDOL (ISOVUE-300) INJECTION 61%
INTRAVENOUS | Status: AC
  Filled 2018-06-05: qty 50

## 2018-06-05 MED ORDER — SUGAMMADEX SODIUM 200 MG/2ML IV SOLN
INTRAVENOUS | Status: AC
  Filled 2018-06-05: qty 2

## 2018-06-05 MED ORDER — MIDAZOLAM HCL 2 MG/2ML IJ SOLN
INTRAMUSCULAR | Status: DC | PRN
Start: 2018-06-05 — End: 2018-06-05
  Administered 2018-06-05 (×2): 1 mg via INTRAVENOUS

## 2018-06-05 MED ORDER — DEXAMETHASONE SODIUM PHOSPHATE 10 MG/ML IJ SOLN
INTRAMUSCULAR | Status: AC
  Filled 2018-06-05: qty 1

## 2018-06-05 MED ORDER — FENTANYL CITRATE (PF) 100 MCG/2ML IJ SOLN
INTRAMUSCULAR | Status: AC
  Filled 2018-06-05: qty 2

## 2018-06-05 MED ORDER — LIDOCAINE 2% (20 MG/ML) 5 ML SYRINGE
INTRAMUSCULAR | Status: DC | PRN
  Administered 2018-06-05: 60 mg via INTRAVENOUS

## 2018-06-05 MED ORDER — ONDANSETRON HCL 4 MG/2ML IJ SOLN
INTRAMUSCULAR | Status: DC | PRN
  Administered 2018-06-05: 4 mg via INTRAVENOUS

## 2018-06-05 MED ORDER — IBUPROFEN 800 MG PO TABS: 800.0000 mg | ORAL_TABLET | Freq: Three times a day (TID) | ORAL | 0 refills | Status: DC | PRN

## 2018-06-05 MED ORDER — ONDANSETRON HCL 4 MG/2ML IJ SOLN
INTRAMUSCULAR | Status: AC
  Filled 2018-06-05: qty 2

## 2018-06-05 MED ORDER — CELECOXIB 200 MG PO CAPS
200.0000 mg | ORAL_CAPSULE | ORAL | Status: AC
  Administered 2018-06-05: 200 mg via ORAL

## 2018-06-05 MED ORDER — EPHEDRINE SULFATE-NACL 50-0.9 MG/10ML-% IV SOSY
PREFILLED_SYRINGE | INTRAVENOUS | Status: DC | PRN
  Administered 2018-06-05: 10 mg via INTRAVENOUS
  Administered 2018-06-05: 5 mg via INTRAVENOUS

## 2018-06-05 MED ORDER — ATROPINE SULFATE 0.4 MG/ML IJ SOLN
INTRAMUSCULAR | Status: DC | PRN
  Administered 2018-06-05: .2 mg via INTRAVENOUS

## 2018-06-05 MED ORDER — LACTATED RINGERS IV SOLN
INTRAVENOUS | Status: DC
  Administered 2018-06-05: 13:00:00 via INTRAVENOUS

## 2018-06-05 MED ORDER — ROCURONIUM BROMIDE 10 MG/ML (PF) SYRINGE
PREFILLED_SYRINGE | INTRAVENOUS | Status: DC | PRN
  Administered 2018-06-05: 50 mg via INTRAVENOUS
  Administered 2018-06-05: 20 mg via INTRAVENOUS

## 2018-06-05 MED ORDER — ACETAMINOPHEN 10 MG/ML IV SOLN: 1000.0000 mg | Freq: Once | INTRAVENOUS | Status: DC | PRN

## 2018-06-05 MED ORDER — PROPOFOL 10 MG/ML IV BOLUS
INTRAVENOUS | Status: AC
  Filled 2018-06-05: qty 20

## 2018-06-05 MED ORDER — FENTANYL CITRATE (PF) 250 MCG/5ML IJ SOLN
INTRAMUSCULAR | Status: DC | PRN
  Administered 2018-06-05: 100 ug via INTRAVENOUS

## 2018-06-05 MED ORDER — ACETAMINOPHEN 500 MG PO TABS
1000.0000 mg | ORAL_TABLET | ORAL | Status: AC
  Administered 2018-06-05: 1000 mg via ORAL

## 2018-06-05 MED ORDER — ROCURONIUM BROMIDE 50 MG/5ML IV SOSY
PREFILLED_SYRINGE | INTRAVENOUS | Status: AC
  Filled 2018-06-05: qty 5

## 2018-06-05 MED ORDER — CLINDAMYCIN PHOSPHATE 900 MG/50ML IV SOLN
900.0000 mg | INTRAVENOUS | Status: AC
  Administered 2018-06-05: 900 mg via INTRAVENOUS

## 2018-06-05 MED ORDER — GABAPENTIN 300 MG PO CAPS
ORAL_CAPSULE | ORAL | Status: AC
  Administered 2018-06-05: 300 mg via ORAL
  Filled 2018-06-05: qty 1

## 2018-06-05 MED ORDER — MIDAZOLAM HCL 2 MG/2ML IJ SOLN
INTRAMUSCULAR | Status: AC
  Filled 2018-06-05: qty 2

## 2018-06-05 MED ORDER — BUPIVACAINE-EPINEPHRINE 0.25% -1:200000 IJ SOLN
INTRAMUSCULAR | Status: DC | PRN
  Administered 2018-06-05: 6 mL

## 2018-06-05 MED ORDER — ACETAMINOPHEN 500 MG PO TABS
ORAL_TABLET | ORAL | Status: AC
  Administered 2018-06-05: 1000 mg via ORAL
  Filled 2018-06-05: qty 2

## 2018-06-05 MED ORDER — CLINDAMYCIN PHOSPHATE 900 MG/50ML IV SOLN
INTRAVENOUS | Status: AC
  Filled 2018-06-05: qty 50

## 2018-06-05 SURGICAL SUPPLY — 48 items
ADH SKN CLS APL DERMABOND .7 (GAUZE/BANDAGES/DRESSINGS) ×1
ADH SKN CLS LQ APL DERMABOND (GAUZE/BANDAGES/DRESSINGS) ×1
APPLIER CLIP ROT 10 11.4 M/L (STAPLE) ×3
APR CLP MED LRG 11.4X10 (STAPLE) ×1
BAG SPEC RTRVL 10 TROC 200 (ENDOMECHANICALS) ×1
BLADE CLIPPER SURG (BLADE) IMPLANT
CANISTER SUCT 3000ML PPV (MISCELLANEOUS) ×3 IMPLANT
CHLORAPREP W/TINT 26ML (MISCELLANEOUS) ×3 IMPLANT
CLIP APPLIE ROT 10 11.4 M/L (STAPLE) ×1 IMPLANT
COVER MAYO STAND STRL (DRAPES) ×3 IMPLANT
COVER SURGICAL LIGHT HANDLE (MISCELLANEOUS) ×3 IMPLANT
COVER WAND RF STERILE (DRAPES) ×3 IMPLANT
DERMABOND ADHESIVE PROPEN (GAUZE/BANDAGES/DRESSINGS) ×2
DERMABOND ADVANCED (GAUZE/BANDAGES/DRESSINGS) ×2
DERMABOND ADVANCED .7 DNX12 (GAUZE/BANDAGES/DRESSINGS) ×1 IMPLANT
DERMABOND ADVANCED .7 DNX6 (GAUZE/BANDAGES/DRESSINGS) IMPLANT
DRAPE C-ARM 42X72 X-RAY (DRAPES) ×3 IMPLANT
DRAPE WARM FLUID 44X44 (DRAPE) ×3 IMPLANT
ELECT REM PT RETURN 9FT ADLT (ELECTROSURGICAL) ×3
ELECTRODE REM PT RTRN 9FT ADLT (ELECTROSURGICAL) ×1 IMPLANT
GLOVE BIO SURGEON STRL SZ8 (GLOVE) ×3 IMPLANT
GLOVE BIOGEL PI IND STRL 8 (GLOVE) ×1 IMPLANT
GLOVE BIOGEL PI INDICATOR 8 (GLOVE) ×2
GOWN STRL REUS W/ TWL LRG LVL3 (GOWN DISPOSABLE) ×2 IMPLANT
GOWN STRL REUS W/ TWL XL LVL3 (GOWN DISPOSABLE) ×1 IMPLANT
GOWN STRL REUS W/TWL LRG LVL3 (GOWN DISPOSABLE) ×6
GOWN STRL REUS W/TWL XL LVL3 (GOWN DISPOSABLE) ×3
KIT BASIN OR (CUSTOM PROCEDURE TRAY) ×3 IMPLANT
KIT TURNOVER KIT B (KITS) ×3 IMPLANT
NS IRRIG 1000ML POUR BTL (IV SOLUTION) ×3 IMPLANT
PAD ARMBOARD 7.5X6 YLW CONV (MISCELLANEOUS) ×3 IMPLANT
POUCH RETRIEVAL ECOSAC 10 (ENDOMECHANICALS) ×1 IMPLANT
POUCH RETRIEVAL ECOSAC 10MM (ENDOMECHANICALS) ×2
SCISSORS LAP 5X35 DISP (ENDOMECHANICALS) ×3 IMPLANT
SET CHOLANGIOGRAPH 5 50 .035 (SET/KITS/TRAYS/PACK) ×3 IMPLANT
SET IRRIG TUBING LAPAROSCOPIC (IRRIGATION / IRRIGATOR) ×3 IMPLANT
SLEEVE ENDOPATH XCEL 5M (ENDOMECHANICALS) ×3 IMPLANT
SPECIMEN JAR SMALL (MISCELLANEOUS) ×3 IMPLANT
SUT MNCRL AB 4-0 PS2 18 (SUTURE) ×3 IMPLANT
SUT VICRYL 0 UR6 27IN ABS (SUTURE) ×2 IMPLANT
TOWEL OR 17X24 6PK STRL BLUE (TOWEL DISPOSABLE) ×3 IMPLANT
TOWEL OR 17X26 10 PK STRL BLUE (TOWEL DISPOSABLE) ×3 IMPLANT
TRAY LAPAROSCOPIC MC (CUSTOM PROCEDURE TRAY) ×3 IMPLANT
TROCAR XCEL BLUNT TIP 100MML (ENDOMECHANICALS) ×3 IMPLANT
TROCAR XCEL NON-BLD 11X100MML (ENDOMECHANICALS) ×3 IMPLANT
TROCAR XCEL NON-BLD 5MMX100MML (ENDOMECHANICALS) ×3 IMPLANT
TUBING INSUFFLATION (TUBING) ×3 IMPLANT
WATER STERILE IRR 1000ML POUR (IV SOLUTION) ×3 IMPLANT

## 2018-06-05 NOTE — Anesthesia Postprocedure Evaluation (Signed)
Anesthesia Post Note  Patient: Carrie Reynolds  Procedure(s) Performed: LAPAROSCOPIC CHOLECYSTECTOMY WITH POSSIBLE INTRAOPERATIVE CHOLANGIOGRAM (N/A Abdomen)     Patient location during evaluation: PACU Anesthesia Type: General Level of consciousness: awake and alert Pain management: pain level controlled Vital Signs Assessment: post-procedure vital signs reviewed and stable Respiratory status: spontaneous breathing, nonlabored ventilation and respiratory function stable Cardiovascular status: blood pressure returned to baseline and stable Postop Assessment: no apparent nausea or vomiting Anesthetic complications: no    Last Vitals:  Vitals:   06/05/18 1700 06/05/18 1715  BP: 126/71 129/64  Pulse: 71 77  Resp: 13 12  Temp:  36.6 C  SpO2: 100% 98%                  Audry Pili

## 2018-06-05 NOTE — Transfer of Care (Signed)
Immediate Anesthesia Transfer of Care Note  Patient: Carrie Reynolds  Procedure(s) Performed: LAPAROSCOPIC CHOLECYSTECTOMY WITH POSSIBLE INTRAOPERATIVE CHOLANGIOGRAM (N/A Abdomen)  Patient Location: PACU  Anesthesia Type:General  Level of Consciousness: drowsy and patient cooperative  Airway & Oxygen Therapy: Patient Spontanous Breathing  Post-op Assessment: Report given to RN, Post -op Vital signs reviewed and stable and Patient moving all extremities X 4  Post vital signs: Reviewed and stable  Last Vitals:  Vitals Value Taken Time  BP 139/81 06/05/2018  4:30 PM  Temp    Pulse 73 06/05/2018  4:31 PM  Resp 18 06/05/2018  4:31 PM  SpO2 93 % 06/05/2018  4:31 PM  Vitals shown include unvalidated device data.  Last Pain:  Vitals:   06/05/18 1630  TempSrc:   PainSc: (P) 0-No pain         Complications: No apparent anesthesia complications

## 2018-06-05 NOTE — Discharge Instructions (Signed)
[facility] ______[facility] °LAPAROSCOPIC SURGERY: POST OP INSTRUCTIONS °Always review your discharge instruction sheet given to you by the facility where your surgery was performed. °IF YOU HAVE DISABILITY OR FAMILY LEAVE FORMS, YOU MUST BRING THEM TO THE OFFICE FOR PROCESSING.   °DO NOT GIVE THEM TO YOUR DOCTOR. ° °1. A prescription for pain medication may be given to you upon discharge.  Take your pain medication as prescribed, if needed.  If narcotic pain medicine is not needed, then you may take acetaminophen (Tylenol) or ibuprofen (Advil) as needed. °2. Take your usually prescribed medications unless otherwise directed. °3. If you need a refill on your pain medication, please contact your pharmacy.  They will contact our office to request authorization. Prescriptions will not be filled after 5pm or on week-ends. °4. You should follow a light diet the first few days after arrival home, such as soup and crackers, etc.  Be sure to include lots of fluids daily. °5. Most patients will experience some swelling and bruising in the area of the incisions.  Ice packs will help.  Swelling and bruising can take several days to resolve.  °6. It is common to experience some constipation if taking pain medication after surgery.  Increasing fluid intake and taking a stool softener (such as Colace) will usually help or prevent this problem from occurring.  A mild laxative (Milk of Magnesia or Miralax) should be taken according to package instructions if there are no bowel movements after 48 hours. °7. Unless discharge instructions indicate otherwise, you may remove your bandages 24-48 hours after surgery, and you may shower at that time.  You may have steri-strips (small skin tapes) in place directly over the incision.  These strips should be left on the skin for 7-10 days.  If your surgeon used skin glue on the incision, you may shower in 24 hours.  The glue will flake off over the next 2-3 weeks.  Any sutures or  staples will be removed at the office during your follow-up visit. °8. ACTIVITIES:  You may resume regular (light) daily activities beginning the next day--such as daily self-care, walking, climbing stairs--gradually increasing activities as tolerated.  You may have sexual intercourse when it is comfortable.  Refrain from any heavy lifting or straining until approved by your doctor. °a. You may drive when you are no longer taking prescription pain medication, you can comfortably wear a seatbelt, and you can safely maneuver your car and apply brakes. °b. RETURN TO WORK:  __________________________________________________________ °9. You should see your doctor in the office for a follow-up appointment approximately 2-3 weeks after your surgery.  Make sure that you call for this appointment within a day or two after you arrive home to insure a convenient appointment time. °10. OTHER INSTRUCTIONS: __________________________________________________________________________________________________________________________ __________________________________________________________________________________________________________________________ °WHEN TO CALL YOUR DOCTOR: °1. Fever over 101.0 °2. Inability to urinate °3. Continued bleeding from incision. °4. Increased pain, redness, or drainage from the incision. °5. Increasing abdominal pain ° °The clinic staff is available to answer your questions during regular business hours.  Please don’t hesitate to call and ask to speak to one of the nurses for clinical concerns.  If you have a medical emergency, go to the nearest emergency room or call 911.  A surgeon from [facility] is always on call at the hospital. °[address], [address], [place], [place]  [address] ? [address] [address], [place], [place]   [address] °[phone] ? [phone] ? FAX [phone] °Web site:   [email or web address] °

## 2018-06-05 NOTE — Anesthesia Preprocedure Evaluation (Addendum)
Anesthesia Evaluation  Patient identified by MRN, date of birth, ID band Patient awake    Reviewed: Allergy & Precautions, NPO status , Patient's Chart, lab work & pertinent test results  History of Anesthesia Complications Negative for: history of anesthetic complications  Airway Mallampati: II  TM Distance: >3 FB Neck ROM: Full    Dental no notable dental hx. (+) Teeth Intact, Dental Advisory Given   Pulmonary sleep apnea , former smoker,    Pulmonary exam normal breath sounds clear to auscultation       Cardiovascular hypertension, Pt. on medications Normal cardiovascular exam Rhythm:Regular Rate:Normal  EF 60-65%, indeterminate grade diastolic dysfunction   Neuro/Psych  Headaches, Anxiety Depression Arnold Chiari malformation    GI/Hepatic Neg liver ROS, GERD  Medicated,  Endo/Other  negative endocrine ROS  Renal/GU negative Renal ROS     Musculoskeletal negative musculoskeletal ROS (+)   Abdominal   Peds  Hematology negative hematology ROS (+)   Anesthesia Other Findings Day of surgery medications reviewed with the patient.  Reproductive/Obstetrics                            Anesthesia Physical Anesthesia Plan  ASA: II  Anesthesia Plan: General   Post-op Pain Management:    Induction: Intravenous  PONV Risk Score and Plan: 3 and Ondansetron, Dexamethasone, Treatment may vary due to age or medical condition and Midazolam  Airway Management Planned: Oral ETT  Additional Equipment:   Intra-op Plan:   Post-operative Plan: Extubation in OR  Informed Consent: I have reviewed the patients History and Physical, chart, labs and discussed the procedure including the risks, benefits and alternatives for the proposed anesthesia with the patient or authorized representative who has indicated his/her understanding and acceptance.   Dental advisory given  Plan Discussed with:  CRNA  Anesthesia Plan Comments:        Anesthesia Quick Evaluation

## 2018-06-05 NOTE — Interval H&P Note (Signed)
History and Physical Interval Note:  06/05/2018 2:51 PM  Carrie Reynolds  has presented today for surgery, with the diagnosis of GALLSTONES  The various methods of treatment have been discussed with the patient and family. After consideration of risks, benefits and other options for treatment, the patient has consented to  Procedure(s): LAPAROSCOPIC CHOLECYSTECTOMY WITH POSSIBLE INTRAOPERATIVE CHOLANGIOGRAM (N/A) as a surgical intervention .  The patient's history has been reviewed, patient examined, no change in status, stable for surgery.  I have reviewed the patient's chart and labs.  Questions were answered to the patient's satisfaction.     [place]

## 2018-06-05 NOTE — Op Note (Addendum)
Laparoscopic Cholecystectomy  Procedure Note  Indications: This patient presents with symptomatic gallbladder disease and will undergo laparoscopic cholecystectomy.The procedure has been discussed with the patient. Operative and non operative treatments have been discussed. Risks of surgery include bleeding, infection,  Common bile duct injury,  Injury to the stomach,liver, colon,small intestine, abdominal wall,  Diaphragm,  Major blood vessels,  And the need for an open procedure.  Other risks include worsening of medical problems, death,  DVT and pulmonary embolism, and cardiovascular events.   Medical options have also been discussed. The patient has been informed of long term expectations of surgery and non surgical options,  The patient agrees to proceed.    Pre-operative Diagnosis: Calculus of gallbladder without mention of cholecystitis or obstruction  Post-operative Diagnosis: Same  Surgeon: Joyice Faster Bradlee Heitman   Assistants: OR staff  Anesthesia: General endotracheal anesthesia and Local anesthesia 0.25.% bupivacaine, with epinephrine  ASA Class: 2  Procedure Details  The patient was seen again in the Holding Room. The risks, benefits, complications, treatment options, and expected outcomes were discussed with the patient. The possibilities of reaction to medication, pulmonary aspiration, perforation of viscus, bleeding, recurrent infection, finding a normal gallbladder, the need for additional procedures, failure to diagnose a condition, the possible need to convert to an open procedure, and creating a complication requiring transfusion or operation were discussed with the patient. The patient and/or family concurred with the proposed plan, giving informed consent. The site of surgery properly noted/marked. The patient was taken to Operating Room, identified as Carrie Reynolds and the procedure verified as Laparoscopic Cholecystectomy with Intraoperative Cholangiograms. A Time Out was held  and the above information confirmed.  Prior to the induction of general anesthesia, antibiotic prophylaxis was administered. General endotracheal anesthesia was then administered and tolerated well. After the induction, the abdomen was prepped in the usual sterile fashion. The patient was positioned in the supine position with the left arm comfortably tucked, along with some reverse Trendelenburg.  Local anesthetic agent was injected into the skin near the umbilicus and an incision made. The midline fascia was incised and the Hasson technique was used to introduce a 12 mm port under direct vision. It was secured with a figure of eight Vicryl suture placed in the usual fashion. Pneumoperitoneum was then created with CO2 and tolerated well without any adverse changes in the patient's vital signs. Additional trocars were introduced under direct vision with an 11 mm trocar in the epigastrium and 2 5 mm trocars in the right upper quadrant. All skin incisions were infiltrated with a local anesthetic agent before making the incision and placing the trocars.   The patient had a previous gastric bypass and this appeared intact.   The gallbladder was identified, the fundus grasped and retracted cephalad. Adhesions were lysed bluntly and with the electrocautery where indicated, taking care not to injure any adjacent organs or viscus. The infundibulum was grasped and retracted laterally, exposing the peritoneum overlying the triangle of Calot. This was then divided and exposed in a blunt fashion. The cystic duct was clearly identified and bluntly dissected circumferentially. The junctions of the gallbladder, cystic duct and common bile duct were clearly identified prior to the division of any linear structure.   The critical view was obtained.  The cystic duct was very small and would not accommodate a catheter.    The cystic duct was then  ligated with surgical clips  on the patient side and  clipped on the  gallbladder side and divided.  The cystic artery was identified, dissected free, ligated with clips and divided as well. Posterior cystic artery clipped and divided.  The gallbladder was dissected from the liver bed in retrograde fashion with the electrocautery. The gallbladder was removed. The liver bed was irrigated and inspected. Hemostasis was achieved with the electrocautery. Copious irrigation was utilized and was repeatedly aspirated until clear all particulate matter. Hemostasis was achieved with no signs of bleeding or bile leakage.  Pneumoperitoneum was completely reduced after viewing removal of the trocars under direct vision. The wound was thoroughly irrigated and the fascia was then closed with a figure of eight suture; the skin was then closed with 4 O monocryl  and a sterile dressing of Dermabond  was applied.  Instrument, sponge, and needle counts were correct at closure and at the conclusion of the case.   Findings: Cholelithiasis  Estimated Blood Loss: Minimal         Drains: none         Total IV Fluids: per record          Specimens: Gallbladder           Complications: None; patient tolerated the procedure well.         Disposition: PACU - hemodynamically stable.         Condition: stable

## 2018-06-05 NOTE — Progress Notes (Signed)
Dr. Daiva Huge notified of patient's HR in 40's (monitor)/66 (manual).  Other VS WNL, No new orders received.  Will continue to monitor patient.

## 2018-06-05 NOTE — Anesthesia Procedure Notes (Signed)
Procedure Name: Intubation Date/Time: 06/05/2018 3:28 PM Performed by: Julieta Bellini, CRNA Pre-anesthesia Checklist: Patient identified, Emergency Drugs available, Suction available and Patient being monitored Patient Re-evaluated:Patient Re-evaluated prior to induction Oxygen Delivery Method: Circle system utilized Preoxygenation: Pre-oxygenation with 100% oxygen Induction Type: IV induction Ventilation: Mask ventilation without difficulty Laryngoscope Size: Mac and 4 Grade View: Grade I Tube type: Oral Tube size: 7.0 mm Number of attempts: 1 Airway Equipment and Method: Stylet Placement Confirmation: ETT inserted through vocal cords under direct vision,  positive ETCO2 and breath sounds checked- equal and bilateral Secured at: 21 cm Tube secured with: Tape Dental Injury: Teeth and Oropharynx as per pre-operative assessment

## 2018-06-06 ENCOUNTER — Encounter (HOSPITAL_COMMUNITY): Payer: Self-pay | Admitting: Surgery

## 2018-07-29 DIAGNOSIS — G43109 Migraine with aura, not intractable, without status migrainosus: Secondary | ICD-10-CM | POA: Insufficient documentation

## 2018-07-29 DIAGNOSIS — H9313 Tinnitus, bilateral: Secondary | ICD-10-CM | POA: Insufficient documentation

## 2018-11-24 ENCOUNTER — Encounter: Payer: Self-pay | Admitting: Orthopedic Surgery

## 2018-11-24 ENCOUNTER — Ambulatory Visit (INDEPENDENT_AMBULATORY_CARE_PROVIDER_SITE_OTHER): Payer: Self-pay

## 2018-11-24 ENCOUNTER — Ambulatory Visit (INDEPENDENT_AMBULATORY_CARE_PROVIDER_SITE_OTHER): Payer: Self-pay | Admitting: Orthopedic Surgery

## 2018-11-24 ENCOUNTER — Other Ambulatory Visit: Payer: Self-pay

## 2018-11-24 DIAGNOSIS — M545 Low back pain, unspecified: Secondary | ICD-10-CM

## 2018-11-24 NOTE — Progress Notes (Signed)
Office Visit Note   Patient: [facility] Reynolds           Date of Birth: [birth year]           MRN: [id number] Visit Date: 11/24/2018 Requested by: Leonard Downing, MD [place], [place] [address] PCP: Leonard Downing, MD  Subjective: Chief Complaint  Patient presents with  . Lower Back - Pain    HPI: [facility] is a patient with back pain and radicular leg pain.  This is been going on for about a year.  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  She was unconscious.  She is had some vertigo and falls since that time and that has aggravated her back.  She feels like it is a nerve pinch type sensation.  She describes that this pain will wake her up.  It will radiate down into the anterior aspect of both legs below the knees.  She did lose her job secondary to this balance issue.  Takes ibuprofen but she cannot take too much of that because of a history of gastric bypass.  This does affect her on a daily basis.              ROS: All systems reviewed are negative as they relate to the chief complaint within the history of present illness.  Patient denies  fevers or chills.   Assessment & Plan: Visit Diagnoses:  1. Low back pain, unspecified back pain laterality, unspecified chronicity, unspecified whether sciatica present     Plan: Impression is low back pain with radiating leg pain both sides with no real weakness no nerve root tension signs.  I think she does have some facet arthritis and may have a central disc in her back.  Plan at this time is MRI scan of the lumbar spine to evaluate bilateral radiculopathy.  Could be foraminal stenosis or central disc.  I think she would likely do well with epidural steroid injections.  I will see her back after that scan and we can decide what to do from there.  Follow-Up Instructions: Return for after MRI.   Orders:  Orders Placed This  Encounter  Procedures  . XR Lumbar Spine 2-3 Views  . MR Lumbar Spine w/o contrast   No orders of the defined types were placed in this encounter.     Procedures: No procedures performed   Clinical Data: No additional findings.  Objective: Vital Signs: There were no vitals taken for this visit.  Physical Exam:   Constitutional: Patient appears well-developed HEENT:  Head: Normocephalic Eyes:EOM are normal Neck: Normal range of motion Cardiovascular: Normal rate Pulmonary/chest: Effort normal Neurologic: Patient is alert Skin: Skin is warm Psychiatric: Patient has normal mood and affect    Ortho Exam: Ortho exam demonstrates full active and passive range of motion of the knees ankles and hips.  She has no nerve root tension signs.  Does have mild paresthesias L5 distribution bilaterally.  Pedal pulses palpable.  Reflexes symmetric 0 1+ out of 4 bilateral patella and Achilles with negative Babinski.  Negative clonus.  Does have some point tenderness around the thoracolumbar junction.  Some pain with forward lateral bending.  Specialty Comments:  No specialty comments available.  Imaging: Xr Lumbar Spine 2-3 Views  Result Date: 11/24/2018 AP lateral lumbar spine reviewed.  Patient has no significant degenerative joint disease of the hips.  SI joints appear well-maintained.  There is some facet arthritis noted in the lower lumbar spine.  Some calcification of the aorta also present.  No spondylolisthesis or compression fracture seen.    PMFS History: Patient Active Problem List   Diagnosis Date Noted  . Rapid palpitations 04/16/2017  . Systolic murmur 35/59/7416  . Near syncope 04/16/2017  . OSA on CPAP 11/19/2013  . Obesity hypoventilation syndrome ([place]) 11/19/2013  . Sleep apnea 04/25/2012  . Obesity 04/25/2012  . Depression 04/25/2012  . Environmental allergies 04/25/2012  . Hypertension 04/25/2012  . Hypercholesteremia 04/25/2012  . Postmenopausal 09/18/2011   . LEG EDEMA 03/16/2010   Past Medical History:  Diagnosis Date  . Anxiety   . Arnold-Chiari malformation ([place]) 2006  . Cardiomyopathy ([place])   . Cervical strain    syndrome  . Cervicogenic headache   . Concussion with loss of consciousness 10/09/2017  . CTS (carpal tunnel syndrome)   . Depression   . GERD (gastroesophageal reflux disease)   . Glucose intolerance (impaired glucose tolerance)   . History of kidney stones   . Hypertension   . Migraine headache    with visual aura  . Obesity   . OSA (obstructive sleep apnea)    not treated  . Postmenopausal   . Vertigo    post concussive    Family History  Problem Relation Age of Onset  . Hypertension Mother   . Cancer Father        lung  . Alzheimer's disease Father   . Migraines Father   . Diabetes Mellitus II Sister   . Hyperlipidemia Sister   . Hypertension Sister   . Alzheimer's disease Paternal Grandmother   . Cancer Paternal Grandfather        lung    Past Surgical History:  Procedure Laterality Date  . ABDOMINAL HYSTERECTOMY  2003   partial- L ovary remains  . ABDOMINAL HYSTERECTOMY    . BUNIONECTOMY     right great toe  . CHOLECYSTECTOMY N/A 06/05/2018   Procedure: LAPAROSCOPIC CHOLECYSTECTOMY WITH POSSIBLE INTRAOPERATIVE CHOLANGIOGRAM;  Surgeon: Erroll Luna, MD;  Location: [facility];  Service: General;  Laterality: N/A;  . GASTRIC BYPASS  11/2015   [facility]  . HAND SURGERY    . TUBAL LIGATION Bilateral    Social History   Occupational History  . Occupation: Retired Best boy: [employer]    Comment: Logistics, warehouse  Tobacco Use  . Smoking status: Former Smoker    Packs/day: 0.10    Years: 30.00    Pack years: 3.00    Types: Cigarettes    Last attempt to quit: 04/26/2012    Years since quitting: 6.5  . Smokeless tobacco: Never Used  Substance and Sexual Activity  . Alcohol use: Yes    Comment: occasional  . Drug use: No  . Sexual activity: Yes    Partners: Male     Birth control/protection: Surgical

## 2018-11-27 ENCOUNTER — Encounter: Payer: Self-pay | Admitting: Orthopedic Surgery

## 2018-11-28 NOTE — Telephone Encounter (Signed)
I reviewed the lumbar spine MRI and compared to the plain x-rays and I do not really see anything too different.  The hip arthritis which was mild on the CT scan also appears unchanged on plain radiographs.  So I do not really see any new acute fracture or damage from the multiple falls.

## 2019-01-09 ENCOUNTER — Telehealth: Payer: Self-pay | Admitting: *Deleted

## 2019-01-09 NOTE — Telephone Encounter (Signed)
Pt called asking if she should be tested for covid-19. She nor her family is having symptoms. She stated that a family friend had come over to do some plumbing and had had lunch with him. He is stating that he had gone to his provider and had blood work that showed he was positive for covid-19. Asked if they were checking for antibodies, she did not.  Advised her that she would have to have a visit with her provider and they would schedule a test for her if needed  Advised her to speak with her provider on Monday unless she starts having symptoms. She voiced understanding.

## 2019-01-22 ENCOUNTER — Other Ambulatory Visit: Payer: Self-pay | Admitting: Family

## 2019-01-22 DIAGNOSIS — Z1231 Encounter for screening mammogram for malignant neoplasm of breast: Secondary | ICD-10-CM

## 2019-03-24 NOTE — Telephone Encounter (Signed)
My Chart Conversation:  I'd be happy to review the results for you.  My schedule has been relatively busy of late, but you are more than welcome to call to see if you can schedule an appointment.  I will forward your note to my nurse so that she can talk with the scheduler about you calling.  [name], MD ===View-only below this line===   ----- Message -----    From: [facility]    Sent: 03/24/2019  4:21 PM EDT      To: [name], MD Subject: Non-Urgent Medical Question  Good Afternoon Dr. Ellyn Hack,  I have recently started HRT and during some testing received so scary results that I would like your review on.  I am requesting an appointment with you and wanted to forward the results for you review.  Your time is greatly appreciated!!  [facility] Reynolds [phone]

## 2019-04-16 ENCOUNTER — Encounter: Payer: Self-pay | Admitting: Cardiology

## 2019-04-16 ENCOUNTER — Telehealth (INDEPENDENT_AMBULATORY_CARE_PROVIDER_SITE_OTHER): Payer: Self-pay | Admitting: Cardiology

## 2019-04-16 VITALS — Ht 67.0 in | Wt 198.0 lb

## 2019-04-16 DIAGNOSIS — R002 Palpitations: Secondary | ICD-10-CM

## 2019-04-16 DIAGNOSIS — Z9989 Dependence on other enabling machines and devices: Secondary | ICD-10-CM

## 2019-04-16 DIAGNOSIS — E669 Obesity, unspecified: Secondary | ICD-10-CM

## 2019-04-16 DIAGNOSIS — R55 Syncope and collapse: Secondary | ICD-10-CM

## 2019-04-16 DIAGNOSIS — R931 Abnormal findings on diagnostic imaging of heart and coronary circulation: Secondary | ICD-10-CM

## 2019-04-16 DIAGNOSIS — I10 Essential (primary) hypertension: Secondary | ICD-10-CM

## 2019-04-16 DIAGNOSIS — E785 Hyperlipidemia, unspecified: Secondary | ICD-10-CM | POA: Insufficient documentation

## 2019-04-16 DIAGNOSIS — G4733 Obstructive sleep apnea (adult) (pediatric): Secondary | ICD-10-CM

## 2019-04-16 DIAGNOSIS — E7849 Other hyperlipidemia: Secondary | ICD-10-CM

## 2019-04-16 MED ORDER — METOPROLOL TARTRATE 50 MG PO TABS: 100.0000 mg | ORAL_TABLET | Freq: Once | ORAL | 0 refills | Status: DC

## 2019-04-16 MED ORDER — ASPIRIN EC 81 MG PO TBEC: 81.0000 mg | DELAYED_RELEASE_TABLET | Freq: Every day | ORAL | 3 refills | Status: DC

## 2019-04-16 MED ORDER — ROSUVASTATIN CALCIUM 20 MG PO TABS: 20.0000 mg | ORAL_TABLET | Freq: Every day | ORAL | 5 refills | Status: DC

## 2019-04-16 NOTE — Patient Instructions (Addendum)
Medication Instructions:    Start rosuvastatin 20 mg PO daily (Disp #90 mg, 4 refills)  Start Aspirin 81 mg daily  If you need a refill on your cardiac medications before your next appointment, please call your pharmacy.   Lab work:   Will check Fasting Lipid panel in ~3 months (so after f/u visit)  If you have labs (blood work) drawn today and your tests are completely normal, you will receive your results only by: [name] MyChart Message (if you have MyChart) OR . A paper copy in the mail If you have any lab test that is abnormal or we need to change your treatment, we will call you to review the results.  Testing/Procedures: Will be schedule at [facility] Cardiac CT Angiogram (with CT FFR)- Your physician has requested that you have cardiac CT. Cardiac computed tomography (CT) is a painless test that uses an x-ray machine to take clear, detailed pictures of your heart. For further information please visit [email or web address]. Please follow instruction sheet as given.     Follow-Up: At [facility], you and your health needs are our priority.  As part of our continuing mission to provide you with exceptional heart care, we have created designated Provider Care Teams.  These Care Teams include your primary Cardiologist (physician) and Advanced Practice Providers (APPs -  Physician Assistants and Nurse Practitioners) who all work together to provide you with the care you need, when you need it. . You will need a follow up appointment in     1-2      months.  Please call our office 2 months in advance to schedule this appointment.  You may see [name], MD or one of the following Advanced Practice Providers on your designated Care Team:   . Rosaria Ferries, PA-C . [name], DNP, ANP  Any Other Special Instructions Will Be Listed Below (If Applicable).     Your cardiac CT will be scheduled at  the below  locations:   [facility] [address] [place], [place] [address] [phone]    If scheduled at [facility], please arrive at the [facility] main entrance of [facility] 30-45 minutes prior to test start time. Proceed to the [facility] Radiology Department (first floor) to check-in and test prep.   Please follow these instructions carefully (unless otherwise directed):  Please have lab work completed one week prior to test- BMP   On the Night Before the Test: . Be sure to Drink plenty of water. . Do not consume any caffeinated/decaffeinated beverages or chocolate 12 hours prior to your test. . Do not take any antihistamines 12 hours prior to your test.  On the Day of the Test: . Drink plenty of water. Do not drink any water within one hour of the test. . Do not eat any food 4 hours prior to the test. . You may take your regular medications prior to the test.  . Take metoprolol (Lopressor)  100 mg two hours prior to test. . HOLD Furosemide/Hydrochlorothiazide morning of the test. . FEMALES- please wear underwire-free bra if available        After the Test: . Drink plenty of water. . After receiving IV contrast, you may experience a mild flushed feeling. This is normal. . On occasion, you may experience a mild rash up to 24 hours after the test. This is not dangerous. If this occurs, you can take Benadryl  25 mg and increase your fluid intake. . If you experience trouble breathing, this can be serious. If it is severe call 911 IMMEDIATELY. If it is mild, please call our office. . If you take any of this medication:Metformin, please do not take 48 hours after completing test unless otherwise instructed.    Please contact the cardiac imaging nurse navigator should you have any questions/concerns Carrie Bond, RN Navigator Cardiac [facility] [phone] Office  [phone] Cell

## 2019-04-16 NOTE — Progress Notes (Signed)
Virtual Visit via Video Note   This visit type was conducted due to national recommendations for restrictions regarding the COVID-19 Pandemic (e.g. social distancing) in an effort to limit this patient's exposure and mitigate transmission in our community.  Due to her co-morbid illnesses, this patient is at least at moderate risk for complications without adequate follow up.  This format is felt to be most appropriate for this patient at this time.  All issues noted in this document were discussed and addressed.  A limited physical exam was performed with this format.  Please refer to the patient's chart for her consent to telehealth for [facility].   Patient has given verbal permission to conduct this visit via virtual appointment and to bill insurance 04/17/2019 2:53 PM     Evaluation Performed:  Follow-up visit  Date:  04/17/2019   ID:  Carrie Reynolds, DOB [birth year], MRN [id number]  Patient Location: Home Provider Location: Home  PCP:  Leonard Downing, MD  Cardiologist:  [name], MD  Electrophysiologist:  None   Chief Complaint:  Delayed f/u to discuss results of coronary calcium score  History of Present Illness:    Carrie Reynolds is a 63 y.o. female with PMH notable for h/o Syncope & Near syncope (negative w/u to date), and status post gastric bypass surgery for significant obesity who presents via audio/video conferencing for a telehealth visit today to discuss results of her coronary calcium score.  Carrie Reynolds was last seen by Rosaria Ferries, PA last fall for Pre-op evaluation.   (I last saw her 2 years ago November 2018 for evaluation of an echocardiogram and near syncope.  Interval History:  Carrie Reynolds is really doing well from a cardiac standpoint.  She is being referred back to evaluate her coronary calcium score which was quite elevated at 1254.  I also reviewed her lipid panel which showed total cholesterol of 234, triglycerides 255, LDL 136  and HDL 53.  LDL particle number was [id number].  Overall however from a cardiac standpoint she is doing well.  No further syncopal or near syncopal type episodes.  She may have rare palpitations but not that much.  She exercises regularly.  She swims laps in the pool and walks on the treadmill just about every day either 1 of the 2.  She also then rides her bike several days a week in the neighborhood.  On the treadmill she will walk anywhere up to 1-1/2 to 2 miles a day without difficulty.  She is hoping undergo evaluation to have excess skin removed from the weight loss after her gastric surgery & needed cardiology evaluation prior to that appointment in light of her CT T scan results.  Cardiovascular ROS: no chest pain or dyspnea on exertion positive for - occasional skipped beats negative for - edema, orthopnea, palpitations, paroxysmal nocturnal dyspnea, rapid heart rate, shortness of breath or syncope/ near syncope; TIA/amaurosis fugax, claudication  The patient does not have symptoms concerning for COVID-19 infection (fever, chills, cough, or new shortness of breath).  The patient is practicing social distancing.  ROS:  Please see the history of present illness.    Review of Systems  Constitutional: Negative for malaise/fatigue and weight loss (Has maintained her weight after gaining some back from the COVID-19 not down).  HENT: Negative for congestion and nosebleeds.   Respiratory: Negative for cough, shortness of breath and wheezing.   Cardiovascular: Negative for leg swelling.  Gastrointestinal: Negative for abdominal pain, blood in stool,  constipation, diarrhea, heartburn and melena.  Genitourinary: Negative for hematuria.  Musculoskeletal: Negative for joint pain and myalgias.  Neurological: Positive for weakness (She had one episode last week while out gardening that she just felt generally weak and had to come inside and take a rest.  Probably overdid it in the heat.). Negative for  dizziness and headaches.  Psychiatric/Behavioral: Negative.     Past Medical History:  Diagnosis Date  . Anxiety   . Arnold-Chiari malformation ([place]) 2006  . Cardiomyopathy ([place])   . Cervical strain    syndrome  . Cervicogenic headache   . Concussion with loss of consciousness 10/09/2017  . CTS (carpal tunnel syndrome)   . Depression   . GERD (gastroesophageal reflux disease)   . Glucose intolerance (impaired glucose tolerance)   . History of kidney stones   . Hypertension   . Migraine headache    with visual aura  . Obesity   . OSA (obstructive sleep apnea)    not treated  . Postmenopausal   . Vertigo    post concussive   Past Surgical History:  Procedure Laterality Date  . ABDOMINAL HYSTERECTOMY  2003   partial- L ovary remains  . ABDOMINAL HYSTERECTOMY    . BUNIONECTOMY     right great toe  . CHOLECYSTECTOMY N/A 06/05/2018   Procedure: LAPAROSCOPIC CHOLECYSTECTOMY WITH POSSIBLE INTRAOPERATIVE CHOLANGIOGRAM;  Surgeon: Erroll Luna, MD;  Location: [place];  Service: General;  Laterality: N/A;  . GASTRIC BYPASS  11/2015   [facility]  . HAND SURGERY    . TUBAL LIGATION Bilateral      Current Meds  Medication Sig  . Coenzyme Q10 (CO Q10) 200 MG CAPS Take 1 capsule by mouth daily. TAKE 1 CAPSULE DAILY  . DHEA 25 MG CAPS Take 1 capsule by mouth daily.  [name] estradiol (ESTRACE) 2 MG tablet Take 2 mg by mouth daily.  [name] ibuprofen (ADVIL,MOTRIN) 800 MG tablet Take 1 tablet (800 mg total) by mouth every 8 (eight) hours as needed.  [name] losartan-hydrochlorothiazide (HYZAAR) 100-12.5 MG tablet Take 1 tablet by mouth at bedtime.  . Magnesium 500 MG TABS Take 1,000 mg by mouth daily. TAKE 2 TABLETS DAILY  . metFORMIN (GLUCOPHAGE) 500 MG tablet Take 500 mg by mouth 2 (two) times daily.  . pantoprazole (PROTONIX) 40 MG tablet Take 40 mg by mouth at bedtime.  . progesterone (PROMETRIUM) 200 MG capsule Take 200 mg by mouth at bedtime.  . sertraline (ZOLOFT) 50 MG tablet Take 100 mg by mouth  at bedtime.     Allergies:   Ceclor [cefaclor], Tetracyclines & related, Amoxicillin, Ampicillin, Ceclor [cefaclor], Lipitor [atorvastatin calcium], Penicillins, Tetracyclines & related, Amoxicillin, Ampicillin, Atorvastatin, Lisinopril, Simvastatin, and Tetracycline   Social History   Tobacco Use  . Smoking status: Former Smoker    Packs/day: 0.10    Years: 30.00    Pack years: 3.00    Types: Cigarettes    Quit date: 04/26/2012    Years since quitting: 6.9  . Smokeless tobacco: Never Used  Substance Use Topics  . Alcohol use: Yes    Comment: occasional  . Drug use: No     Family Hx: The patient's family history includes Alzheimer's disease in her father and paternal grandmother; Cancer in her father and paternal grandfather; Diabetes Mellitus II in her sister; Hyperlipidemia in her sister; Hypertension in her mother and sister; Migraines in her father.   Prior CV studies:   The following studies were reviewed today: . Echo 03/2018: . Coronary  Calcium Score: 1254.6 (99th per  Labs/Other Tests and Data Reviewed:    EKG:  No ECG reviewed.  Recent Labs: 01/14/2019  Na+ 141, K+ 4.0, Cl- 104, HCO3- 22 , BUN 20, Cr 0.91, Glu 96, Ca2+ 9.6; AST 13, ALT 13, AlkP 84, TP 6.8,Alb 4.6  CBC: W 4.8, H/H 14.1/43.8, Plt 208  A1c 5.5, TSH 2.94   Recent Lipid Panel 01/14/2019  TC 234, TG 80.5, HDL 33, LDL 136 --> LDL-P2 2056* (Very High >2000, goal <1000) , HDL-P 35.4 (goal> 30.5).  Small LDL-P9 51 (high.  Goal<527).  LDL size 20.9nm (goal >20.5).  LP-IR score 74 (high, goal<45)  Wt Readings from Last 3 Encounters:  04/16/19 198 lb (89.8 kg)  06/05/18 196 lb (88.9 kg)  06/03/18 196 lb (88.9 kg)   -- stable wgt now;  Is looking to have skin removed post GOP (but needs Pre-op eval).   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  Interestingly, she does not have any cardiac symptoms to speak of despite her significant exercise level with swimming, riding a bike etc.  It does however go along with her extremely elevated cholesterol levels with LDL-P of greater than 2000.  We need to determine if there is occlusive CAD or just simply extensive calcification.  Also needed to simply start risk factor modification.  Plan:   Start rosuvastatin 20 mg daily.    Start aspirin 81 mg daily.  Look for Ischemic CAD with Coronary CT Angiogram-CT FFR.  This allows [place] to have both an anatomic and physiologic evaluation.  We will need to get a better assessment of her blood pressure --> will need to target blood pressure less than 135/80.  Would probably target initiation with either calcium channel blocker or beta-blocker depending on heart rate.       Relevant Medications   aspirin EC 81 MG tablet   rosuvastatin (CRESTOR) 20 MG tablet   Other Relevant Orders   Basic metabolic panel   CT CORONARY MORPH W/CTA COR W/SCORE W/CA W/CM &/OR WO/CM   CT CORONARY FRACTIONAL FLOW RESERVE DATA PREP   CT CORONARY FRACTIONAL FLOW RESERVE FLUID ANALYSIS   Hyperlipidemia due to dietary fat intake (Chronic)    Extensive lab results reviewed.  At this point I think we need to initiate treatment.  We will start with Crestor 20 mg.  She is had issues with statins in the past.  Would probably reevaluate lipids in roughly 3 months -> need to start target LDL closer to 70 based on a high level of coronary calcium.  Also consider co-Q10.      Relevant Medications   aspirin EC 81 MG tablet   rosuvastatin (CRESTOR) 20 MG tablet   Other Relevant Orders   Lipid panel    Obesity (BMI 30.0-34.9) (Chronic)    She has done a great job losing weight after gastric reduction surgery, but has started up on a little weight over the COVID-19 quarantine timeframe.  I  think the plan is for her to have a second surgery to remove the excess skin.  We will need to await the results of the coronary CTA prior to her moving forward with preop evaluation.      Hypertension (Chronic)    She is on an ARB, but not on beta-blocker because of history of bradycardia.  May need to consider reattempt at beta-blocker versus amlodipine if blood pressure continue to be elevated.      Relevant Medications   aspirin EC 81 MG tablet   rosuvastatin (CRESTOR) 20 MG tablet   OSA on CPAP (Chronic)   Rapid palpitations (Chronic)    Not really having any more of these episodes.  Noticing more just skipped beats but nothing prolonged. Because of bradycardia we have not had her on a beta-blocker.      Relevant Orders   Basic metabolic panel   CT CORONARY MORPH W/CTA COR W/SCORE W/CA W/CM &/OR WO/CM   CT CORONARY FRACTIONAL FLOW RESERVE DATA PREP   CT CORONARY FRACTIONAL FLOW RESERVE FLUID ANALYSIS   Near syncope   Relevant Medications   aspirin EC 81 MG tablet   rosuvastatin (CRESTOR) 20 MG tablet   Other Relevant Orders   Basic metabolic panel   CT CORONARY MORPH W/CTA COR W/SCORE W/CA W/CM &/OR WO/CM   CT CORONARY FRACTIONAL FLOW RESERVE DATA PREP   CT CORONARY FRACTIONAL FLOW RESERVE FLUID ANALYSIS      COVID-19 Education: The signs and symptoms of COVID-19 were discussed with the patient and how to seek care for testing (follow up with PCP or arrange E-visit).   The importance of social distancing was discussed today.  Time:   Today, I have spent 35 minutes with the patient with telehealth technology discussing the above problems.  Additional ~10 in reviewing chart - outside study results.   Patient Instructions  Medication Instructions:    Start rosuvastatin 20 mg PO  daily (Disp #90 mg, 4 refills)  Start Aspirin 81 mg daily  If you need a refill on your cardiac medications before your next appointment, please call your pharmacy.   Lab work:   Will check Fasting Lipid panel in ~3 months (so after f/u visit)  If you have labs (blood work) drawn today and your tests are completely normal, you will receive your results only by: [name] MyChart Message (if you have MyChart) OR . A paper copy in the mail If you have any lab test that is abnormal or we need to change your treatment, we will call you to review the results.  Testing/Procedures: Will be schedule at [place] Cardiac CT Angiogram (with CT FFR)- Your physician has requested that you have cardiac CT. Cardiac computed tomography (CT) is a painless test that uses an x-ray machine to take clear, detailed pictures of your heart. For further information please visit HugeFiesta.[email or web address]. Please follow instruction sheet as given.     Follow-Up: At [facility], you and your health needs are our priority.  As part of our continuing mission to provide you with exceptional heart care, we have created designated Provider Care Teams.  These Care Teams include your primary Cardiologist (physician) and Advanced Practice Providers (APPs -  Physician Assistants and Nurse Practitioners) who all work together to provide you with the care you need, when you need it. . You will need a follow up appointment in     1-2      months.  Please call our office 2 months in advance to schedule this appointment.  You may see [name], MD or one of the following Advanced Practice Providers on your designated Care Team:   . Rosaria Ferries, PA-C . [name], DNP, ANP  Any Other Special Instructions Will Be Listed Below (If Applicable).     Your cardiac CT will be scheduled at  the below locations:   [facility] [address] [place], [place] [address] [phone]    If scheduled at [facility], please arrive at the [facility] main entrance of [facility] 30-45 minutes prior to test start time. Proceed to the [facility] Radiology Department (first floor) to check-in and test prep.   Please follow these instructions carefully (unless otherwise directed):  Please have lab work completed one week prior to test- BMP   On the Night Before the Test: . Be sure to Drink plenty of water. . Do not consume any caffeinated/decaffeinated beverages or chocolate 12 hours prior to your test. . Do not take any antihistamines 12 hours prior to your test.  On the Day of the Test: . Drink plenty of water. Do not drink any water within one hour of the test. . Do not eat any food 4 hours prior to the test. . You may take your regular medications prior to the test.  . Take metoprolol (Lopressor)  100 mg two hours prior to test. . HOLD Furosemide/Hydrochlorothiazide morning of the test. . FEMALES- please wear underwire-free bra if available        After the Test: . Drink plenty of water. . After receiving IV contrast, you may experience a mild flushed feeling. This is normal. . On occasion, you may experience a mild rash up to 24 hours after the test. This is not dangerous. If this occurs, you can take Benadryl 25 mg and increase your fluid intake. . If you experience trouble breathing, this can be serious. If it is severe call 911 IMMEDIATELY. If it is mild, please call our office. . If you take any of this medication:Metformin, please do not take 48 hours after completing test unless otherwise instructed.    Please contact the cardiac imaging nurse navigator should you have any questions/concerns Marchia Bond, RN Navigator Cardiac Imaging [facility] [phone] Office  [phone] Cell          Signed, [name], MD  04/17/2019 2:53 PM    [place]

## 2019-04-17 ENCOUNTER — Encounter: Payer: Self-pay | Admitting: Cardiology

## 2019-04-17 NOTE — Assessment & Plan Note (Addendum)
Extensive lab results reviewed.  At this point I think we need to initiate treatment.  We will start with Crestor 20 mg.  She is had issues with statins in the past.  Would probably reevaluate lipids in roughly 3 months -> need to start target LDL closer to 70 based on a high level of coronary calcium.  Also consider co-Q10.

## 2019-04-17 NOTE — Assessment & Plan Note (Signed)
She is on an ARB, but not on beta-blocker because of history of bradycardia.  May need to consider reattempt at beta-blocker versus amlodipine if blood pressure continue to be elevated.

## 2019-04-17 NOTE — Assessment & Plan Note (Signed)
She has done a great job losing weight after gastric reduction surgery, but has started up on a little weight over the COVID-19 quarantine timeframe.  I think the plan is for her to have a second surgery to remove the excess skin.  We will need to await the results of the coronary CTA prior to her moving forward with preop evaluation.

## 2019-04-17 NOTE — Assessment & Plan Note (Addendum)
Quite high coronary calcium score.  Interestingly, she does not have any cardiac symptoms to speak of despite her significant exercise level with swimming, riding a bike etc.  It does however go along with her extremely elevated cholesterol levels with LDL-P of greater than 2000.  We need to determine if there is occlusive CAD or just simply extensive calcification.  Also needed to simply start risk factor modification.  Plan:   Start rosuvastatin 20 mg daily.    Start aspirin 81 mg daily.  Look for Ischemic CAD with Coronary CT Angiogram-CT FFR.  This allows [place] to have both an anatomic and physiologic evaluation.  We will need to get a better assessment of her blood pressure --> will need to target blood pressure less than 135/80.  Would probably target initiation with either calcium channel blocker or beta-blocker depending on heart rate.

## 2019-04-17 NOTE — Assessment & Plan Note (Signed)
Not really having any more of these episodes.  Noticing more just skipped beats but nothing prolonged. Because of bradycardia we have not had her on a beta-blocker.

## 2019-04-24 NOTE — Telephone Encounter (Signed)
Spoke to patient . She states she received a phone call to schedule CCTA. SHE WANTED TO KNOW HOW LONG TO RECEIVED RESULTS  OF T TEST RN INFORMED PATIENT TAKES @ WEEK OR LEAST FOR  RESULTS. SHE STATED OKAY SHE HAS A SURGERY COMING UP THE LATER PART OF OCT 2020

## 2019-04-27 ENCOUNTER — Telehealth (HOSPITAL_COMMUNITY): Payer: Self-pay | Admitting: Emergency Medicine

## 2019-04-27 NOTE — Telephone Encounter (Signed)
Reaching out to patient to offer assistance regarding upcoming cardiac imaging study; pt verbalizes understanding of appt date/time, parking situation and where to check in, pre-test NPO status and medications ordered, and verified current allergies; name and call back number provided for further questions should they arise Amadeo Coke RN Navigator Cardiac Imaging [place] [facility] [phone] office [phone] cell 

## 2019-04-29 ENCOUNTER — Other Ambulatory Visit: Payer: Self-pay

## 2019-04-29 ENCOUNTER — Ambulatory Visit
Admission: RE | Admit: 2019-04-29 | Discharge: 2019-04-29 | Disposition: A | Discharge status: Home / Self Care | Payer: Managed Care, Other (non HMO) | Source: Ambulatory Visit | Attending: Cardiology | Admitting: Cardiology

## 2019-04-29 DIAGNOSIS — R55 Syncope and collapse: Secondary | ICD-10-CM | POA: Diagnosis present

## 2019-04-29 DIAGNOSIS — R931 Abnormal findings on diagnostic imaging of heart and coronary circulation: Secondary | ICD-10-CM

## 2019-04-29 DIAGNOSIS — R002 Palpitations: Secondary | ICD-10-CM | POA: Diagnosis present

## 2019-04-29 DIAGNOSIS — I2510 Atherosclerotic heart disease of native coronary artery without angina pectoris: Secondary | ICD-10-CM

## 2019-04-29 LAB — POCT I-STAT CREATININE: Creatinine, Ser: 0.9 mg/dL (ref 0.44–1.00)

## 2019-04-29 MED ORDER — NITROGLYCERIN 0.4 MG SL SUBL
0.8000 mg | SUBLINGUAL_TABLET | Freq: Once | SUBLINGUAL | Status: AC
  Administered 2019-04-29: 0.8 mg via SUBLINGUAL

## 2019-04-29 MED ORDER — IOHEXOL 300 MG/ML  SOLN: 100.0000 mL | Freq: Once | INTRAMUSCULAR | Status: DC | PRN

## 2019-04-29 MED ORDER — IOHEXOL 350 MG/ML SOLN
75.0000 mL | Freq: Once | INTRAVENOUS | Status: AC | PRN
  Administered 2019-04-29: 75 mL via INTRAVENOUS

## 2019-04-29 NOTE — Progress Notes (Signed)
Patient ID: Carrie Reynolds, female   DOB: Jan 23, 1956, 63 y.o.   MRN: [id number]   Pt tolerated test without incident; denies dizziness or lightheadedness; pt ambulatory to lobby with steady gait

## 2019-04-30 NOTE — Telephone Encounter (Signed)
Sent mychart message  Good afternoon Ms Halls,  Dr Ellyn Hack has given his comment in regards to your test results, I was not sure if you just saw the  Official report without his interpretation. I called you this afternoon to give you an appointment to discuss with Dr Ellyn Hack the results in person, as he requested.  Th e appointment is Oct 15,2020 at 11:40 am . If you will arrive 15 to 20 min early, enough time to check in , please wear a mask and bring your medication - since Dr Ellyn Hack will be discussing the results with , if you would like to bring only one family member with you that will be fine. If you unable to make the appointment please let know .  Ivin Booty RN

## 2019-05-06 ENCOUNTER — Ambulatory Visit (INDEPENDENT_AMBULATORY_CARE_PROVIDER_SITE_OTHER): Payer: Managed Care, Other (non HMO) | Admitting: Cardiology

## 2019-05-06 ENCOUNTER — Other Ambulatory Visit: Payer: Self-pay

## 2019-05-06 VITALS — BP 130/78 | HR 59 | Temp 97.2°F | Ht 67.0 in | Wt 202.0 lb

## 2019-05-06 DIAGNOSIS — G4733 Obstructive sleep apnea (adult) (pediatric): Secondary | ICD-10-CM

## 2019-05-06 DIAGNOSIS — R002 Palpitations: Secondary | ICD-10-CM

## 2019-05-06 DIAGNOSIS — R931 Abnormal findings on diagnostic imaging of heart and coronary circulation: Secondary | ICD-10-CM | POA: Diagnosis not present

## 2019-05-06 DIAGNOSIS — Z01810 Encounter for preprocedural cardiovascular examination: Secondary | ICD-10-CM | POA: Diagnosis not present

## 2019-05-06 DIAGNOSIS — I2510 Atherosclerotic heart disease of native coronary artery without angina pectoris: Secondary | ICD-10-CM

## 2019-05-06 DIAGNOSIS — Z9989 Dependence on other enabling machines and devices: Secondary | ICD-10-CM

## 2019-05-06 DIAGNOSIS — I10 Essential (primary) hypertension: Secondary | ICD-10-CM

## 2019-05-06 DIAGNOSIS — E7849 Other hyperlipidemia: Secondary | ICD-10-CM | POA: Diagnosis not present

## 2019-05-06 HISTORY — DX: Atherosclerotic heart disease of native coronary artery without angina pectoris: I25.10

## 2019-05-06 NOTE — Patient Instructions (Addendum)
Medication Instructions:  NO CHANGES  If you need a refill on your cardiac medications before your next appointment, please call your pharmacy.   Lab work: CBC BMP If you have labs (blood work) drawn today and your tests are completely normal, you will receive your results only by: [name] MyChart Message (if you have MyChart) OR . A paper copy in the mail If you have any lab test that is abnormal or we need to change your treatment, we will call you to review the results.  Testing/Procedures: WILL BE SCHEDULE AT [place] 20 , 2020 Your physician has requested that you have a cardiac catheterization. Cardiac catheterization is used to diagnose and/or treat various heart conditions. Doctors may recommend this procedure for a number of different reasons. The most common reason is to evaluate chest pain. Chest pain can be a symptom of coronary artery disease (CAD), and cardiac catheterization can show whether plaque is narrowing or blocking your heart's arteries. This procedure is also used to evaluate the valves, as well as measure the blood flow and oxygen levels in different parts of your heart. For further information please visit HugeFiesta.tn. Please follow instruction sheet, as given.   Follow-Up: At [facility], you and your health needs are our priority.  As part of our continuing mission to provide you with exceptional heart care, we have created designated Provider Care Teams.  These Care Teams include your primary Cardiologist (physician) and Advanced Practice Providers (APPs -  Physician Assistants and Nurse Practitioners) who all work together to provide you with the care you need, when you need it. . You will need a follow up appointment in 2 WEEKS .   You may see [name], MD or one of the following Advanced Practice Providers on your designated Care Team:   . Rosaria Ferries, PA-C . [name], DNP, ANP  Any Other Special Instructions Will Be Listed Below  (If Applicable).         [facility] [place] [address] [address] [place] [address] Dept: [phone] Loc: [place]  05/06/2019  You are scheduled for a Cardiac Catheterization on TUESDAY, October 20 with Dr. [name].  1. Please arrive at the [facility] (Main Entrance A) at [facility]: [address] [place], [place] [address] at  5:30 AM  (This time is two hours before your procedure to ensure your preparation). Free valet parking service is available.   Special note: Every effort is made to have your procedure done on time. Please understand that emergencies sometimes delay scheduled procedures.  2. Diet: Do not eat solid foods after midnight.  The patient may have clear liquids until 5am upon the day of the procedure.   3. Labs: You will need to have blood drawn on  CBC BMP, TODAY  at [place], [place]  Open: 8am - 5pm (Lunch 12:30 - 1:30)   Phone: [phone]. You do not need to be fasting.    [place] 16 ,2020 AT 2:20 PM-- -PLEASE  GO TO [id number] ( [place] [address])  THEN SELF ISOLATE UNTIL  CARDIAC CATH  ON TUESDAY May 12 2019 .  4. Medication instructions in preparation for your procedure:  On the morning of your procedure, take your Aspirin 81 MG and any morning medicines NOT listed above.  You may use sips of water.  5.  Plan for one night stay--bring personal belongings. 6. Bring a current list of your medications and current insurance cards. 7. You MUST have a responsible person to drive you home. 8. Someone MUST be with you the first 24 hours after you arrive home or your discharge will be delayed. 9. Please wear clothes that are easy to get on and off and wear slip-on shoes.  Thank you for allowing [place] to care for you!   -- [place] [facility]

## 2019-05-06 NOTE — Progress Notes (Signed)
PCP: Leonard Downing, MD  Clinic Note: Chief Complaint  Patient presents with  . Follow-up    Coronary CT angiogram results  . Coronary Artery Disease    Significant disease noted on coronary CT angiogram  . Hyperlipidemia    HPI:   Carrie Reynolds is a 63 y.o. female with a PMH below who presents today to review results of Coronary CTA  Carrie Reynolds was last seen on [facility] on April 16, 2019 to discuss the results of the coronary calcium score performed by her PCP.  Coronary calcium score was over 1200-has been very high risk. --> She suggested that she was doing regular exercise including swimming treadmill etc. and was not having any symptoms of dyspnea or chest discomfort, however had noted that maybe her exercise tolerance was down some. --> Because of very high risk coronary calcium score, we sent her for coronary CT angiogram  Recent Hospitalizations: n/a  Reviewed  CV studies:   The following studies were reviewed today: (if available, images/films reviewed: From Epic Chart or Care Everywhere) . CORONARY CALCIUM SCORE & CTA-FFR (04/29/2019): Ca@+ Score = 1462--99th percentile. o CTA: , heavily calcified proximal LAD, to moderate diagonals.  Severe 70-99% proximal to mid LAD lesion (CT FFR 0.50).  Mild proximal RCA moderate PLB/PDA (50 to 69% - CT FFR 0.75 +) No significant L:Cx disease.  Interval History:   Carrie Reynolds returns here today to discuss the results of her CT scan.  Interestingly, as she has had time to come reflect from her last visit.  She admits to not being fully honest about her symptoms.  She does say that while her normal routine was to do the swimming and and and treadmill etc., that she has noted that over the last several months she has been getting more exertional dyspnea has not necessarily had tightness or chest but has not really pushed beyond the level of dyspnea.  She does however note that when she exerts to a certain point that  she feels her heart fluttering quivering all over the place.  This was a symptom not mentioned during her last visit. She denies any palpitations or irregular heartbeats at rest, but only when exerting herself.  No resting symptoms of chest tightness or dyspnea or heart failure.  Cardiovascular review of symptoms:positive for - dyspnea on exertion, irregular heartbeat, palpitations and Palpitations noted with exertion and dyspnea.  Not at rest negative for - chest pain, edema, orthopnea, paroxysmal nocturnal dyspnea, rapid heart rate, shortness of breath or Syncope/near syncope, TIA/amaurosis fugax.  Claudication  The patient does not have symptoms concerning for COVID-19 infection (fever, chills, cough, or new shortness of breath).  The patient is practicing social distancing. ++ Masking.  ++ Groceries/shopping, but with mask.   REVIEWED OF SYSTEMS   ROS: A comprehensive was performed. Review of Systems  Constitutional: Negative for malaise/fatigue (Maybe notes some exercise intolerance) and weight loss.  HENT: Negative for congestion and nosebleeds.   Respiratory: Negative for cough, shortness of breath and wheezing.   Cardiovascular: Negative for claudication.  Gastrointestinal: Positive for heartburn. Negative for abdominal pain, blood in stool, diarrhea and melena.  Genitourinary: Negative for hematuria.  Musculoskeletal: Negative for falls and joint pain.  Neurological: Negative for dizziness, weakness and headaches.       No further episodes like the one described during initial visit  Psychiatric/Behavioral: Negative for memory loss. The patient is nervous/anxious (Since hearing about the study results). The patient does not have  insomnia.   All other systems reviewed and are negative.  I have reviewed and (if needed) personally updated the patient's problem list, medications, allergies, past medical and surgical history, social and family history.   PAST MEDICAL HISTORY    Past Medical History:  Diagnosis Date  . Anxiety   . Arnold-Chiari malformation ([place]) 2006  . Cervical strain    syndrome  . Cervicogenic headache   . Concussion with loss of consciousness 10/09/2017  . CTS (carpal tunnel syndrome)   . Depression   . GERD (gastroesophageal reflux disease)   . Glucose intolerance (impaired glucose tolerance)   . High coronary artery calcium score    Score of 1460.  Coronary CTA shows high-grade proximal-mid LAD stenosis (CT FFR 0.50).  Also PDA/PLA 60 to 70% stenosis (CTO FFR 0.75)  . History of kidney stones   . Hypertension   . Migraine headache    with visual aura  . Obesity   . OSA (obstructive sleep apnea)    not treated  . Postmenopausal   . Vertigo    post concussive     PAST SURGICAL HISTORY   Past Surgical History:  Procedure Laterality Date  . ABDOMINAL HYSTERECTOMY  2003   partial- L ovary remains  . ABDOMINAL HYSTERECTOMY    . BUNIONECTOMY     right great toe  . CHOLECYSTECTOMY N/A 06/05/2018   Procedure: LAPAROSCOPIC CHOLECYSTECTOMY WITH POSSIBLE INTRAOPERATIVE CHOLANGIOGRAM;  Surgeon: Erroll Luna, MD;  Location: [facility];  Service: General;  Laterality: N/A;  . GASTRIC BYPASS  11/2015   [facility]  . HAND SURGERY    . TUBAL LIGATION Bilateral      MEDICATIONS/ALLERGIES   Current Meds  Medication Sig  . aspirin EC 81 MG tablet Take 1 tablet (81 mg total) by mouth daily.  . Coenzyme Q10 (CO Q10) 200 MG CAPS Take 1 capsule by mouth daily. TAKE 1 CAPSULE DAILY  . DHEA 25 MG CAPS Take 1 capsule by mouth daily.  [name] estradiol (ESTRACE) 2 MG tablet Take 2 mg by mouth daily.  [name] ibuprofen (ADVIL,MOTRIN) 800 MG tablet Take 1 tablet (800 mg total) by mouth every 8 (eight) hours as needed.  [name] losartan-hydrochlorothiazide (HYZAAR) 100-12.5 MG tablet Take 1 tablet by mouth at bedtime.  . Magnesium 500 MG TABS Take 1,000 mg by mouth daily. TAKE 2 TABLETS DAILY  . metFORMIN (GLUCOPHAGE) 500 MG tablet Take 500 mg by mouth 2 (two)  times daily.  . pantoprazole (PROTONIX) 40 MG tablet Take 40 mg by mouth at bedtime.  . progesterone (PROMETRIUM) 200 MG capsule Take 200 mg by mouth at bedtime.  . rosuvastatin (CRESTOR) 20 MG tablet Take 1 tablet (20 mg total) by mouth daily.  . sertraline (ZOLOFT) 50 MG tablet Take 100 mg by mouth at bedtime.    Allergies  Allergen Reactions  . Ceclor [Cefaclor] Hives and Rash  . Tetracyclines & Related Hives  . Amoxicillin Hives  . Ampicillin Hives  . Ceclor [Cefaclor] Hives  . Lipitor [Atorvastatin Calcium] Cough  . Penicillins Swelling and Other (See Comments)    Has patient had a PCN reaction causing immediate rash, facial/tongue/throat swelling, SOB or lightheadedness with hypotension: No Has patient had a PCN reaction causing severe rash involving mucus membranes or skin necrosis: No Has patient had a PCN reaction that required hospitalization: Yes Has patient had a PCN reaction occurring within the last 10 years: No If all of the above answers are "NO", then may proceed with Cephalosporin use.  [name]  Tetracyclines & Related Hives  . Amoxicillin Rash  . Ampicillin Rash  . Atorvastatin Cough  . Lisinopril Cough  . Simvastatin Cough  . Tetracycline Rash     SOCIAL HISTORY/FAMILY HISTORY   Social History   Tobacco Use  . Smoking status: Former Smoker    Packs/day: 0.10    Years: 30.00    Pack years: 3.00    Types: Cigarettes    Quit date: 04/26/2012    Years since quitting: 7.0  . Smokeless tobacco: Never Used  Substance Use Topics  . Alcohol use: Yes    Comment: occasional  . Drug use: No   Social History   Social History Narrative   ** Merged History Encounter **       Lives with husband [name]). She travels very frequently with her job and is gone most weeks out of the month. She is active and exercises when able.    family history includes Alzheimer's disease in her father and paternal grandmother; Cancer in her father and paternal grandfather; Diabetes  Mellitus II in her sister; Hyperlipidemia in her sister; Hypertension in her mother and sister; Migraines in her father.   OBJCTIVE -PE, EKG, labs   Wt Readings from Last 3 Encounters:  05/06/19 202 lb (91.6 kg)  04/16/19 198 lb (89.8 kg)  06/05/18 196 lb (88.9 kg)    Physical Exam: BP 130/78 (BP Location: Left Arm, Patient Position: Sitting, Cuff Size: Normal)   Pulse (!) 59   Temp (!) 97.2 F (36.2 C)   Ht 5\' 7"  (1.702 m)   Wt 202 lb (91.6 kg)   BMI 31.64 kg/m  Physical Exam  Constitutional: She is oriented to person, place, and time. She appears well-developed and well-nourished. No distress.  Mildly obese.  Well-groomed.  HENT:  Head: Normocephalic and atraumatic.  Eyes: Pupils are equal, round, and reactive to light. Conjunctivae and EOM are normal.  Neck: Normal range of motion. Neck supple. No hepatojugular reflux and no JVD present. Carotid bruit is not present.  Cardiovascular: Normal rate, regular rhythm, normal heart sounds and intact distal pulses.  No extrasystoles are present. PMI is not displaced. Exam reveals no gallop and no friction rub.  No murmur heard. Pulmonary/Chest: Effort normal and breath sounds normal. No respiratory distress. She has no wheezes. She has no rales.  Good air movement  Abdominal: Soft. Bowel sounds are normal. She exhibits no distension. There is no abdominal tenderness. There is no rebound.  Musculoskeletal: Normal range of motion.        General: No deformity or edema.  Neurological: She is alert and oriented to person, place, and time. No cranial nerve deficit. Coordination normal.  Skin: Skin is warm and dry. No erythema.  Psychiatric: Her behavior is normal. Judgment and thought content normal.  Very pleasant mood and affect.  Vitals reviewed.   Adult ECG Report  Rate: 59 ;  Rhythm: sinus bradycardia and Cannot exclude anterior MI, age undetermined.  Otherwise normal axis, intervals and durations.;   Narrative Interpretation:  Stable EKG  Recent Labs:    TC 234, TG 80.5, HDL 33, LDL 136 --> LDL-P2 2056* (Very High >2000, goal <1000) , HDL-P 35.4 (goal> 30.5).  Small LDL-P9 51 (high.  Goal<527).  LDL size 20.9nm (goal >20.5).  LP-IR score 74 (high, goal<45)   ASSESSMENT/PLAN    Problem List Items Addressed This Visit    Agatston coronary artery calcium score greater than 400 - Primary (Chronic)   Relevant Orders  EKG 12-Lead   LEFT HEART CATHETERIZATION WITH CORONARY ANGIOGRAM   Basic metabolic panel (Completed)   CBC (Completed)   Hypertension (Chronic)   Relevant Orders   EKG 12-Lead   LEFT HEART CATHETERIZATION WITH CORONARY ANGIOGRAM   Basic metabolic panel (Completed)   CBC (Completed)    Other Visit Diagnoses    Preop cardiovascular exam       Relevant Orders   EKG [id number]   Basic metabolic panel (Completed)   CBC (Completed)     Performing MD:  [name], M.D., M.S.  Procedure:  LEFT HEART CATHETERIZATION WITH CORONARY ANGIOGAPHY and possible PERCUTANEOUS CORONARY INTERVENTION.  The procedure with Risks/Benefits/Alternatives and Indications was reviewed with the patient (& husband who was on video conference call).  All questions were answered.    Risks / Complications include, but not limited to: Death, MI, CVA/TIA, VF/VT (with defibrillation), Bradycardia (need for temporary pacer placement), contrast induced nephropathy, bleeding / bruising / hematoma / pseudoaneurysm, vascular or coronary injury (with possible emergent CT or Vascular Surgery), adverse medication reactions, infection.  Additional risks involving the use of radiation with the possibility of radiation burns and cancer were explained in detail.  The patient  (and family) voice understanding and agree to proceed.     COVID-19 Education: The signs and symptoms of COVID-19 were discussed with the patient and how to seek care for testing (follow up with PCP or arrange E-visit).   The importance of social distancing was  discussed today.  I spent a total of 30 minutes with the patient and chart review. >  50% of the time was spent in direct patient consultation.  Additional time spent with chart review (studies, outside notes, etc): 15 Total Time: 45 min   Current medicines are reviewed at length with the patient today.  (+/- concerns) n/a   Patient Instructions / Medication Changes & Studies & Tests Ordered   Patient Instructions  Medication Instructions:  NO CHANGES  If you need a refill on your cardiac medications before your next appointment, please call your pharmacy.   Lab work: CBC BMP If you have labs (blood work) drawn today and your tests are completely normal, you will receive your results only by: [name] MyChart Message (if you have MyChart) OR . A paper copy in the mail If you have any lab test that is abnormal or we need to change your treatment, we will call you to review the results.  Testing/Procedures: WILL BE SCHEDULE AT [place] 20 , 2020 Your physician has requested that you have a cardiac catheterization. Cardiac catheterization is used to diagnose and/or treat various heart conditions. Doctors may recommend this procedure for a number of different reasons. The most common reason is to evaluate chest pain. Chest pain can be a symptom of coronary artery disease (CAD), and cardiac catheterization can show whether plaque is narrowing or blocking your heart's arteries. This procedure is also used to evaluate the valves, as well as measure the blood flow and oxygen levels in different parts of your heart. For further information please visit [email or web address]. Please follow instruction sheet, as given.   Follow-Up: At [facility], you and your health needs are our priority.  As part of our continuing mission to provide you with exceptional heart care, we have created designated Provider Care Teams.  These Care Teams include your primary Cardiologist (physician) and Advanced  Practice Providers (APPs -  Physician Assistants and Nurse Practitioners) who all work together to provide you  with the care you need, when you need it. . You will need a follow up appointment in 2 WEEKS .   You may see [name], MD or one of the following Advanced Practice Providers on your designated Care Team:   . Rosaria Ferries, PA-C . [name], DNP, ANP  Any Other Special Instructions Will Be Listed Below (If Applicable).         [place] [address] Dept: [phone] Loc: [place]  05/06/2019  You are scheduled for a Cardiac Catheterization on TUESDAY, October 20 with Dr. [name].  1. Please arrive at the [facility] (Main Entrance A) at [facility]: [address] [place], [place] [address] at  5:30 AM  (This time is two hours before your procedure to ensure your preparation). Free valet parking service is available.   Special note: Every effort is made to have your procedure done on time. Please understand that emergencies sometimes delay scheduled procedures.  2. Diet: Do not eat solid foods after midnight.  The patient may have clear liquids until 5am upon the day of the procedure.   3. Labs: You will need to have blood drawn on  CBC BMP, TODAY  at [place], [place]  Open: 8am - 5pm (Lunch 12:30 - 1:30)   Phone: [phone]. You do not need to be fasting.    [place] 16 ,2020 AT 2:20 PM-- -PLEASE  GO TO [id number] [place] [address])  THEN SELF ISOLATE UNTIL  CARDIAC CATH  ON TUESDAY May 12 2019 .  4. Medication instructions in preparation for your procedure:  On the morning of your procedure, take your Aspirin 81 MG and any morning medicines NOT listed above.  You may use sips of water.  5. Plan for one night stay--bring personal  belongings. 6. Bring a current list of your medications and current insurance cards. 7. You MUST have a responsible person to drive you home. 8. Someone MUST be with you the first 24 hours after you arrive home or your discharge will be delayed. 9. Please wear clothes that are easy to get on and off and wear slip-on shoes.  Thank you for allowing [place] to care for you!   -- [facility]     Studies Ordered:   Orders Placed This Encounter  Procedures  . Basic metabolic panel  . CBC  . EKG 12-Lead  . LEFT HEART CATHETERIZATION WITH CORONARY Illene Silver, M.D., M.S. Interventional Cardiologist   Pager # [phone] Phone # [phone] [address] [place], [place] [address]   Thank you for choosing Heart[facility] at [facility]!!

## 2019-05-06 NOTE — H&P (View-Only) (Signed)
PCP: Leonard Downing, MD  Clinic Note: Chief Complaint  Patient presents with  . Follow-up    Coronary CT angiogram results  . Coronary Artery Disease    Significant disease noted on coronary CT angiogram  . Hyperlipidemia    HPI:   Carrie Reynolds is a 63 y.o. female with a PMH below who presents today to review results of Coronary CTA  Carrie Reynolds was last seen on [facility] on April 16, 2019 to discuss the results of the coronary calcium score performed by her PCP.  Coronary calcium score was over 1200-has been very high risk. --> She suggested that she was doing regular exercise including swimming treadmill etc. and was not having any symptoms of dyspnea or chest discomfort, however had noted that maybe her exercise tolerance was down some. --> Because of very high risk coronary calcium score, we sent her for coronary CT angiogram  Recent Hospitalizations: n/a  Reviewed  CV studies:   The following studies were reviewed today: (if available, images/films reviewed: From Epic Chart or Care Everywhere) . CORONARY CALCIUM SCORE & CTA-FFR (04/29/2019): Ca@+ Score = 1462--99th percentile. o CTA: , heavily calcified proximal LAD, to moderate diagonals.  Severe 70-99% proximal to mid LAD lesion (CT FFR 0.50).  Mild proximal RCA moderate PLB/PDA (50 to 69% - CT FFR 0.75 +) No significant L:Cx disease.  Interval History:   Carrie Reynolds returns here today to discuss the results of her CT scan.  Interestingly, as she has had time to come reflect from her last visit.  She admits to not being fully honest about her symptoms.  She does say that while her normal routine was to do the swimming and and and treadmill etc., that she has noted that over the last several months she has been getting more exertional dyspnea has not necessarily had tightness or chest but has not really pushed beyond the level of dyspnea.  She does however note that when she exerts to a certain point that  she feels her heart fluttering quivering all over the place.  This was a symptom not mentioned during her last visit. She denies any palpitations or irregular heartbeats at rest, but only when exerting herself.  No resting symptoms of chest tightness or dyspnea or heart failure.  Cardiovascular review of symptoms:positive for - dyspnea on exertion, irregular heartbeat, palpitations and Palpitations noted with exertion and dyspnea.  Not at rest negative for - chest pain, edema, orthopnea, paroxysmal nocturnal dyspnea, rapid heart rate, shortness of breath or Syncope/near syncope, TIA/amaurosis fugax.  Claudication  The patient does not have symptoms concerning for COVID-19 infection (fever, chills, cough, or new shortness of breath).  The patient is practicing social distancing. ++ Masking.  ++ Groceries/shopping, but with mask.   REVIEWED OF SYSTEMS   ROS: A comprehensive was performed. Review of Systems  Constitutional: Negative for malaise/fatigue (Maybe notes some exercise intolerance) and weight loss.  HENT: Negative for congestion and nosebleeds.   Respiratory: Negative for cough, shortness of breath and wheezing.   Cardiovascular: Negative for claudication.  Gastrointestinal: Positive for heartburn. Negative for abdominal pain, blood in stool, diarrhea and melena.  Genitourinary: Negative for hematuria.  Musculoskeletal: Negative for falls and joint pain.  Neurological: Negative for dizziness, weakness and headaches.       No further episodes like the one described during initial visit  Psychiatric/Behavioral: Negative for memory loss. The patient is nervous/anxious (Since hearing about the study results). The patient does not have  insomnia.   All other systems reviewed and are negative.  I have reviewed and (if needed) personally updated the patient's problem list, medications, allergies, past medical and surgical history, social and family history.   PAST MEDICAL HISTORY    Past Medical History:  Diagnosis Date  . Anxiety   . Arnold-Chiari malformation ([place]) 2006  . Cervical strain    syndrome  . Cervicogenic headache   . Concussion with loss of consciousness 10/09/2017  . CTS (carpal tunnel syndrome)   . Depression   . GERD (gastroesophageal reflux disease)   . Glucose intolerance (impaired glucose tolerance)   . High coronary artery calcium score    Score of 1460.  Coronary CTA shows high-grade proximal-mid LAD stenosis (CT FFR 0.50).  Also PDA/PLA 60 to 70% stenosis (CTO FFR 0.75)  . History of kidney stones   . Hypertension   . Migraine headache    with visual aura  . Obesity   . OSA (obstructive sleep apnea)    not treated  . Postmenopausal   . Vertigo    post concussive     PAST SURGICAL HISTORY   Past Surgical History:  Procedure Laterality Date  . ABDOMINAL HYSTERECTOMY  2003   partial- L ovary remains  . ABDOMINAL HYSTERECTOMY    . BUNIONECTOMY     right great toe  . CHOLECYSTECTOMY N/A 06/05/2018   Procedure: LAPAROSCOPIC CHOLECYSTECTOMY WITH POSSIBLE INTRAOPERATIVE CHOLANGIOGRAM;  Surgeon: Erroll Luna, MD;  Location: [facility];  Service: General;  Laterality: N/A;  . GASTRIC BYPASS  11/2015   [facility]  . HAND SURGERY    . TUBAL LIGATION Bilateral      MEDICATIONS/ALLERGIES   Current Meds  Medication Sig  . aspirin EC 81 MG tablet Take 1 tablet (81 mg total) by mouth daily.  . Coenzyme Q10 (CO Q10) 200 MG CAPS Take 1 capsule by mouth daily. TAKE 1 CAPSULE DAILY  . DHEA 25 MG CAPS Take 1 capsule by mouth daily.  [name] estradiol (ESTRACE) 2 MG tablet Take 2 mg by mouth daily.  [name] ibuprofen (ADVIL,MOTRIN) 800 MG tablet Take 1 tablet (800 mg total) by mouth every 8 (eight) hours as needed.  [name] losartan-hydrochlorothiazide (HYZAAR) 100-12.5 MG tablet Take 1 tablet by mouth at bedtime.  . Magnesium 500 MG TABS Take 1,000 mg by mouth daily. TAKE 2 TABLETS DAILY  . metFORMIN (GLUCOPHAGE) 500 MG tablet Take 500 mg by mouth 2 (two)  times daily.  . pantoprazole (PROTONIX) 40 MG tablet Take 40 mg by mouth at bedtime.  . progesterone (PROMETRIUM) 200 MG capsule Take 200 mg by mouth at bedtime.  . rosuvastatin (CRESTOR) 20 MG tablet Take 1 tablet (20 mg total) by mouth daily.  . sertraline (ZOLOFT) 50 MG tablet Take 100 mg by mouth at bedtime.    Allergies  Allergen Reactions  . Ceclor [Cefaclor] Hives and Rash  . Tetracyclines & Related Hives  . Amoxicillin Hives  . Ampicillin Hives  . Ceclor [Cefaclor] Hives  . Lipitor [Atorvastatin Calcium] Cough  . Penicillins Swelling and Other (See Comments)    Has patient had a PCN reaction causing immediate rash, facial/tongue/throat swelling, SOB or lightheadedness with hypotension: No Has patient had a PCN reaction causing severe rash involving mucus membranes or skin necrosis: No Has patient had a PCN reaction that required hospitalization: Yes Has patient had a PCN reaction occurring within the last 10 years: No If all of the above answers are "NO", then may proceed with Cephalosporin use.  [name]  Tetracyclines & Related Hives  . Amoxicillin Rash  . Ampicillin Rash  . Atorvastatin Cough  . Lisinopril Cough  . Simvastatin Cough  . Tetracycline Rash     SOCIAL HISTORY/FAMILY HISTORY   Social History   Tobacco Use  . Smoking status: Former Smoker    Packs/day: 0.10    Years: 30.00    Pack years: 3.00    Types: Cigarettes    Quit date: 04/26/2012    Years since quitting: 7.0  . Smokeless tobacco: Never Used  Substance Use Topics  . Alcohol use: Yes    Comment: occasional  . Drug use: No   Social History   Social History Narrative   ** Merged History Encounter **       Lives with husband [name]). She travels very frequently with her job and is gone most weeks out of the month. She is active and exercises when able.    family history includes Alzheimer's disease in her father and paternal grandmother; Cancer in her father and paternal grandfather; Diabetes  Mellitus II in her sister; Hyperlipidemia in her sister; Hypertension in her mother and sister; Migraines in her father.   OBJCTIVE -PE, EKG, labs   Wt Readings from Last 3 Encounters:  05/06/19 202 lb (91.6 kg)  04/16/19 198 lb (89.8 kg)  06/05/18 196 lb (88.9 kg)    Physical Exam: BP 130/78 (BP Location: Left Arm, Patient Position: Sitting, Cuff Size: Normal)   Pulse (!) 59   Temp (!) 97.2 F (36.2 C)   Ht 5\' 7"  (1.702 m)   Wt 202 lb (91.6 kg)   BMI 31.64 kg/m  Physical Exam  Constitutional: She is oriented to person, place, and time. She appears well-developed and well-nourished. No distress.  Mildly obese.  Well-groomed.  HENT:  Head: Normocephalic and atraumatic.  Eyes: Pupils are equal, round, and reactive to light. Conjunctivae and EOM are normal.  Neck: Normal range of motion. Neck supple. No hepatojugular reflux and no JVD present. Carotid bruit is not present.  Cardiovascular: Normal rate, regular rhythm, normal heart sounds and intact distal pulses.  No extrasystoles are present. PMI is not displaced. Exam reveals no gallop and no friction rub.  No murmur heard. Pulmonary/Chest: Effort normal and breath sounds normal. No respiratory distress. She has no wheezes. She has no rales.  Good air movement  Abdominal: Soft. Bowel sounds are normal. She exhibits no distension. There is no abdominal tenderness. There is no rebound.  Musculoskeletal: Normal range of motion.        General: No deformity or edema.  Neurological: She is alert and oriented to person, place, and time. No cranial nerve deficit. Coordination normal.  Skin: Skin is warm and dry. No erythema.  Psychiatric: Her behavior is normal. Judgment and thought content normal.  Very pleasant mood and affect.  Vitals reviewed.   Adult ECG Report  Rate: 59 ;  Rhythm: sinus bradycardia and Cannot exclude anterior MI, age undetermined.  Otherwise normal axis, intervals and durations.;   Narrative Interpretation:  Stable EKG  Recent Labs:    TC 234, TG 80.5, HDL 33, LDL 136 --> LDL-P2 2056* (Very High >2000, goal <1000) , HDL-P 35.4 (goal> 30.5).  Small LDL-P9 51 (high.  Goal<527).  LDL size 20.9nm (goal >20.5).  LP-IR score 74 (high, goal<45)   ASSESSMENT/PLAN    Problem List Items Addressed This Visit    Agatston coronary artery calcium score greater than 400 - Primary (Chronic)   Relevant Orders  EKG 12-Lead   LEFT HEART CATHETERIZATION WITH CORONARY ANGIOGRAM   Basic metabolic panel (Completed)   CBC (Completed)   Hypertension (Chronic)   Relevant Orders   EKG 12-Lead   LEFT HEART CATHETERIZATION WITH CORONARY ANGIOGRAM   Basic metabolic panel (Completed)   CBC (Completed)    Other Visit Diagnoses    Preop cardiovascular exam       Relevant Orders   EKG [id number]   Basic metabolic panel (Completed)   CBC (Completed)     Performing MD:  [name], M.D., M.S.  Procedure:  LEFT HEART CATHETERIZATION WITH CORONARY ANGIOGAPHY and possible PERCUTANEOUS CORONARY INTERVENTION.  The procedure with Risks/Benefits/Alternatives and Indications was reviewed with the patient (& husband who was on video conference call).  All questions were answered.    Risks / Complications include, but not limited to: Death, MI, CVA/TIA, VF/VT (with defibrillation), Bradycardia (need for temporary pacer placement), contrast induced nephropathy, bleeding / bruising / hematoma / pseudoaneurysm, vascular or coronary injury (with possible emergent CT or Vascular Surgery), adverse medication reactions, infection.  Additional risks involving the use of radiation with the possibility of radiation burns and cancer were explained in detail.  The patient  (and family) voice understanding and agree to proceed.     COVID-19 Education: The signs and symptoms of COVID-19 were discussed with the patient and how to seek care for testing (follow up with PCP or arrange E-visit).   The importance of social distancing was  discussed today.  I spent a total of 30 minutes with the patient and chart review. >  50% of the time was spent in direct patient consultation.  Additional time spent with chart review (studies, outside notes, etc): 15 Total Time: 45 min   Current medicines are reviewed at length with the patient today.  (+/- concerns) n/a   Patient Instructions / Medication Changes & Studies & Tests Ordered   Patient Instructions  Medication Instructions:  NO CHANGES  If you need a refill on your cardiac medications before your next appointment, please call your pharmacy.   Lab work: CBC BMP If you have labs (blood work) drawn today and your tests are completely normal, you will receive your results only by: [name] MyChart Message (if you have MyChart) OR . A paper copy in the mail If you have any lab test that is abnormal or we need to change your treatment, we will call you to review the results.  Testing/Procedures: WILL BE SCHEDULE AT [place] 20 , 2020 Your physician has requested that you have a cardiac catheterization. Cardiac catheterization is used to diagnose and/or treat various heart conditions. Doctors may recommend this procedure for a number of different reasons. The most common reason is to evaluate chest pain. Chest pain can be a symptom of coronary artery disease (CAD), and cardiac catheterization can show whether plaque is narrowing or blocking your heart's arteries. This procedure is also used to evaluate the valves, as well as measure the blood flow and oxygen levels in different parts of your heart. For further information please visit [email or web address]. Please follow instruction sheet, as given.   Follow-Up: At [facility], you and your health needs are our priority.  As part of our continuing mission to provide you with exceptional heart care, we have created designated Provider Care Teams.  These Care Teams include your primary Cardiologist (physician) and Advanced  Practice Providers (APPs -  Physician Assistants and Nurse Practitioners) who all work together to provide you  with the care you need, when you need it. . You will need a follow up appointment in 2 WEEKS .   You may see [name], MD or one of the following Advanced Practice Providers on your designated Care Team:   . Rosaria Ferries, PA-C . [name], DNP, ANP  Any Other Special Instructions Will Be Listed Below (If Applicable).         [place] [address] [place] [address] Dept: [phone] Loc: [facility]  05/06/2019  You are scheduled for a Cardiac Catheterization on TUESDAY, October 20 with Dr. [name].  1. Please arrive at the [facility] (Main Entrance A) at [facility]: [address] [place], [place] [address] at  5:30 AM  (This time is two hours before your procedure to ensure your preparation). Free valet parking service is available.   Special note: Every effort is made to have your procedure done on time. Please understand that emergencies sometimes delay scheduled procedures.  2. Diet: Do not eat solid foods after midnight.  The patient may have clear liquids until 5am upon the day of the procedure.   3. Labs: You will need to have blood drawn on  CBC BMP, TODAY  at [place], [place]  Open: 8am - 5pm (Lunch 12:30 - 1:30)   Phone: [phone]. You do not need to be fasting.    [place] 16 ,2020 AT 2:20 PM-- -PLEASE  GO TO [id number] [place] [address])  THEN SELF ISOLATE UNTIL  CARDIAC CATH  ON TUESDAY May 12 2019 .  4. Medication instructions in preparation for your procedure:  On the morning of your procedure, take your Aspirin 81 MG and any morning medicines NOT listed above.  You may use sips of water.  5. Plan for one night stay--bring personal  belongings. 6. Bring a current list of your medications and current insurance cards. 7. You MUST have a responsible person to drive you home. 8. Someone MUST be with you the first 24 hours after you arrive home or your discharge will be delayed. 9. Please wear clothes that are easy to get on and off and wear slip-on shoes.  Thank you for allowing [place] to care for you!   -- [facility]     Studies Ordered:   Orders Placed This Encounter  Procedures  . Basic metabolic panel  . CBC  . EKG 12-Lead  . LEFT HEART CATHETERIZATION WITH CORONARY Illene Silver, M.D., M.S. Interventional Cardiologist   Pager # [phone] Phone # [phone] [address] [place], [place] [address]   Thank you for choosing Heartcare at [facility]!!

## 2019-05-07 ENCOUNTER — Ambulatory Visit: Payer: Managed Care, Other (non HMO) | Admitting: Cardiology

## 2019-05-07 ENCOUNTER — Encounter: Payer: Self-pay | Admitting: Cardiology

## 2019-05-07 LAB — BASIC METABOLIC PANEL
BUN/Creatinine Ratio: 21 (ref 12–28)
BUN: 19 mg/dL (ref 8–27)
CO2: 21 mmol/L (ref 20–29)
Calcium: 9.6 mg/dL (ref 8.7–10.3)
Chloride: 102 mmol/L (ref 96–106)
Creatinine, Ser: 0.91 mg/dL (ref 0.57–1.00)
GFR calc Af Amer: 79 mL/min/{1.73_m2} (ref >59)
GFR calc non Af Amer: 68 mL/min/{1.73_m2} (ref >59)
Glucose: 88 mg/dL (ref 65–99)
Potassium: 4.1 mmol/L (ref 3.5–5.2)
Sodium: 139 mmol/L (ref 134–144)

## 2019-05-07 LAB — CBC
Hematocrit: 40.1 % (ref 34.0–46.6)
Hemoglobin: 13.6 g/dL (ref 11.1–15.9)
MCH: 29.4 pg (ref 26.6–33.0)
MCHC: 33.9 g/dL (ref 31.5–35.7)
MCV: 87 fL (ref 79–97)
Platelets: 206 10*3/uL (ref 150–450)
RBC: 4.63 x10E6/uL (ref 3.77–5.28)
RDW: 13.1 % (ref 11.7–15.4)
WBC: 7.6 10*3/uL (ref 3.4–10.8)

## 2019-05-07 NOTE — Assessment & Plan Note (Signed)
Significantly elevated score confirmed by our study with score of 1462.  High-grade lesion noted in the proximal to mid LAD as well as possible PLA/PDA.  Risk factors include hypertension significant hyperlipidemia and prior smoking.  Acknowledges some exertional dyspnea and palpitations concerning for possible atypical angina.  Plan: LEFT HEART CATHETERIZATION WITH CORONARY ANGIOGRAPHY AND POSSIBLE PCI.

## 2019-05-07 NOTE — Assessment & Plan Note (Signed)
Recently started on Crestor 20 mg daily.  Had issues with in the past, seems to be tolerating this well.  We will reassess in December.  May need to titrate further depending results.

## 2019-05-07 NOTE — Assessment & Plan Note (Signed)
She actually has had irregular heartbeats and palpitations associated with exercise that she had not been noticing before.  Not noticing chest pain with exertion, but is only noticing palpitations with exertion.  This could be a potential anginal equivalent.  Has not been noted tolerate beta-blocker in the past because of bradycardia.  Potentially ischemic related palpitations are worrisome.  Will be evaluated with cardiac catheterization.

## 2019-05-07 NOTE — Assessment & Plan Note (Signed)
Continues to use CPAP.  Previously diagnosed with obesity hypoventilation syndrome.  This has been relatively stable.

## 2019-05-07 NOTE — Assessment & Plan Note (Signed)
On ARB, but not on beta-blocker because a history of bradycardia.  Currently on max dose of ARB.  If pressures are would be elevated, we could probably consider amlodipine.

## 2019-05-08 ENCOUNTER — Other Ambulatory Visit (HOSPITAL_COMMUNITY)
Admission: RE | Admit: 2019-05-08 | Discharge: 2019-05-08 | Disposition: A | Discharge status: Home / Self Care | Payer: Managed Care, Other (non HMO) | Source: Ambulatory Visit | Attending: Cardiology | Admitting: Cardiology

## 2019-05-08 DIAGNOSIS — Z20828 Contact with and (suspected) exposure to other viral communicable diseases: Secondary | ICD-10-CM | POA: Diagnosis not present

## 2019-05-08 DIAGNOSIS — Z01812 Encounter for preprocedural laboratory examination: Secondary | ICD-10-CM | POA: Insufficient documentation

## 2019-05-09 LAB — NOVEL CORONAVIRUS, NAA (HOSP ORDER, SEND-OUT TO REF LAB; TAT 18-24 HRS): SARS-CoV-2, NAA: NOT DETECTED

## 2019-05-11 ENCOUNTER — Telehealth: Payer: Self-pay | Admitting: *Deleted

## 2019-05-11 NOTE — Telephone Encounter (Signed)
Pt contacted pre-catheterization scheduled at [facility] for: Tuesday May 12, 2019 7:30 AM Verified arrival time and place: [facility]) at: 5:30 AM   No solid food after midnight prior to cath, clear liquids until 5 AM day of procedure. Contrast allergy: no   Hold: Metformin-day of procedure and 48 hours post procedure. HCTZ-AM of procedure.  Except hold medications AM meds can be  taken pre-cath with sip of water including: ASA 81 mg   Confirmed patient has responsible adult to drive home post procedure and observe 24 hours after arriving home: yes  Currently, due to Covid-19 pandemic, only one support person will be allowed with patient. Must be the same support person for that patient's entire stay, will be screened and required to wear a mask. They will be asked to wait in the waiting room for the duration of the patient's stay.  Patients are required to wear a mask when they enter the hospital.      COVID-19 Pre-Screening Questions:  . In the past 7 to 10 days have you had a cough,  shortness of breath, headache, congestion, fever (100 or greater) body aches, chills, sore throat, or sudden loss of taste or sense of smell? no . Have you been around anyone with known Covid 19? no . Have you been around anyone who is awaiting Covid 19 test results in the past 7 to 10 days? no . Have you been around anyone who has been exposed to Covid 19, or has mentioned symptoms of Covid 19 within the past 7 to 10 days? no  I reviewed procedure/mask/visitor instructions, Covid-19 screening questions with patient, she verbalized understanding, thanked me for call.

## 2019-05-12 ENCOUNTER — Encounter (HOSPITAL_COMMUNITY)
Admission: RE | Disposition: A | Discharge status: Home / Self Care | Payer: Self-pay | Source: Home / Self Care | Attending: Cardiology

## 2019-05-12 ENCOUNTER — Ambulatory Visit (HOSPITAL_COMMUNITY)
Admission: RE | Admit: 2019-05-12 | Discharge: 2019-05-12 | Disposition: A | Discharge status: Home / Self Care | Payer: Managed Care, Other (non HMO) | Attending: Cardiology | Admitting: Cardiology

## 2019-05-12 ENCOUNTER — Other Ambulatory Visit: Payer: Self-pay

## 2019-05-12 DIAGNOSIS — Z955 Presence of coronary angioplasty implant and graft: Secondary | ICD-10-CM

## 2019-05-12 DIAGNOSIS — Z881 Allergy status to other antibiotic agents status: Secondary | ICD-10-CM | POA: Insufficient documentation

## 2019-05-12 DIAGNOSIS — R0600 Dyspnea, unspecified: Secondary | ICD-10-CM | POA: Diagnosis not present

## 2019-05-12 DIAGNOSIS — I2510 Atherosclerotic heart disease of native coronary artery without angina pectoris: Secondary | ICD-10-CM

## 2019-05-12 DIAGNOSIS — Z6831 Body mass index (BMI) 31.0-31.9, adult: Secondary | ICD-10-CM | POA: Insufficient documentation

## 2019-05-12 DIAGNOSIS — Z87891 Personal history of nicotine dependence: Secondary | ICD-10-CM | POA: Insufficient documentation

## 2019-05-12 DIAGNOSIS — G4733 Obstructive sleep apnea (adult) (pediatric): Secondary | ICD-10-CM | POA: Insufficient documentation

## 2019-05-12 DIAGNOSIS — Z888 Allergy status to other drugs, medicaments and biological substances status: Secondary | ICD-10-CM | POA: Insufficient documentation

## 2019-05-12 DIAGNOSIS — Z90710 Acquired absence of both cervix and uterus: Secondary | ICD-10-CM | POA: Insufficient documentation

## 2019-05-12 DIAGNOSIS — I10 Essential (primary) hypertension: Secondary | ICD-10-CM | POA: Insufficient documentation

## 2019-05-12 DIAGNOSIS — K219 Gastro-esophageal reflux disease without esophagitis: Secondary | ICD-10-CM | POA: Insufficient documentation

## 2019-05-12 DIAGNOSIS — E785 Hyperlipidemia, unspecified: Secondary | ICD-10-CM | POA: Diagnosis not present

## 2019-05-12 DIAGNOSIS — Z7984 Long term (current) use of oral hypoglycemic drugs: Secondary | ICD-10-CM | POA: Diagnosis not present

## 2019-05-12 DIAGNOSIS — Z880 Allergy status to penicillin: Secondary | ICD-10-CM | POA: Diagnosis not present

## 2019-05-12 DIAGNOSIS — I2584 Coronary atherosclerosis due to calcified coronary lesion: Secondary | ICD-10-CM | POA: Diagnosis not present

## 2019-05-12 DIAGNOSIS — Z01810 Encounter for preprocedural cardiovascular examination: Secondary | ICD-10-CM

## 2019-05-12 DIAGNOSIS — F329 Major depressive disorder, single episode, unspecified: Secondary | ICD-10-CM | POA: Diagnosis not present

## 2019-05-12 DIAGNOSIS — Z7982 Long term (current) use of aspirin: Secondary | ICD-10-CM | POA: Insufficient documentation

## 2019-05-12 DIAGNOSIS — Z8249 Family history of ischemic heart disease and other diseases of the circulatory system: Secondary | ICD-10-CM | POA: Insufficient documentation

## 2019-05-12 DIAGNOSIS — E669 Obesity, unspecified: Secondary | ICD-10-CM | POA: Diagnosis not present

## 2019-05-12 DIAGNOSIS — R931 Abnormal findings on diagnostic imaging of heart and coronary circulation: Secondary | ICD-10-CM | POA: Insufficient documentation

## 2019-05-12 DIAGNOSIS — Z79899 Other long term (current) drug therapy: Secondary | ICD-10-CM | POA: Diagnosis not present

## 2019-05-12 DIAGNOSIS — F419 Anxiety disorder, unspecified: Secondary | ICD-10-CM | POA: Diagnosis not present

## 2019-05-12 DIAGNOSIS — Z9884 Bariatric surgery status: Secondary | ICD-10-CM | POA: Insufficient documentation

## 2019-05-12 HISTORY — PX: CORONARY STENT INTERVENTION: CATH118234

## 2019-05-12 HISTORY — PX: LEFT HEART CATH AND CORONARY ANGIOGRAPHY: CATH118249

## 2019-05-12 LAB — POCT ACTIVATED CLOTTING TIME
Activated Clotting Time: 246 seconds
Activated Clotting Time: 345 seconds

## 2019-05-12 SURGERY — LEFT HEART CATH AND CORONARY ANGIOGRAPHY
Anesthesia: LOCAL

## 2019-05-12 MED ORDER — ANGIOPLASTY BOOK
Status: AC
  Filled 2019-05-12: qty 1

## 2019-05-12 MED ORDER — FENTANYL CITRATE (PF) 100 MCG/2ML IJ SOLN
INTRAMUSCULAR | Status: DC | PRN
  Administered 2019-05-12: 25 ug via INTRAVENOUS

## 2019-05-12 MED ORDER — HYDRALAZINE HCL 20 MG/ML IJ SOLN: 10.0000 mg | INTRAMUSCULAR | Status: DC | PRN

## 2019-05-12 MED ORDER — NITROGLYCERIN 0.4 MG SL SUBL
SUBLINGUAL_TABLET | SUBLINGUAL | Status: AC
  Administered 2019-05-12: 0.4 mg via SUBLINGUAL
  Filled 2019-05-12: qty 1

## 2019-05-12 MED ORDER — ONDANSETRON HCL 4 MG/2ML IJ SOLN: 4.0000 mg | Freq: Four times a day (QID) | INTRAMUSCULAR | Status: DC | PRN

## 2019-05-12 MED ORDER — TICAGRELOR 90 MG PO TABS
ORAL_TABLET | ORAL | Status: DC | PRN
  Administered 2019-05-12: 180 mg via ORAL

## 2019-05-12 MED ORDER — HYDROCHLOROTHIAZIDE 12.5 MG PO CAPS: 12.5000 mg | ORAL_CAPSULE | Freq: Every day | ORAL | Status: DC

## 2019-05-12 MED ORDER — LABETALOL HCL 5 MG/ML IV SOLN: 10.0000 mg | INTRAVENOUS | Status: DC | PRN

## 2019-05-12 MED ORDER — MIDAZOLAM HCL 2 MG/2ML IJ SOLN
INTRAMUSCULAR | Status: DC | PRN
  Administered 2019-05-12: 1 mg via INTRAVENOUS

## 2019-05-12 MED ORDER — VERAPAMIL HCL 2.5 MG/ML IV SOLN
INTRAVENOUS | Status: AC
  Filled 2019-05-12: qty 2

## 2019-05-12 MED ORDER — IOHEXOL 350 MG/ML SOLN
INTRAVENOUS | Status: DC | PRN
  Administered 2019-05-12: 85 mL

## 2019-05-12 MED ORDER — SODIUM CHLORIDE 0.9% FLUSH: 3.0000 mL | Freq: Two times a day (BID) | INTRAVENOUS | Status: DC

## 2019-05-12 MED ORDER — ASPIRIN EC 81 MG PO TBEC: 81.0000 mg | DELAYED_RELEASE_TABLET | Freq: Every day | ORAL | Status: DC

## 2019-05-12 MED ORDER — SODIUM CHLORIDE 0.9 % IV SOLN
INTRAVENOUS | Status: DC
  Administered 2019-05-12: 06:00:00 via INTRAVENOUS

## 2019-05-12 MED ORDER — SODIUM CHLORIDE 0.9 % IV SOLN: INTRAVENOUS | Status: DC

## 2019-05-12 MED ORDER — MORPHINE SULFATE (PF) 2 MG/ML IV SOLN: 2.0000 mg | INTRAVENOUS | Status: DC | PRN

## 2019-05-12 MED ORDER — ATROPINE SULFATE 1 MG/10ML IJ SOSY
PREFILLED_SYRINGE | INTRAMUSCULAR | Status: AC
  Filled 2019-05-12: qty 10

## 2019-05-12 MED ORDER — ESTRADIOL 2 MG PO TABS: 2.0000 mg | ORAL_TABLET | Freq: Every day | ORAL | Status: DC

## 2019-05-12 MED ORDER — HEPARIN SODIUM (PORCINE) 1000 UNIT/ML IJ SOLN
INTRAMUSCULAR | Status: AC
  Filled 2019-05-12: qty 1

## 2019-05-12 MED ORDER — TICAGRELOR 90 MG PO TABS
ORAL_TABLET | ORAL | Status: AC
  Filled 2019-05-12: qty 2

## 2019-05-12 MED ORDER — SODIUM CHLORIDE 0.9% FLUSH: 3.0000 mL | INTRAVENOUS | Status: DC | PRN

## 2019-05-12 MED ORDER — LIDOCAINE HCL (PF) 1 % IJ SOLN
INTRAMUSCULAR | Status: AC
  Filled 2019-05-12: qty 30

## 2019-05-12 MED ORDER — PANTOPRAZOLE SODIUM 40 MG PO TBEC: 40.0000 mg | DELAYED_RELEASE_TABLET | ORAL | Status: DC | PRN

## 2019-05-12 MED ORDER — SODIUM CHLORIDE 0.9 % IV SOLN: 250.0000 mL | INTRAVENOUS | Status: DC | PRN

## 2019-05-12 MED ORDER — SODIUM CHLORIDE 0.9 % IV SOLN
INTRAVENOUS | Status: DC
  Administered 2019-05-12: 09:00:00 via INTRAVENOUS

## 2019-05-12 MED ORDER — TICAGRELOR 90 MG PO TABS: 90.0000 mg | ORAL_TABLET | Freq: Two times a day (BID) | ORAL | 0 refills | Status: DC

## 2019-05-12 MED ORDER — ONDANSETRON HCL 4 MG/2ML IJ SOLN
INTRAMUSCULAR | Status: AC
  Administered 2019-05-12: 4 mg
  Filled 2019-05-12: qty 2

## 2019-05-12 MED ORDER — NITROGLYCERIN 0.4 MG SL SUBL: 0.4000 mg | SUBLINGUAL_TABLET | SUBLINGUAL | 2 refills | Status: DC | PRN

## 2019-05-12 MED ORDER — HEPARIN (PORCINE) IN NACL 1000-0.9 UT/500ML-% IV SOLN
INTRAVENOUS | Status: DC | PRN
  Administered 2019-05-12 (×2): 500 mL

## 2019-05-12 MED ORDER — HEPARIN SODIUM (PORCINE) 1000 UNIT/ML IJ SOLN
INTRAMUSCULAR | Status: DC | PRN
  Administered 2019-05-12: 5000 [IU] via INTRAVENOUS
  Administered 2019-05-12: 4000 [IU] via INTRAVENOUS
  Administered 2019-05-12: 2000 [IU] via INTRAVENOUS

## 2019-05-12 MED ORDER — ACETAMINOPHEN 325 MG PO TABS
650.0000 mg | ORAL_TABLET | ORAL | Status: DC | PRN
  Administered 2019-05-12: 650 mg via ORAL
  Filled 2019-05-12: qty 2

## 2019-05-12 MED ORDER — VERAPAMIL HCL 2.5 MG/ML IV SOLN
INTRAVENOUS | Status: DC | PRN
  Administered 2019-05-12: 08:00:00 10 mL via INTRA_ARTERIAL

## 2019-05-12 MED ORDER — MORPHINE SULFATE (PF) 2 MG/ML IV SOLN
INTRAVENOUS | Status: AC
  Administered 2019-05-12: 09:00:00 2 mg via INTRAVENOUS
  Filled 2019-05-12: qty 1

## 2019-05-12 MED ORDER — LIDOCAINE HCL (PF) 1 % IJ SOLN
INTRAMUSCULAR | Status: DC | PRN
  Administered 2019-05-12: 2 mL

## 2019-05-12 MED ORDER — SODIUM CHLORIDE 0.9 % IV SOLN: INTRAVENOUS | Status: AC

## 2019-05-12 MED ORDER — DHEA 25 MG PO CAPS: 25.0000 mg | ORAL_CAPSULE | Freq: Every day | ORAL | Status: DC

## 2019-05-12 MED ORDER — MIDAZOLAM HCL 2 MG/2ML IJ SOLN
INTRAMUSCULAR | Status: AC
  Filled 2019-05-12: qty 2

## 2019-05-12 MED ORDER — LOSARTAN POTASSIUM 50 MG PO TABS: 100.0000 mg | ORAL_TABLET | Freq: Every day | ORAL | Status: DC

## 2019-05-12 MED ORDER — NITROGLYCERIN 0.4 MG SL SUBL
0.4000 mg | SUBLINGUAL_TABLET | SUBLINGUAL | Status: DC | PRN
  Administered 2019-05-12: 09:00:00 0.4 mg via SUBLINGUAL

## 2019-05-12 MED ORDER — FENTANYL CITRATE (PF) 100 MCG/2ML IJ SOLN
INTRAMUSCULAR | Status: AC
  Filled 2019-05-12: qty 2

## 2019-05-12 MED ORDER — MORPHINE SULFATE (PF) 2 MG/ML IV SOLN
2.0000 mg | Freq: Once | INTRAVENOUS | Status: AC
  Administered 2019-05-12: 09:00:00 2 mg via INTRAVENOUS

## 2019-05-12 MED ORDER — TICAGRELOR 90 MG PO TABS: 90.0000 mg | ORAL_TABLET | Freq: Two times a day (BID) | ORAL | Status: DC

## 2019-05-12 MED ORDER — CO Q10 200 MG PO CAPS: 200.0000 mg | ORAL_CAPSULE | Freq: Every day | ORAL | Status: DC

## 2019-05-12 MED ORDER — VITAMIN D3 125 MCG (5000 UT) PO TABS: 5000.0000 [IU] | ORAL_TABLET | Freq: Every day | ORAL | Status: DC

## 2019-05-12 MED ORDER — HEPARIN (PORCINE) IN NACL 1000-0.9 UT/500ML-% IV SOLN
INTRAVENOUS | Status: AC
  Filled 2019-05-12: qty 1000

## 2019-05-12 MED FILL — BRILINTA 90 MG TABLET: 90 | 30 days supply | Qty: 60 | Fill #0

## 2019-05-12 SURGICAL SUPPLY — 19 items
BALLN EMERGE MR 2.5X8 (BALLOONS) ×2
BALLN WOLVERINE 2.75X6 (BALLOONS) ×2
BALLOON EMERGE MR 2.5X8 (BALLOONS) IMPLANT
BALLOON WOLVERINE 2.75X6 (BALLOONS) IMPLANT
CATH INFINITI 5FR ANG PIGTAIL (CATHETERS) ×1 IMPLANT
CATH LAUNCHER 6FR EBU3.5 (CATHETERS) ×1 IMPLANT
CATH OPTITORQUE TIG 4.0 5F (CATHETERS) ×1 IMPLANT
DEVICE RAD COMP TR BAND LRG (VASCULAR PRODUCTS) ×1 IMPLANT
GLIDESHEATH SLEND SS 6F .021 (SHEATH) ×1 IMPLANT
GUIDEWIRE INQWIRE 1.5J.035X260 (WIRE) IMPLANT
INQWIRE 1.5J .035X260CM (WIRE) ×2
KIT ENCORE 26 ADVANTAGE (KITS) ×1 IMPLANT
KIT HEART LEFT (KITS) ×2 IMPLANT
PACK CARDIAC CATHETERIZATION (CUSTOM PROCEDURE TRAY) ×2 IMPLANT
SHEATH PROBE COVER 6X72 (BAG) ×1 IMPLANT
STENT RESOLUTE ONYX 3.5X8 (Permanent Stent) ×1 IMPLANT
TRANSDUCER W/STOPCOCK (MISCELLANEOUS) ×2 IMPLANT
TUBING CIL FLEX 10 FLL-RA (TUBING) ×2 IMPLANT
WIRE ASAHI PROWATER 180CM (WIRE) ×1 IMPLANT

## 2019-05-12 NOTE — Progress Notes (Addendum)
(  L) jaw pain sudden with radiation to (L) chest and back. O2 applied, Dr Ellyn Hack notified. Fluids up to 999. Pt head lowered and feet lifted.   Pt also c/o being warm. HR in the 40s and 50s(see VS record)

## 2019-05-12 NOTE — Interval H&P Note (Signed)
History and Physical Interval Note:  05/12/2019 7:25 AM  Carrie Reynolds  has presented today for surgery, with the diagnosis of Abnormal CTA.  The various methods of treatment have been discussed with the patient and family. After consideration of risks, benefits and other options for treatment, the patient has consented to  Procedure(s): LEFT HEART CATH AND CORONARY ANGIOGRAPHY (N/A)  PERCUTANEOUS CORONARY INTERVENTION  as a surgical intervention.  The patient's history has been reviewed, patient examined, no change in status, stable for surgery.  I have reviewed the patient's chart and labs.  Questions were answered to the patient's satisfaction.    Cath Lab Visit (complete for each Cath Lab visit)  Clinical Evaluation Leading to the Procedure:   ACS: No.  Non-ACS:    Anginal Classification: CCS II  Anti-ischemic medical therapy: Minimal Therapy (1 class of medications)  Non-Invasive Test Results: High-risk stress test findings: cardiac mortality >3%/year -- HIGH RISK Coronary CT Angiogram  Prior CABG: No previous CABG   [name]

## 2019-05-12 NOTE — Discharge Summary (Signed)
Discharge Summary/SAME DAY PCI    Patient ID: Carrie Reynolds,  MRN: [id number], DOB/AGE: 1955-11-16 63 y.o.  Admit date: 05/12/2019 Discharge date: 05/12/2019  Primary Care Provider: Leonard Downing Primary Cardiologist: Dr. Ellyn Hack   Discharge Diagnoses    Principal Problem:   Abnormal cardiac CT angiography Active Problems:   Agatston coronary artery calcium score greater than 400   Preop cardiovascular exam   Allergies Allergies  Allergen Reactions  . Ceclor [Cefaclor] Hives and Rash  . Tetracyclines & Related Hives  . Lipitor [Atorvastatin Calcium] Cough  . Penicillins Swelling and Other (See Comments)    Has patient had a PCN reaction causing immediate rash, facial/tongue/throat swelling, SOB or lightheadedness with hypotension: No Has patient had a PCN reaction causing severe rash involving mucus membranes or skin necrosis: No Has patient had a PCN reaction that required hospitalization: Yes Has patient had a PCN reaction occurring within the last 10 years: No If all of the above answers are "NO", then may proceed with Cephalosporin use.  [name] Amoxicillin Rash  . Ampicillin Rash  . Lisinopril Cough  . Simvastatin Cough    Diagnostic Studies/Procedures    Cath: 05/12/19   CULPRIT LESION: Prox LAD to Mid LAD lesion is 95% stenosed with 50% stenosed side branch in 1st Sept.  Scoring balloon angioplasty was performed using a BALLOON WOLVERINE 2.75X6. A drug-eluting stent was successfully placed using a STENT RESOLUTE ONYX 3.5X8. -Postdilated to 3.8 mm  Post intervention, there is a 0% residual stenosis.  Post intervention, the side branch was STABLE @ 55% residual stenosis.  ============  RPAV lesion is 60% stenosed -was noted to be potentially FFR positive by CT, however did not have the appearance of significant lesion and is a small vessel -medical management  The left ventricular systolic function is normal. The left ventricular ejection  fraction is 55-65% by visual estimate. LV end diastolic pressure is normal.   SUMMARY  Severe single-vessel CAD with focal 95% calcified stenosis in the proximal LAD --> successful scoring balloon angioplasty-DES PCI with resolute Onyx DES 3.5 mm x 8 mm (3.8 mm)  Otherwise moderate ostial and proximal RPA V disease (angiographically does not look significant, suspect overcall on CTA) with otherwise mild disease throughout the LAD and diagonal branch.  Normal EF with normal wall motion.  Normal EDP.   RECOMMENDATIONS  Same-day discharge after bedrest with TR band removal in Short Stay  She has follow-up appointment scheduled with [name], DNP  Continue aggressive restart medication with blood pressure and lipid management.  [name], M.D., M.S.  Diagnostic Dominance: Right  Intervention  _____________   History of Present Illness     Carrie Reynolds is a 63 y.o. female with a PMH below who presented to the office for follow up to review results of Coronary CTA.  Carrie Reynolds was last seen on [facility] on April 16, 2019 to discuss the results of the coronary calcium score performed by her PCP. Coronary calcium score was over 1200-has been very high risk. She suggested that she was doing regular exercise including swimming treadmill etc. and was not having any symptoms of dyspnea or chest discomfort, however had noted that maybe her exercise tolerance was down some. Because of very high risk coronary calcium score, we sent her for coronary CT angiogram  She returned to the office on 10/14 to discuss the results of her CT scan. Interestingly, as she has had time to reflect from her  last visit.  She admited to not being fully honest about her symptoms.  She did say that while her normal routine was to do the swimming and and walk the treadmill etc., that she had noted that over the last several months she had been getting more exertional dyspnea had  not necessarily had tightness or chest but had not really pushed beyond the level of dyspnea.  She did however note that when she exerted to a certain point that she felt her heart fluttering. This was a symptom not mentioned during her last visit. She denied any palpitations or irregular heartbeats at rest, but only when exerting herself.   Hospital Course     Underwent cardiac cath noted above with PCI/DES x1 to pLAD. Plan for DAPT with ASA/Brilinta for at 6 months, preferable one year. Seen by cardiac rehab while in short stay. Did have a brief episode of vasovagal activity with drop in blood pressure and HR. Resolved with IVFs. No recurrent symptoms. Radial site stable. Instructions/precautions regarding cath site care given prior to discharge.   [name] was seen by Dr. Ellyn Hack and determined stable for discharge home. Follow up in the office has been arranged. Medications are listed below.   _____________  Discharge Vitals Blood pressure 137/69, pulse (!) 58, temperature 98.2 F (36.8 C), temperature source Oral, resp. rate 13, height 5\' 7"  (1.702 m), weight 90.7 kg, SpO2 100 %.  Filed Weights   05/12/19 0558  Weight: 90.7 kg    Labs & Radiologic Studies    CBC No results for input(s): WBC, NEUTROABS, HGB, HCT, MCV, PLT in the last 72 hours. Basic Metabolic Panel No results for input(s): NA, K, CL, CO2, GLUCOSE, BUN, CREATININE, CALCIUM, MG, PHOS in the last 72 hours. Liver Function Tests No results for input(s): AST, ALT, ALKPHOS, BILITOT, PROT, ALBUMIN in the last 72 hours. No results for input(s): LIPASE, AMYLASE in the last 72 hours. Cardiac Enzymes No results for input(s): CKTOTAL, CKMB, CKMBINDEX, TROPONINI in the last 72 hours. BNP Invalid input(s): POCBNP D-Dimer No results for input(s): DDIMER in the last 72 hours. Hemoglobin A1C No results for input(s): HGBA1C in the last 72 hours. Fasting Lipid Panel No results for input(s): CHOL, HDL, LDLCALC, TRIG,  CHOLHDL, LDLDIRECT in the last 72 hours. Thyroid Function Tests No results for input(s): TSH, T4TOTAL, T3FREE, THYROIDAB in the last 72 hours.  Invalid input(s): FREET3 _____________  Ct Coronary Morph W/cta Cor W/score W/ca W/cm &/or Wo/cm  Addendum Date: 04/29/2019   ADDENDUM REPORT: 04/29/2019 19:48 HISTORY: 63 yo female with heart disease suspected, prior testing abn (CXR, Echo, ECG, or Labs) ELEVATED CALCIUM CARDIAC SCORING EXAM: Cardiac/Coronary CTA TECHNIQUE: The patient was scanned on a [employer]. PROTOCOL: A 120 kV prospective scan was triggered in the descending thoracic aorta at 111 HU's. Axial non-contrast 3 mm slices were carried out through the heart. The data set was analyzed on a dedicated work station and scored using the [employer]. Gantry rotation speed was 250 msecs and collimation was .6 mm. Beta blockade and 0.8 mg of sl NTG was given. The 3D data set was reconstructed in 5% intervals of the 67-82 % of the R-R cycle. Diastolic phases were analyzed on a dedicated work station using MPR, MIP and VRT modes. The patient received 50mL OMNIPAQUE IOHEXOL 350 MG/ML SOLN of contrast. FINDINGS: Coronary calcium score: The patient's coronary artery calcium score is 1462, which places the patient in the 99th percentile. Coronary arteries: Normal coronary origins.  Right dominance. Right Coronary Artery: Right dominant. There is a mild 25-49% mixed proximal stenosis (CADRADS2) and moderate R-PLB stenosis (CADRADS3) 50-69%. Left Main Coronary Artery: Short left main. Bifurcates into the LAD and LCx vessels. No significant stenosis. Left Anterior Descending Coronary Artery: Large vessel which is heavily calcified proximally. Gives off 2 moderate diagonal branches. There is likely severe 70-99% (CADRADS4) diffuse disease of the proximal to mid vessel. The distal LAD tapers and does not reach the LV apex. Collaterals are not noted. Left Circumflex Artery: Mild proximal mixed stenosis  (1-24%, CADRADS1). Small OM branch without disease. Aorta: Normal size, 35 mm at the mid ascending aorta (level of the PA bifurcation) measured double oblique. No calcifications. No dissection. Aortic Valve: Trileaflet.  Mild annular calcification. Other findings: Normal pulmonary vein drainage into the left atrium. Normal left atrial appendage without a thrombus. Normal size of the pulmonary artery. IMPRESSION: 1. Extensive multivessel coronary calcification with severe proximal LAD stenosis and moderate R-PLB stenosis, CADRADS = 4. CT FFR will be performed and reported separately. 2. Coronary calcium score of 1462. This was 99th percentile for age and sex matched control. 3. Normal coronary origin with right dominance. Electronically Signed   By: Pixie Casino M.D.   On: 04/29/2019 19:48   Result Date: 04/29/2019 EXAM: OVER-READ INTERPRETATION  CT CHEST The following report is an over-read performed by radiologist Dr. Vinnie Langton of [facility], [place] on 04/29/2019. This over-read does not include interpretation of cardiac or coronary anatomy or pathology. The coronary calcium score/coronary CTA interpretation by the cardiologist is attached. COMPARISON:  None. FINDINGS: 4 mm subpleural nodule in the lateral segment of the right middle lobe (axial image 24 of series 7). Within the visualized portions of the thorax there are no other larger more suspicious appearing pulmonary nodules or masses, there is no acute consolidative airspace disease, no pleural effusions, no pneumothorax and no lymphadenopathy. Visualized portions of the upper abdomen are unremarkable. There are no aggressive appearing lytic or blastic lesions noted in the visualized portions of the skeleton. IMPRESSION: 1. 4 mm subpleural nodule in the lateral segment of the right middle, nonspecific, but statistically likely benign. No follow-up needed if patient is low-risk. Non-contrast chest CT can be considered in 12 months if patient  is high-risk. This recommendation follows the consensus statement: Guidelines for Management of Incidental Pulmonary Nodules Detected on CT Images: From the Fleischner Society 2017; Radiology 2017; 284:228-243. Electronically Signed: By: Vinnie Langton M.D. On: 04/29/2019 14:24   Ct Coronary Fractional Flow Reserve Data Prep  Result Date: 04/29/2019 EXAM: CT FFR ANALYSIS HISTORY OF PRESENT ILLNESS: 63 yo female with high CAC score, evaluate for obstructive disease PROCEDURE: FFRct analysis was performed on the original cardiac CT angiogram dataset. Diagrammatic representation of the FFRct analysis is provided in a separate PDF document in PACS. This dictation was created using the PDF document and an interactive 3D model of the results. 3D model is not available in the EMR/PACS. Normal FFR range is >0.80. FINDINGS: 1. Left Main:  No significant stenosis. FFR = 1.00 2. LAD: No significant stenosis. Proximal FFR = 0.99, Mid FFR = 0.50, Distal FFR = <0.50 3. Ramus intermedius: Proximal FFR = 0.99, Distal FFR = 0.97 4. LCX: No significant stenosis. Proximal FFR = 0.98, Distal FFR = 0.93 5. RCA: No significant stenosis. Proximal FFR = 0.98, Mid FFR = 0.91, Distal FFR = 0.75 (PDA) IMPRESSION: 1. CT FFR demonstrates significant stenosis of the proximal to mid-LAD occlusion with FFR of  0.5. The R-PDA also demonstrates significant stenosis at 0.75. Electronically Signed   By: Pixie Casino M.D.   On: 04/29/2019 19:33   Disposition   Pt is being discharged home today in good condition.  Follow-up Plans & Appointments    Follow-up Information    Erlene Quan, PA-C Follow up on 05/20/2019.   Specialties: Cardiology, Radiology Why: at 1015am for your follow up appt.  Contact information: [address] [place] [address] [phone]          Discharge Instructions    Amb Referral to Cardiac Rehabilitation   Complete by: As directed    Diagnosis: Coronary Stents   After initial  evaluation and assessments completed: Virtual Based Care may be provided alone or in conjunction with Phase 2 Cardiac Rehab based on patient barriers.: Yes       Discharge Medications     Medication List    STOP taking these medications   ibuprofen 800 MG tablet Commonly known as: ADVIL     TAKE these medications   aspirin EC 81 MG tablet Take 1 tablet (81 mg total) by mouth daily.   Co Q10 200 MG Caps Take 200 mg by mouth daily.   DHEA 25 MG Caps Take 25 mg by mouth daily.   estradiol 2 MG tablet Commonly known as: ESTRACE Take 2 mg by mouth daily.   hydrochlorothiazide 12.5 MG capsule Commonly known as: MICROZIDE Take 12.5 mg by mouth daily.   losartan 100 MG tablet Commonly known as: COZAAR Take 100 mg by mouth daily.   Magnesium 500 MG Tabs Take 500 mg by mouth daily.   metFORMIN 500 MG tablet Commonly known as: GLUCOPHAGE Take 500 mg by mouth 2 (two) times daily.   metoprolol tartrate 50 MG tablet Commonly known as: LOPRESSOR Take 2 tablets (100 mg total) by mouth once for 1 dose. TAKE TWO HOUR PRIOR TO  SCHEDULE CARDIAC TEST   nitroGLYCERIN 0.4 MG SL tablet Commonly known as: Nitrostat Place 1 tablet (0.4 mg total) under the tongue every 5 (five) minutes as needed.   pantoprazole 40 MG tablet Commonly known as: PROTONIX Take 40 mg by mouth as needed Jerrye Bushy).   progesterone 200 MG capsule Commonly known as: PROMETRIUM Take 200 mg by mouth at bedtime.   rosuvastatin 20 MG tablet Commonly known as: CRESTOR Take 1 tablet (20 mg total) by mouth daily.   ticagrelor 90 MG Tabs tablet Commonly known as: Brilinta Take 1 tablet (90 mg total) by mouth 2 (two) times daily.   vitamin B-12 1000 MCG tablet Commonly known as: CYANOCOBALAMIN Take 1,000 mcg by mouth daily.   Vitamin D3 125 MCG (5000 UT) Tabs Take 5,000 Units by mouth daily.                                     Did the patient have a percutaneous coronary intervention (stent /  angioplasty)?:  Yes.     Cath/PCI Registry Performance & Quality Measures: 1. Aspirin prescribed? - Yes 2. ADP Receptor Inhibitor (Plavix/Clopidogrel, Brilinta/Ticagrelor or Effient/Prasugrel) prescribed (includes medically managed patients)? - Yes 3. High Intensity Statin (Lipitor 40-80mg  or Crestor 20-40mg ) prescribed? - Yes 4. For EF <40%, was ACEI/ARB prescribed? - Not Applicable (EF >/= [id number]) 5. For EF <40%, Aldosterone Antagonist (Spironolactone or Eplerenone) prescribed? - Not Applicable (EF >/= [id number]) 6. Cardiac Rehab Phase II ordered (Included Medically managed Patients)? -  Yes    Outstanding Labs/Studies   N/a   Duration of Discharge Encounter   Greater than 30 minutes including physician time.  Signed, Reino Bellis NP-C 05/12/2019, 1:43 PM

## 2019-05-12 NOTE — Progress Notes (Signed)
Pt has been given ntg, Morphine, Zofran and fluid bolus.(See MAR) She has had a STAT EKG.  She has been assessed by  Dr Ellyn Hack and Kendrick Fries.  Pt's pain is a 1 now and she states she is feeling better.  VS return to normal

## 2019-05-12 NOTE — Discharge Instructions (Signed)
Carotid Angioplasty With Stent, Care After °This sheet gives you information about how to care for yourself after your procedure. Your doctor may also give you more specific instructions. If you have problems or questions, contact your doctor. °What can I expect after the procedure? °After the procedure, it is common to have: °· Bruising at the site where the tube (catheter) was inserted. This usually goes away within 1-2 weeks. °· A small amount of blood or clear fluid coming from your cut from surgery (incision). °· A small amount of blood collecting under your skin (hematoma) around the tube site. This may form a lump that can be sore and tender. This usually lasts for 1-2 weeks. °Follow these instructions at home: °Medicines °· Take over-the-counter and prescription medicines only as told by your doctor. °· Your doctor may prescribe a medicine to thin your blood after the procedure. This will help to prevent blood clots. °· If you were prescribed an antibiotic medicine, take it as told by your doctor. Do not stop taking the antibiotic even if you start to feel better. °· Ask your doctor if the medicine prescribed to you requires you to avoid driving or using heavy machinery. °Incision care ° °· Keep your cut from surgery clean and dry. °· Follow instructions from your doctor about how to take care of your cut from surgery. Make sure you: °? Wash your hands with soap and water before and after you change your bandage (dressing). If you cannot use soap and water, use hand sanitizer. °? Change your bandage as told by your doctor. °? Do not rub the site. This may cause bleeding. °? Leave stitches (sutures), skin glue, or skin tape (adhesive) strips in place. They may need to stay in place for 2 weeks or longer. If tape strips get loose and curl up, you may trim the loose edges. Do not remove tape strips completely unless your doctor says it is okay. °· Check the area around your cut every day for signs of infection.  Check for: °? More redness, swelling, or pain. °? More fluid or blood. °? Warmth. °? Pus or a bad smell. °Activity °· Return to your normal activities as told by your doctor. Ask your doctor what activities are safe for you. °· Do not lift anything that is heavier than 10 lb (4.5 kg), or the limit that you are told, until your doctor says that it is safe. °· Avoid sex until your doctor says that this is safe for you. °Eating and drinking ° °· Follow instructions from your doctor about what you cannot eat or drink. °· Drink enough fluid to keep your pee (urine) pale yellow. °· Eat a heart-healthy diet. This includes foods like fresh fruits and vegetables, whole grains, low-fat dairy products, and low-fat (lean) meats. Avoid foods that are: °? High in salt, saturated fat, or sugar. °? Canned or highly processed. °? Fried. °Lifestyle °· If you drink alcohol: °? Limit how much you use to: °§ 0-1 drink a day for women. °§ 0-2 drinks a day for men. °? Be aware of how much alcohol is in your drink. In the [place] one drink equals one 12 oz bottle of beer (355 mL), one 5 oz glass of wine (148 mL), or one 1½ oz glass of hard liquor (44 mL). °· Do not use any products that contain nicotine or tobacco, such as cigarettes, e-cigarettes, and chewing tobacco. If you need help quitting, ask your doctor. °· Work with your doctor   to keep your blood pressure under control. °· Stay at a healthy weight. °General instructions °· Do not take baths, swim, or use a hot tub until your doctor approves. °· Avoid straining to poop to keep the cut from bleeding. °· Tell all doctors who provide care for you that you have a stent. °· Keep all follow-up visits as told by your doctor. This is important. °Contact a doctor if: °· You have more redness, swelling, or pain around your cut. °· You have more fluid or blood coming from your cut. °· The area around your cut feels warm to the touch. °· You have pus or a bad smell coming from your cut. °· You  have a lump caused by bleeding under your skin, and the lump does not go away after 2 weeks. °· You have a fever. °Get help right away if: °· You have a hard time breathing. °· You have pain in your chest. °· You have any signs of a stroke. You may be at risk for a stroke even after this procedure. You may be at greater risk of a stroke if you have diabetes, lung disease, kidney disease, had a previous stroke, or are 80 years of age or older. "BE FAST" is an easy way to remember the main warning signs: °? B - Balance. Signs are dizziness, sudden trouble walking, or loss of balance. °? E - Eyes. Signs are trouble seeing or a change in how you see. °? F - Face. Signs are sudden weakness or loss of feeling of the face, or the face or eyelid drooping on one side. °? A - Arms. Signs are weakness or loss of feeling in an arm. This happens suddenly and usually on one side of the body. °? S - Speech. Signs are sudden trouble speaking, slurred speech, or trouble understanding what people say. °? T - Time. Time to call emergency services. Write down what time symptoms started. °· You have other signs of a stroke, such as: °? A sudden, very bad headache with no known cause. °? Feeling sick to your stomach (nausea). °? Vomiting. °? Jerky movements you cannot control (seizure). °· You notice a lump caused by bleeding under your skin and the lump is quickly getting larger. °· You suddenly get pain in the area where your stent was placed. °· Your cut from surgery starts to bleed and does not stop after you hold pressure on it for a few minutes. °These symptoms may be an emergency. Do not wait to see if the symptoms will go away. Get medical help right away. Call your local emergency services (911 in the [place] Do not drive yourself to the hospital. °Summary °· After the procedure, it is common to have bruising or a small amount of blood or fluid in the area where the surgery cut was made. °· Take over-the-counter and prescription  medicines only as told by your doctor. °· Return to your normal activities as told by your doctor. Ask your doctor what activities are safe for you. °· Make sure you know which symptoms to watch for and when to get medical help right away. °This information is not intended to replace advice given to you by your health care provider. Make sure you discuss any questions you have with your health care provider. °Document Released: 07/14/2013 Document Revised: 08/18/2018 Document Reviewed: 05/22/2018 °Elsevier Patient Education © 2020 [employer] Inc. ° °

## 2019-05-12 NOTE — Progress Notes (Signed)
Mrs. Recktenwald has not had any further complications. Radial site unremarkable.  Cardiac rehab has completed teaching. Pharmacy completed teaching and brought pt a 30 day supply of Brilinta.  Discharge teaching completed.  Pt discharged with husband to home.[name]

## 2019-05-12 NOTE — Progress Notes (Signed)
CARDIAC REHAB PHASE I   Post-stent education completed with pt. Pt educated on importance of ASA and Brilinta. Pt does have concerns taking these medications as she has had gastric bypass in the past. RN aware and will have someone follow-up to address her concerns. Pt given stent card and heart healthy diet. Reviewed restrictions, site care, and exercise guidelines. Will refer to CRP II [place]. Pt is interested in participating in Virtual Cardiac and Pulmonary Rehab. Pt advised that Virtual Cardiac and Pulmonary Rehab is provided at no cost to the patient.  Checklist:  1. Pt has smart device  ie smartphone and/or ipad for downloading an app  Yes 2. Reliable internet/wifi service    Yes 3. Understands how to use their smartphone and navigate within an app.  Yes  Pt verbalized understanding and is in agreement.  [id number] Carrie Falco, RN BSN 05/12/2019 11:02 AM

## 2019-05-13 ENCOUNTER — Telehealth (HOSPITAL_COMMUNITY): Payer: Self-pay

## 2019-05-13 ENCOUNTER — Encounter (HOSPITAL_COMMUNITY): Payer: Self-pay | Admitting: Cardiology

## 2019-05-13 NOTE — Telephone Encounter (Signed)
Pt insurance is active and benefits verified through [employer]. Co-pay $0.00, DED $3,000.00/$640.91 met, out of pocket $7,000.00/$640.91 met, co-insurance 20%. No pre-authorization required. Glenn/[employer], 05/13/2019 @ 1043AM, REF#[id number] °

## 2019-05-13 NOTE — Telephone Encounter (Signed)
Called patient to see if she is interested in the Cardiac Rehab Program. Patient expressed interest. Explained scheduling process and went over insurance, patient verbalized understanding. Will contact patient for scheduling once f/u has been completed. 

## 2019-05-14 ENCOUNTER — Ambulatory Visit: Payer: Self-pay | Admitting: Cardiology

## 2019-05-20 ENCOUNTER — Encounter: Payer: Self-pay | Admitting: Cardiology

## 2019-05-20 ENCOUNTER — Telehealth (HOSPITAL_COMMUNITY): Payer: Self-pay | Admitting: Pharmacist

## 2019-05-20 ENCOUNTER — Ambulatory Visit (INDEPENDENT_AMBULATORY_CARE_PROVIDER_SITE_OTHER): Payer: Managed Care, Other (non HMO) | Admitting: Cardiology

## 2019-05-20 ENCOUNTER — Other Ambulatory Visit: Payer: Self-pay

## 2019-05-20 VITALS — BP 137/88 | HR 67 | Temp 97.1°F | Ht 67.0 in | Wt 203.0 lb

## 2019-05-20 DIAGNOSIS — Z9989 Dependence on other enabling machines and devices: Secondary | ICD-10-CM

## 2019-05-20 DIAGNOSIS — R002 Palpitations: Secondary | ICD-10-CM

## 2019-05-20 DIAGNOSIS — E785 Hyperlipidemia, unspecified: Secondary | ICD-10-CM | POA: Diagnosis not present

## 2019-05-20 DIAGNOSIS — G4733 Obstructive sleep apnea (adult) (pediatric): Secondary | ICD-10-CM

## 2019-05-20 DIAGNOSIS — I2510 Atherosclerotic heart disease of native coronary artery without angina pectoris: Secondary | ICD-10-CM | POA: Insufficient documentation

## 2019-05-20 NOTE — Assessment & Plan Note (Signed)
Discharged on statin Rx- she will need CMET and lipids in 3 months

## 2019-05-20 NOTE — Assessment & Plan Note (Signed)
Compliant 

## 2019-05-20 NOTE — Assessment & Plan Note (Signed)
Controlled.  

## 2019-05-20 NOTE — Telephone Encounter (Signed)
Cardiac Rehab Medication Review by a Pharmacist  Does the patient  feel that his/her medications are working for him/her?  yes  Has the patient been experiencing any side effects to the medications prescribed?  Yes, reports some constipation that started 2 weeks ago (about time she stopped metformin, is addressing with MD tomorrow)   Does the patient measure his/her own blood pressure or blood glucose at home?  Yes, checks BP daily, average numbers around 128-130/77-82  Does the patient have any problems obtaining medications due to transportation or finances?   Yes, reports Brilinta is expensive but has not contacted her pharmacy to find out a co-pay  Understanding of regimen: good Understanding of indications: excellent Potential of compliance: excellent    Pharmacist comments: none other than listed above    Thank you,   Eddie Candle, PharmD PGY-1 Pharmacy Resident   Please check amion for clinical pharmacist contact number    05/20/2019 4:33 PM

## 2019-05-20 NOTE — Progress Notes (Signed)
Cardiology Office Note:    Date:  05/20/2019   ID:  Carrie Reynolds, DOB Apr 17, 1956, MRN [id number]  PCP:  Leonard Downing, MD  Cardiologist:  [name], MD  Electrophysiologist:  None   Referring MD: Leonard Downing, *   No chief complaint on file. post PCI follow up  History of Present Illness:    Carrie Reynolds is a 63 y.o. female with a hx of chest pain, near syncope, OSA on C-pap, and abnormal coronary CTA. She is seen in the office today post LAD PCI.  The patient had a pLAD PCI with DES at the bifurcation of a large Dx1 on 05/12/2019. She had a 60% residual PDA lesion.  Her LVF was normal.  In retrospect she admits she was having exertional symptoms. The patient has done well since discharge.  She notes that she no longer has chest discomfort.  She has noted palpitations- individual beats at rest and tachycardia with minimal exertion.  She says her HR can go to 100 just walking to her car (she has a monitor watch). She was not discharged on a beta blocker secondary to baseline bradycardia.  Her HR in the office today was 53.   Past Medical History:  Diagnosis Date  . Anxiety   . Arnold-Chiari malformation ([place]) 2006  . Cervical strain    syndrome  . Cervicogenic headache   . Concussion with loss of consciousness 10/09/2017  . CTS (carpal tunnel syndrome)   . Depression   . GERD (gastroesophageal reflux disease)   . Glucose intolerance (impaired glucose tolerance)   . High coronary artery calcium score    Score of 1460.  Coronary CTA shows high-grade proximal-mid LAD stenosis (CT FFR 0.50).  Also PDA/PLA 60 to 70% stenosis (CTO FFR 0.75)  . History of kidney stones   . Hypertension   . Migraine headache    with visual aura  . Obesity   . OSA (obstructive sleep apnea)    not treated  . Postmenopausal   . Vertigo    post concussive    Past Surgical History:  Procedure Laterality Date  . ABDOMINAL HYSTERECTOMY  2003   partial- L ovary remains   . ABDOMINAL HYSTERECTOMY    . BUNIONECTOMY     right great toe  . CHOLECYSTECTOMY N/A 06/05/2018   Procedure: LAPAROSCOPIC CHOLECYSTECTOMY WITH POSSIBLE INTRAOPERATIVE CHOLANGIOGRAM;  Surgeon: Erroll Luna, MD;  Location: [facility];  Service: General;  Laterality: N/A;  . CORONARY STENT INTERVENTION N/A 05/12/2019   Procedure: CORONARY STENT INTERVENTION;  Surgeon: Leonie Man, MD;  Location: [facility];  Service: Cardiovascular;  Laterality: N/A;  . GASTRIC BYPASS  11/2015   [facility]  . HAND SURGERY    . LEFT HEART CATH AND CORONARY ANGIOGRAPHY N/A 05/12/2019   Procedure: LEFT HEART CATH AND CORONARY ANGIOGRAPHY;  Surgeon: Leonie Man, MD;  Location: [facility];  Service: Cardiovascular;  Laterality: N/A;  . TUBAL LIGATION Bilateral     Current Medications: Current Meds  Medication Sig  . aspirin EC 81 MG tablet Take 1 tablet (81 mg total) by mouth daily.  . Cholecalciferol (VITAMIN D3) 125 MCG (5000 UT) TABS Take 5,000 Units by mouth daily.  . Coenzyme Q10 (CO Q10) 200 MG CAPS Take 200 mg by mouth daily.   [name] DHEA 25 MG CAPS Take 25 mg by mouth daily.   [name] estradiol (ESTRACE) 2 MG tablet Take 2 mg by mouth daily.  . hydrochlorothiazide (MICROZIDE)  12.5 MG capsule Take 12.5 mg by mouth daily.  [name] losartan (COZAAR) 100 MG tablet Take 100 mg by mouth daily.  . Magnesium 500 MG TABS Take 500 mg by mouth daily.   . metFORMIN (GLUCOPHAGE) 500 MG tablet Take 500 mg by mouth 2 (two) times daily.  . nitroGLYCERIN (NITROSTAT) 0.4 MG SL tablet Place 1 tablet (0.4 mg total) under the tongue every 5 (five) minutes as needed.  . pantoprazole (PROTONIX) 40 MG tablet Take 40 mg by mouth as needed Jerrye Bushy).   . progesterone (PROMETRIUM) 200 MG capsule Take 200 mg by mouth at bedtime.  . rosuvastatin (CRESTOR) 20 MG tablet Take 1 tablet (20 mg total) by mouth daily.  . ticagrelor (BRILINTA) 90 MG TABS tablet Take 1 tablet (90 mg total) by mouth 2 (two) times daily.  . vitamin B-12  (CYANOCOBALAMIN) 1000 MCG tablet Take 1,000 mcg by mouth daily.     Allergies:   Ceclor [cefaclor], Tetracyclines & related, Lipitor [atorvastatin calcium], Penicillins, Amoxicillin, Ampicillin, Lisinopril, and Simvastatin   Social History   Socioeconomic History  . Marital status: Married    Spouse name: Sharis Fesperman  . Number of children: 2  . Years of education: 58  . Highest education level: Not on file  Occupational History  . Occupation: Retired Best boy: [employer]    Comment: Logistics, Clinical research associate Needs  . Financial resource strain: Not on file  . Food insecurity    Worry: Not on file    Inability: Not on file  . Transportation needs    Medical: Not on file    Non-medical: Not on file  Tobacco Use  . Smoking status: Former Smoker    Packs/day: 0.10    Years: 30.00    Pack years: 3.00    Types: Cigarettes    Quit date: 04/26/2012    Years since quitting: 7.0  . Smokeless tobacco: Never Used  Substance and Sexual Activity  . Alcohol use: Yes    Comment: occasional  . Drug use: No  . Sexual activity: Yes    Partners: Male    Birth control/protection: Surgical  Lifestyle  . Physical activity    Days per week: Not on file    Minutes per session: Not on file  . Stress: Not on file  Relationships  . Social Herbalist on phone: Not on file    Gets together: Not on file    Attends religious service: Not on file    Active member of club or organization: Not on file    Attends meetings of clubs or organizations: Not on file    Relationship status: Not on file  Other Topics Concern  . Not on file  Social History Narrative   ** Merged History Encounter **       Lives with husband Carrie Reynolds). She travels very frequently with her job and is gone most weeks out of the month. She is active and exercises when able.     Family History: The patient's family history includes Alzheimer's disease in her father and paternal  grandmother; Cancer in her father and paternal grandfather; Diabetes Mellitus II in her sister; Hyperlipidemia in her sister; Hypertension in her mother and sister; Migraines in her father.  ROS:   Please see the history of present illness.     All other systems reviewed and are negative.  EKGs/Labs/Other Studies Reviewed:    The following studies were reviewed today: Cath/ PCI  05/12/2019  EKG:  EKG is ordered today.  The ekg ordered today demonstrates NSR, SB-53 TWI V2-V3  Recent Labs: 06/05/2018: ALT 21 05/06/2019: BUN 19; Creatinine, Ser 0.91; Hemoglobin 13.6; Platelets 206; Potassium 4.1; Sodium 139  Recent Lipid Panel    Component Value Date/Time   CHOL 265 (H) 04/25/2012 1406   TRIG 379 (H) 04/25/2012 1406   HDL 48 04/25/2012 1406   CHOLHDL 5.5 04/25/2012 1406   VLDL 76 (H) 04/25/2012 1406   LDLCALC 141 (H) 04/25/2012 1406    Physical Exam:    VS:  BP 137/88   Pulse 67   Temp (!) 97.1 F (36.2 C)   Ht 5\' 7"  (1.702 m)   Wt 203 lb (92.1 kg)   SpO2 98%   BMI 31.79 kg/m     Wt Readings from Last 3 Encounters:  05/20/19 203 lb (92.1 kg)  05/12/19 200 lb (90.7 kg)  05/06/19 202 lb (91.6 kg)     GEN:  Well nourished, well developed in no acute distress HEENT: Normal NECK: No JVD; No carotid bruits LYMPHATICS: No lymphadenopathy CARDIAC: RRR, no murmurs, rubs, gallops RESPIRATORY:  Clear to auscultation without rales, wheezing or rhonchi  ABDOMEN: Soft, non-tender, non-distended MUSCULOSKELETAL:  No edema; No deformity  SKIN: Warm and dry- she has an elaborate flower tattoo on her back NEUROLOGIC:  Alert and oriented x 3 PSYCHIATRIC:  Normal affect   ASSESSMENT:    CAD S/P PCI LAD-Dx1 bifurcation PCI with DES 05/06/2019 Residual 60% PDA Normal LVF  Rapid palpitations Baseline bradycardia with exertional tachycardia and resting palpitations.  Hypertension Controlled  Dyslipidemia, goal LDL below 70 Discharged on statin Rx- she will need CMET and  lipids in 3 months  OSA on CPAP Compliant  PLAN:    Check 7 day ZIO- OK to resume activity- walking and swimming.  Keep follow up with Dr Ellyn Hack in Nov.   Medication Adjustments/Labs and Tests Ordered: Current medicines are reviewed at length with the patient today.  Concerns regarding medicines are outlined above.  Orders Placed This Encounter  Procedures  . LONG TERM MONITOR (3-14 DAYS)  . EKG 12-Lead   No orders of the defined types were placed in this encounter.   Patient Instructions  Medication Instructions:  Your physician recommends that you continue on your current medications as directed. Please refer to the Current Medication list given to you today. *If you need a refill on your cardiac medications before your next appointment, please call your pharmacy*  Lab Work: None  If you have labs (blood work) drawn today and your tests are completely normal, you will receive your results only by: [name] MyChart Message (if you have MyChart) OR . A paper copy in the mail If you have any lab test that is abnormal or we need to change your treatment, we will call you to review the results.  Testing/Procedures: Your physician has recommended that you wear a 7 DAY ZIO-PATCH monitor. The Zio patch cardiac monitor continuously records heart rhythm data for up to 14 days, this is for patients being evaluated for multiple types heart rhythms. For the first 24 hours post application, please avoid getting the Zio monitor wet in the shower or by excessive sweating during exercise. After that, feel free to carry on with regular activities. Keep soaps and lotions away from the ZIO XT Patch.  This will be mailed to you, please expect 7-10 days to receive. Monitor placed at our [facility] location - [place],  [address].         Follow-Up: At [facility], you and your health needs are our priority.  As part of our continuing mission to provide you with exceptional heart care, we have  created designated Provider Care Teams.  These Care Teams include your primary Cardiologist (physician) and Advanced Practice Providers (APPs -  Physician Assistants and Nurse Practitioners) who all work together to provide you with the care you need, when you need it.  Your next appointment:   FOLLOW UP AS SCHEDULED  The format for your next appointment:   In Person  Provider:   [name], MD  Other Instructions     Signed, Kerin Ransom, PA-C  05/20/2019 10:43 AM    [place]

## 2019-05-20 NOTE — Assessment & Plan Note (Signed)
LAD-Dx1 bifurcation PCI with DES 05/06/2019 Residual 60% PDA Normal LVF

## 2019-05-20 NOTE — Patient Instructions (Signed)
Medication Instructions:  Your physician recommends that you continue on your current medications as directed. Please refer to the Current Medication list given to you today. *If you need a refill on your cardiac medications before your next appointment, please call your pharmacy*  Lab Work: None  If you have labs (blood work) drawn today and your tests are completely normal, you will receive your results only by: [name] MyChart Message (if you have MyChart) OR . A paper copy in the mail If you have any lab test that is abnormal or we need to change your treatment, we will call you to review the results.  Testing/Procedures: Your physician has recommended that you wear a 7 DAY ZIO-PATCH monitor. The Zio patch cardiac monitor continuously records heart rhythm data for up to 14 days, this is for patients being evaluated for multiple types heart rhythms. For the first 24 hours post application, please avoid getting the Zio monitor wet in the shower or by excessive sweating during exercise. After that, feel free to carry on with regular activities. Keep soaps and lotions away from the ZIO XT Patch.  This will be mailed to you, please expect 7-10 days to receive. Monitor placed at our [facility] location - [address].         Follow-Up: At [facility], you and your health needs are our priority.  As part of our continuing mission to provide you with exceptional heart care, we have created designated Provider Care Teams.  These Care Teams include your primary Cardiologist (physician) and Advanced Practice Providers (APPs -  Physician Assistants and Nurse Practitioners) who all work together to provide you with the care you need, when you need it.  Your next appointment:   FOLLOW UP AS SCHEDULED  The format for your next appointment:   In Person  Provider:   [name], MD  Other Instructions

## 2019-05-20 NOTE — Assessment & Plan Note (Signed)
Baseline bradycardia with exertional tachycardia and resting palpitations.

## 2019-05-26 ENCOUNTER — Telehealth (HOSPITAL_COMMUNITY): Payer: Self-pay | Admitting: *Deleted

## 2019-05-26 NOTE — Telephone Encounter (Signed)
Spoke with Gwyndolyn Saxon completed health history. Confirmed orientation appointment for tomorrow.Barnet Pall, RN,BSN 05/26/2019 3:57 PM

## 2019-05-28 ENCOUNTER — Encounter (HOSPITAL_COMMUNITY): Payer: Self-pay

## 2019-05-28 ENCOUNTER — Other Ambulatory Visit: Payer: Self-pay

## 2019-05-28 ENCOUNTER — Telehealth: Payer: Self-pay | Admitting: Cardiology

## 2019-05-28 ENCOUNTER — Encounter (HOSPITAL_COMMUNITY)
Admission: RE | Admit: 2019-05-28 | Discharge: 2019-05-28 | Disposition: A | Discharge status: Home / Self Care | Payer: Managed Care, Other (non HMO) | Source: Ambulatory Visit | Attending: Cardiology | Admitting: Cardiology

## 2019-05-28 VITALS — BP 110/70 | HR 83 | Temp 97.9°F | Ht 68.0 in | Wt 201.9 lb

## 2019-05-28 DIAGNOSIS — Z955 Presence of coronary angioplasty implant and graft: Secondary | ICD-10-CM

## 2019-05-28 LAB — GLUCOSE, CAPILLARY: Glucose-Capillary: 90 mg/dL (ref 70–99)

## 2019-05-28 NOTE — Telephone Encounter (Signed)
7 day ZIO XT long term holter monitor to be mailed to patients [address] via priority mail.  She should receive the monitor in 3-5 days.  Instructions reviewed briefly as they are included in the monitor kit.  [employer] contact number [phone].

## 2019-05-28 NOTE — Progress Notes (Signed)
Cardiac Individual Treatment Plan  Patient Details  Name: Carrie Reynolds MRN: [id number] Date of Birth: 18-May-1956 Referring Provider:     CARDIAC REHAB PHASE II ORIENTATION from 05/28/2019 in [place]  Referring Provider  Leonie Man       Initial Encounter Date:    CARDIAC REHAB PHASE II ORIENTATION from 05/28/2019 in [place]  Date  05/28/19      Visit Diagnosis: Status post coronary artery stent placement S/P DES LAD 05/12/19  Patient's Home Medications on Admission:  Current Outpatient Medications:  .  aspirin EC 81 MG tablet, Take 1 tablet (81 mg total) by mouth daily., Disp: 90 tablet, Rfl: 3 .  Cholecalciferol (VITAMIN D3) 125 MCG (5000 UT) TABS, Take 5,000 Units by mouth 2 (two) times daily. , Disp: , Rfl:  .  Coenzyme Q10 (CO Q10) 200 MG CAPS, Take 400 mg by mouth daily. , Disp: , Rfl:  .  DHEA 25 MG CAPS, Take 25 mg by mouth daily. , Disp: , Rfl:  .  estradiol (ESTRACE) 2 MG tablet, Take 2 mg by mouth daily., Disp: , Rfl:  .  ibuprofen (ADVIL) 800 MG tablet, Take 800 mg by mouth daily as needed for headache. Takes 1-2 times weekly, Disp: , Rfl:  .  losartan-hydrochlorothiazide (HYZAAR) 100-12.5 MG tablet, Take 1 tablet by mouth daily., Disp: , Rfl:  .  Magnesium 250 MG TABS, Take 250 mg by mouth daily. , Disp: , Rfl:  .  metFORMIN (GLUCOPHAGE) 500 MG tablet, Take 500 mg by mouth 2 (two) times daily., Disp: , Rfl:  .  Multiple Vitamins-Minerals (MULTIVITAMIN WOMEN PO), Take 1 tablet by mouth daily., Disp: , Rfl:  .  nitroGLYCERIN (NITROSTAT) 0.4 MG SL tablet, Place 1 tablet (0.4 mg total) under the tongue every 5 (five) minutes as needed., Disp: 25 tablet, Rfl: 2 .  pantoprazole (PROTONIX) 40 MG tablet, Take 40 mg by mouth as needed Jerrye Bushy). , Disp: , Rfl:  .  progesterone (PROMETRIUM) 200 MG capsule, Take 200 mg by mouth at bedtime., Disp: , Rfl:  .  rosuvastatin (CRESTOR) 20 MG tablet, Take 1  tablet (20 mg total) by mouth daily., Disp: 30 tablet, Rfl: 5 .  ticagrelor (BRILINTA) 90 MG TABS tablet, Take 1 tablet (90 mg total) by mouth 2 (two) times daily., Disp: 60 tablet, Rfl: 0 .  vitamin B-12 (CYANOCOBALAMIN) 1000 MCG tablet, Take 1,000 mcg by mouth daily., Disp: , Rfl:   Past Medical History: Past Medical History:  Diagnosis Date  . Anxiety   . Arnold-Chiari malformation ([place]) 2006  . Cervical strain    syndrome  . Cervicogenic headache   . Concussion with loss of consciousness 10/09/2017  . CTS (carpal tunnel syndrome)   . Depression   . GERD (gastroesophageal reflux disease)   . Glucose intolerance (impaired glucose tolerance)   . High coronary artery calcium score    Score of 1460.  Coronary CTA shows high-grade proximal-mid LAD stenosis (CT FFR 0.50).  Also PDA/PLA 60 to 70% stenosis (CTO FFR 0.75)  . History of kidney stones   . Hypertension   . Migraine headache    with visual aura  . Obesity   . OSA (obstructive sleep apnea)    not treated  . Postmenopausal   . Vertigo    post concussive    Tobacco Use: Social History   Tobacco Use  Smoking Status Former Smoker  . Packs/day: 0.10  .  Years: 30.00  . Pack years: 3.00  . Types: Cigarettes  . Quit date: 04/26/2012  . Years since quitting: 7.0  Smokeless Tobacco Never Used    Labs: Recent Review Scientist, physiological    Labs for ITP Cardiac and Pulmonary Rehab Latest Ref Rng & Units 04/25/2012 10/17/2017   Cholestrol 0 - 200 mg/dL 265(H) -   LDLCALC 0 - 99 mg/dL 141(H) -   HDL >39 mg/dL 48 -   Trlycerides <150 mg/dL 379(H) -   TCO2 22 - 32 mmol/L - 23      Capillary Blood Glucose: Lab Results  Component Value Date   GLUCAP 90 05/28/2019     Exercise Target Goals: Exercise Program Goal: Individual exercise prescription set using results from initial 6 min walk test and THRR while considering  patient's activity barriers and safety.   Exercise Prescription Goal: Starting with aerobic activity 30  plus minutes a day, 3 days per week for initial exercise prescription. Provide home exercise prescription and guidelines that participant acknowledges understanding prior to discharge.  Activity Barriers & Risk Stratification: Activity Barriers & Cardiac Risk Stratification - 05/28/19 1105      Activity Barriers & Cardiac Risk Stratification   Activity Barriers  Balance Concerns;History of Falls    Cardiac Risk Stratification  Moderate       6 Minute Walk: 6 Minute Walk    Row Name 05/28/19 1102         6 Minute Walk   Phase  Initial     Distance  1350 feet     Walk Time  6 minutes     # of Rest Breaks  0     MPH  2.56     METS  3.33     RPE  11     Perceived Dyspnea   0     VO2 Peak  11.6     Symptoms  No     Resting HR  83 bpm     Resting BP  110/70     Resting Oxygen Saturation   99 %     Max Ex. HR  100 bpm     Max Ex. BP  134/82     2 Minute Post BP  116/80        Oxygen Initial Assessment:   Oxygen Re-Evaluation:   Oxygen Discharge (Final Oxygen Re-Evaluation):   Initial Exercise Prescription: Initial Exercise Prescription - 05/28/19 1100      Date of Initial Exercise RX and Referring Provider   Date  05/28/19    Referring Provider  Leonie Man     Expected Discharge Date  07/22/19      Recumbant Bike   Level  2    Watts  10    Minutes  15    METs  2.3      NuStep   Level  2    SPM  85    Minutes  15    METs  2.3      Prescription Details   Frequency (times per week)  3x    Duration  Progress to 30 minutes of continuous aerobic without signs/symptoms of physical distress      Intensity   THRR 40-80% of Max Heartrate  64-127    Ratings of Perceived Exertion  11-13    Perceived Dyspnea  0-4      Progression   Progression  Continue progressive overload as per policy without signs/symptoms or physical distress.  Resistance Training   Training Prescription  Yes    Weight  3lbs    Reps  10-15       Perform Capillary Blood  Glucose checks as needed.  Exercise Prescription Changes:   Exercise Comments:   Exercise Goals and Review:  Exercise Goals    Row Name 05/28/19 1115             Exercise Goals   Increase Physical Activity  Yes       Intervention  Provide advice, education, support and counseling about physical activity/exercise needs.;Develop an individualized exercise prescription for aerobic and resistive training based on initial evaluation findings, risk stratification, comorbidities and participant's personal goals.       Expected Outcomes  Short Term: Attend rehab on a regular basis to increase amount of physical activity.;Long Term: Add in home exercise to make exercise part of routine and to increase amount of physical activity.;Long Term: Exercising regularly at least 3-5 days a week.       Increase Strength and Stamina  Yes       Intervention  Develop an individualized exercise prescription for aerobic and resistive training based on initial evaluation findings, risk stratification, comorbidities and participant's personal goals.;Provide advice, education, support and counseling about physical activity/exercise needs.       Expected Outcomes  Short Term: Increase workloads from initial exercise prescription for resistance, speed, and METs.;Short Term: Perform resistance training exercises routinely during rehab and add in resistance training at home;Long Term: Improve cardiorespiratory fitness, muscular endurance and strength as measured by increased METs and functional capacity (6MWT)       Able to understand and use rate of perceived exertion (RPE) scale  Yes       Intervention  Provide education and explanation on how to use RPE scale       Expected Outcomes  Short Term: Able to use RPE daily in rehab to express subjective intensity level;Long Term:  Able to use RPE to guide intensity level when exercising independently       Knowledge and understanding of Target Heart Rate Range (THRR)  Yes        Intervention  Provide education and explanation of THRR including how the numbers were predicted and where they are located for reference       Expected Outcomes  Short Term: Able to state/look up THRR;Long Term: Able to use THRR to govern intensity when exercising independently;Short Term: Able to use daily as guideline for intensity in rehab       Able to check pulse independently  Yes       Intervention  Provide education and demonstration on how to check pulse in carotid and radial arteries.;Review the importance of being able to check your own pulse for safety during independent exercise       Expected Outcomes  Short Term: Able to explain why pulse checking is important during independent exercise;Long Term: Able to check pulse independently and accurately       Understanding of Exercise Prescription  Yes       Intervention  Provide education, explanation, and written materials on patient's individual exercise prescription       Expected Outcomes  Short Term: Able to explain program exercise prescription;Long Term: Able to explain home exercise prescription to exercise independently          Exercise Goals Re-Evaluation :    Discharge Exercise Prescription (Final Exercise Prescription Changes):   Nutrition:  Target Goals: Understanding of nutrition guidelines,  daily intake of sodium 1500mg , cholesterol 200mg , calories 30% from fat and 7% or less from saturated fats, daily to have 5 or more servings of fruits and vegetables.  Biometrics: Pre Biometrics - 05/28/19 1106      Pre Biometrics   Height  5\' 8"  (1.727 m)    Weight  91.6 kg    Waist Circumference  43.5 inches    Hip Circumference  44 inches    Waist to Hip Ratio  0.99 %    BMI (Calculated)  30.71    Triceps Skinfold  21 mm    % Body Fat  40.9 %    Grip Strength  31 kg    Flexibility  20.5 in    Single Leg Stand  30 seconds        Nutrition Therapy Plan and Nutrition Goals:   Nutrition  Assessments:   Nutrition Goals Re-Evaluation:   Nutrition Goals Discharge (Final Nutrition Goals Re-Evaluation):   Psychosocial: Target Goals: Acknowledge presence or absence of significant depression and/or stress, maximize coping skills, provide positive support system. Participant is able to verbalize types and ability to use techniques and skills needed for reducing stress and depression.  Initial Review & Psychosocial Screening: Initial Psych Review & Screening - 05/28/19 1131      Initial Review   Current issues with  History of Depression;Current Stress Concerns    Source of Stress Concerns  Chronic Illness   Due to recent cardiac events and concussion last year     Family Dynamics   Good Support System?  Yes   Dynastie has her husband and daughter for support     Barriers   Psychosocial barriers to participate in program  There are no identifiable barriers or psychosocial needs.      Screening Interventions   Interventions  Encouraged to exercise       Quality of Life Scores: Quality of Life - 05/28/19 1059      Quality of Life   Select  Quality of Life      Quality of Life Scores   Health/Function Pre  18.63 %    Socioeconomic Pre  27.86 %    Psych/Spiritual Pre  25.36 %    Family Pre  29.5 %    GLOBAL Pre  23.51 %      Scores of 19 and below usually indicate a poorer quality of life in these areas.  A difference of  2-3 points is a clinically meaningful difference.  A difference of 2-3 points in the total score of the Quality of Life Index has been associated with significant improvement in overall quality of life, self-image, physical symptoms, and general health in studies assessing change in quality of life.  PHQ-9: Recent Review Flowsheet Data    Depression screen [facility] 2/9 04/08/2017   Decreased Interest 0   Down, Depressed, Hopeless 0   PHQ - 2 Score 0     Interpretation of Total Score  Total Score Depression Severity:  1-4 = Minimal depression, 5-9  = Mild depression, 10-14 = Moderate depression, 15-19 = Moderately severe depression, 20-27 = Severe depression   Psychosocial Evaluation and Intervention:   Psychosocial Re-Evaluation:   Psychosocial Discharge (Final Psychosocial Re-Evaluation):   Vocational Rehabilitation: Provide vocational rehab assistance to qualifying candidates.   Vocational Rehab Evaluation & Intervention: Vocational Rehab - 05/28/19 1132      Initial Vocational Rehab Evaluation & Intervention   Assessment shows need for Vocational Rehabilitation  No  Education: Education Goals: Education classes will be provided on a weekly basis, covering required topics. Participant will state understanding/return demonstration of topics presented.  Learning Barriers/Preferences: Learning Barriers/Preferences - 05/28/19 1109      Learning Barriers/Preferences   Learning Barriers  None    Learning Preferences  Written Material;Skilled Demonstration;Individual Instruction       Education Topics: Hypertension, Hypertension Reduction -Define heart disease and high blood pressure. Discus how high blood pressure affects the body and ways to reduce high blood pressure.   Exercise and Your Heart -Discuss why it is important to exercise, the FITT principles of exercise, normal and abnormal responses to exercise, and how to exercise safely.   Angina -Discuss definition of angina, causes of angina, treatment of angina, and how to decrease risk of having angina.   Cardiac Medications -Review what the following cardiac medications are used for, how they affect the body, and side effects that may occur when taking the medications.  Medications include Aspirin, Beta blockers, calcium channel blockers, ACE Inhibitors, angiotensin receptor blockers, diuretics, digoxin, and antihyperlipidemics.   Congestive Heart Failure -Discuss the definition of CHF, how to live with CHF, the signs and symptoms of CHF, and how keep  track of weight and sodium intake.   Heart Disease and Intimacy -Discus the effect sexual activity has on the heart, how changes occur during intimacy as we age, and safety during sexual activity.   Smoking Cessation / COPD -Discuss different methods to quit smoking, the health benefits of quitting smoking, and the definition of COPD.   Nutrition I: Fats -Discuss the types of cholesterol, what cholesterol does to the heart, and how cholesterol levels can be controlled.   Nutrition II: Labels -Discuss the different components of food labels and how to read food label   Heart Parts/Heart Disease and PAD -Discuss the anatomy of the heart, the pathway of blood circulation through the heart, and these are affected by heart disease.   Stress I: Signs and Symptoms -Discuss the causes of stress, how stress may lead to anxiety and depression, and ways to limit stress.   Stress II: Relaxation -Discuss different types of relaxation techniques to limit stress.   Warning Signs of Stroke / TIA -Discuss definition of a stroke, what the signs and symptoms are of a stroke, and how to identify when someone is having stroke.   Knowledge Questionnaire Score: Knowledge Questionnaire Score - 05/28/19 1059      Knowledge Questionnaire Score   Pre Score  21/24       Core Components/Risk Factors/Patient Goals at Admission: Personal Goals and Risk Factors at Admission - 05/28/19 1114      Core Components/Risk Factors/Patient Goals on Admission    Weight Management  Yes;Weight Loss    Intervention  Weight Management: Develop a combined nutrition and exercise program designed to reach desired caloric intake, while maintaining appropriate intake of nutrient and fiber, sodium and fats, and appropriate energy expenditure required for the weight goal.;Weight Management: Provide education and appropriate resources to help participant work on and attain dietary goals.;Weight Management/Obesity:  Establish reasonable short term and long term weight goals.    Admit Weight  201 lb (91.2 kg)    Goal Weight: Short Term  190 lb (86.2 kg)    Goal Weight: Long Term  180 lb (81.6 kg)    Expected Outcomes  Short Term: Continue to assess and modify interventions until short term weight is achieved;Long Term: Adherence to nutrition and physical activity/exercise program aimed toward  attainment of established weight goal;Weight Loss: Understanding of general recommendations for a balanced deficit meal plan, which promotes 1-2 lb weight loss per week and includes a negative energy balance of [id number] kcal/d;Understanding recommendations for meals to include 15-35% energy as protein, 25-35% energy from fat, 35-60% energy from carbohydrates, less than 200mg  of dietary cholesterol, 20-35 gm of total fiber daily;Understanding of distribution of calorie intake throughout the day with the consumption of 4-5 meals/snacks    Hypertension  Yes    Intervention  Provide education on lifestyle modifcations including regular physical activity/exercise, weight management, moderate sodium restriction and increased consumption of fresh fruit, vegetables, and low fat dairy, alcohol moderation, and smoking cessation.;Monitor prescription use compliance.    Expected Outcomes  Short Term: Continued assessment and intervention until BP is < 140/104mm HG in hypertensive participants. < 130/73mm HG in hypertensive participants with diabetes, heart failure or chronic kidney disease.;Long Term: Maintenance of blood pressure at goal levels.    Lipids  Yes    Intervention  Provide education and support for participant on nutrition & aerobic/resistive exercise along with prescribed medications to achieve LDL 70mg , HDL >40mg .    Expected Outcomes  Short Term: Participant states understanding of desired cholesterol values and is compliant with medications prescribed. Participant is following exercise prescription and nutrition  guidelines.;Long Term: Cholesterol controlled with medications as prescribed, with individualized exercise RX and with personalized nutrition plan. Value goals: LDL < 70mg , HDL > 40 mg.    Stress  Yes    Intervention  Offer individual and/or small group education and counseling on adjustment to heart disease, stress management and health-related lifestyle change. Teach and support self-help strategies.;Refer participants experiencing significant psychosocial distress to appropriate mental health specialists for further evaluation and treatment. When possible, include family members and significant others in education/counseling sessions.    Expected Outcomes  Short Term: Participant demonstrates changes in health-related behavior, relaxation and other stress management skills, ability to obtain effective social support, and compliance with psychotropic medications if prescribed.;Long Term: Emotional wellbeing is indicated by absence of clinically significant psychosocial distress or social isolation.       Core Components/Risk Factors/Patient Goals Review:    Core Components/Risk Factors/Patient Goals at Discharge (Final Review):    ITP Comments: ITP Comments    Row Name 05/28/19 0949           ITP Comments  Dr. Fransico Him, Medical Director          Comments:Mette attended orientation on 05/28/2019 to review rules and guidelines for program.  Completed 6 minute walk test, Intitial ITP, and exercise prescription.  VSS. Entry blood pressure 110/70. Heart rate 83. Telemetry-Sinus intermittent PVC's occasional nonsustained runs of bigeminey .  Asymptomatic during walk test. Patient did report that she felt a fluttering at times since waking up this morning. Patient was placed on the [place] after walk test .Intermitent PVC's noted which patient says that she can feel. Cecilie Kicks NP called and notified. Mickel Baas instructed Aaila to eat a banana daily for the next few days and contacted Dr Allison Quarry  office  about getting a monitor placed as the patient has not been contacted about receiving a heart monitor yet. Safety measures and social distancing in place per CDC guidelines.Patient left cardiac rehab without complaints.Will fax exercise flow sheets to Dr. Allison Quarry  office for review with today's ECG tracings.Barnet Pall, RN,BSN 05/28/2019 12:47 PM

## 2019-05-28 NOTE — Telephone Encounter (Signed)
Pt having PVCs on occ at cardiac rehab.  She is aware of them.  Not on BB due to brady she has not yet rec'd monitor to wear.  Will send to [facility].  Keep follow up with Dr. Ellyn Hack.  BP was stable and no chest pain.

## 2019-06-01 ENCOUNTER — Telehealth (HOSPITAL_COMMUNITY): Payer: Self-pay | Admitting: Family Medicine

## 2019-06-01 ENCOUNTER — Ambulatory Visit (HOSPITAL_COMMUNITY): Payer: Medicaid Other

## 2019-06-03 ENCOUNTER — Ambulatory Visit (HOSPITAL_COMMUNITY): Payer: Medicaid Other

## 2019-06-04 ENCOUNTER — Ambulatory Visit (INDEPENDENT_AMBULATORY_CARE_PROVIDER_SITE_OTHER): Payer: Managed Care, Other (non HMO)

## 2019-06-04 ENCOUNTER — Encounter (HOSPITAL_COMMUNITY): Payer: Self-pay | Admitting: *Deleted

## 2019-06-04 DIAGNOSIS — Z955 Presence of coronary angioplasty implant and graft: Secondary | ICD-10-CM

## 2019-06-04 DIAGNOSIS — R002 Palpitations: Secondary | ICD-10-CM | POA: Diagnosis not present

## 2019-06-04 NOTE — Progress Notes (Signed)
Cardiac Individual Treatment Plan  Patient Details  Name: Carrie Reynolds MRN: [id number] Date of Birth: [birth year] Referring Provider:     CARDIAC REHAB PHASE II ORIENTATION from 05/28/2019 in [place]  Referring Provider  Leonie Man       Initial Encounter Date:    CARDIAC REHAB PHASE II ORIENTATION from 05/28/2019 in [place]  Date  05/28/19      Visit Diagnosis: Status post coronary artery stent placement S/P DES LAD 05/12/19  Patient's Home Medications on Admission:  Current Outpatient Medications:  .  aspirin EC 81 MG tablet, Take 1 tablet (81 mg total) by mouth daily., Disp: 90 tablet, Rfl: 3 .  Cholecalciferol (VITAMIN D3) 125 MCG (5000 UT) TABS, Take 5,000 Units by mouth 2 (two) times daily. , Disp: , Rfl:  .  Coenzyme Q10 (CO Q10) 200 MG CAPS, Take 400 mg by mouth daily. , Disp: , Rfl:  .  DHEA 25 MG CAPS, Take 25 mg by mouth daily. , Disp: , Rfl:  .  estradiol (ESTRACE) 2 MG tablet, Take 2 mg by mouth daily., Disp: , Rfl:  .  ibuprofen (ADVIL) 800 MG tablet, Take 800 mg by mouth daily as needed for headache. Takes 1-2 times weekly, Disp: , Rfl:  .  losartan-hydrochlorothiazide (HYZAAR) 100-12.5 MG tablet, Take 1 tablet by mouth daily., Disp: , Rfl:  .  Magnesium 250 MG TABS, Take 250 mg by mouth daily. , Disp: , Rfl:  .  metFORMIN (GLUCOPHAGE) 500 MG tablet, Take 500 mg by mouth 2 (two) times daily., Disp: , Rfl:  .  Multiple Vitamins-Minerals (MULTIVITAMIN WOMEN PO), Take 1 tablet by mouth daily., Disp: , Rfl:  .  nitroGLYCERIN (NITROSTAT) 0.4 MG SL tablet, Place 1 tablet (0.4 mg total) under the tongue every 5 (five) minutes as needed., Disp: 25 tablet, Rfl: 2 .  pantoprazole (PROTONIX) 40 MG tablet, Take 40 mg by mouth as needed Jerrye Bushy). , Disp: , Rfl:  .  progesterone (PROMETRIUM) 200 MG capsule, Take 200 mg by mouth at bedtime., Disp: , Rfl:  .  rosuvastatin (CRESTOR) 20 MG tablet, Take 1  tablet (20 mg total) by mouth daily., Disp: 30 tablet, Rfl: 5 .  ticagrelor (BRILINTA) 90 MG TABS tablet, Take 1 tablet (90 mg total) by mouth 2 (two) times daily., Disp: 60 tablet, Rfl: 0 .  vitamin B-12 (CYANOCOBALAMIN) 1000 MCG tablet, Take 1,000 mcg by mouth daily., Disp: , Rfl:   Past Medical History: Past Medical History:  Diagnosis Date  . Anxiety   . Arnold-Chiari malformation ([place]) 2006  . Cervical strain    syndrome  . Cervicogenic headache   . Concussion with loss of consciousness 10/09/2017  . CTS (carpal tunnel syndrome)   . Depression   . GERD (gastroesophageal reflux disease)   . Glucose intolerance (impaired glucose tolerance)   . High coronary artery calcium score    Score of 1460.  Coronary CTA shows high-grade proximal-mid LAD stenosis (CT FFR 0.50).  Also PDA/PLA 60 to 70% stenosis (CTO FFR 0.75)  . History of kidney stones   . Hypertension   . Migraine headache    with visual aura  . Obesity   . OSA (obstructive sleep apnea)    not treated  . Postmenopausal   . Vertigo    post concussive    Tobacco Use: Social History   Tobacco Use  Smoking Status Former Smoker  . Packs/day: 0.10  .  Years: 30.00  . Pack years: 3.00  . Types: Cigarettes  . Quit date: 04/26/2012  . Years since quitting: 7.1  Smokeless Tobacco Never Used    Labs: Recent Review Scientist, physiological    Labs for ITP Cardiac and Pulmonary Rehab Latest Ref Rng & Units 04/25/2012 10/17/2017   Cholestrol 0 - 200 mg/dL 265(H) -   LDLCALC 0 - 99 mg/dL 141(H) -   HDL >39 mg/dL 48 -   Trlycerides <150 mg/dL 379(H) -   TCO2 22 - 32 mmol/L - 23      Capillary Blood Glucose: Lab Results  Component Value Date   GLUCAP 90 05/28/2019     Exercise Target Goals: Exercise Program Goal: Individual exercise prescription set using results from initial 6 min walk test and THRR while considering  patient's activity barriers and safety.   Exercise Prescription Goal: Starting with aerobic activity 30  plus minutes a day, 3 days per week for initial exercise prescription. Provide home exercise prescription and guidelines that participant acknowledges understanding prior to discharge.  Activity Barriers & Risk Stratification: Activity Barriers & Cardiac Risk Stratification - 05/28/19 1105      Activity Barriers & Cardiac Risk Stratification   Activity Barriers  Balance Concerns;History of Falls    Cardiac Risk Stratification  Moderate       6 Minute Walk: 6 Minute Walk    Row Name 05/28/19 1102         6 Minute Walk   Phase  Initial     Distance  1350 feet     Walk Time  6 minutes     # of Rest Breaks  0     MPH  2.56     METS  3.33     RPE  11     Perceived Dyspnea   0     VO2 Peak  11.6     Symptoms  No     Resting HR  83 bpm     Resting BP  110/70     Resting Oxygen Saturation   99 %     Max Ex. HR  100 bpm     Max Ex. BP  134/82     2 Minute Post BP  116/80        Oxygen Initial Assessment:   Oxygen Re-Evaluation:   Oxygen Discharge (Final Oxygen Re-Evaluation):   Initial Exercise Prescription: Initial Exercise Prescription - 05/28/19 1100      Date of Initial Exercise RX and Referring Provider   Date  05/28/19    Referring Provider  Leonie Man     Expected Discharge Date  07/22/19      Recumbant Bike   Level  2    Watts  10    Minutes  15    METs  2.3      NuStep   Level  2    SPM  85    Minutes  15    METs  2.3      Prescription Details   Frequency (times per week)  3x    Duration  Progress to 30 minutes of continuous aerobic without signs/symptoms of physical distress      Intensity   THRR 40-80% of Max Heartrate  64-127    Ratings of Perceived Exertion  11-13    Perceived Dyspnea  0-4      Progression   Progression  Continue progressive overload as per policy without signs/symptoms or physical distress.  Resistance Training   Training Prescription  Yes    Weight  3lbs    Reps  10-15       Perform Capillary Blood  Glucose checks as needed.  Exercise Prescription Changes:   Exercise Comments:   Exercise Goals and Review: Exercise Goals    Row Name 05/28/19 1115             Exercise Goals   Increase Physical Activity  Yes       Intervention  Provide advice, education, support and counseling about physical activity/exercise needs.;Develop an individualized exercise prescription for aerobic and resistive training based on initial evaluation findings, risk stratification, comorbidities and participant's personal goals.       Expected Outcomes  Short Term: Attend rehab on a regular basis to increase amount of physical activity.;Long Term: Add in home exercise to make exercise part of routine and to increase amount of physical activity.;Long Term: Exercising regularly at least 3-5 days a week.       Increase Strength and Stamina  Yes       Intervention  Develop an individualized exercise prescription for aerobic and resistive training based on initial evaluation findings, risk stratification, comorbidities and participant's personal goals.;Provide advice, education, support and counseling about physical activity/exercise needs.       Expected Outcomes  Short Term: Increase workloads from initial exercise prescription for resistance, speed, and METs.;Short Term: Perform resistance training exercises routinely during rehab and add in resistance training at home;Long Term: Improve cardiorespiratory fitness, muscular endurance and strength as measured by increased METs and functional capacity (6MWT)       Able to understand and use rate of perceived exertion (RPE) scale  Yes       Intervention  Provide education and explanation on how to use RPE scale       Expected Outcomes  Short Term: Able to use RPE daily in rehab to express subjective intensity level;Long Term:  Able to use RPE to guide intensity level when exercising independently       Knowledge and understanding of Target Heart Rate Range (THRR)  Yes        Intervention  Provide education and explanation of THRR including how the numbers were predicted and where they are located for reference       Expected Outcomes  Short Term: Able to state/look up THRR;Long Term: Able to use THRR to govern intensity when exercising independently;Short Term: Able to use daily as guideline for intensity in rehab       Able to check pulse independently  Yes       Intervention  Provide education and demonstration on how to check pulse in carotid and radial arteries.;Review the importance of being able to check your own pulse for safety during independent exercise       Expected Outcomes  Short Term: Able to explain why pulse checking is important during independent exercise;Long Term: Able to check pulse independently and accurately       Understanding of Exercise Prescription  Yes       Intervention  Provide education, explanation, and written materials on patient's individual exercise prescription       Expected Outcomes  Short Term: Able to explain program exercise prescription;Long Term: Able to explain home exercise prescription to exercise independently          Exercise Goals Re-Evaluation :    Discharge Exercise Prescription (Final Exercise Prescription Changes):   Nutrition:  Target Goals: Understanding of nutrition guidelines, daily  intake of sodium 1500mg , cholesterol 200mg , calories 30% from fat and 7% or less from saturated fats, daily to have 5 or more servings of fruits and vegetables.  Biometrics: Pre Biometrics - 05/28/19 1106      Pre Biometrics   Height  5\' 8"  (1.727 m)    Weight  201 lb 15.1 oz (91.6 kg)    Waist Circumference  43.5 inches    Hip Circumference  44 inches    Waist to Hip Ratio  0.99 %    BMI (Calculated)  30.71    Triceps Skinfold  21 mm    % Body Fat  40.9 %    Grip Strength  31 kg    Flexibility  20.5 in    Single Leg Stand  30 seconds        Nutrition Therapy Plan and Nutrition Goals:   Nutrition  Assessments:   Nutrition Goals Re-Evaluation:   Nutrition Goals Discharge (Final Nutrition Goals Re-Evaluation):   Psychosocial: Target Goals: Acknowledge presence or absence of significant depression and/or stress, maximize coping skills, provide positive support system. Participant is able to verbalize types and ability to use techniques and skills needed for reducing stress and depression.  Initial Review & Psychosocial Screening: Initial Psych Review & Screening - 05/28/19 1131      Initial Review   Current issues with  History of Depression;Current Stress Concerns    Source of Stress Concerns  Chronic Illness   Due to recent cardiac events and concussion last year     Family Dynamics   Good Support System?  Yes   Briante has her husband and daughter for support     Barriers   Psychosocial barriers to participate in program  There are no identifiable barriers or psychosocial needs.      Screening Interventions   Interventions  Encouraged to exercise       Quality of Life Scores: Quality of Life - 05/28/19 1059      Quality of Life   Select  Quality of Life      Quality of Life Scores   Health/Function Pre  18.63 %    Socioeconomic Pre  27.86 %    Psych/Spiritual Pre  25.36 %    Family Pre  29.5 %    GLOBAL Pre  23.51 %      Scores of 19 and below usually indicate a poorer quality of life in these areas.  A difference of  2-3 points is a clinically meaningful difference.  A difference of 2-3 points in the total score of the Quality of Life Index has been associated with significant improvement in overall quality of life, self-image, physical symptoms, and general health in studies assessing change in quality of life.  PHQ-9: Recent Review Flowsheet Data    Depression screen [facility] 2/9 04/08/2017   Decreased Interest 0   Down, Depressed, Hopeless 0   PHQ - 2 Score 0     Interpretation of Total Score  Total Score Depression Severity:  1-4 = Minimal depression, 5-9  = Mild depression, 10-14 = Moderate depression, 15-19 = Moderately severe depression, 20-27 = Severe depression   Psychosocial Evaluation and Intervention:   Psychosocial Re-Evaluation:   Psychosocial Discharge (Final Psychosocial Re-Evaluation):   Vocational Rehabilitation: Provide vocational rehab assistance to qualifying candidates.   Vocational Rehab Evaluation & Intervention: Vocational Rehab - 05/28/19 1132      Initial Vocational Rehab Evaluation & Intervention   Assessment shows need for Vocational Rehabilitation  No  Education: Education Goals: Education classes will be provided on a weekly basis, covering required topics. Participant will state understanding/return demonstration of topics presented.  Learning Barriers/Preferences: Learning Barriers/Preferences - 05/28/19 1109      Learning Barriers/Preferences   Learning Barriers  None    Learning Preferences  Written Material;Skilled Demonstration;Individual Instruction       Education Topics: Hypertension, Hypertension Reduction -Define heart disease and high blood pressure. Discus how high blood pressure affects the body and ways to reduce high blood pressure.   Exercise and Your Heart -Discuss why it is important to exercise, the FITT principles of exercise, normal and abnormal responses to exercise, and how to exercise safely.   Angina -Discuss definition of angina, causes of angina, treatment of angina, and how to decrease risk of having angina.   Cardiac Medications -Review what the following cardiac medications are used for, how they affect the body, and side effects that may occur when taking the medications.  Medications include Aspirin, Beta blockers, calcium channel blockers, ACE Inhibitors, angiotensin receptor blockers, diuretics, digoxin, and antihyperlipidemics.   Congestive Heart Failure -Discuss the definition of CHF, how to live with CHF, the signs and symptoms of CHF, and how keep  track of weight and sodium intake.   Heart Disease and Intimacy -Discus the effect sexual activity has on the heart, how changes occur during intimacy as we age, and safety during sexual activity.   Smoking Cessation / COPD -Discuss different methods to quit smoking, the health benefits of quitting smoking, and the definition of COPD.   Nutrition I: Fats -Discuss the types of cholesterol, what cholesterol does to the heart, and how cholesterol levels can be controlled.   Nutrition II: Labels -Discuss the different components of food labels and how to read food label   Heart Parts/Heart Disease and PAD -Discuss the anatomy of the heart, the pathway of blood circulation through the heart, and these are affected by heart disease.   Stress I: Signs and Symptoms -Discuss the causes of stress, how stress may lead to anxiety and depression, and ways to limit stress.   Stress II: Relaxation -Discuss different types of relaxation techniques to limit stress.   Warning Signs of Stroke / TIA -Discuss definition of a stroke, what the signs and symptoms are of a stroke, and how to identify when someone is having stroke.   Knowledge Questionnaire Score: Knowledge Questionnaire Score - 05/28/19 1059      Knowledge Questionnaire Score   Pre Score  21/24       Core Components/Risk Factors/Patient Goals at Admission: Personal Goals and Risk Factors at Admission - 05/28/19 1114      Core Components/Risk Factors/Patient Goals on Admission    Weight Management  Yes;Weight Loss    Intervention  Weight Management: Develop a combined nutrition and exercise program designed to reach desired caloric intake, while maintaining appropriate intake of nutrient and fiber, sodium and fats, and appropriate energy expenditure required for the weight goal.;Weight Management: Provide education and appropriate resources to help participant work on and attain dietary goals.;Weight Management/Obesity:  Establish reasonable short term and long term weight goals.    Admit Weight  201 lb (91.2 kg)    Goal Weight: Short Term  190 lb (86.2 kg)    Goal Weight: Long Term  180 lb (81.6 kg)    Expected Outcomes  Short Term: Continue to assess and modify interventions until short term weight is achieved;Long Term: Adherence to nutrition and physical activity/exercise program aimed toward  attainment of established weight goal;Weight Loss: Understanding of general recommendations for a balanced deficit meal plan, which promotes 1-2 lb weight loss per week and includes a negative energy balance of [phone] kcal/d;Understanding recommendations for meals to include 15-35% energy as protein, 25-35% energy from fat, 35-60% energy from carbohydrates, less than 200mg  of dietary cholesterol, 20-35 gm of total fiber daily;Understanding of distribution of calorie intake throughout the day with the consumption of 4-5 meals/snacks    Hypertension  Yes    Intervention  Provide education on lifestyle modifcations including regular physical activity/exercise, weight management, moderate sodium restriction and increased consumption of fresh fruit, vegetables, and low fat dairy, alcohol moderation, and smoking cessation.;Monitor prescription use compliance.    Expected Outcomes  Short Term: Continued assessment and intervention until BP is < 140/85mm HG in hypertensive participants. < 130/29mm HG in hypertensive participants with diabetes, heart failure or chronic kidney disease.;Long Term: Maintenance of blood pressure at goal levels.    Lipids  Yes    Intervention  Provide education and support for participant on nutrition & aerobic/resistive exercise along with prescribed medications to achieve LDL 70mg , HDL >40mg .    Expected Outcomes  Short Term: Participant states understanding of desired cholesterol values and is compliant with medications prescribed. Participant is following exercise prescription and nutrition  guidelines.;Long Term: Cholesterol controlled with medications as prescribed, with individualized exercise RX and with personalized nutrition plan. Value goals: LDL < 70mg , HDL > 40 mg.    Stress  Yes    Intervention  Offer individual and/or small group education and counseling on adjustment to heart disease, stress management and health-related lifestyle change. Teach and support self-help strategies.;Refer participants experiencing significant psychosocial distress to appropriate mental health specialists for further evaluation and treatment. When possible, include family members and significant others in education/counseling sessions.    Expected Outcomes  Short Term: Participant demonstrates changes in health-related behavior, relaxation and other stress management skills, ability to obtain effective social support, and compliance with psychotropic medications if prescribed.;Long Term: Emotional wellbeing is indicated by absence of clinically significant psychosocial distress or social isolation.       Core Components/Risk Factors/Patient Goals Review:    Core Components/Risk Factors/Patient Goals at Discharge (Final Review):    ITP Comments: ITP Comments    Row Name 05/28/19 0949 06/04/19 1618         ITP Comments  Dr. Fransico Him, Medical Director  30 Day ITP Review. Patient attended orientation last week and has not began exercise yet.         Comments: See ITP comments.Barnet Pall, RN,BSN 06/04/2019 4:20 PM

## 2019-06-05 ENCOUNTER — Ambulatory Visit (HOSPITAL_COMMUNITY): Payer: Medicaid Other

## 2019-06-08 ENCOUNTER — Ambulatory Visit (HOSPITAL_COMMUNITY): Payer: Medicaid Other

## 2019-06-10 ENCOUNTER — Ambulatory Visit (HOSPITAL_COMMUNITY): Payer: Medicaid Other

## 2019-06-10 NOTE — Telephone Encounter (Signed)
MyChart conversation documentation:  So as far as the antiplatelet agent, no Eliquis and Xarelto or not Brilinta.  The replacement would be Plavix. If we switch the Plavix, the dose is 75 mg daily, but the first day he would take 4 pills  As for the hormone replacement it is true that there is some potential risk associated with hormone replacement, but I do not think that that necessarily were correlated with your situation because your coronary calcium score was very high and you had multiple other risk factors.  While I do not think it is beneficial from a cardiac standpoint to have hormone replacement, I am not sure that it truly plays a role in your situation.  If this is for symptom relief, or for treatment of some type of abnormal bleeding, then I would recommend continuing it, however once the potential treatment interval is complete, would probably be best to not stay on it long-term.  I saw the strips from cardiac rehab.  Will be on the look out for the event monitor.  [name], MD   ===View-only below this line===   ----- Message -----      From:Julienne Julienne Kass      Sent:06/10/2019  3:51 PM EST        [id number] Ellyn Hack, MD   Subject:RE: Non-Urgent Medical Question  Hey, hope all is well!  Speaking with [facility], I am able to get my blood thinner medication through them as part of my Veteran benefits, BUT they do not provide Brilinta, they do provide Eliquis or Xarelto.  Would either of those be what you would consider in place of the Brilinta?  Also, [facility] is wanting me to make sure you as my Cardiologist are aware that I take Estrogen and Progesterone both for Hormone Replacement Therapy, I actually have Estrogen Tablets that have been inserted under my skin at my hip and take Progesterone Medication daily.  This seems to be a concern with my recent heart issues.  I started HRT in late July 2020.  Do you think this could a part of my heart problem or cause issues in the  future?  Wondering if you were able to locate the strips from [facility] visit that they were going to send to you?  Thanks!!  Shirline Frees [phone]   ----- Message -----      Sheliah Hatch, MD      Sent:06/04/2019  6:46 PM EST        [id number] Julienne Kass   Subject:RE: Non-Urgent Medical Question  OK - will look for the strips . Still do not have monitor results to review.  [name], MD   ----- Message -----      From:Clio Julienne Kass      Sent:06/03/2019  1:24 PM EST        [id number] Ellyn Hack, MD   Subject:RE: Non-Urgent Medical Question  Thanks, still not monitor, heart beating all over the place today, but yesterday was great!  I notice the difference when wearing a mask???? Maybe it's all in my head, lol!!  [place] [phone]   ----- Message -----      Sheliah Hatch, MD      Sent:05/30/2019  6:55 PM EST        [id number] Julienne Kass   Subject:RE: Non-Urgent Medical Question  Unfortunately - I do not have the study to be able to read (probably b/c [facility] ordered it - the computer did not put  it in my in box).  Will look into it  [facility]   ----- Message -----      From:Kaisha Julienne Kass      Sent:05/28/2019  6:31 AM EST        [id number] Ellyn Hack, MD   Subject:Non-Urgent Medical Question  Good Morning, hope as is good!  Hadn't heard anything about the Monitor we talked about, spoke with Lurena Joiner about Heart Rate issues in office last week, as well as having heart flutters.....dancing all around this morning.  Shirline Frees

## 2019-06-12 ENCOUNTER — Other Ambulatory Visit: Payer: Self-pay | Admitting: Cardiology

## 2019-06-12 ENCOUNTER — Telehealth (HOSPITAL_COMMUNITY): Payer: Self-pay | Admitting: *Deleted

## 2019-06-12 ENCOUNTER — Inpatient Hospital Stay (HOSPITAL_COMMUNITY)
Admission: RE | Admit: 2019-06-12 | Discharge: 2019-06-12 | Disposition: A | Discharge status: Home / Self Care | Payer: Medicaid Other | Source: Ambulatory Visit

## 2019-06-12 MED ORDER — TICAGRELOR 90 MG PO TABS: 90.0000 mg | ORAL_TABLET | Freq: Two times a day (BID) | ORAL | 0 refills | Status: DC

## 2019-06-12 NOTE — Telephone Encounter (Signed)
Spoke with [place] says that Dr Ellyn Hack told her to hold off on exercise until he evaluates her in the office next week.Barnet Pall, RN,BSN 06/12/2019 10:27 AM

## 2019-06-12 NOTE — Telephone Encounter (Signed)
Medication sent to pharmacy  

## 2019-06-12 NOTE — Telephone Encounter (Signed)
Left message for Teja to call cardiac rehab as Tyshara has not any exercise sessions since orientation on 05/28/19.Barnet Pall, RN,BSN 06/12/2019 10:10 AM

## 2019-06-12 NOTE — Telephone Encounter (Signed)
°*  STAT* If patient is at the pharmacy, call can be transferred to refill team.   1. Which medications need to be refilled? (please list name of each medication and dose if known)  ticagrelor (BRILINTA) 90 MG TABS tablet  2. Which pharmacy/location (including street and city if local pharmacy) is medication to be sent to?  [id number] [place], [place] - [address]  3. Do they need a 30 day or 90 day supply? 30 with refills  Patient was given this medication after her stent on 05/12/19. She is almost out of medication. She was unable to get it paid for by the [place], so she will need Dr. Ellyn Hack to write her an RX for the medication.

## 2019-06-15 ENCOUNTER — Ambulatory Visit (HOSPITAL_COMMUNITY): Payer: Medicaid Other

## 2019-06-16 ENCOUNTER — Ambulatory Visit (INDEPENDENT_AMBULATORY_CARE_PROVIDER_SITE_OTHER): Payer: Managed Care, Other (non HMO) | Admitting: Cardiology

## 2019-06-16 ENCOUNTER — Encounter: Payer: Self-pay | Admitting: Cardiology

## 2019-06-16 ENCOUNTER — Other Ambulatory Visit: Payer: Self-pay

## 2019-06-16 VITALS — BP 128/78 | HR 64 | Temp 96.8°F | Ht 67.0 in | Wt 200.0 lb

## 2019-06-16 DIAGNOSIS — I2510 Atherosclerotic heart disease of native coronary artery without angina pectoris: Secondary | ICD-10-CM

## 2019-06-16 DIAGNOSIS — Z9861 Coronary angioplasty status: Secondary | ICD-10-CM

## 2019-06-16 DIAGNOSIS — Z9989 Dependence on other enabling machines and devices: Secondary | ICD-10-CM

## 2019-06-16 DIAGNOSIS — R002 Palpitations: Secondary | ICD-10-CM

## 2019-06-16 DIAGNOSIS — R011 Cardiac murmur, unspecified: Secondary | ICD-10-CM

## 2019-06-16 DIAGNOSIS — E785 Hyperlipidemia, unspecified: Secondary | ICD-10-CM

## 2019-06-16 DIAGNOSIS — I25119 Atherosclerotic heart disease of native coronary artery with unspecified angina pectoris: Secondary | ICD-10-CM

## 2019-06-16 DIAGNOSIS — R9431 Abnormal electrocardiogram [ECG] [EKG]: Secondary | ICD-10-CM | POA: Insufficient documentation

## 2019-06-16 DIAGNOSIS — G4733 Obstructive sleep apnea (adult) (pediatric): Secondary | ICD-10-CM

## 2019-06-16 DIAGNOSIS — I10 Essential (primary) hypertension: Secondary | ICD-10-CM

## 2019-06-16 DIAGNOSIS — R0600 Dyspnea, unspecified: Secondary | ICD-10-CM

## 2019-06-16 DIAGNOSIS — R0609 Other forms of dyspnea: Secondary | ICD-10-CM

## 2019-06-16 MED ORDER — LOSARTAN POTASSIUM-HCTZ 100-12.5 MG PO TABS: 0.5000 | ORAL_TABLET | Freq: Every day | ORAL | 11 refills | Status: DC

## 2019-06-16 MED ORDER — NEBIVOLOL HCL 2.5 MG PO TABS: 2.5000 mg | ORAL_TABLET | Freq: Every day | ORAL | 3 refills | Status: DC

## 2019-06-16 MED ORDER — CLOPIDOGREL BISULFATE 75 MG PO TABS: 75.0000 mg | ORAL_TABLET | Freq: Every day | ORAL | 3 refills | Status: DC

## 2019-06-16 NOTE — Patient Instructions (Addendum)
Medication Instructions:   DECREASE  LOSARTAN -HCTZ TO 1/2 TABLET DAILY  STOP TAKING BRILINTA   START CLOPIDOGREL ( Plavix)  75 MG  - THE FIRST DAY TAKE 4 TABLETS, THEN 1 TABLET DAILY   START BYSTOLIC 2.5 MG - 1 TABLET DAILY    NITROGLYCERIN 0.4 MG  AS NEEDED  *If you need a refill on your cardiac medications before your next appointment, please call your pharmacy*  Lab Work: NOT NEEDED   Testing/Procedures: WILL BE SCHEDULE [address] Your physician has requested that you have a lexiscan myoview. For further information please visit [email or web address]. Please follow instruction sheet, as given.   Follow-Up: At [facility], you and your health needs are our priority.  As part of our continuing mission to provide you with exceptional heart care, we have created designated Provider Care Teams.  These Care Teams include your primary Cardiologist (physician) and Advanced Practice Providers (APPs -  Physician Assistants and Nurse Practitioners) who all work together to provide you with the care you need, when you need it.  Your next appointment:   1 month(s)  The format for your next appointment:   In Person  Provider:   [name], MD  Other Instructions   DO NOT GO TO CARDIAC REHAB UNTIL TEST COMPLETED AND NEXT APPT.    Nitroglycerin sublingual tablets What is this medicine? NITROGLYCERIN (nye troe GLI ser in) is a type of vasodilator. It relaxes blood vessels, increasing the blood and oxygen supply to your heart. This medicine is used to relieve chest pain caused by angina. It is also used to prevent chest pain before activities like climbing stairs, going outdoors in cold weather, or sexual activity. This medicine may be used for other purposes; ask your health care provider or pharmacist if you have questions. COMMON BRAND NAME(S): Nitroquick, Nitrostat, Nitrotab What should I tell my health care provider before I take this medicine? They need  to know if you have any of these conditions:  anemia  head injury, recent stroke, or bleeding in the brain  liver disease  previous heart attack  an unusual or allergic reaction to nitroglycerin, other medicines, foods, dyes, or preservatives  pregnant or trying to get pregnant  breast-feeding How should I use this medicine? Take this medicine by mouth as needed. At the first sign of an angina attack (chest pain or tightness) place one tablet under your tongue. You can also take this medicine 5 to 10 minutes before an event likely to produce chest pain. Follow the directions on the prescription label. Let the tablet dissolve under the tongue. Do not swallow whole. Replace the dose if you accidentally swallow it. It will help if your mouth is not dry. Saliva around the tablet will help it to dissolve more quickly. Do not eat or drink, smoke or chew tobacco while a tablet is dissolving. If you are not better within 5 minutes after taking ONE dose of nitroglycerin, call 9-1-1 immediately to seek emergency medical care. Do not take more than 3 nitroglycerin tablets over 15 minutes. If you take this medicine often to relieve symptoms of angina, your doctor or health care professional may provide you with different instructions to manage your symptoms. If symptoms do not go away after following these instructions, it is important to call 9-1-1 immediately. Do not take more than 3 nitroglycerin tablets over 15 minutes. Talk to your pediatrician regarding the use of this medicine in children. Special care may be needed. Overdosage:  If you think you have taken too much of this medicine contact a poison control center or emergency room at once. NOTE: This medicine is only for you. Do not share this medicine with others. What if I miss a dose? This does not apply. This medicine is only used as needed. What may interact with this medicine? Do not take this medicine with any of the following  medications:  certain migraine medicines like ergotamine and dihydroergotamine (DHE)  medicines used to treat erectile dysfunction like sildenafil, tadalafil, and vardenafil  riociguat This medicine may also interact with the following medications:  alteplase  aspirin  heparin  medicines for high blood pressure  medicines for mental depression  other medicines used to treat angina  phenothiazines like chlorpromazine, mesoridazine, prochlorperazine, thioridazine This list may not describe all possible interactions. Give your health care provider a list of all the medicines, herbs, non-prescription drugs, or dietary supplements you use. Also tell them if you smoke, drink alcohol, or use illegal drugs. Some items may interact with your medicine. What should I watch for while using this medicine? Tell your doctor or health care professional if you feel your medicine is no longer working. Keep this medicine with you at all times. Sit or lie down when you take your medicine to prevent falling if you feel dizzy or faint after using it. Try to remain calm. This will help you to feel better faster. If you feel dizzy, take several deep breaths and lie down with your feet propped up, or bend forward with your head resting between your knees. You may get drowsy or dizzy. Do not drive, use machinery, or do anything that needs mental alertness until you know how this drug affects you. Do not stand or sit up quickly, especially if you are an older patient. This reduces the risk of dizzy or fainting spells. Alcohol can make you more drowsy and dizzy. Avoid alcoholic drinks. Do not treat yourself for coughs, colds, or pain while you are taking this medicine without asking your doctor or health care professional for advice. Some ingredients may increase your blood pressure. What side effects may I notice from receiving this medicine? Side effects that you should report to your doctor or health care  professional as soon as possible:  blurred vision  dry mouth  skin rash  sweating  the feeling of extreme pressure in the head  unusually weak or tired Side effects that usually do not require medical attention (report to your doctor or health care professional if they continue or are bothersome):  flushing of the face or neck  headache  irregular heartbeat, palpitations  nausea, vomiting This list may not describe all possible side effects. Call your doctor for medical advice about side effects. You may report side effects to FDA at 1-800-FDA-1088. Where should I keep my medicine? Keep out of the reach of children. Store at room temperature between 20 and 25 degrees C (68 and 77 degrees F). Store in Chief of Staff. Protect from light and moisture. Keep tightly closed. Throw away any unused medicine after the expiration date. NOTE: This sheet is a summary. It may not cover all possible information. If you have questions about this medicine, talk to your doctor, pharmacist, or health care provider.  2020 Elsevier/Gold Standard (2013-05-07 17:57:36)

## 2019-06-16 NOTE — Progress Notes (Signed)
Primary Care Provider: Leonard Downing, MD Cardiologist: [name], MD Electrophysiologist:   Clinic Note: Chief Complaint  Patient presents with  . Follow-up    2 months post PCI LAD.  [name] Shortness of Breath    With exertion and rest  . Headache  . Coronary Artery Disease    No angina, but having exertional dyspnea    HPI:    Carrie Reynolds is a 63 y.o. female with a PMH notable for CAD having angina with recent LAD PCI, along with obesity (s/p gastric bypass), OSA-CPAP who presents today for 1 month follow-up for palpitations.Carrie Reynolds was seen for initial post cath follow-up on October 28 by Kerin Ransom, [place].  She indicated she no longer any chest discomfort but had noted palpitations both individual heartbeats and rapid heart rates usually occurring at rest fast heart rates with minimal exertion (heart regular 100 bpm just walking to the car).  Was not discharged on beta-blocker secondary to bradycardia.  Heart rate in the office was 65. -->   Zio patch ordered-had not been delivered as of 05/28/2019 -> anticipated delivery was 06/05/2019  Had been noted to have PVCs during cardiac rehab  Recent Hospitalizations: None  Reviewed  CV studies:    The following studies were reviewed today: (if available, images/films reviewed: From Epic Chart or Care Everywhere) . LEFT HEART CATH-PCI 05/12/2019: Culprit-p-mLAD 95% 950% SP1) --> Scoring Balloon PTCA -> DES PCI (Resolute DES 3.5 x 38 - 3.8 mm; 0% LAD& 55% SP1).  RPAV 60% -small caliber vessel, did not appear flow-limiting (medical management).  EF 55 to 60%.  Normal LVEDP.  SP1 was jailed post PCI with TIMI I flow.  Patient had angina post PCI.    Event Monitor pending -just received at facility  Interval History:   Carrie Reynolds returns here today earlier than we would have done in response to her event monitor because she has been having some weird episodes of profound dyspnea and unsteadiness.   She said that she really is having exertional dyspnea and having lots of breathing issues.  She has not had any chest pain but is just not having any energy whatsoever.  She been noting lots of fluttering sensations in her chest.  All day this past Saturday she just felt horrible.  Syncope extreme fatigue, irregular heartbeats and dyspnea but no chest pain.  She has had episodes while at cardiac rehab where she has had PVCs somewhat frequently with and without bigeminy and trigeminy.  Unfortunately, she was not on a beta-blocker on discharge because of bradycardia most notably post PCI with some vasovagal effect.  Although she notes irregular heartbeats, she has not noted fast heartbeats just a fluttering sensation.  No syncope or near syncope just dizziness and fatigue.  Although she not had any angina symptoms, she has had exertional dyspnea.  No PND, orthopnea or edema.  CV Review of Symptoms (Summary) Cardiovascular ROS: positive for - dyspnea on exertion, irregular heartbeat, palpitations, shortness of breath and Fatigue negative for - chest pain, edema, orthopnea, paroxysmal nocturnal dyspnea or TIA/amaurosis fugax, syncope/near syncope (has had dizziness)  The patient does have symptoms concerning for COVID-19 infection (fever, chills, cough, or new shortness of breath).  The patient is practicing social distancing. ++ Masking.  ++ Groceries/shopping.  Has not yet started gone back to work, and has not gone back to cardiac rehab since setting this appointment.   REVIEWED OF SYSTEMS   A comprehensive ROS  was performed. Review of Systems  Constitutional: Positive for malaise/fatigue. Negative for chills, fever and weight loss.  HENT: Negative for congestion and nosebleeds.   Respiratory: Positive for cough (Mild, nonproductive) and shortness of breath (Per HPI).   Gastrointestinal: Positive for nausea. Negative for blood in stool, constipation, melena and vomiting.  Genitourinary: Negative  for hematuria.  Musculoskeletal: Negative for joint pain.  Neurological: Positive for dizziness and headaches. Negative for tingling and focal weakness.  Psychiatric/Behavioral: Negative for depression and memory loss. The patient is nervous/anxious and has insomnia (Has not been sleeping well).    I have reviewed and (if needed) personally updated the patient's problem list, medications, allergies, past medical and surgical history, social and family history.   PAST MEDICAL HISTORY   Past Medical History:  Diagnosis Date  . Anxiety   . Arnold-Chiari malformation ([place]) 2006  . CAD S/P PCI 05/20/2019   LAD-Dx1 bifurcation PCI with DES 05/06/2019 Residual 60% PDA Normal LVF  . Cervical strain    syndrome  . Cervicogenic headache   . Concussion with loss of consciousness 10/09/2017  . CTS (carpal tunnel syndrome)   . Depression   . GERD (gastroesophageal reflux disease)   . Glucose intolerance (impaired glucose tolerance)   . High coronary artery calcium score    Score of 1460.  Coronary CTA shows high-grade proximal-mid LAD stenosis (CT FFR 0.50).  Also PDA/PLA 60 to 70% stenosis (CTO FFR 0.75)  . History of kidney stones   . Hypertension   . Migraine headache    with visual aura  . Obesity   . OSA (obstructive sleep apnea)    not treated  . Postmenopausal   . Vertigo    post concussive     PAST SURGICAL HISTORY   Past Surgical History:  Procedure Laterality Date  . ABDOMINAL HYSTERECTOMY  2003   partial- L ovary remains  . ABDOMINAL HYSTERECTOMY    . BUNIONECTOMY     right great toe  . CHOLECYSTECTOMY N/A 06/05/2018   Procedure: LAPAROSCOPIC CHOLECYSTECTOMY WITH POSSIBLE INTRAOPERATIVE CHOLANGIOGRAM;  Surgeon: Erroll Luna, MD;  Location: [facility];  Service: General;  Laterality: N/A;  . CORONARY STENT INTERVENTION N/A 05/12/2019   Procedure: CORONARY STENT INTERVENTION;  Surgeon: Leonie Man, MD;  Location: [facility];  Service: Cardiovascular;;;  Culprit-p-mLAD 95% 950% SP1) --> Scoring Balloon PTCA -> DES PCI (Resolute DES 3.5 x 38 - 3.8 mm; 0% LAD& 55% SP1). -->  SP1 was somewhat jailed with TIMI II flow post PCI.  [name] GASTRIC BYPASS  11/2015   [facility]  . HAND SURGERY    . LEFT HEART CATH AND CORONARY ANGIOGRAPHY N/A 05/12/2019   Procedure: LEFT HEART CATH AND CORONARY ANGIOGRAPHY;  Surgeon: Leonie Man, MD;  Location: [facility];  Service: Cardiovascular;;; Culprit-p-mLAD 95% 950% SP1) --> DES PCI.;  RPAV 60% -small caliber vessel, did not appear flow-limiting (medical management).  EF 55 to 60%.  Normal LVEDP.  . TUBAL LIGATION Bilateral     MEDICATIONS/ALLERGIES   No outpatient medications have been marked as taking for the 06/16/19 encounter (Office Visit) with Leonie Man, MD.    Allergies  Allergen Reactions  . Ceclor [Cefaclor] Hives and Rash  . Tetracyclines & Related Hives  . Lipitor [Atorvastatin Calcium] Cough  . Penicillins Swelling and Other (See Comments)    Has patient had a PCN reaction causing immediate rash, facial/tongue/throat swelling, SOB or lightheadedness with hypotension: No Has patient had a PCN reaction causing  severe rash involving mucus membranes or skin necrosis: No Has patient had a PCN reaction that required hospitalization: Yes Has patient had a PCN reaction occurring within the last 10 years: No If all of the above answers are "NO", then may proceed with Cephalosporin use.  [name] Amoxicillin Rash  . Ampicillin Rash  . Lisinopril Cough  . Simvastatin Cough     SOCIAL HISTORY/FAMILY HISTORY   Social History   Tobacco Use  . Smoking status: Former Smoker    Packs/day: 0.10    Years: 30.00    Pack years: 3.00    Types: Cigarettes    Quit date: 04/26/2012    Years since quitting: 7.1  . Smokeless tobacco: Never Used  Substance Use Topics  . Alcohol use: Yes    Comment: occasional  . Drug use: No   Social History   Social History Narrative   ** Merged History Encounter  **       Lives with husband [name]). She travels very frequently with her job and is gone most weeks out of the month. She is active and exercises when able.    Family History family history includes Alzheimer's disease in her father and paternal grandmother; Cancer in her father and paternal grandfather; Diabetes Mellitus II in her sister; Hyperlipidemia in her sister; Hypertension in her mother and sister; Migraines in her father.   OBJCTIVE -PE, EKG, labs   Wt Readings from Last 3 Encounters:  06/16/19 200 lb (90.7 kg)  05/28/19 201 lb 15.1 oz (91.6 kg)  05/20/19 203 lb (92.1 kg)    Physical Exam: BP 128/78 (BP Location: Left Arm, Patient Position: Sitting, Cuff Size: Normal)   Pulse 64   Temp (!) 96.8 F (36 C)   Ht 5\' 7"  (1.702 m)   Wt 200 lb (90.7 kg)   BMI 31.32 kg/m  Physical Exam  Constitutional: She is oriented to person, place, and time. She appears well-developed and well-nourished. No distress.  Healthy-appearing.  Well-groomed  HENT:  Head: Normocephalic and atraumatic.  Neck: Normal range of motion. Neck supple. Normal carotid pulses, no hepatojugular reflux and no JVD present. Carotid bruit is not present. No tracheal deviation present.  Cardiovascular: Normal rate, regular rhythm, normal heart sounds and intact distal pulses.  Occasional extrasystoles are present. PMI is not displaced. Exam reveals no gallop and no friction rub.  No murmur heard. Pulmonary/Chest: Effort normal and breath sounds normal. No respiratory distress. She has no wheezes.  Abdominal: Soft. Bowel sounds are normal. She exhibits no distension. There is no abdominal tenderness. There is no rebound.  Musculoskeletal: Normal range of motion.        General: No edema.     Comments: Radial cath site clean dry and intact.  Barely visible.  No ecchymosis  Neurological: She is alert and oriented to person, place, and time.  Skin: Skin is warm and dry.  Psychiatric: Her behavior is normal.  Judgment and thought content normal.  Seems little bit anxious, but otherwise normal mood and affect.  Vitals reviewed.   Adult ECG Report  Rate: 64 ;  Rhythm: normal sinus rhythm and Nonspecific ST-T wave changes with deep T wave inversions in V1 and V2-has appearance of evolving septal MI.;   Narrative Interpretation: T wave inversions in L straight T wave inversions without the biphasic features noted post PCI.  Recent Labs:    --From 2020: TC 234, TG 80.5, HDL 33, LDL 136-->LDL-P22056* (Very High >2000, goal <1000), HDL-P 35.4 (goal> 30.5).Small  LDL-P9 51 (high.Goal<527). 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  There is extensive calcification throughout and therefore we need aggressive respect medications. Was started on rosuvastatin and seems to be tolerating it well. => Need to shoot for LDL close to 50.  Monitor glycemic control.  Patient had questions whether this could have something to do with her being on hormone replacement therapy, unlikely given the extent of calcification that this was something that has just happened since starting HRT.      Relevant Medications   nebivolol (BYSTOLIC) 2.5 MG tablet   losartan-hydrochlorothiazide (HYZAAR) 100-12.5 MG tablet   Abnormal cardiac CT angiography (Chronic)   Relevant Orders   EKG 12-Lead (Completed)   LEXISCAN---MYOCARDIAL PERFUSION IMAGING   CAD S/P DES PCI-proximal LAD - Primary (Chronic)    PCI of LAD just prior to D1 bifurcation (there was diminished flow down septal  perforator branch.  She did have some anginal symptoms post PCI in her EKG shows concerning signs for septal MI. ->  She is on Brilinta and aspirin, statin and ARB.  We are adding beta-blocker.  Plan:   With dyspnea will convert from Brilinta to Plavix to rule out side effect. ->  First dose Plavix will be 300 mg  Plan is uninterrupted Thienopyridine antiplatelet agent (now Plavix) for 6 months.  Would then continue for maintenance going forward, but can be interrupted.  Given the concerning findings on EKG, need to exclude additional ischemia (there was the RPA V lesion) -> will check Lexiscan Myoview  Add Bystolic 2.5 mg daily (cut ARB-HCTZ dose in half)       Relevant Medications   nebivolol (BYSTOLIC) 2.5 MG tablet   losartan-hydrochlorothiazide (HYZAAR) 100-12.5 MG tablet   Other Relevant Orders   EKG 12-Lead (Completed)   LEXISCAN---MYOCARDIAL PERFUSION IMAGING   Essential hypertension (Chronic)    Blood pressure pretty well controlled today.  She is on high-dose of losartan with HCTZ.  I would like to add a beta-blocker because the PVCs and the fluttering sensation that she has been noting.  To avoid excess bradycardia, will try Bystolic 2.5 mg daily. Noted do so we will have her cut her losartan-HCTZ dose in half.      Relevant Medications   nebivolol (BYSTOLIC) 2.5 MG tablet   losartan-hydrochlorothiazide (HYZAAR) 100-12.5 MG tablet   Dyslipidemia, goal LDL below 70 (Chronic)    Discharge post PCI on 20 mg rosuvastatin.  Due to recheck early next year. -> Will order follow-up labs in next visit.      Relevant Medications   nebivolol (BYSTOLIC) 2.5 MG tablet   losartan-hydrochlorothiazide (HYZAAR) 100-12.5 MG tablet   OSA on CPAP (Chronic)   Rapid palpitations (Chronic)    Monitor pending.  She does have baseline bradycardia, however did have PVCs noted in cardiac rehab.  As she is quite symptomatic, we will start low-dose Bystolic (hopefully this will avoid excess  bradycardia). Await results of monitor.      Relevant Orders  EKG 12-Lead (Completed)   Systolic murmur (Chronic)    I have not heard murmur in the last 3 exams.  Echocardiogram not show anything significant in 2018.      DOE (dyspnea on exertion)    Profound exertional dyspnea.  Need to exclude ischemia given EKG changes and known LAD PCI.   Plan: Lexiscan Myoview      Relevant Orders   EKG 12-Lead (Completed)   LEXISCAN---MYOCARDIAL PERFUSION IMAGING   Abnormal finding on EKG    Concerning finding with T wave inversions in septal leads that would suggest septal ischemia versus evolving changes of septal MI from the jailed SP1.  Plan to check Myoview stress test to exclude new ischemia.      Relevant Orders   LEXISCAN---MYOCARDIAL PERFUSION IMAGING      COVID-19 Education: The signs and symptoms of COVID-19 were discussed with the patient and how to seek care for testing (follow up with PCP or arrange E-visit).   The importance of social distancing was discussed today.  I spent a total of 42minutes with the patient and chart review. >  50% of the time was spent in direct patient consultation.  Additional time spent with chart review (studies, outside notes, etc): 15 Total Time: 43 min   Current medicines are reviewed at length with the patient today.  (+/- concerns) none   Patient Instructions / Medication Changes & Studies & Tests Ordered   Patient Instructions  Medication Instructions:   DECREASE  LOSARTAN -HCTZ TO 1/2 TABLET DAILY  STOP TAKING BRILINTA   START CLOPIDOGREL ( Plavix)  75 MG  - THE FIRST DAY TAKE 4 TABLETS, THEN 1 TABLET DAILY   START BYSTOLIC 2.5 MG - 1 TABLET DAILY    NITROGLYCERIN 0.4 MG  AS NEEDED  *If you need a refill on your cardiac medications before your next appointment, please call your pharmacy*  Lab Work: NOT NEEDED   Testing/Procedures: WILL BE SCHEDULE [address] Your physician has requested that you  have a lexiscan myoview. For further information please visit [email or web address]. Please follow instruction sheet, as given.   Follow-Up: At [facility], you and your health needs are our priority.  As part of our continuing mission to provide you with exceptional heart care, we have created designated Provider Care Teams.  These Care Teams include your primary Cardiologist (physician) and Advanced Practice Providers (APPs -  Physician Assistants and Nurse Practitioners) who all work together to provide you with the care you need, when you need it.  Your next appointment:   1 month(s)  The format for your next appointment:   In Person  Provider:   [name], MD  Other Instructions   DO NOT GO TO CARDIAC REHAB UNTIL TEST COMPLETED AND NEXT APPT.   Studies Ordered:   Orders Placed This Encounter  Procedures  . LEXISCAN---MYOCARDIAL PERFUSION IMAGING  . EKG 12-Lead     [name], M.D., M.S. Interventional Cardiologist   Pager # [phone] Phone # [phone] [address]. [place],  [address]   Thank you for choosing Heartcare at [facility]!!

## 2019-06-16 NOTE — H&P (View-Only) (Signed)
Primary Care Provider: Leonard Downing, MD Cardiologist: [name], MD Electrophysiologist:   Clinic Note: Chief Complaint  Patient presents with  . Follow-up    2 months post PCI LAD.  [name] Shortness of Breath    With exertion and rest  . Headache  . Coronary Artery Disease    No angina, but having exertional dyspnea    HPI:    Carrie Reynolds is a 63 y.o. female with a PMH notable for CAD having angina with recent LAD PCI, along with obesity (s/p gastric bypass), OSA-CPAP who presents today for 1 month follow-up for palpitations.Carrie Reynolds was seen for initial post cath follow-up on October 28 by Kerin Ransom, [place].  She indicated she no longer any chest discomfort but had noted palpitations both individual heartbeats and rapid heart rates usually occurring at rest fast heart rates with minimal exertion (heart regular 100 bpm just walking to the car).  Was not discharged on beta-blocker secondary to bradycardia.  Heart rate in the office was 73. -->   Zio patch ordered-had not been delivered as of 05/28/2019 -> anticipated delivery was 06/05/2019  Had been noted to have PVCs during cardiac rehab  Recent Hospitalizations: None  Reviewed  CV studies:    The following studies were reviewed today: (if available, images/films reviewed: From Epic Chart or Care Everywhere) . LEFT HEART CATH-PCI 05/12/2019: Culprit-p-mLAD 95% 950% SP1) --> Scoring Balloon PTCA -> DES PCI (Resolute DES 3.5 x 38 - 3.8 mm; 0% LAD& 55% SP1).  RPAV 60% -small caliber vessel, did not appear flow-limiting (medical management).  EF 55 to 60%.  Normal LVEDP.  SP1 was jailed post PCI with TIMI I flow.  Patient had angina post PCI.    Event Monitor pending -just received at facility  Interval History:   Carrie Reynolds returns here today earlier than we would have done in response to her event monitor because she has been having some weird episodes of profound dyspnea and unsteadiness.   She said that she really is having exertional dyspnea and having lots of breathing issues.  She has not had any chest pain but is just not having any energy whatsoever.  She been noting lots of fluttering sensations in her chest.  All day this past Saturday she just felt horrible.  Syncope extreme fatigue, irregular heartbeats and dyspnea but no chest pain.  She has had episodes while at cardiac rehab where she has had PVCs somewhat frequently with and without bigeminy and trigeminy.  Unfortunately, she was not on a beta-blocker on discharge because of bradycardia most notably post PCI with some vasovagal effect.  Although she notes irregular heartbeats, she has not noted fast heartbeats just a fluttering sensation.  No syncope or near syncope just dizziness and fatigue.  Although she not had any angina symptoms, she has had exertional dyspnea.  No PND, orthopnea or edema.  CV Review of Symptoms (Summary) Cardiovascular ROS: positive for - dyspnea on exertion, irregular heartbeat, palpitations, shortness of breath and Fatigue negative for - chest pain, edema, orthopnea, paroxysmal nocturnal dyspnea or TIA/amaurosis fugax, syncope/near syncope (has had dizziness)  The patient does have symptoms concerning for COVID-19 infection (fever, chills, cough, or new shortness of breath).  The patient is practicing social distancing. ++ Masking.  ++ Groceries/shopping.  Has not yet started gone back to work, and has not gone back to cardiac rehab since setting this appointment.   REVIEWED OF SYSTEMS   A comprehensive ROS  was performed. Review of Systems  Constitutional: Positive for malaise/fatigue. Negative for chills, fever and weight loss.  HENT: Negative for congestion and nosebleeds.   Respiratory: Positive for cough (Mild, nonproductive) and shortness of breath (Per HPI).   Gastrointestinal: Positive for nausea. Negative for blood in stool, constipation, melena and vomiting.  Genitourinary: Negative  for hematuria.  Musculoskeletal: Negative for joint pain.  Neurological: Positive for dizziness and headaches. Negative for tingling and focal weakness.  Psychiatric/Behavioral: Negative for depression and memory loss. The patient is nervous/anxious and has insomnia (Has not been sleeping well).    I have reviewed and (if needed) personally updated the patient's problem list, medications, allergies, past medical and surgical history, social and family history.   PAST MEDICAL HISTORY   Past Medical History:  Diagnosis Date  . Anxiety   . Arnold-Chiari malformation (Advance) 2006  . CAD S/P PCI 05/20/2019   LAD-Dx1 bifurcation PCI with DES 05/06/2019 Residual 60% PDA Normal LVF  . Cervical strain    syndrome  . Cervicogenic headache   . Concussion with loss of consciousness 10/09/2017  . CTS (carpal tunnel syndrome)   . Depression   . GERD (gastroesophageal reflux disease)   . Glucose intolerance (impaired glucose tolerance)   . High coronary artery calcium score    Score of 1460.  Coronary CTA shows high-grade proximal-mid LAD stenosis (CT FFR 0.50).  Also PDA/PLA 60 to 70% stenosis (CTO FFR 0.75)  . History of kidney stones   . Hypertension   . Migraine headache    with visual aura  . Obesity   . OSA (obstructive sleep apnea)    not treated  . Postmenopausal   . Vertigo    post concussive     PAST SURGICAL HISTORY   Past Surgical History:  Procedure Laterality Date  . ABDOMINAL HYSTERECTOMY  2003   partial- L ovary remains  . ABDOMINAL HYSTERECTOMY    . BUNIONECTOMY     right great toe  . CHOLECYSTECTOMY N/A 06/05/2018   Procedure: LAPAROSCOPIC CHOLECYSTECTOMY WITH POSSIBLE INTRAOPERATIVE CHOLANGIOGRAM;  Surgeon: Erroll Luna, MD;  Location: [facility];  Service: General;  Laterality: N/A;  . CORONARY STENT INTERVENTION N/A 05/12/2019   Procedure: CORONARY STENT INTERVENTION;  Surgeon: Leonie Man, MD;  Location: [facility];  Service: Cardiovascular;;;  Culprit-p-mLAD 95% 950% SP1) --> Scoring Balloon PTCA -> DES PCI (Resolute DES 3.5 x 38 - 3.8 mm; 0% LAD& 55% SP1). -->  SP1 was somewhat jailed with TIMI II flow post PCI.  [name] GASTRIC BYPASS  11/2015   [facility]  . HAND SURGERY    . LEFT HEART CATH AND CORONARY ANGIOGRAPHY N/A 05/12/2019   Procedure: LEFT HEART CATH AND CORONARY ANGIOGRAPHY;  Surgeon: Leonie Man, MD;  Location: [facility];  Service: Cardiovascular;;; Culprit-p-mLAD 95% 950% SP1) --> DES PCI.;  RPAV 60% -small caliber vessel, did not appear flow-limiting (medical management).  EF 55 to 60%.  Normal LVEDP.  . TUBAL LIGATION Bilateral     MEDICATIONS/ALLERGIES   No outpatient medications have been marked as taking for the 06/16/19 encounter (Office Visit) with Leonie Man, MD.    Allergies  Allergen Reactions  . Ceclor [Cefaclor] Hives and Rash  . Tetracyclines & Related Hives  . Lipitor [Atorvastatin Calcium] Cough  . Penicillins Swelling and Other (See Comments)    Has patient had a PCN reaction causing immediate rash, facial/tongue/throat swelling, SOB or lightheadedness with hypotension: No Has patient had a PCN reaction causing  severe rash involving mucus membranes or skin necrosis: No Has patient had a PCN reaction that required hospitalization: Yes Has patient had a PCN reaction occurring within the last 10 years: No If all of the above answers are "NO", then may proceed with Cephalosporin use.  [name] Amoxicillin Rash  . Ampicillin Rash  . Lisinopril Cough  . Simvastatin Cough     SOCIAL HISTORY/FAMILY HISTORY   Social History   Tobacco Use  . Smoking status: Former Smoker    Packs/day: 0.10    Years: 30.00    Pack years: 3.00    Types: Cigarettes    Quit date: 04/26/2012    Years since quitting: 7.1  . Smokeless tobacco: Never Used  Substance Use Topics  . Alcohol use: Yes    Comment: occasional  . Drug use: No   Social History   Social History Narrative   ** Merged History Encounter  **       Lives with husband [name]). She travels very frequently with her job and is gone most weeks out of the month. She is active and exercises when able.    Family History family history includes Alzheimer's disease in her father and paternal grandmother; Cancer in her father and paternal grandfather; Diabetes Mellitus II in her sister; Hyperlipidemia in her sister; Hypertension in her mother and sister; Migraines in her father.   OBJCTIVE -PE, EKG, labs   Wt Readings from Last 3 Encounters:  06/16/19 200 lb (90.7 kg)  05/28/19 201 lb 15.1 oz (91.6 kg)  05/20/19 203 lb (92.1 kg)    Physical Exam: BP 128/78 (BP Location: Left Arm, Patient Position: Sitting, Cuff Size: Normal)   Pulse 64   Temp (!) 96.8 F (36 C)   Ht 5\' 7"  (1.702 m)   Wt 200 lb (90.7 kg)   BMI 31.32 kg/m  Physical Exam  Constitutional: She is oriented to person, place, and time. She appears well-developed and well-nourished. No distress.  Healthy-appearing.  Well-groomed  HENT:  Head: Normocephalic and atraumatic.  Neck: Normal range of motion. Neck supple. Normal carotid pulses, no hepatojugular reflux and no JVD present. Carotid bruit is not present. No tracheal deviation present.  Cardiovascular: Normal rate, regular rhythm, normal heart sounds and intact distal pulses.  Occasional extrasystoles are present. PMI is not displaced. Exam reveals no gallop and no friction rub.  No murmur heard. Pulmonary/Chest: Effort normal and breath sounds normal. No respiratory distress. She has no wheezes.  Abdominal: Soft. Bowel sounds are normal. She exhibits no distension. There is no abdominal tenderness. There is no rebound.  Musculoskeletal: Normal range of motion.        General: No edema.     Comments: Radial cath site clean dry and intact.  Barely visible.  No ecchymosis  Neurological: She is alert and oriented to person, place, and time.  Skin: Skin is warm and dry.  Psychiatric: Her behavior is normal.  Judgment and thought content normal.  Seems little bit anxious, but otherwise normal mood and affect.  Vitals reviewed.   Adult ECG Report  Rate: 64 ;  Rhythm: normal sinus rhythm and Nonspecific ST-T wave changes with deep T wave inversions in V1 and V2-has appearance of evolving septal MI.;   Narrative Interpretation: T wave inversions in L straight T wave inversions without the biphasic features noted post PCI.  Recent Labs:    --From 2020: TC 234, TG 80.5, HDL 33, LDL 136-->LDL-P22056* (Very High >2000, goal <1000), HDL-P 35.4 (goal> 30.5).Small  LDL-P9 51 (high.Goal<527). 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  There is extensive calcification throughout and therefore we need aggressive respect medications. Was started on rosuvastatin and seems to be tolerating it well. => Need to shoot for LDL close to 50.  Monitor glycemic control.  Patient had questions whether this could have something to do with her being on hormone replacement therapy, unlikely given the extent of calcification that this was something that has just happened since starting HRT.      Relevant Medications   nebivolol (BYSTOLIC) 2.5 MG tablet   losartan-hydrochlorothiazide (HYZAAR) 100-12.5 MG tablet   Abnormal cardiac CT angiography (Chronic)   Relevant Orders   EKG 12-Lead (Completed)   LEXISCAN---MYOCARDIAL PERFUSION IMAGING   CAD S/P DES PCI-proximal LAD - Primary (Chronic)    PCI of LAD just prior to D1 bifurcation (there was diminished flow down septal  perforator branch.  She did have some anginal symptoms post PCI in her EKG shows concerning signs for septal MI. ->  She is on Brilinta and aspirin, statin and ARB.  We are adding beta-blocker.  Plan:   With dyspnea will convert from Brilinta to Plavix to rule out side effect. ->  First dose Plavix will be 300 mg  Plan is uninterrupted Thienopyridine antiplatelet agent (now Plavix) for 6 months.  Would then continue for maintenance going forward, but can be interrupted.  Given the concerning findings on EKG, need to exclude additional ischemia (there was the RPA V lesion) -> will check Lexiscan Myoview  Add Bystolic 2.5 mg daily (cut ARB-HCTZ dose in half)       Relevant Medications   nebivolol (BYSTOLIC) 2.5 MG tablet   losartan-hydrochlorothiazide (HYZAAR) 100-12.5 MG tablet   Other Relevant Orders   EKG 12-Lead (Completed)   LEXISCAN---MYOCARDIAL PERFUSION IMAGING   Essential hypertension (Chronic)    Blood pressure pretty well controlled today.  She is on high-dose of losartan with HCTZ.  I would like to add a beta-blocker because the PVCs and the fluttering sensation that she has been noting.  To avoid excess bradycardia, will try Bystolic 2.5 mg daily. Noted do so we will have her cut her losartan-HCTZ dose in half.      Relevant Medications   nebivolol (BYSTOLIC) 2.5 MG tablet   losartan-hydrochlorothiazide (HYZAAR) 100-12.5 MG tablet   Dyslipidemia, goal LDL below 70 (Chronic)    Discharge post PCI on 20 mg rosuvastatin.  Due to recheck early next year. -> Will order follow-up labs in next visit.      Relevant Medications   nebivolol (BYSTOLIC) 2.5 MG tablet   losartan-hydrochlorothiazide (HYZAAR) 100-12.5 MG tablet   OSA on CPAP (Chronic)   Rapid palpitations (Chronic)    Monitor pending.  She does have baseline bradycardia, however did have PVCs noted in cardiac rehab.  As she is quite symptomatic, we will start low-dose Bystolic (hopefully this will avoid excess  bradycardia). Await results of monitor.      Relevant Orders  EKG 12-Lead (Completed)   Systolic murmur (Chronic)    I have not heard murmur in the last 3 exams.  Echocardiogram not show anything significant in 2018.      DOE (dyspnea on exertion)    Profound exertional dyspnea.  Need to exclude ischemia given EKG changes and known LAD PCI.   Plan: Lexiscan Myoview      Relevant Orders   EKG 12-Lead (Completed)   LEXISCAN---MYOCARDIAL PERFUSION IMAGING   Abnormal finding on EKG    Concerning finding with T wave inversions in septal leads that would suggest septal ischemia versus evolving changes of septal MI from the jailed SP1.  Plan to check Myoview stress test to exclude new ischemia.      Relevant Orders   LEXISCAN---MYOCARDIAL PERFUSION IMAGING      COVID-19 Education: The signs and symptoms of COVID-19 were discussed with the patient and how to seek care for testing (follow up with PCP or arrange E-visit).   The importance of social distancing was discussed today.  I spent a total of 48minutes with the patient and chart review. >  50% of the time was spent in direct patient consultation.  Additional time spent with chart review (studies, outside notes, etc): 15 Total Time: 43 min   Current medicines are reviewed at length with the patient today.  (+/- concerns) none   Patient Instructions / Medication Changes & Studies & Tests Ordered   Patient Instructions  Medication Instructions:   DECREASE  LOSARTAN -HCTZ TO 1/2 TABLET DAILY  STOP TAKING BRILINTA   START CLOPIDOGREL ( Plavix)  75 MG  - THE FIRST DAY TAKE 4 TABLETS, THEN 1 TABLET DAILY   START BYSTOLIC 2.5 MG - 1 TABLET DAILY    NITROGLYCERIN 0.4 MG  AS NEEDED  *If you need a refill on your cardiac medications before your next appointment, please call your pharmacy*  Lab Work: NOT NEEDED   Testing/Procedures: WILL BE SCHEDULE [address] Your physician has requested that you  have a lexiscan myoview. For further information please visit [email or web address]. Please follow instruction sheet, as given.   Follow-Up: At [facility], you and your health needs are our priority.  As part of our continuing mission to provide you with exceptional heart care, we have created designated Provider Care Teams.  These Care Teams include your primary Cardiologist (physician) and Advanced Practice Providers (APPs -  Physician Assistants and Nurse Practitioners) who all work together to provide you with the care you need, when you need it.  Your next appointment:   1 month(s)  The format for your next appointment:   In Person  Provider:   [name], MD  Other Instructions   DO NOT GO TO CARDIAC REHAB UNTIL TEST COMPLETED AND NEXT APPT.   Studies Ordered:   Orders Placed This Encounter  Procedures  . LEXISCAN---MYOCARDIAL PERFUSION IMAGING  . EKG 12-Lead     [name], M.D., M.S. Interventional Cardiologist   Pager # [phone] Phone # [phone] [address]. [place], [place] [address]   Thank you for choosing Heartcare at [facility]!!

## 2019-06-17 ENCOUNTER — Ambulatory Visit (HOSPITAL_COMMUNITY): Payer: Medicaid Other

## 2019-06-17 ENCOUNTER — Encounter: Payer: Self-pay | Admitting: Cardiology

## 2019-06-17 ENCOUNTER — Telehealth (HOSPITAL_COMMUNITY): Payer: Self-pay

## 2019-06-17 DIAGNOSIS — I25119 Atherosclerotic heart disease of native coronary artery with unspecified angina pectoris: Secondary | ICD-10-CM | POA: Insufficient documentation

## 2019-06-17 NOTE — Assessment & Plan Note (Signed)
Monitor pending.  She does have baseline bradycardia, however did have PVCs noted in cardiac rehab.  As she is quite symptomatic, we will start low-dose Bystolic (hopefully this will avoid excess bradycardia). Await results of monitor.

## 2019-06-17 NOTE — Assessment & Plan Note (Signed)
I have not heard murmur in the last 3 exams.  Echocardiogram not show anything significant in 2018.

## 2019-06-17 NOTE — Assessment & Plan Note (Signed)
Discharge post PCI on 20 mg rosuvastatin.  Due to recheck early next year. -> Will order follow-up labs in next visit.

## 2019-06-17 NOTE — Assessment & Plan Note (Signed)
PCI of LAD just prior to D1 bifurcation (there was diminished flow down septal perforator branch.  She did have some anginal symptoms post PCI in her EKG shows concerning signs for septal MI. ->  She is on Brilinta and aspirin, statin and ARB.  We are adding beta-blocker.  Plan:   With dyspnea will convert from Brilinta to Plavix to rule out side effect. ->  First dose Plavix will be 300 mg  Plan is uninterrupted Thienopyridine antiplatelet agent (now Plavix) for 6 months.  Would then continue for maintenance going forward, but can be interrupted.  Given the concerning findings on EKG, need to exclude additional ischemia (there was the RPA V lesion) -> will check Lexiscan Myoview  Add Bystolic 2.5 mg daily (cut ARB-HCTZ dose in half)

## 2019-06-17 NOTE — Assessment & Plan Note (Signed)
Concerning finding with T wave inversions in septal leads that would suggest septal ischemia versus evolving changes of septal MI from the jailed SP1.  Plan to check Myoview stress test to exclude new ischemia.

## 2019-06-17 NOTE — Assessment & Plan Note (Deleted)
She had clear culprit lesion in the LAD, but also had notable stenosis in the RP AV albeit too small for PCI/not favorable.  There is extensive calcification throughout and therefore we need aggressive respect medications. Was started on rosuvastatin and seems to be tolerating it well. => Need to shoot for LDL close to 50.  Monitor glycemic control.  Patient had questions whether this could have something to do with her being on hormone replacement therapy, unlikely given the extent of calcification that this was something that has just happened since starting HRT.

## 2019-06-17 NOTE — Telephone Encounter (Signed)
Encounter complete. 

## 2019-06-17 NOTE — Assessment & Plan Note (Signed)
Profound exertional dyspnea.  Need to exclude ischemia given EKG changes and known LAD PCI.   Plan: [employer]

## 2019-06-17 NOTE — Assessment & Plan Note (Signed)
Blood pressure pretty well controlled today.  She is on high-dose of losartan with HCTZ.  I would like to add a beta-blocker because the PVCs and the fluttering sensation that she has been noting.  To avoid excess bradycardia, will try Bystolic 2.5 mg daily. Noted do so we will have her cut her losartan-HCTZ dose in half.

## 2019-06-17 NOTE — Assessment & Plan Note (Signed)
She had clear culprit lesion in the LAD, but also had notable stenosis in the RP AV albeit too small for PCI/not favorable.  There is extensive calcification throughout and therefore we need aggressive respect medications. Was started on rosuvastatin and seems to be tolerating it well. => Need to shoot for LDL close to 50.  Monitor glycemic control.  Patient had questions whether this could have something to do with her being on hormone replacement therapy, unlikely given the extent of calcification that this was something that has just happened since starting HRT.

## 2019-06-19 ENCOUNTER — Ambulatory Visit (HOSPITAL_COMMUNITY): Payer: Medicaid Other

## 2019-06-22 ENCOUNTER — Ambulatory Visit (HOSPITAL_COMMUNITY): Payer: Medicaid Other

## 2019-06-24 ENCOUNTER — Other Ambulatory Visit: Payer: Self-pay

## 2019-06-24 ENCOUNTER — Ambulatory Visit (HOSPITAL_COMMUNITY)
Admission: RE | Admit: 2019-06-24 | Discharge: 2019-06-24 | Disposition: A | Discharge status: Home / Self Care | Payer: Self-pay | Source: Ambulatory Visit | Attending: Cardiovascular Disease | Admitting: Cardiovascular Disease

## 2019-06-24 ENCOUNTER — Ambulatory Visit (HOSPITAL_COMMUNITY): Payer: Medicaid Other

## 2019-06-24 DIAGNOSIS — R9431 Abnormal electrocardiogram [ECG] [EKG]: Secondary | ICD-10-CM | POA: Insufficient documentation

## 2019-06-24 DIAGNOSIS — R0609 Other forms of dyspnea: Secondary | ICD-10-CM

## 2019-06-24 DIAGNOSIS — Z9861 Coronary angioplasty status: Secondary | ICD-10-CM | POA: Insufficient documentation

## 2019-06-24 DIAGNOSIS — R0600 Dyspnea, unspecified: Secondary | ICD-10-CM | POA: Insufficient documentation

## 2019-06-24 DIAGNOSIS — I2510 Atherosclerotic heart disease of native coronary artery without angina pectoris: Secondary | ICD-10-CM | POA: Insufficient documentation

## 2019-06-24 HISTORY — PX: NM MYOVIEW LTD: HXRAD82

## 2019-06-24 LAB — MYOCARDIAL PERFUSION IMAGING
LV dias vol: 106 mL (ref 46–106)
LV sys vol: 52 mL
Peak HR: 89 {beats}/min
Rest HR: 54 {beats}/min
SDS: 5
SRS: 3
SSS: 8
TID: 1.11

## 2019-06-24 MED ORDER — TECHNETIUM TC 99M TETROFOSMIN IV KIT
10.9000 | PACK | Freq: Once | INTRAVENOUS | Status: AC | PRN
  Administered 2019-06-24: 10.9 via INTRAVENOUS
  Filled 2019-06-24: qty 11

## 2019-06-24 MED ORDER — REGADENOSON 0.4 MG/5ML IV SOLN
0.4000 mg | Freq: Once | INTRAVENOUS | Status: AC
  Administered 2019-06-24: 0.4 mg via INTRAVENOUS

## 2019-06-24 MED ORDER — TECHNETIUM TC 99M TETROFOSMIN IV KIT
31.8000 | PACK | Freq: Once | INTRAVENOUS | Status: AC | PRN
  Administered 2019-06-24: 31.8 via INTRAVENOUS
  Filled 2019-06-24: qty 32

## 2019-06-24 NOTE — Progress Notes (Signed)
Very interesting, the stress test goes along with will be for the on EKG. There is suggestion that there is still a problem in the area where the stent is.  With recurrent symptoms, I think we probably need to consider going back to taking a look to see how everything looks like.  I do think it could very well be related to the sidebranch, but we need to make sure.  Would like to set her up for cardiac catheterization.  If necessary she can come back in for clinic visit to discuss results, but if she is okay with it would prefer to just proceed with cardiac catheterization and try to schedule for next week.  [name], MD

## 2019-06-24 NOTE — H&P (View-Only) (Signed)
Very interesting, the stress test goes along with will be for the on EKG. There is suggestion that there is still a problem in the area where the stent is.  With recurrent symptoms, I think we probably need to consider going back to taking a look to see how everything looks like.  I do think it could very well be related to the sidebranch, but we need to make sure.  Would like to set her up for cardiac catheterization.  If necessary she can come back in for clinic visit to discuss results, but if she is okay with it would prefer to just proceed with cardiac catheterization and try to schedule for next week.  [name], MD

## 2019-06-25 ENCOUNTER — Telehealth: Payer: Self-pay | Admitting: *Deleted

## 2019-06-25 DIAGNOSIS — Z01818 Encounter for other preprocedural examination: Secondary | ICD-10-CM

## 2019-06-25 DIAGNOSIS — R0609 Other forms of dyspnea: Secondary | ICD-10-CM

## 2019-06-25 DIAGNOSIS — Z9861 Coronary angioplasty status: Secondary | ICD-10-CM

## 2019-06-25 DIAGNOSIS — I25119 Atherosclerotic heart disease of native coronary artery with unspecified angina pectoris: Secondary | ICD-10-CM

## 2019-06-25 DIAGNOSIS — R9439 Abnormal result of other cardiovascular function study: Secondary | ICD-10-CM

## 2019-06-25 DIAGNOSIS — I2510 Atherosclerotic heart disease of native coronary artery without angina pectoris: Secondary | ICD-10-CM

## 2019-06-25 NOTE — Telephone Encounter (Signed)
-----   Message from Leonie Man, MD sent at 06/24/2019 11:21 PM EST ----- Very interesting, the stress test goes along with will be for the on EKG. There is suggestion that there is still a problem in the area where the stent is.  With recurrent symptoms, I think we probably need to consider going back to taking a look to see how everything looks like.  I do think it could very well be related to the sidebranch, but we need to make sure.  Would like to set her up for cardiac catheterization.  If necessary she can come back in for clinic visit to discuss results, but if she is okay with it would prefer to just proceed with cardiac catheterization and try to schedule for next week.  [name], MD

## 2019-06-25 NOTE — Telephone Encounter (Signed)
Reviewed via mychart  spoke with patient  She is in agreement to have cardiac catheterization next week . Aware will call and give schedule details

## 2019-06-26 ENCOUNTER — Ambulatory Visit (HOSPITAL_COMMUNITY): Payer: Medicaid Other

## 2019-06-26 NOTE — Addendum Note (Signed)
Addended by: Raiford Simmonds on: 06/26/2019 04:55 PM   Modules accepted: Orders

## 2019-06-26 NOTE — Telephone Encounter (Signed)
Spoke to patient .  Details for cardiac cath has been completed.  patient aware cath schedule for 07/02/19 at 12 noon  labs and covid test for Monday Jun 29, 2019.  instruction  Will be sent via mychart.  Patient verbalized understanding.

## 2019-06-29 ENCOUNTER — Other Ambulatory Visit (HOSPITAL_COMMUNITY)
Admission: RE | Admit: 2019-06-29 | Discharge: 2019-06-29 | Disposition: A | Discharge status: Home / Self Care | Payer: Managed Care, Other (non HMO) | Source: Ambulatory Visit | Attending: Cardiology | Admitting: Cardiology

## 2019-06-29 ENCOUNTER — Ambulatory Visit (HOSPITAL_COMMUNITY): Payer: Medicaid Other

## 2019-06-29 DIAGNOSIS — Z20828 Contact with and (suspected) exposure to other viral communicable diseases: Secondary | ICD-10-CM | POA: Insufficient documentation

## 2019-06-29 DIAGNOSIS — Z01812 Encounter for preprocedural laboratory examination: Secondary | ICD-10-CM | POA: Diagnosis not present

## 2019-06-30 ENCOUNTER — Other Ambulatory Visit: Payer: Self-pay | Admitting: *Deleted

## 2019-06-30 DIAGNOSIS — R0609 Other forms of dyspnea: Secondary | ICD-10-CM

## 2019-06-30 DIAGNOSIS — Z955 Presence of coronary angioplasty implant and graft: Secondary | ICD-10-CM

## 2019-06-30 DIAGNOSIS — R9439 Abnormal result of other cardiovascular function study: Secondary | ICD-10-CM

## 2019-06-30 LAB — NOVEL CORONAVIRUS, NAA (HOSP ORDER, SEND-OUT TO REF LAB; TAT 18-24 HRS): SARS-CoV-2, NAA: NOT DETECTED

## 2019-06-30 NOTE — Progress Notes (Signed)
Catheterization cath  ordered

## 2019-07-01 ENCOUNTER — Ambulatory Visit (HOSPITAL_COMMUNITY): Payer: Medicaid Other

## 2019-07-01 ENCOUNTER — Other Ambulatory Visit: Payer: Medicaid Other | Admitting: *Deleted

## 2019-07-01 ENCOUNTER — Other Ambulatory Visit: Payer: Self-pay

## 2019-07-01 ENCOUNTER — Telehealth: Payer: Self-pay | Admitting: *Deleted

## 2019-07-01 DIAGNOSIS — Z01818 Encounter for other preprocedural examination: Secondary | ICD-10-CM

## 2019-07-01 DIAGNOSIS — R9439 Abnormal result of other cardiovascular function study: Secondary | ICD-10-CM

## 2019-07-01 DIAGNOSIS — I25119 Atherosclerotic heart disease of native coronary artery with unspecified angina pectoris: Secondary | ICD-10-CM

## 2019-07-01 LAB — BASIC METABOLIC PANEL
BUN/Creatinine Ratio: 20 (ref 12–28)
BUN: 16 mg/dL (ref 8–27)
CO2: 26 mmol/L (ref 20–29)
Calcium: 9.2 mg/dL (ref 8.7–10.3)
Chloride: 108 mmol/L — ABNORMAL HIGH (ref 96–106)
Creatinine, Ser: 0.8 mg/dL (ref 0.57–1.00)
GFR calc Af Amer: 91 mL/min/{1.73_m2} (ref >59)
GFR calc non Af Amer: 79 mL/min/{1.73_m2} (ref >59)
Glucose: 99 mg/dL (ref 65–99)
Potassium: 3.6 mmol/L (ref 3.5–5.2)
Sodium: 141 mmol/L (ref 134–144)

## 2019-07-01 LAB — CBC
Hematocrit: 39.6 % (ref 34.0–46.6)
Hemoglobin: 13.5 g/dL (ref 11.1–15.9)
MCH: 30.3 pg (ref 26.6–33.0)
MCHC: 34.1 g/dL (ref 31.5–35.7)
MCV: 89 fL (ref 79–97)
Platelets: 187 10*3/uL (ref 150–450)
RBC: 4.45 x10E6/uL (ref 3.77–5.28)
RDW: 13.8 % (ref 11.7–15.4)
WBC: 6.4 10*3/uL (ref 3.4–10.8)

## 2019-07-01 NOTE — Telephone Encounter (Signed)
Pt contacted pre-catheterization scheduled at [facility] for: Thursday July 02, 2019 12 noon Verified arrival time and place: [facility]) at: 10 AM   No solid food after midnight prior to cath, clear liquids until 5 AM day of procedure. Contrast allergy: no  Hold: Losartan-HCT-AM of procedure. Metformin-day of procedure and 48 hours post procedure.  Except hold medications AM meds can be  taken pre-cath with sip of water including: ASA 81 mg Plavix 75 mg  Confirmed patient has responsible adult to drive home post procedure and observe 24 hours after arriving home: yes  Currently, due to Covid-19 pandemic, only one support person will be allowed with patient. Must be the same support person for that patient's entire stay, will be screened and required to wear a mask. They will be asked to wait in the waiting room for the duration of the patient's stay.  Patients are required to wear a mask when they enter the hospital.      COVID-19 Pre-Screening Questions:  . In the past 7 to 10 days have you had a cough,  shortness of breath, headache, congestion, fever (100 or greater) body aches, chills, sore throat, or sudden loss of taste or sense of smell? no . Have you been around anyone with known Covid 19? no . Have you been around anyone who is awaiting Covid 19 test results in the past 7 to 10 days? no . Have you been around anyone who has been exposed to Covid 19, or has mentioned symptoms of Covid 19 within the past 7 to 10 days? no    I reviewed procedure/mask/visitor instructions, Covid-19 screening questions with patient, she verbalized understanding, thanked me for call. Pt states she did not know she should have had pre procedure BMP/CBC 06/29/19, she will go to [facility] lab this morning, I will order STAT BMP/CBC to be done this morning. Pt knows to wear a mask for lab this morning,not go anywhere else, and continue to quarantine at home until  going for procedure 07/02/19.

## 2019-07-02 ENCOUNTER — Encounter (HOSPITAL_COMMUNITY)
Admission: RE | Disposition: A | Discharge status: Home / Self Care | Payer: Self-pay | Source: Home / Self Care | Attending: Cardiology

## 2019-07-02 ENCOUNTER — Ambulatory Visit (HOSPITAL_COMMUNITY)
Admission: RE | Admit: 2019-07-02 | Discharge: 2019-07-02 | Disposition: A | Discharge status: Home / Self Care | Payer: Managed Care, Other (non HMO) | Attending: Cardiology | Admitting: Cardiology

## 2019-07-02 ENCOUNTER — Telehealth (HOSPITAL_COMMUNITY): Payer: Self-pay | Admitting: *Deleted

## 2019-07-02 ENCOUNTER — Other Ambulatory Visit: Payer: Self-pay

## 2019-07-02 DIAGNOSIS — G4733 Obstructive sleep apnea (adult) (pediatric): Secondary | ICD-10-CM | POA: Diagnosis not present

## 2019-07-02 DIAGNOSIS — K219 Gastro-esophageal reflux disease without esophagitis: Secondary | ICD-10-CM | POA: Diagnosis not present

## 2019-07-02 DIAGNOSIS — Z8249 Family history of ischemic heart disease and other diseases of the circulatory system: Secondary | ICD-10-CM | POA: Insufficient documentation

## 2019-07-02 DIAGNOSIS — I2510 Atherosclerotic heart disease of native coronary artery without angina pectoris: Secondary | ICD-10-CM

## 2019-07-02 DIAGNOSIS — I2584 Coronary atherosclerosis due to calcified coronary lesion: Secondary | ICD-10-CM | POA: Diagnosis not present

## 2019-07-02 DIAGNOSIS — Z888 Allergy status to other drugs, medicaments and biological substances status: Secondary | ICD-10-CM | POA: Insufficient documentation

## 2019-07-02 DIAGNOSIS — R002 Palpitations: Secondary | ICD-10-CM | POA: Diagnosis present

## 2019-07-02 DIAGNOSIS — Z87891 Personal history of nicotine dependence: Secondary | ICD-10-CM | POA: Insufficient documentation

## 2019-07-02 DIAGNOSIS — I25118 Atherosclerotic heart disease of native coronary artery with other forms of angina pectoris: Secondary | ICD-10-CM | POA: Insufficient documentation

## 2019-07-02 DIAGNOSIS — Q0700 Arnold-Chiari syndrome without spina bifida or hydrocephalus: Secondary | ICD-10-CM | POA: Insufficient documentation

## 2019-07-02 DIAGNOSIS — Z955 Presence of coronary angioplasty implant and graft: Secondary | ICD-10-CM | POA: Diagnosis not present

## 2019-07-02 DIAGNOSIS — Z880 Allergy status to penicillin: Secondary | ICD-10-CM | POA: Diagnosis not present

## 2019-07-02 DIAGNOSIS — R931 Abnormal findings on diagnostic imaging of heart and coronary circulation: Secondary | ICD-10-CM | POA: Diagnosis present

## 2019-07-02 DIAGNOSIS — I10 Essential (primary) hypertension: Secondary | ICD-10-CM | POA: Diagnosis not present

## 2019-07-02 DIAGNOSIS — Z6831 Body mass index (BMI) 31.0-31.9, adult: Secondary | ICD-10-CM | POA: Diagnosis not present

## 2019-07-02 DIAGNOSIS — E669 Obesity, unspecified: Secondary | ICD-10-CM | POA: Diagnosis not present

## 2019-07-02 DIAGNOSIS — R9431 Abnormal electrocardiogram [ECG] [EKG]: Secondary | ICD-10-CM | POA: Diagnosis not present

## 2019-07-02 DIAGNOSIS — Z881 Allergy status to other antibiotic agents status: Secondary | ICD-10-CM | POA: Diagnosis not present

## 2019-07-02 DIAGNOSIS — R0609 Other forms of dyspnea: Secondary | ICD-10-CM

## 2019-07-02 DIAGNOSIS — R9439 Abnormal result of other cardiovascular function study: Secondary | ICD-10-CM

## 2019-07-02 DIAGNOSIS — I208 Other forms of angina pectoris: Secondary | ICD-10-CM | POA: Diagnosis present

## 2019-07-02 DIAGNOSIS — Z95820 Peripheral vascular angioplasty status with implants and grafts: Secondary | ICD-10-CM

## 2019-07-02 HISTORY — PX: CORONARY STENT INTERVENTION: CATH118234

## 2019-07-02 HISTORY — PX: LEFT HEART CATH AND CORONARY ANGIOGRAPHY: CATH118249

## 2019-07-02 LAB — POCT ACTIVATED CLOTTING TIME
Activated Clotting Time: 252 seconds
Activated Clotting Time: 279 seconds

## 2019-07-02 SURGERY — LEFT HEART CATH AND CORONARY ANGIOGRAPHY
Anesthesia: LOCAL

## 2019-07-02 MED ORDER — CO Q10 200 MG PO CAPS: 400.0000 mg | ORAL_CAPSULE | Freq: Every day | ORAL | Status: DC

## 2019-07-02 MED ORDER — LIDOCAINE HCL (PF) 1 % IJ SOLN
INTRAMUSCULAR | Status: AC
  Filled 2019-07-02: qty 30

## 2019-07-02 MED ORDER — SODIUM CHLORIDE 0.9 % IV SOLN: INTRAVENOUS | Status: DC

## 2019-07-02 MED ORDER — SODIUM CHLORIDE 0.9 % WEIGHT BASED INFUSION: 1.0000 mL/kg/h | INTRAVENOUS | Status: DC

## 2019-07-02 MED ORDER — HEPARIN SODIUM (PORCINE) 1000 UNIT/ML IJ SOLN
INTRAMUSCULAR | Status: AC
  Filled 2019-07-02: qty 1

## 2019-07-02 MED ORDER — HEPARIN (PORCINE) IN NACL 1000-0.9 UT/500ML-% IV SOLN
INTRAVENOUS | Status: AC
  Filled 2019-07-02: qty 1000

## 2019-07-02 MED ORDER — NITROGLYCERIN 1 MG/10 ML FOR IR/CATH LAB
INTRA_ARTERIAL | Status: DC | PRN
  Administered 2019-07-02: 200 ug via INTRACORONARY

## 2019-07-02 MED ORDER — ASPIRIN EC 81 MG PO TBEC: 81.0000 mg | DELAYED_RELEASE_TABLET | Freq: Every day | ORAL | Status: DC

## 2019-07-02 MED ORDER — MORPHINE SULFATE (PF) 2 MG/ML IV SOLN: 1.0000 mg | INTRAVENOUS | Status: DC | PRN

## 2019-07-02 MED ORDER — SODIUM CHLORIDE 0.9% FLUSH: 3.0000 mL | Freq: Two times a day (BID) | INTRAVENOUS | Status: DC

## 2019-07-02 MED ORDER — LABETALOL HCL 5 MG/ML IV SOLN: 10.0000 mg | INTRAVENOUS | Status: DC | PRN

## 2019-07-02 MED ORDER — HEPARIN (PORCINE) IN NACL 1000-0.9 UT/500ML-% IV SOLN
INTRAVENOUS | Status: DC | PRN
  Administered 2019-07-02: 500 mL

## 2019-07-02 MED ORDER — NITROGLYCERIN 0.4 MG SL SUBL: 0.4000 mg | SUBLINGUAL_TABLET | SUBLINGUAL | Status: DC | PRN

## 2019-07-02 MED ORDER — VERAPAMIL HCL 2.5 MG/ML IV SOLN
INTRAVENOUS | Status: DC | PRN
  Administered 2019-07-02: 8 mL via INTRA_ARTERIAL

## 2019-07-02 MED ORDER — HYDRALAZINE HCL 20 MG/ML IJ SOLN: 10.0000 mg | INTRAMUSCULAR | Status: DC | PRN

## 2019-07-02 MED ORDER — MIDAZOLAM HCL 2 MG/2ML IJ SOLN
INTRAMUSCULAR | Status: AC
  Filled 2019-07-02: qty 2

## 2019-07-02 MED ORDER — LIDOCAINE HCL (PF) 1 % IJ SOLN
INTRAMUSCULAR | Status: DC | PRN
  Administered 2019-07-02: 2 mL

## 2019-07-02 MED ORDER — LOSARTAN POTASSIUM-HCTZ 100-12.5 MG PO TABS: 0.5000 | ORAL_TABLET | Freq: Every day | ORAL | Status: DC

## 2019-07-02 MED ORDER — IOHEXOL 350 MG/ML SOLN
INTRAVENOUS | Status: DC | PRN
  Administered 2019-07-02: 115 mL

## 2019-07-02 MED ORDER — ROSUVASTATIN CALCIUM 20 MG PO TABS: 20.0000 mg | ORAL_TABLET | Freq: Every day | ORAL | Status: DC

## 2019-07-02 MED ORDER — ACETAMINOPHEN 325 MG PO TABS
ORAL_TABLET | ORAL | Status: AC
  Filled 2019-07-02: qty 2

## 2019-07-02 MED ORDER — ONDANSETRON HCL 4 MG/2ML IJ SOLN: 4.0000 mg | Freq: Four times a day (QID) | INTRAMUSCULAR | Status: DC | PRN

## 2019-07-02 MED ORDER — SODIUM CHLORIDE 0.9 % WEIGHT BASED INFUSION
3.0000 mL/kg/h | INTRAVENOUS | Status: AC
  Administered 2019-07-02: 3 mL/kg/h via INTRAVENOUS

## 2019-07-02 MED ORDER — FENTANYL CITRATE (PF) 100 MCG/2ML IJ SOLN
INTRAMUSCULAR | Status: AC
  Filled 2019-07-02: qty 2

## 2019-07-02 MED ORDER — HEPARIN SODIUM (PORCINE) 1000 UNIT/ML IJ SOLN
INTRAMUSCULAR | Status: DC | PRN
  Administered 2019-07-02: 5000 [IU] via INTRAVENOUS
  Administered 2019-07-02: 2000 [IU] via INTRAVENOUS
  Administered 2019-07-02: 5000 [IU] via INTRAVENOUS

## 2019-07-02 MED ORDER — VERAPAMIL HCL 2.5 MG/ML IV SOLN
INTRAVENOUS | Status: AC
  Filled 2019-07-02: qty 2

## 2019-07-02 MED ORDER — NITROGLYCERIN 1 MG/10 ML FOR IR/CATH LAB
INTRA_ARTERIAL | Status: AC
  Filled 2019-07-02: qty 10

## 2019-07-02 MED ORDER — FENTANYL CITRATE (PF) 100 MCG/2ML IJ SOLN
INTRAMUSCULAR | Status: DC | PRN
  Administered 2019-07-02: 25 ug via INTRAVENOUS

## 2019-07-02 MED ORDER — CLOPIDOGREL BISULFATE 75 MG PO TABS: 75.0000 mg | ORAL_TABLET | Freq: Every day | ORAL | Status: DC

## 2019-07-02 MED ORDER — SODIUM CHLORIDE 0.9% FLUSH: 3.0000 mL | INTRAVENOUS | Status: DC | PRN

## 2019-07-02 MED ORDER — SODIUM CHLORIDE 0.9 % IV SOLN: 250.0000 mL | INTRAVENOUS | Status: DC | PRN

## 2019-07-02 MED ORDER — ACETAMINOPHEN 325 MG PO TABS
650.0000 mg | ORAL_TABLET | ORAL | Status: DC | PRN
  Administered 2019-07-02: 650 mg via ORAL

## 2019-07-02 MED ORDER — NEBIVOLOL HCL 2.5 MG PO TABS: 2.5000 mg | ORAL_TABLET | Freq: Every day | ORAL | Status: DC

## 2019-07-02 MED ORDER — MIDAZOLAM HCL 2 MG/2ML IJ SOLN
INTRAMUSCULAR | Status: DC | PRN
  Administered 2019-07-02 (×2): 1 mg via INTRAVENOUS

## 2019-07-02 SURGICAL SUPPLY — 14 items
CATH OPTITORQUE TIG 4.0 5F (CATHETERS) ×1 IMPLANT
CATH VISTA GUIDE 6FR XBLAD3.5 (CATHETERS) ×2 IMPLANT
DEVICE RAD COMP TR BAND LRG (VASCULAR PRODUCTS) ×1 IMPLANT
GLIDESHEATH SLEND SS 6F .021 (SHEATH) ×1 IMPLANT
GUIDEWIRE INQWIRE 1.5J.035X260 (WIRE) IMPLANT
INQWIRE 1.5J .035X260CM (WIRE) ×2
KIT ENCORE 26 ADVANTAGE (KITS) ×1 IMPLANT
KIT HEART LEFT (KITS) ×2 IMPLANT
PACK CARDIAC CATHETERIZATION (CUSTOM PROCEDURE TRAY) ×2 IMPLANT
SHEATH PROBE COVER 6X72 (BAG) ×1 IMPLANT
STENT RESOLUTE ONYX 3.5X8 (Permanent Stent) ×1 IMPLANT
TRANSDUCER W/STOPCOCK (MISCELLANEOUS) ×2 IMPLANT
TUBING CIL FLEX 10 FLL-RA (TUBING) ×2 IMPLANT
WIRE ASAHI PROWATER 180CM (WIRE) ×1 IMPLANT

## 2019-07-02 NOTE — Interval H&P Note (Signed)
History and Physical Interval Note:  07/02/2019 12:29 PM  Carrie Reynolds  has presented today for surgery, with the diagnosis of abnormal stress test.  The various methods of treatment have been discussed with the patient and family. After consideration of risks, benefits and other options for treatment, the patient has consented to  Procedure(s): LEFT HEART CATH AND CORONARY ANGIOGRAPHY (N/A) CORONARY STENT INTERVENTION (N/A) as a surgical intervention.  The patient's history has been reviewed, patient examined, no change in status, stable for surgery.  I have reviewed the patient's chart and labs.  Questions were answered to the patient's satisfaction.    Cath Lab Visit (complete for each Cath Lab visit)  Clinical Evaluation Leading to the Procedure:   ACS: No.  Non-ACS:    Anginal Classification: CCS II  Anti-ischemic medical therapy: Minimal Therapy (1 class of medications)  Non-Invasive Test Results: High-risk stress test findings: cardiac mortality >3%/year  Prior CABG: No previous CABG  [name]  (Previous updated H&P was not a full clinic note)

## 2019-07-02 NOTE — Interval H&P Note (Signed)
History and Physical Interval Note:  07/02/2019 10:56 AM  [name]  has presented today for surgery, with the diagnosis of abnormal stress test.  The various methods of treatment have been discussed with the patient and family. After consideration of risks, benefits and other options for treatment, the patient has consented to  Procedure(s): LEFT HEART CATH AND CORONARY ANGIOGRAPHY (N/A)  [place]   as a surgical intervention.  The patient's history has been reviewed, patient examined, no change in status, stable for surgery.  I have reviewed the patient's chart and labs.  Questions were answered to the patient's satisfaction.    Cath Lab Visit (complete for each Cath Lab visit)  Clinical Evaluation Leading to the Procedure:   ACS: No.  Non-ACS:    Anginal Classification: CCS II  Anti-ischemic medical therapy: Maximal Therapy (2 or more classes of medications)  Non-Invasive Test Results: High-risk stress test findings: cardiac mortality >3%/year  Prior CABG: No previous CABG   [name]

## 2019-07-02 NOTE — Discharge Summary (Signed)
Discharge Summary/SAME DAY PCI    Patient ID: Carrie Reynolds,  MRN: [id number], DOB/AGE: 03-26-1956 63 y.o.  Admit date: 07/02/2019 Discharge date: 07/02/2019  Primary Care Provider: Leonard Downing Primary Cardiologist: Dr. Ellyn Hack  Discharge Diagnoses    Principal Problem:   Abnormal nuclear cardiac imaging test Active Problems:   Rapid palpitations   CAD S/P DES PCI-proximal LAD   Abnormal finding on EKG   S/P drug eluting coronary stent placement   Allergies Allergies  Allergen Reactions  . Ceclor [Cefaclor] Hives and Rash  . Tetracyclines & Related Hives  . Lipitor [Atorvastatin Calcium] Cough  . Penicillins Swelling and Other (See Comments)    Has patient had a PCN reaction causing immediate rash, facial/tongue/throat swelling, SOB or lightheadedness with hypotension: No Has patient had a PCN reaction causing severe rash involving mucus membranes or skin necrosis: No Has patient had a PCN reaction that required hospitalization: Yes Has patient had a PCN reaction occurring within the last 10 years: No If all of the above answers are "NO", then may proceed with Cephalosporin use.  [name] Amoxicillin Rash  . Ampicillin Rash  . Lisinopril Cough  . Simvastatin Cough    Diagnostic Studies/Procedures    Cath: 07/02/19   The left ventricular systolic function is normal. The left ventricular ejection fraction is 55-65% by visual estimate.  LV end diastolic pressure is normal.  -------------------------------  Previously placed Prox LAD to Mid LAD drug eluting stent is widely patent with 55% stenosed side branch in 1st Sept.  Prox LAD lesion is 70% stenosed -likely plaque/dissection shift from previously placed stent, suggests proximal edge dissection..  A drug-eluting stent was successfully placed covering the new lesion in the overlapping the prior stent, using a STENT RESOLUTE ONYX 3.5X8. -Postdilated to 3.8 mm.  Post intervention, there is a 0%  residual stenosis.  Post intervention, the jailed septal perforator branch was pinched to 70% residual stenosis.  -----------------------------------------  Mid LAD lesion is 25% stenosed.  Ramus lesion is 30% stenosed.  RPAV lesion is 60% stenosed.   SUMMARY  Proximal stent edge dissection and proximal LAD -->  successfully covered with additional RESOLUTE ONYX DES 3.5 mm x 8 mm overlapping proximal edge of her stent (postdilated to 3.8 mm)  Otherwise no significant change to coronary anatomy from last cath.  Normal EF and EDP.   RECOMMENDATIONS  Anticipate same-day discharge if stable.  Continue current medications, however she is supposedly not taking Metformin.  We can discuss potential other options and follow-up. ? She was recently started on Bystolic, will continue ? Only able to tolerate moderate dose rosuvastatin, we are evaluating for possible PCSK9 inhibitor  [name], MD   Diagnostic Dominance: Right  Intervention    _____________   History of Present Illness     Carrie Reynolds is a 63 y.o. female with a PMH notable for CAD having angina with recent LAD PCI, along with obesity (s/p gastric bypass), OSA-CPAP who presented to the office for follow up.  Carrie Reynolds was seen for initial post cath follow-up on October 28 by Kerin Ransom, [place].  She indicated she no longer any chest discomfort but had noted palpitations both individual heartbeats and rapid heart rates usually occurring at rest fast heart rates with minimal exertion (heart regular 100 bpm just walking to the car). Was not discharged on beta-blocker secondary to bradycardia.  Heart rate in the office was 53. --> Zio patch ordered-had not been delivered as  of 05/28/2019 -> anticipated delivery was 06/05/2019.  Carrie Reynolds returns to the office because she had been having some weird episodes of profound dyspnea and unsteadiness.  She said that she really was having exertional  dyspnea and having lots of breathing issues.  She had not had any chest pain but was just not having any energy whatsoever.  She been noting lots of fluttering sensations in her chest.  Was having episodes of near syncope, extreme fatigue, irregular heartbeats and dyspnea but no chest pain.  She had episodes while at cardiac rehab where she had PVCs somewhat frequently with and without bigeminy and trigeminy.  Unfortunately, she was not on a beta-blocker on discharge because of bradycardia most notably post PCI with some vasovagal effect.  Although she noted irregular heartbeats, she has not noted fast heartbeats just a fluttering sensation.  No syncope or near syncope just dizziness and fatigue.  Although she not had any angina symptoms, she has had exertional dyspnea.  No PND, orthopnea or edema. Given her symptoms she was set up for outpatient lexiscan myoview which was abnormal with anterior wall perfusion defect. With this finding she was referred for outpatient cath.    Hospital Course     Underwent cardiac cath noted above with with noted proximal edge dissection of pLAD treated with PCI/DESx1. Otherwise no change in her anatomy.Normal EF and EDP. Plan for DAPT with ASA/Plavix for at least 6 months. Seen by cardiac rehab while in short stay. No complications post cath. Radial site stable. Instructions/precautions regarding cath site care given prior to discharge.    Carrie Reynolds was seen by Dr. Ellyn Hack and determined stable for discharge home. Follow up in the office has been arranged. Medications are listed below.   _____________  Discharge Vitals Blood pressure 124/63, pulse 66, temperature 97.9 F (36.6 C), temperature source Oral, resp. rate 16, height 5\' 8"  (1.727 m), weight 90.7 kg, SpO2 100 %.  Filed Weights   07/02/19 1002  Weight: 90.7 kg    Labs & Radiologic Studies    CBC Recent Labs    07/01/19 1224  WBC 6.4  HGB 13.5  HCT 39.6  MCV 89  PLT 123XX123   Basic  Metabolic Panel Recent Labs    07/01/19 1224  NA 141  K 3.6  CL 108*  CO2 26  GLUCOSE 99  BUN 16  CREATININE 0.80  CALCIUM 9.2   Liver Function Tests No results for input(s): AST, ALT, ALKPHOS, BILITOT, PROT, ALBUMIN in the last 72 hours. No results for input(s): LIPASE, AMYLASE in the last 72 hours. Cardiac Enzymes No results for input(s): CKTOTAL, CKMB, CKMBINDEX, TROPONINI in the last 72 hours. BNP Invalid input(s): POCBNP D-Dimer No results for input(s): DDIMER in the last 72 hours. Hemoglobin A1C No results for input(s): HGBA1C in the last 72 hours. Fasting Lipid Panel No results for input(s): CHOL, HDL, LDLCALC, TRIG, CHOLHDL, LDLDIRECT in the last 72 hours. Thyroid Function Tests No results for input(s): TSH, T4TOTAL, T3FREE, THYROIDAB in the last 72 hours.  Invalid input(s): FREET3 _____________  CARDIAC CATHETERIZATION  Result Date: 07/02/2019  The left ventricular systolic function is normal. The left ventricular ejection fraction is 55-65% by visual estimate.  LV end diastolic pressure is normal.  -------------------------------  Previously placed Prox LAD to Mid LAD drug eluting stent is widely patent with 55% stenosed side branch in 1st Sept.  Prox LAD lesion is 70% stenosed -likely plaque/dissection shift from previously placed stent, suggests proximal edge dissection.[name]  A drug-eluting stent was successfully placed covering the new lesion in the overlapping the prior stent, using a STENT RESOLUTE ONYX 3.5X8. -Postdilated to 3.8 mm.  Post intervention, there is a 0% residual stenosis.  Post intervention, the jailed septal perforator branch was pinched to 70% residual stenosis.  -----------------------------------------  Mid LAD lesion is 25% stenosed.  Ramus lesion is 30% stenosed.  RPAV lesion is 60% stenosed.  SUMMARY  Proximal stent edge dissection and proximal LAD -->  successfully covered with additional RESOLUTE ONYX DES 3.5 mm x 8 mm overlapping  proximal edge of her stent (postdilated to 3.8 mm)  Otherwise no significant change to coronary anatomy from last cath.  Normal EF and EDP. RECOMMENDATIONS  Anticipate same-day discharge if stable.  Continue current medications, however she is supposedly not taking Metformin.  We can discuss potential other options and follow-up.  She was recently started on Bystolic, will continue  Only able to tolerate moderate dose rosuvastatin, we are evaluating for possible PCSK9 inhibitor She has follow-up appointment scheduled with me on Monday, December 14 [name], MD   [facility] PERFUSION IMAGING  Result Date: 06/24/2019  The left ventricular ejection fraction is mildly decreased (45-54%).  Nuclear stress EF: 51%.  No T wave inversion was noted during stress.  There was no ST segment deviation noted during stress.  Defect 1: There is a large defect of moderate severity.  Findings consistent with ischemia.  This is a high risk study.  Large size, moderate intensity reversible anterior wall perfusion defect (SDS 5) suggestive of LAD territory ischemia (noted prior DES to LAD). LVEF 51% with anterior hypokinesis. This is a high risk study given a higher risk territory defect. Clinical correlation and additional testing is recommended.   LONG TERM MONITOR (3-14 DAYS)  Result Date: 06/24/2019  Predominant rhythm is sinus rhythm. Minimum heart rate 45 bpm. Maximum 145 bpm. Average heart rate 79 bpm.  No evidence of arrhythmias: No SVT, atrial flutter, atrial fib, ventricular tachycardia or ventricular fibrillation.  Minimal PACs and PVCs. There were rare episodes of couplets and triplets of PACs and PVCs.  Essentially normal monitor.  No evidence of any abnormal findings.  Suspect that she may be feeling the PACs and PVCs, but nothing more aggressive to treat.  PVC burden is not significant. This is great news. [name], MD  Disposition   Pt is being discharged home today in good  condition.  Follow-up Plans & Appointments    Follow-up Information    Leonie Man, MD Follow up on 07/06/2019.   Specialty: Cardiology Why: at 11:40am for your follow up appt.  Contact information: [place] [address] [phone]           Discharge Medications     Medication List    STOP taking these medications   ibuprofen 800 MG tablet Commonly known as: ADVIL     TAKE these medications   aspirin EC 81 MG tablet Take 1 tablet (81 mg total) by mouth daily.   clopidogrel 75 MG tablet Commonly known as: PLAVIX Take 1 tablet (75 mg total) by mouth daily.   Co Q10 200 MG Caps Take 400 mg by mouth daily.   DHEA 25 MG Caps Take 25 mg by mouth daily.   estradiol 2 MG tablet Commonly known as: ESTRACE Take 2 mg by mouth daily.   losartan-hydrochlorothiazide 100-12.5 MG tablet Commonly known as: HYZAAR Take 0.5 tablets by mouth daily.   Magnesium 250 MG  Tabs Take 250 mg by mouth daily.   MULTIVITAMIN WOMEN PO Take 1 tablet by mouth daily.   nebivolol 2.5 MG tablet Commonly known as: Bystolic Take 1 tablet (2.5 mg total) by mouth daily.   nitroGLYCERIN 0.4 MG SL tablet Commonly known as: Nitrostat Place 1 tablet (0.4 mg total) under the tongue every 5 (five) minutes as needed.   pantoprazole 40 MG tablet Commonly known as: PROTONIX Take 40 mg by mouth as needed Jerrye Bushy).   progesterone 200 MG capsule Commonly known as: PROMETRIUM Take 200 mg by mouth at bedtime.   rosuvastatin 20 MG tablet Commonly known as: CRESTOR Take 1 tablet (20 mg total) by mouth daily.   vitamin B-12 1000 MCG tablet Commonly known as: CYANOCOBALAMIN Take 1,000 mcg by mouth daily.   Vitamin D3 125 MCG (5000 UT) Tabs Take 5,000 Units by mouth 2 (two) times daily.        No                               Did the patient have a percutaneous coronary intervention (stent / angioplasty)?:  Yes.     Cath/PCI Registry Performance &  Quality Measures: 4. Aspirin prescribed? - Yes 5. ADP Receptor Inhibitor (Plavix/Clopidogrel, Brilinta/Ticagrelor or Effient/Prasugrel) prescribed (includes medically managed patients)? - Yes 6. High Intensity Statin (Lipitor 40-80mg  or Crestor 20-40mg ) prescribed? - Yes 7. For EF <40%, was ACEI/ARB prescribed? - Not Applicable (EF >/= [id number]) 8. For EF <40%, Aldosterone Antagonist (Spironolactone or Eplerenone) prescribed? - Not Applicable (EF >/= [id number]) 9. Cardiac Rehab Phase II ordered (Included Medically managed Patients)? - Yes    Outstanding Labs/Studies   N/a   Duration of Discharge Encounter   Greater than 30 minutes including physician time.  Signed, Reino Bellis NP-C 07/02/2019, 1:23 PM

## 2019-07-02 NOTE — Telephone Encounter (Signed)
Spoke with [employer]. Patient is not interested in participating in phase 2 cardiac rehab. Will discharge from the [place], RN,BSN 07/02/2019 4:08 PM

## 2019-07-02 NOTE — Progress Notes (Signed)
[id number] Discussed importance of plavix with stent. Gave heart healthy diet, walking instructions for ex, reviewed NTG use, and wrist care. Referral to [place] CRP 2 done to meet requirement but pt would prefer not to attend.  Instructions given for walking until pt can return to pool ex. Understanding voiced by pt. Graylon Good RN BSN 07/02/2019 2:14 PM

## 2019-07-02 NOTE — Discharge Instructions (Signed)
Radial Site Care ° °This sheet gives you information about how to care for yourself after your procedure. Your health care provider may also give you more specific instructions. If you have problems or questions, contact your health care provider. °What can I expect after the procedure? °After the procedure, it is common to have: °· Bruising and tenderness at the catheter insertion area. °Follow these instructions at home: °Medicines °· Take over-the-counter and prescription medicines only as told by your health care provider. °Insertion site care °· Follow instructions from your health care provider about how to take care of your insertion site. Make sure you: °? Wash your hands with soap and water before you change your bandage (dressing). If soap and water are not available, use hand sanitizer. °? Change your dressing as told by your health care provider. °? Leave stitches (sutures), skin glue, or adhesive strips in place. These skin closures may need to stay in place for 2 weeks or longer. If adhesive strip edges start to loosen and curl up, you may trim the loose edges. Do not remove adhesive strips completely unless your health care provider tells you to do that. °· Check your insertion site every day for signs of infection. Check for: °? Redness, swelling, or pain. °? Fluid or blood. °? Pus or a bad smell. °? Warmth. °· Do not take baths, swim, or use a hot tub until your health care provider approves. °· You may shower 24-48 hours after the procedure, or as directed by your health care provider. °? Remove the dressing and gently wash the site with plain soap and water. °? Pat the area dry with a clean towel. °? Do not rub the site. That could cause bleeding. °· Do not apply powder or lotion to the site. °Activity ° °· For 24 hours after the procedure, or as directed by your health care provider: °? Do not flex or bend the affected arm. °? Do not push or pull heavy objects with the affected arm. °? Do not  drive yourself home from the hospital or clinic. You may drive 24 hours after the procedure unless your health care provider tells you not to. °? Do not operate machinery or power tools. °· Do not lift anything that is heavier than 10 lb (4.5 kg), or the limit that you are told, until your health care provider says that it is safe. °· Ask your health care provider when it is okay to: °? Return to work or school. °? Resume usual physical activities or sports. °? Resume sexual activity. °General instructions °· If the catheter site starts to bleed, raise your arm and put firm pressure on the site. If the bleeding does not stop, get help right away. This is a medical emergency. °· If you went home on the same day as your procedure, a responsible adult should be with you for the first 24 hours after you arrive home. °· Keep all follow-up visits as told by your health care provider. This is important. °Contact a health care provider if: °· You have a fever. °· You have redness, swelling, or yellow drainage around your insertion site. °Get help right away if: °· You have unusual pain at the radial site. °· The catheter insertion area swells very fast. °· The insertion area is bleeding, and the bleeding does not stop when you hold steady pressure on the area. °· Your arm or hand becomes pale, cool, tingly, or numb. °These symptoms may represent a serious problem   that is an emergency. Do not wait to see if the symptoms will go away. Get medical help right away. Call your local emergency services (911 in the [place] Do not drive yourself to the hospital. °Summary °· After the procedure, it is common to have bruising and tenderness at the site. °· Follow instructions from your health care provider about how to take care of your radial site wound. Check the wound every day for signs of infection. °· Do not lift anything that is heavier than 10 lb (4.5 kg), or the limit that you are told, until your health care provider says  that it is safe. °This information is not intended to replace advice given to you by your health care provider. Make sure you discuss any questions you have with your health care provider. °Document Released: 08/11/2010 Document Revised: 08/14/2017 Document Reviewed: 08/14/2017 °Elsevier Patient Education © 2020 [employer] ° °

## 2019-07-03 ENCOUNTER — Ambulatory Visit (HOSPITAL_COMMUNITY): Payer: Medicaid Other

## 2019-07-06 ENCOUNTER — Ambulatory Visit (INDEPENDENT_AMBULATORY_CARE_PROVIDER_SITE_OTHER): Payer: Managed Care, Other (non HMO) | Admitting: Cardiology

## 2019-07-06 ENCOUNTER — Ambulatory Visit (HOSPITAL_COMMUNITY): Payer: Medicaid Other

## 2019-07-06 ENCOUNTER — Encounter: Payer: Self-pay | Admitting: Cardiology

## 2019-07-06 ENCOUNTER — Other Ambulatory Visit: Payer: Self-pay

## 2019-07-06 VITALS — BP 111/72 | HR 50 | Temp 97.6°F | Ht 68.0 in | Wt 201.2 lb

## 2019-07-06 DIAGNOSIS — R931 Abnormal findings on diagnostic imaging of heart and coronary circulation: Secondary | ICD-10-CM

## 2019-07-06 DIAGNOSIS — Z9861 Coronary angioplasty status: Secondary | ICD-10-CM

## 2019-07-06 DIAGNOSIS — I10 Essential (primary) hypertension: Secondary | ICD-10-CM

## 2019-07-06 DIAGNOSIS — I2510 Atherosclerotic heart disease of native coronary artery without angina pectoris: Secondary | ICD-10-CM | POA: Diagnosis not present

## 2019-07-06 DIAGNOSIS — E785 Hyperlipidemia, unspecified: Secondary | ICD-10-CM

## 2019-07-06 DIAGNOSIS — I25119 Atherosclerotic heart disease of native coronary artery with unspecified angina pectoris: Secondary | ICD-10-CM

## 2019-07-06 DIAGNOSIS — R0600 Dyspnea, unspecified: Secondary | ICD-10-CM

## 2019-07-06 DIAGNOSIS — R0609 Other forms of dyspnea: Secondary | ICD-10-CM

## 2019-07-06 DIAGNOSIS — Z955 Presence of coronary angioplasty implant and graft: Secondary | ICD-10-CM

## 2019-07-06 DIAGNOSIS — R002 Palpitations: Secondary | ICD-10-CM

## 2019-07-06 NOTE — Patient Instructions (Addendum)
Medication Instructions:  Not needed *If you need a refill on your cardiac medications before your next appointment, please call your pharmacy*  Lab Work: Lipid  cmp  This month  4 months - lipid. cmp  If you have labs (blood work) drawn today and your tests are completely normal, you will receive your results only by: [name] MyChart Message (if you have MyChart) OR . A paper copy in the mail If you have any lab test that is abnormal or we need to change your treatment, we will call you to review the results.  Testing/Procedures: Not neeed  Follow-Up: At [facility], you and your health needs are our priority.  As part of our continuing mission to provide you with exceptional heart care, we have created designated Provider Care Teams.  These Care Teams include your primary Cardiologist (physician) and Advanced Practice Providers (APPs -  Physician Assistants and Nurse Practitioners) who all work together to provide you with the care you need, when you need it.  Your next appointment:   4 month(s)  The format for your next appointment:   Either In Person or Virtual  Provider:   [name], MD  Other Instructions

## 2019-07-06 NOTE — Progress Notes (Signed)
Primary Care Provider: Leonard Downing, MD Cardiologist: [name], MD Electrophysiologist:   Clinic Note: Chief Complaint  Patient presents with  . Hospitalization Follow-up    Post cath  . Coronary Artery Disease    Recent repeat PCI IV    HPI:    Carrie Reynolds is a 63 y.o. female with a PMH notable for CAD having angina with recent LAD PCI (with recent repeat PCI for proximal edge dissection-proximal overlapping stent), along with obesity (s/p gastric bypass), OSA-CPAP who presents today post cath follow-up.   Defne Cambridge was seen for early follow-up on June 16, 2019 (unfortunately prior to having her monitor results back) with profound exertional dyspnea and unsteadiness.  Also started him with a little chest discomfort.  Was fearful of going to cardiac rehab.  Had frequent PVCs noted.  Persistent concerning T wave inversions on EKG led me to order a Myoview STRESS TEST that was high risk with anterior ischemia.  She was then contacted and referred for cardiac catheterization relook-see below.  Recent Hospitalizations: None  Reviewed  CV studies:    The following studies were reviewed today: (if available, images/films reviewed: From Epic Chart or Care Everywhere) . LEFT HEART CATH-PCI 05/12/2019: Culprit-p-mLAD 95% 950% SP1) --> Scoring Balloon PTCA -> DES PCI (Resolute DES 3.5 x 38 - 3.8 mm; 0% LAD& 55% SP1).  RPAV 60% -small caliber vessel, did not appear flow-limiting (medical management).  EF 55 to 60%.  Normal LVEDP.  SP1 was jailed post PCI with TIMI I flow.  Patient had angina post PCI.   Event Monitor November 2020: Heart rate ranged from 44 to 107 bpm average 64 bpm.  Rare PVCs noted during sinus bradycardia but otherwise no significant ectopy.  No arrhythmias.  No symptoms noted.   Myoview June 24, 2019: HIGH RISK.  EF 55%.  Large size, moderate severe defect in the anterior wall with associated anterior hypokinesis.  Suspect LAD  territory.   LEFT HEART CATH-PCI 07/02/2019: Previous stent was widely patent, but there was a 70% proximal edge dissection noted which correlates with anterior ischemia. -->  Proximal overlapping resolute Onyx DES 3.5 mm x 8 mm; both current and old stent postdilated to 3.8 mm.  SP1 continue to be somewhat pinched with TIMI-3 flow.  Stable mLAD 25%, RI 30% & rPAV 60%. ER 55-65%.      Interval History:   Chrystle Gallus returns here today for post cath -PCI follow-up with her big smile on her face stating that she is feeling quite well now.  No more of the chest tightness and dyspnea symptoms.  She is looking forward to getting back into doing activity.  The fluttering sensation in her chest also seems to improved but is still there every now and then.  Overall, she just feels better and it is relieved to have the second step of the PCI complete.  Her right wrist is doing fine just a small bump at the access site but no bleeding.  No bruising.  She is doing well on aspirin and Plavix with no blood in her stools, dark tarry stools or blood in the urine. The level of fatigue has notably improved and is no longer any exertional dyspnea or chest discomfort.  Palpitations have also notably improved.  She has decided against going back to cardiac rehab, stating that she would just like to get back into her own exercise regimen.  CV Review of Symptoms (Summary) Cardiovascular ROS: positive for -  irregular heartbeat and Fluttering that is relatively rare. negative for - chest pain, dyspnea on exertion, edema, orthopnea, palpitations, paroxysmal nocturnal dyspnea, rapid heart rate, shortness of breath or TIA/amaurosis fugax, syncope/near syncope, claudication  The patient DOES NOT have symptoms concerning for COVID-19 infection (fever, chills, cough, or new shortness of breath).  The patient is practicing social distancing. ++ Masking.  She does go out for Groceries/shopping - appropriate distancing &  masking.   Has not yet started gone back to work, and has not gone back to cardiac rehab since setting this appointment.   REVIEWED OF SYSTEMS   A comprehensive ROS was performed. Review of Systems  Constitutional: Negative for chills, fever, malaise/fatigue and weight loss.  HENT: Negative for congestion and nosebleeds.   Respiratory: Positive for cough (Mild, nonproductive). Negative for shortness of breath (Notably improved per HPI).   Gastrointestinal: Negative for blood in stool, constipation, melena, nausea and vomiting.  Genitourinary: Negative for hematuria.  Musculoskeletal: Negative for joint pain.  Neurological: Positive for headaches. Negative for dizziness, tingling and focal weakness.  Endo/Heme/Allergies: Negative for environmental allergies.  Psychiatric/Behavioral: Negative for depression and memory loss. The patient is not nervous/anxious and does not have insomnia (Started sleeping much better post PCI).   All other systems reviewed and are negative.  I have reviewed and (if needed) personally updated the patient's problem list, medications, allergies, past medical and surgical history, social and family history.   PAST MEDICAL HISTORY   Past Medical History:  Diagnosis Date  . Anxiety   . Arnold-Chiari malformation ([place]) 2006  . CAD S/P PCI 05/06/2019   05/06/2019: prox LAD (@ SPI) PCI with Resolute Onyx DES 3.5  x 8, Residual 60% PDA, Normal LVF; 07/02/2019: relook Cath for Anterior Ischemia on Mhoview --> CATH 70% pre-stent/proximal edge dissection --> proximal overlapping DES (Resolute Onyx 3.5 x 8 - new & old stent post-dilated to 3.8 mm)  . Cervical strain    syndrome  . Cervicogenic headache   . Concussion with loss of consciousness 10/09/2017  . CTS (carpal tunnel syndrome)   . Depression   . GERD (gastroesophageal reflux disease)   . Glucose intolerance (impaired glucose tolerance)   . High coronary artery calcium score    Score of 1460.  Coronary CTA  shows high-grade proximal-mid LAD stenosis (CT FFR 0.50).  Also PDA/PLA 60 to 70% stenosis (CTO FFR 0.75)  . History of kidney stones   . Hypertension   . Migraine headache    with visual aura  . Obesity   . OSA (obstructive sleep apnea)    not treated  . Postmenopausal   . Vertigo    post concussive    PAST SURGICAL HISTORY   Past Surgical History:  Procedure Laterality Date  . ABDOMINAL HYSTERECTOMY  2003   partial- L ovary remains  . ABDOMINAL HYSTERECTOMY    . BUNIONECTOMY     right great toe  . CHOLECYSTECTOMY N/A 06/05/2018   Procedure: LAPAROSCOPIC CHOLECYSTECTOMY WITH POSSIBLE INTRAOPERATIVE CHOLANGIOGRAM;  Surgeon: Erroll Luna, MD;  Location: [place];  Service: General;  Laterality: N/A;  . CORONARY STENT INTERVENTION N/A 05/12/2019   Procedure: CORONARY STENT INTERVENTION;  Surgeon: Leonie Man, MD;  Location: [place] [facility];  Service: Cardiovascular;;; Culprit-p-mLAD 95% 950% SP1) --> Scoring Balloon PTCA -> DES PCI (Resolute DES 3.5 x 38 - 3.8 mm; 0% LAD& 55% SP1). -->  SP1 was somewhat jailed with TIMI II flow post PCI.  [name] CORONARY STENT INTERVENTION N/A 07/02/2019  Procedure: CORONARY STENT INTERVENTION;  Surgeon: Leonie Man, MD;  Location: [place] [facility];  Service: Cardiovascular;; pLAD proximal stent edge dissection 70% --> Proximal overlapping resolute Onyx DES 3.5 mm x 8 mm; both current and old stent postdilated to 3.8 mm.; jailed SP1 ~70% -TIMI 3 flow.  [name] GASTRIC BYPASS  11/2015   [facility]  . HAND SURGERY    . LEFT HEART CATH AND CORONARY ANGIOGRAPHY N/A 05/12/2019   Procedure: LEFT HEART CATH AND CORONARY ANGIOGRAPHY;  Surgeon: Leonie Man, MD;  Location: [place] [facility];  Service: Cardiovascular;;; Culprit-p-mLAD 95% 950% SP1) --> DES PCI.;  RPAV 60% -small caliber vessel, did not appear flow-limiting (medical management).  EF 55 to 60%.  Normal LVEDP.  [name] LEFT HEART CATH AND CORONARY ANGIOGRAPHY N/A 07/02/2019   Procedure: LEFT HEART  CATH AND CORONARY ANGIOGRAPHY;  Surgeon: Leonie Man, MD;  Location: [facility] [place] [facility];; Previous stent was widely patent, but there was a 70% proximal edge dissection noted which correlates with anterior ischemia. --> prox Overlapping DES PCI.  SP1 ~70%, TIMI 3 flow. Stable mLAD 25%, RI 30% & rPAV 60%. ER 55-65%.   [name] NM MYOVIEW LTD  06/24/2019   Carlton Adam): HIGH RISK.  EF 55%.  Large size, moderate severe defect in the anterior wall with associated anterior hypokinesis.  Suspect LAD territory.  . TUBAL LIGATION Bilateral    Relook CATH-PCI 07/02/2019: proxLAD proximal edge dissection - covered with 2nd Resolute Onyx DES 3.5 x 8 (post-dilated to 3.8 mm)    MEDICATIONS/ALLERGIES   Current Meds  Medication Sig  . ARMOUR THYROID 60 MG tablet Take 60 mg by mouth daily.  [name] aspirin EC 81 MG tablet Take 1 tablet (81 mg total) by mouth daily.  . Cholecalciferol (VITAMIN D3) 125 MCG (5000 UT) TABS Take 5,000 Units by mouth 2 (two) times daily.   . clopidogrel (PLAVIX) 75 MG tablet Take 1 tablet (75 mg total) by mouth daily.  . Coenzyme Q10 (CO Q10) 200 MG CAPS Take 400 mg by mouth daily.   [name] DHEA 25 MG CAPS Take 25 mg by mouth daily.   [name] losartan-hydrochlorothiazide (HYZAAR) 100-12.5 MG tablet Take 0.5 tablets by mouth daily.  . Magnesium 250 MG TABS Take 250 mg by mouth daily.   . Multiple Vitamins-Minerals (MULTIVITAMIN WOMEN PO) Take 1 tablet by mouth daily.  . nebivolol (BYSTOLIC) 2.5 MG tablet Take 1 tablet (2.5 mg total) by mouth daily.  . nitroGLYCERIN (NITROSTAT) 0.4 MG SL tablet Place 1 tablet (0.4 mg total) under the tongue every 5 (five) minutes as needed.  . pantoprazole (PROTONIX) 40 MG tablet Take 40 mg by mouth as needed Jerrye Bushy).   . progesterone (PROMETRIUM) 200 MG capsule Take 200 mg by mouth at bedtime.  . rosuvastatin (CRESTOR) 20 MG tablet Take 1 tablet (20 mg total) by mouth daily.  . vitamin B-12 (CYANOCOBALAMIN) 1000 MCG tablet Take 1,000 mcg by mouth daily.  .  [DISCONTINUED] estradiol (ESTRACE) 2 MG tablet Take 2 mg by mouth daily.    Allergies  Allergen Reactions  . Ceclor [Cefaclor] Hives and Rash  . Tetracyclines & Related Hives  . Lipitor [Atorvastatin Calcium] Cough  . Penicillins Swelling and Other (See Comments)    Has patient had a PCN reaction causing immediate rash, facial/tongue/throat swelling, SOB or lightheadedness with hypotension: No Has patient had a PCN reaction causing severe rash involving mucus membranes or skin necrosis: No Has patient had a PCN reaction that required hospitalization: Yes  Has patient had a PCN reaction occurring within the last 10 years: No If all of the above answers are "NO", then may proceed with Cephalosporin use.  [name] Amoxicillin Rash  . Ampicillin Rash  . Lisinopril Cough  . Simvastatin Cough     SOCIAL HISTORY/FAMILY HISTORY   Social History   Tobacco Use  . Smoking status: Former Smoker    Packs/day: 0.10    Years: 30.00    Pack years: 3.00    Types: Cigarettes    Quit date: 04/26/2012    Years since quitting: 7.2  . Smokeless tobacco: Never Used  Substance Use Topics  . Alcohol use: Yes    Comment: occasional  . Drug use: No   Social History   Social History Narrative   ** Merged History Encounter **       Lives with husband [name]). She travels very frequently with her job and is gone most weeks out of the month. She is active and exercises when able.    Family History family history includes Alzheimer's disease in her father and paternal grandmother; Cancer in her father and paternal grandfather; Diabetes Mellitus II in her sister; Hyperlipidemia in her sister; Hypertension in her mother and sister; Migraines in her father.   OBJCTIVE -PE, EKG, labs   Wt Readings from Last 3 Encounters:  07/06/19 201 lb 3.2 oz (91.3 kg)  07/02/19 200 lb (90.7 kg)  06/24/19 200 lb (90.7 kg)    Physical Exam: BP 111/72   Pulse (!) 50   Temp 97.6 F (36.4 C)   Ht 5\' 8"  (1.727 m)    Wt 201 lb 3.2 oz (91.3 kg)   SpO2 93%   BMI 30.59 kg/m  Physical Exam  Constitutional: She is oriented to person, place, and time. She appears well-developed and well-nourished. No distress.  Well-groomed.  Healthy-appearing.  In great spirits.  HENT:  Head: Normocephalic and atraumatic.  Eyes: EOM are normal.  Neck: Normal carotid pulses, no hepatojugular reflux and no JVD present. Carotid bruit is not present. No tracheal deviation present.  Cardiovascular: Normal rate, regular rhythm, normal heart sounds and intact distal pulses.  Occasional extrasystoles are present. PMI is not displaced. Exam reveals no gallop and no friction rub.  No murmur heard. Pulmonary/Chest: Effort normal and breath sounds normal. No respiratory distress. She has no wheezes. She has no rales.  Abdominal: Soft. Bowel sounds are normal. She exhibits no distension. There is no abdominal tenderness. There is no rebound.  Musculoskeletal:        General: No edema. Normal range of motion.     Cervical back: Normal range of motion and neck supple.     Comments: Radial cath site is hidden in her tattoo.  There is a small firm nodular swelling at the access site, but otherwise no ecchymosis or tenderness.  Neurological: She is alert and oriented to person, place, and time.  Psychiatric: She has a normal mood and affect. Her behavior is normal. Judgment and thought content normal.  Relaxed, good spirits  Vitals reviewed.   Adult ECG Report n/a  Recent Labs:    --From 2020: TC 234, TG 80.5, HDL 33, LDL 136-->LDL-P22056* (Very High >2000, goal <1000), HDL-P 35.4 (goal> 30.5).Small LDL-P9 51 (high.Goal<527). LDL size 20.9nm (goal >20.5).LP-IR score 74 (high,goal<45)  Lab Results  Component Value Date   CREATININE 0.80 07/01/2019   BUN 16 07/01/2019   NA 141 07/01/2019   K 3.6 07/01/2019   CL 108 (H) 07/01/2019  CO2 26 07/01/2019    ASSESSMENT/PLAN    Problem List Items Addressed This Visit     CAD S/P DES PCI-proximal LAD (Chronic)    Likely either plaque shift or proximal edge dissection of the additional stent covered with a proximal overlapping short 8 mm stent with excellent result.  No further symptoms.  Plan: Converted from Brilinta to Plavix because of breathing issues.  Now on maintenance dose Plavix.    Continue uninterrupted Plavix for 6 months starting from the December PCI.  Okay to hold aspirin off and on for procedures. As of June 2021, would be able to hold Plavix (and aspirin) 5 to 7 days preop for procedures.      Coronary artery disease involving native coronary artery of native heart with angina pectoris ([place]) - Primary (Chronic)    The most notable lesion was the first heart catheterization as discussed based on coronary CTA findings.  Second time around, she was having some atypical symptoms but is noticing exertional dyspnea, fatigue and some chest tightness with persistent abnormal EKG.  We checked a Myoview which clearly showed anterior ischemia suggesting an issue with the new stent.  Taken back down to have a proximal edge dissection that was covered with an overlapping DES stent.  Now doing very well only has moderate disease in the RPA V-PL.  We will treat that medically.  Plan: Is on aspirin and Plavix with no bleeding issues.--Would like to have uninterrupted Plavix for 6 months if possible, after which time it could be potentially held for procedures.  Continue beta-blocker and ARB along with statin.  May need to titrate dose of statin based on lipids.      Relevant Orders   Lipid panel   Comprehensive metabolic panel   Lipid panel   Comprehensive metabolic panel   Essential hypertension (Chronic)    Blood pressure looks great here today.  Certainly no need to titrate any further.  She is on low-dose beta-blocker, and since starting that, she said the palpitations have notably improved.  She is also on stable losartan-HCTZ dose.      Dyslipidemia,  goal LDL below 70 (closer to 55) (Chronic)    On 20 mg rosuvastatin.  She is due to have labs checked now (will be done within the next week.  We can then recheck in 4 months as we may need to adjust management-this is based on findings from earlier this year.      Relevant Orders   Lipid panel   Comprehensive metabolic panel   Lipid panel   Comprehensive metabolic panel   Rapid palpitations (Chronic)    Nothing really noted on the monitor.  I suspect that she has an ectopy.  Much better controlled post PCI and after starting Bystolic.      DOE (dyspnea on exertion)   Abnormal nuclear cardiac imaging test    Extremely accurate finding on my review based on what we saw in the calf.  Would suspect that there may still be some septal defect in the very proximal part of the anterior wall due to jailed septal perforator, but otherwise would expect normalization of ischemia.         COVID-19 Education: The signs and symptoms of COVID-19 were discussed with the patient and how to seek care for testing (follow up with PCP or arrange E-visit).   The importance of social distancing was discussed today.  I spent a total of 18 minutes with the patient and chart review. >  50% of the time was spent in direct patient consultation.  Additional time spent with chart review (studies, outside notes, etc): 10 Total Time: 28 min   Current medicines are reviewed at length with the patient today.  (+/- concerns) none   Patient Instructions / Medication Changes & Studies & Tests Ordered   Patient Instructions  Medication Instructions:  Not needed *If you need a refill on your cardiac medications before your next appointment, please call your pharmacy*  Lab Work: Lipid  cmp  This month  4 months - lipid. cmp  If you have labs (blood work) drawn today and your tests are completely normal, you will receive your results only by: [name] MyChart Message (if you have MyChart) OR . A paper copy in the  mail If you have any lab test that is abnormal or we need to change your treatment, we will call you to review the results.  Testing/Procedures: Not neeed  Follow-Up: At [facility], you and your health needs are our priority.  As part of our continuing mission to provide you with exceptional heart care, we have created designated Provider Care Teams.  These Care Teams include your primary Cardiologist (physician) and Advanced Practice Providers (APPs -  Physician Assistants and Nurse Practitioners) who all work together to provide you with the care you need, when you need it.  Your next appointment:   4 month(s)  The format for your next appointment:   Either In Person or Virtual  Provider:   [name], MD  Other Instructions    Studies Ordered:   Orders Placed This Encounter  Procedures  . Lipid panel  . Comprehensive metabolic panel  . Lipid panel  . Comprehensive metabolic panel     [name], M.D., M.S. Interventional Cardiologist   Pager # [phone] Phone # [phone] [address]. [place], [place] [address]   Thank you for choosing [facility] at [facility]!!

## 2019-07-07 ENCOUNTER — Encounter: Payer: Self-pay | Admitting: Cardiology

## 2019-07-07 ENCOUNTER — Other Ambulatory Visit: Payer: Self-pay | Admitting: Cardiology

## 2019-07-07 NOTE — Assessment & Plan Note (Addendum)
Likely either plaque shift or proximal edge dissection of the additional stent covered with a proximal overlapping short 8 mm stent with excellent result.  No further symptoms.  Plan: Converted from Brilinta to Plavix because of breathing issues.  Now on maintenance dose Plavix.    Continue uninterrupted Plavix for 6 months starting from the December PCI.  Okay to hold aspirin off and on for procedures. As of June 2021, would be able to hold Plavix (and aspirin) 5 to 7 days preop for procedures.

## 2019-07-07 NOTE — Assessment & Plan Note (Signed)
Nothing really noted on the monitor.  I suspect that she has an ectopy.  Much better controlled post PCI and after starting Bystolic.

## 2019-07-07 NOTE — Assessment & Plan Note (Signed)
Blood pressure looks great here today.  Certainly no need to titrate any further.  She is on low-dose beta-blocker, and since starting that, she said the palpitations have notably improved.  She is also on stable losartan-HCTZ dose.

## 2019-07-07 NOTE — Assessment & Plan Note (Signed)
Extremely accurate finding on my review based on what we saw in the calf.  Would suspect that there may still be some septal defect in the very proximal part of the anterior wall due to jailed septal perforator, but otherwise would expect normalization of ischemia.

## 2019-07-07 NOTE — Assessment & Plan Note (Signed)
On 20 mg rosuvastatin.  She is due to have labs checked now (will be done within the next week.  We can then recheck in 4 months as we may need to adjust management-this is based on findings from earlier this year.

## 2019-07-07 NOTE — Assessment & Plan Note (Signed)
The most notable lesion was the first heart catheterization as discussed based on coronary CTA findings.  Second time around, she was having some atypical symptoms but is noticing exertional dyspnea, fatigue and some chest tightness with persistent abnormal EKG.  We checked a Myoview which clearly showed anterior ischemia suggesting an issue with the new stent.  Taken back down to have a proximal edge dissection that was covered with an overlapping DES stent.  Now doing very well only has moderate disease in the RPA V-PL.  We will treat that medically.  Plan: Is on aspirin and Plavix with no bleeding issues.--Would like to have uninterrupted Plavix for 6 months if possible, after which time it could be potentially held for procedures.  Continue beta-blocker and ARB along with statin.  May need to titrate dose of statin based on lipids.

## 2019-07-08 ENCOUNTER — Ambulatory Visit (HOSPITAL_COMMUNITY): Payer: Medicaid Other

## 2019-07-10 ENCOUNTER — Ambulatory Visit (HOSPITAL_COMMUNITY): Payer: Medicaid Other

## 2019-07-13 ENCOUNTER — Ambulatory Visit (HOSPITAL_COMMUNITY): Payer: Medicaid Other

## 2019-07-15 ENCOUNTER — Ambulatory Visit (HOSPITAL_COMMUNITY): Payer: Medicaid Other

## 2019-07-15 NOTE — Telephone Encounter (Signed)
Spoke to patient. Patient states she is having these symptoms  With and wit out activity . She has not used any NTG.  Still having shortness of breath walking  Treadmill and have not start using the pool yet. Patient is little concerned since holidays are approaching. Appointment made for tomorrow. Patient verbalized understanding.

## 2019-07-16 ENCOUNTER — Encounter: Payer: Self-pay | Admitting: Adult Health

## 2019-07-16 ENCOUNTER — Ambulatory Visit (INDEPENDENT_AMBULATORY_CARE_PROVIDER_SITE_OTHER): Payer: Managed Care, Other (non HMO) | Admitting: Adult Health

## 2019-07-16 ENCOUNTER — Other Ambulatory Visit: Payer: Self-pay

## 2019-07-16 VITALS — BP 112/75 | HR 54 | Ht 68.0 in | Wt 201.4 lb

## 2019-07-16 DIAGNOSIS — E785 Hyperlipidemia, unspecified: Secondary | ICD-10-CM | POA: Diagnosis not present

## 2019-07-16 DIAGNOSIS — I10 Essential (primary) hypertension: Secondary | ICD-10-CM | POA: Diagnosis not present

## 2019-07-16 DIAGNOSIS — Z79899 Other long term (current) drug therapy: Secondary | ICD-10-CM

## 2019-07-16 DIAGNOSIS — I25119 Atherosclerotic heart disease of native coronary artery with unspecified angina pectoris: Secondary | ICD-10-CM | POA: Diagnosis not present

## 2019-07-16 NOTE — Progress Notes (Signed)
Cardiology Office Note   Date:  07/16/2019   ID:  Vonnetta, Schmieg May 14, 1956, MRN [id number]  PCP:  Leonard Downing, MD  Cardiologist: Dr. Ellyn Hack  No chief complaint on file.    History of Present Illness: Carrie Reynolds is a 63 y.o. female who presents for ongoing assessment and management of coronary artery disease status post PCI to the LAD, with recent repeat PCI for proximal edge dissection-proximal overlapping stent, OSA on CPAP, hypercholesterolemia, obesity status post gastric bypass surgery.  She was seen last by Dr. Ellyn Hack on 07/06/2019 with complaining of's fluttering sensations in her chest, and generalized fatigue.  She was taken off of Brilinta and started on Plavix 75 mg daily due to persistent issues with breathing she is to continue on uninterrupted Plavix for a minimum of 6 months, with Dr. Ellyn Hack noting that as of June 2021 she would be able to hold Plavix and aspirin 5 to 7 days for any preop procedure.  The patient is planning on having a tummy tuck and liposuction after losing significant amount of weight from gastric bypass surgery.  She continues to complain of some fatigue and she is trying to go back to the gym and get her stamina back.  She is pushing herself hard in order to avoid gaining weight as she is afraid that this will occur since she has not been active around her coronary artery disease, and interventions.  She is also complaining of some sciatic pain on the right.  Past Medical History:  Diagnosis Date  . Anxiety   . Arnold-Chiari malformation ([place]) 2006  . CAD S/P PCI 05/06/2019   05/06/2019: prox LAD (@ SPI) PCI with Resolute Onyx DES 3.5  x 8, Residual 60% PDA, Normal LVF; 07/02/2019: relook Cath for Anterior Ischemia on Mhoview --> CATH 70% pre-stent/proximal edge dissection --> proximal overlapping DES (Resolute Onyx 3.5 x 8 - new & old stent post-dilated to 3.8 mm)  . Cervical strain    syndrome  . Cervicogenic headache   .  Concussion with loss of consciousness 10/09/2017  . CTS (carpal tunnel syndrome)   . Depression   . GERD (gastroesophageal reflux disease)   . Glucose intolerance (impaired glucose tolerance)   . High coronary artery calcium score    Score of 1460.  Coronary CTA shows high-grade proximal-mid LAD stenosis (CT FFR 0.50).  Also PDA/PLA 60 to 70% stenosis (CTO FFR 0.75)  . History of kidney stones   . Hypertension   . Migraine headache    with visual aura  . Obesity   . OSA (obstructive sleep apnea)    not treated  . Postmenopausal   . Vertigo    post concussive    Past Surgical History:  Procedure Laterality Date  . ABDOMINAL HYSTERECTOMY  2003   partial- L ovary remains  . ABDOMINAL HYSTERECTOMY    . BUNIONECTOMY     right great toe  . CHOLECYSTECTOMY N/A 06/05/2018   Procedure: LAPAROSCOPIC CHOLECYSTECTOMY WITH POSSIBLE INTRAOPERATIVE CHOLANGIOGRAM;  Surgeon: Erroll Luna, MD;  Location: [place];  Service: General;  Laterality: N/A;  . CORONARY STENT INTERVENTION N/A 05/12/2019   Procedure: CORONARY STENT INTERVENTION;  Surgeon: Leonie Man, MD;  Location: [place] [facility];  Service: Cardiovascular;;; Culprit-p-mLAD 95% 950% SP1) --> Scoring Balloon PTCA -> DES PCI (Resolute DES 3.5 x 38 - 3.8 mm; 0% LAD& 55% SP1). -->  SP1 was somewhat jailed with TIMI II flow post PCI.  [name] CORONARY STENT INTERVENTION N/A  07/02/2019   Procedure: CORONARY STENT INTERVENTION;  Surgeon: Leonie Man, MD;  Location: [place] [facility];  Service: Cardiovascular;  Laterality: N/A;  . GASTRIC BYPASS  11/2015   [facility]  . HAND SURGERY    . LEFT HEART CATH AND CORONARY ANGIOGRAPHY N/A 05/12/2019   Procedure: LEFT HEART CATH AND CORONARY ANGIOGRAPHY;  Surgeon: Leonie Man, MD;  Location: [place] [facility];  Service: Cardiovascular;;; Culprit-p-mLAD 95% 950% SP1) --> DES PCI.;  RPAV 60% -small caliber vessel, did not appear flow-limiting (medical management).  EF 55 to 60%.  Normal LVEDP.  [name]  LEFT HEART CATH AND CORONARY ANGIOGRAPHY N/A 07/02/2019   Procedure: LEFT HEART CATH AND CORONARY ANGIOGRAPHY;  Surgeon: Leonie Man, MD;  Location: [place] [facility];  Service: Cardiovascular;  Laterality: N/A;  . NM MYOVIEW LTD  06/24/2019   Carlton Adam): HIGH RISK.  EF 55%.  Large size, moderate severe defect in the anterior wall with associated anterior hypokinesis.  Suspect LAD territory.  . TUBAL LIGATION Bilateral      Current Outpatient Medications  Medication Sig Dispense Refill  . ARMOUR THYROID 60 MG tablet Take 60 mg by mouth daily.    [name] aspirin EC 81 MG tablet Take 1 tablet (81 mg total) by mouth daily. 90 tablet 3  . Cholecalciferol (VITAMIN D3) 125 MCG (5000 UT) TABS Take 5,000 Units by mouth 2 (two) times daily.     . clopidogrel (PLAVIX) 75 MG tablet Take 1 tablet (75 mg total) by mouth daily. 90 tablet 3  . Coenzyme Q10 (CO Q10) 200 MG CAPS Take 400 mg by mouth daily.     [name] DHEA 25 MG CAPS Take 25 mg by mouth daily.     [name] losartan-hydrochlorothiazide (HYZAAR) 100-12.5 MG tablet Take 0.5 tablets by mouth daily. 30 tablet 11  . Magnesium 250 MG TABS Take 250 mg by mouth daily.     . Multiple Vitamins-Minerals (MULTIVITAMIN WOMEN PO) Take 1 tablet by mouth daily.    . nebivolol (BYSTOLIC) 2.5 MG tablet Take 1 tablet (2.5 mg total) by mouth daily. 90 tablet 3  . nitroGLYCERIN (NITROSTAT) 0.4 MG SL tablet Place 1 tablet (0.4 mg total) under the tongue every 5 (five) minutes as needed. 25 tablet 2  . pantoprazole (PROTONIX) 40 MG tablet Take 40 mg by mouth as needed Jerrye Bushy).     . progesterone (PROMETRIUM) 200 MG capsule Take 200 mg by mouth at bedtime.    . vitamin B-12 (CYANOCOBALAMIN) 1000 MCG tablet Take 1,000 mcg by mouth daily.    . rosuvastatin (CRESTOR) 20 MG tablet Take 1 tablet (20 mg total) by mouth daily. 30 tablet 5   No current facility-administered medications for this visit.    Allergies:   Ceclor [cefaclor], Tetracyclines & related, Lipitor [atorvastatin  calcium], Penicillins, Amoxicillin, Ampicillin, Lisinopril, and Simvastatin    Social History:  The patient  reports that she quit smoking about 7 years ago. Her smoking use included cigarettes. She has a 3.00 pack-year smoking history. She has never used smokeless tobacco. She reports current alcohol use. She reports that she does not use drugs.   Family History:  The patient's family history includes Alzheimer's disease in her father and paternal grandmother; Cancer in her father and paternal grandfather; Diabetes Mellitus II in her sister; Hyperlipidemia in her sister; Hypertension in her mother and sister; Migraines in her father.    ROS: All other systems are reviewed and negative. Unless otherwise mentioned in H&P  PHYSICAL EXAM: VS:  BP 112/75   Pulse (!) 54   Ht 5\' 8"  (1.727 m)   Wt 201 lb 6.4 oz (91.4 kg)   SpO2 97%   BMI 30.62 kg/m  , BMI Body mass index is 30.62 kg/m. GEN: Well nourished, well developed, in no acute distress HEENT: normal Neck: no JVD, carotid bruits, or masses Cardiac: RRR; no murmurs, rubs, or gallops,no edema  Respiratory:  Clear to auscultation bilaterally, normal work of breathing GI: soft, nontender, nondistended, + BS MS: no deformity or atrophy Skin: warm and dry, no rash.  Multiple tattoos on arms and legs Neuro:  Strength and sensation are intact Psych: euthymic mood, full affect   EKG: Sinus bradycardia heart rate of 54 bpm.  Recent Labs: 07/01/2019: BUN 16; Creatinine, Ser 0.80; Hemoglobin 13.5; Platelets 187; Potassium 3.6; Sodium 141    Lipid Panel    Component Value Date/Time   CHOL 265 (H) 04/25/2012 1406   TRIG 379 (H) 04/25/2012 1406   HDL 48 04/25/2012 1406   CHOLHDL 5.5 04/25/2012 1406   VLDL 76 (H) 04/25/2012 1406   LDLCALC 141 (H) 04/25/2012 1406      Wt Readings from Last 3 Encounters:  07/16/19 201 lb 6.4 oz (91.4 kg)  07/06/19 201 lb 3.2 oz (91.3 kg)  07/02/19 200 lb (90.7 kg)      Other studies  Reviewed: Cath: 07/02/19   The left ventricular systolic function is normal. The left ventricular ejection fraction is 55-65% by visual estimate.  LV end diastolic pressure is normal.  -------------------------------  Previously placed Prox LAD to Mid LAD drug eluting stent is widely patent with 55% stenosed side branch in 1st Sept.  Prox LAD lesion is 70% stenosed -likely plaque/dissection shift from previously placed stent, suggests proximal edge dissection..  A drug-eluting stent was successfully placed covering the new lesion in the overlapping the prior stent, using a STENT RESOLUTE ONYX 3.5X8. -Postdilated to 3.8 mm.  Post intervention, there is a 0% residual stenosis.  Post intervention, the jailed septal perforator branch was pinched to 70% residual stenosis.  -----------------------------------------  Mid LAD lesion is 25% stenosed.  Ramus lesion is 30% stenosed.  RPAV lesion is 60% stenosed.  SUMMARY  Proximal stent edge dissection and proximal LAD -->successfully covered with additional RESOLUTE ONYX DES 3.5 mm x 8 mm overlapping proximal edge of her stent (postdilated to 3.8 mm)  Otherwise no significant change to coronary anatomy from last cath.  Normal EF and EDP.   RECOMMENDATIONS  Anticipate same-day discharge if stable.  Continue current medications, however she is supposedly not taking Metformin. We can discuss potential other options and follow-up. ? She was recently started on Bystolic, will continue ? Only able to tolerate moderate dose rosuvastatin, we are evaluating for possible PCSK9 inhibitor  [name], MD   Diagnostic Dominance: Right  Intervention   ASSESSMENT AND PLAN:  1.  Coronary artery disease: No further angina, but does continue to have some fatigue.  I have advised her to take Bystolic at nighttime to avoid daytime fatigue should this be contributing.  Of also asked her to slowly increase her exercise without  overdoing it which could cause worsening fatigue until her stamina has improved.  She is to continue on medical management with dual antiplatelet therapy of aspirin and Plavix, statin therapy, and blood pressure control.  2.  Hypertension: Excellent control of blood pressure today.  She will continue current medication regimen.    3.  Dyslipidemia: Goal of  LDL less than 70.  She is currently on rosuvastatin 20 mg daily.  We will recheck fasting lipids and LFTs in 3 months prior to follow-up appointment.  4.  Obesity: We will likely be able to undergo tummy tuck and liposuction to remove excess skin after gastric bypass in June 2021.  She is to continue to exercise daily, but try not to overdo to prevent injury and pain.  I have advised her to slowly increase her exercise from 20 minutes a day to 30 minutes a day and so 1 every week to increase her stamina.  Current medicines are reviewed at length with the patient today.    Labs/ tests ordered today include:  Phill Myron. West Pugh, ANP, [facility]   07/16/2019 9:37 AM    [facility] [address] Office [phone] Fax [phone]  Notice: This dictation was prepared with Dragon dictation along with smaller phrase technology. Any transcriptional errors that result from this process are unintentional and may not be corrected upon review.

## 2019-07-16 NOTE — Patient Instructions (Signed)
Medication Instructions:  Continue current medications  If you need a refill on your cardiac medications before your next appointment, please call your pharmacy.  Labwork: Fasting Lipid and Liver in 3 Months HERE IN OUR OFFICE AT [facility]   You will need to fast. DO NOT EAT OR DRINK PAST MIDNIGHT.     You will NOT need to fast   If you have labs (blood work) drawn today and your tests are completely normal, you will receive your results only by: [name] MyChart Message (if you have MyChart) OR . A paper copy in the mail If you have any lab test that is abnormal or we need to change your treatment, we will call you to review the results.  Testing/Procedures: None Ordered  PLEASE READ AND FOLLOW SALTY 6 ATTACHED  Reduce your risk of getting COVID-19 With your heart disease it is especially important for people at increased risk of severe illness from COVID-19, and those who live with them, to protect themselves from getting COVID-19. The best way to protect yourself and to help reduce the spread of the virus that causes COVID-19 is to: [name] Limit your interactions with other people as much as possible. . Take precautions to prevent getting COVID-19 when you do interact with others. If you start feeling sick and think you may have COVID-19, get in touch with your healthcare provider within 24 hours.  Follow-Up: IN 3 months Please call our office 2 months in advance,  to schedule this appointment. In Person [name], MD.    At [facility], you and your health needs are our priority.  As part of our continuing mission to provide you with exceptional heart care, we have created designated Provider Care Teams.  These Care Teams include your primary Cardiologist (physician) and Advanced Practice Providers (APPs -  Physician Assistants and Nurse Practitioners) who all work together to provide you with the care you need, when you need it.  Thank you for choosing [facility] at [facility]!!      Happy Holidays!!

## 2019-07-20 ENCOUNTER — Ambulatory Visit (HOSPITAL_COMMUNITY): Payer: Medicaid Other

## 2019-07-22 ENCOUNTER — Ambulatory Visit (HOSPITAL_COMMUNITY): Payer: Medicaid Other

## 2019-07-31 ENCOUNTER — Ambulatory Visit: Payer: Medicaid Other | Admitting: Orthopedic Surgery

## 2019-08-12 ENCOUNTER — Ambulatory Visit: Payer: Medicaid Other | Admitting: Orthopedic Surgery

## 2019-08-25 ENCOUNTER — Ambulatory Visit (INDEPENDENT_AMBULATORY_CARE_PROVIDER_SITE_OTHER): Payer: Managed Care, Other (non HMO) | Admitting: Orthopedic Surgery

## 2019-08-25 ENCOUNTER — Encounter: Payer: Self-pay | Admitting: Orthopedic Surgery

## 2019-08-25 ENCOUNTER — Other Ambulatory Visit: Payer: Self-pay

## 2019-08-25 DIAGNOSIS — M545 Low back pain, unspecified: Secondary | ICD-10-CM

## 2019-08-29 ENCOUNTER — Encounter: Payer: Self-pay | Admitting: Orthopedic Surgery

## 2019-08-29 NOTE — Progress Notes (Signed)
Office Visit Note   Patient: Carrie Reynolds           Date of Birth: 10-15-1955           MRN: [id number] Visit Date: 08/25/2019 Requested by: Leonard Downing, MD [place],  [place] [id number] PCP: Leonard Downing, MD  Subjective: Chief Complaint  Patient presents with  . Lower Back - Pain    HPI: Carrie Reynolds is a patient with low back pain.  The sciatic type pain goes back and forth between the right left-hand side.  Denies any numbness and tingling but she does state that the pain wakes her from sleep at night.  Does report buttock pain.  This is been worsening since November.  She tries extra Tylenol and ibuprofen without much relief.  She is currently retired.  She does have a history of epidural steroid injections with Dr. Ernestina Patches in the past.  She also has a history of gastric bypass.  She has tried stretching and home exercise program for the last 2 months all of which have not been helpful.              ROS: All systems reviewed are negative as they relate to the chief complaint within the history of present illness.  Patient denies  fevers or chills.   Assessment & Plan: Visit Diagnoses:  1. Low back pain, unspecified back pain laterality, unspecified chronicity, unspecified whether sciatica present     Plan: Impression is low back pain with alternating sciatica between right and left hand side.  She is done well with epidural steroid injections within the last 4 years.  Does not look like an operative problem.  No weakness.  With favorable response in the past I recommend repeat evaluation with Dr. Ernestina Patches for ESI's.  Follow-up with me as needed.  No red flag signs today.  Follow-Up Instructions: No follow-ups on file.   Orders:  No orders of the defined types were placed in this encounter.  No orders of the defined types were placed in this encounter.     Procedures: No procedures performed   Clinical Data: No additional  findings.  Objective: Vital Signs: There were no vitals taken for this visit.  Physical Exam:   Constitutional: Patient appears well-developed HEENT:  Head: Normocephalic Eyes:EOM are normal Neck: Normal range of motion Cardiovascular: Normal rate Pulmonary/chest: Effort normal Neurologic: Patient is alert Skin: Skin is warm Psychiatric: Patient has normal mood and affect    Ortho Exam: Ortho exam demonstrates normal gait alignment.  No muscle atrophy in the legs.  Reflexes symmetric 1+ out of 4 bilateral patella and Achilles.  No definite paresthesias L1 S1 bilaterally.  Patient has 5 out of 5 ankle dorsiflexion plantarflexion quad hamstring strength with mild tenderness with forward lateral bending but no trochanteric tenderness is noted.  Skin is intact in the back region.  No rashes.  Specialty Comments:  No specialty comments available.  Imaging: No results found.   PMFS History: Patient Active Problem List   Diagnosis Date Noted  . Abnormal nuclear cardiac imaging test 07/02/2019  . S/P drug eluting coronary stent placement   . Coronary artery disease involving native coronary artery of native heart with angina pectoris ([place]) 06/17/2019  . Abnormal finding on EKG 06/16/2019  . DOE (dyspnea on exertion) 06/16/2019  . CAD S/P DES PCI-proximal LAD 05/20/2019  . Abnormal cardiac CT angiography 05/12/2019  . Preop cardiovascular exam   . Agatston coronary  artery calcium score greater than 400 04/16/2019  . Dyslipidemia, goal LDL below 70 (closer to 55) 04/16/2019  . Rapid palpitations 04/16/2017  . Systolic murmur [id number]  . Near syncope 04/16/2017  . OSA on CPAP 11/19/2013  . Obesity hypoventilation syndrome ([place]) 11/19/2013  . Sleep apnea 04/25/2012  . Obesity (BMI 30.0-34.9) 04/25/2012  . Depression 04/25/2012  . Environmental allergies 04/25/2012  . Essential hypertension 04/25/2012  . Postmenopausal 09/18/2011  . LEG EDEMA 03/16/2010   Past Medical  History:  Diagnosis Date  . Anxiety   . Arnold-Chiari malformation ([place]) 2006  . CAD S/P PCI 05/06/2019   05/06/2019: prox LAD (@ SPI) PCI with Resolute Onyx DES 3.5  x 8, Residual 60% PDA, Normal LVF; 07/02/2019: relook Cath for Anterior Ischemia on Mhoview --> CATH 70% pre-stent/proximal edge dissection --> proximal overlapping DES (Resolute Onyx 3.5 x 8 - new & old stent post-dilated to 3.8 mm)  . Cervical strain    syndrome  . Cervicogenic headache   . Concussion with loss of consciousness 10/09/2017  . CTS (carpal tunnel syndrome)   . Depression   . GERD (gastroesophageal reflux disease)   . Glucose intolerance (impaired glucose tolerance)   . High coronary artery calcium score    Score of 1460.  Coronary CTA shows high-grade proximal-mid LAD stenosis (CT FFR 0.50).  Also PDA/PLA 60 to 70% stenosis (CTO FFR 0.75)  . History of kidney stones   . Hypertension   . Migraine headache    with visual aura  . Obesity   . OSA (obstructive sleep apnea)    not treated  . Postmenopausal   . Vertigo    post concussive    Family History  Problem Relation Age of Onset  . Hypertension Mother   . Cancer Father        lung  . Alzheimer's disease Father   . Migraines Father   . Diabetes Mellitus II Sister   . Hyperlipidemia Sister   . Hypertension Sister   . Alzheimer's disease Paternal Grandmother   . Cancer Paternal Grandfather        lung    Past Surgical History:  Procedure Laterality Date  . ABDOMINAL HYSTERECTOMY  2003   partial- L ovary remains  . ABDOMINAL HYSTERECTOMY    . BUNIONECTOMY     right great toe  . CHOLECYSTECTOMY N/A 06/05/2018   Procedure: LAPAROSCOPIC CHOLECYSTECTOMY WITH POSSIBLE INTRAOPERATIVE CHOLANGIOGRAM;  Surgeon: Erroll Luna, MD;  Location: [place];  Service: General;  Laterality: N/A;  . CORONARY STENT INTERVENTION N/A 05/12/2019   Procedure: CORONARY STENT INTERVENTION;  Surgeon: Leonie Man, MD;  Location: [place] [facility];  Service:  Cardiovascular;;; Culprit-p-mLAD 95% 950% SP1) --> Scoring Balloon PTCA -> DES PCI (Resolute DES 3.5 x 38 - 3.8 mm; 0% LAD& 55% SP1). -->  SP1 was somewhat jailed with TIMI II flow post PCI.  [name] CORONARY STENT INTERVENTION N/A 07/02/2019   Procedure: CORONARY STENT INTERVENTION;  Surgeon: Leonie Man, MD;  Location: [place] [facility];  Service: Cardiovascular;  Laterality: N/A;  . GASTRIC BYPASS  11/2015   [facility]  . HAND SURGERY    . LEFT HEART CATH AND CORONARY ANGIOGRAPHY N/A 05/12/2019   Procedure: LEFT HEART CATH AND CORONARY ANGIOGRAPHY;  Surgeon: Leonie Man, MD;  Location: [place] [facility];  Service: Cardiovascular;;; Culprit-p-mLAD 95% 950% SP1) --> DES PCI.;  RPAV 60% -small caliber vessel, did not appear flow-limiting (medical management).  EF 55 to 60%.  Normal  LVEDP.  [name] LEFT HEART CATH AND CORONARY ANGIOGRAPHY N/A 07/02/2019   Procedure: LEFT HEART CATH AND CORONARY ANGIOGRAPHY;  Surgeon: Leonie Man, MD;  Location: [place] [facility];  Service: Cardiovascular;  Laterality: N/A;  . NM MYOVIEW LTD  06/24/2019   Carlton Adam): HIGH RISK.  EF 55%.  Large size, moderate severe defect in the anterior wall with associated anterior hypokinesis.  Suspect LAD territory.  . TUBAL LIGATION Bilateral    Social History   Occupational History  . Occupation: Retired Best boy: [employer]    Comment: Logistics, warehouse  Tobacco Use  . Smoking status: Former Smoker    Packs/day: 0.10    Years: 30.00    Pack years: 3.00    Types: Cigarettes    Quit date: 04/26/2012    Years since quitting: 7.3  . Smokeless tobacco: Never Used  Substance and Sexual Activity  . Alcohol use: Yes    Comment: occasional  . Drug use: No  . Sexual activity: Yes    Partners: Male    Birth control/protection: Surgical

## 2019-08-31 NOTE — Addendum Note (Signed)
Addended byLaurann Montana on: 08/31/2019 09:17 AM   Modules accepted: Orders

## 2019-09-03 ENCOUNTER — Encounter: Payer: Self-pay | Admitting: Orthopedic Surgery

## 2019-09-15 ENCOUNTER — Ambulatory Visit: Payer: Self-pay

## 2019-09-15 ENCOUNTER — Other Ambulatory Visit: Payer: Self-pay

## 2019-09-15 ENCOUNTER — Ambulatory Visit (INDEPENDENT_AMBULATORY_CARE_PROVIDER_SITE_OTHER): Payer: Managed Care, Other (non HMO) | Admitting: Physical Medicine and Rehabilitation

## 2019-09-15 VITALS — BP 114/69 | HR 65

## 2019-09-15 DIAGNOSIS — M47816 Spondylosis without myelopathy or radiculopathy, lumbar region: Secondary | ICD-10-CM | POA: Diagnosis not present

## 2019-09-15 MED ORDER — METHYLPREDNISOLONE ACETATE 80 MG/ML IJ SUSP
40.0000 mg | Freq: Once | INTRAMUSCULAR | Status: AC
  Administered 2019-09-15: 40 mg

## 2019-09-15 NOTE — Progress Notes (Signed)
Numeric Pain Rating Scale and Functional Assessment Average Pain 4    In the last MONTH (on 0-10 scale) has pain interfered with the following?  1. General activity like being  able to carry out your everyday physical activities such as walking, climbing stairs, carrying groceries, or moving a chair? yes Rating(6)   +Driver, -BT, -Dye Allergies. Does take Plavix

## 2019-09-16 NOTE — Procedures (Signed)
Lumbar Epidural Steroid Injection - Interlaminar Approach with Fluoroscopic Guidance  Patient: Carrie Reynolds      Date of Birth: [birth year] MRN: [id number] PCP: Leonard Downing, MD      Visit Date: 09/15/2019   Universal Protocol:     Consent Given By: the patient  Position: PRONE  Additional Comments: Vital signs were monitored before and after the procedure. Patient was prepped and draped in the usual sterile fashion. The correct patient, procedure, and site was verified.   Injection Procedure Details:  Procedure Site One Meds Administered:  Meds ordered this encounter  Medications  . methylPREDNISolone acetate (DEPO-MEDROL) injection 40 mg     Laterality: Right  Location/Site:  L4-L5  Needle size: 20 G  Needle type: Tuohy  Needle Placement: Paramedian epidural  Findings:   -Comments: Excellent flow of contrast into the epidural space.  Procedure Details: Using a paramedian approach from the side mentioned above, the region overlying the inferior lamina was localized under fluoroscopic visualization and the soft tissues overlying this structure were infiltrated with 4 ml. of 1% Lidocaine without Epinephrine. The Tuohy needle was inserted into the epidural space using a paramedian approach.   The epidural space was localized using loss of resistance along with lateral and bi-planar fluoroscopic views.  After negative aspirate for air, blood, and CSF, a 2 ml. volume of Isovue-250 was injected into the epidural space and the flow of contrast was observed. Radiographs were obtained for documentation purposes.    The injectate was administered into the level noted above.   Additional Comments:  The patient tolerated the procedure well Dressing: 2 x 2 sterile gauze and Band-Aid    Post-procedure details: Patient was observed during the procedure. Post-procedure instructions were reviewed.  Patient left the clinic in stable condition.

## 2019-09-16 NOTE — Progress Notes (Signed)
Carrie Reynolds - 64 y.o. female MRN [id number]  Date of birth: [birth year]  Office Visit Note: Visit Date: 09/15/2019 PCP: Leonard Downing, MD Referred by: Leonard Downing, *  Subjective: Chief Complaint  Patient presents with  . Lower Back - Pain   HPI: Carrie Reynolds is a 64 y.o. female who comes in today At the request of Dr. Anderson Malta for lumbar interventional spine procedure and likely epidural injection.  She is having consistent intermittent right radicular pain in an L5 distribution.  She has had a lot of worsening back pain has not been resolved with conservative care including medication management and exercise.  She occasionally gets left-sided complaints.  MRI from 2019 shows mainly facet arthropathy with some foraminal narrowing and lateral recess narrowing but no high-grade central stenosis.  We actually have seen the patient in the remote past.  The last time I saw the patient was in 2014.  She actually had a different last name at that point and since I have seen her and has lost a tremendous amount of weight.  She has had gastric bypass.  She does report axial low back pain as well and I think this is likely due to the facet arthritis.  She continues to see Dr. Anderson Malta for orthopedic complaints.  The last injection that helped quite a bit was interlaminar epidural steroid injection and we did elect to do this today.  This was completed as shown in the notes below in the procedure report.  However we had initially had the patient coming in for transforaminal approach and probably at that time realize she was on Plavix.  Today with some confusion on my part as this patient had been seen in the pas and I decided to complete interlaminar approach because it had worked before I did not realize again that she was on Plavix right before the procedure.  The checks in place that we have did have the medical assistant document on the chart but the bolded blood  thinner category still said negative but that it was written in a smaller print that she was on Plavix below that.  This really should not be an issue as the injection went on without difficulty with no increased pain there was no intravascular flow and no blood was seen on drawback of the syringe.  We will monitor her just to make sure there is no evidence of hematoma.  The data on this would suggest that the chances of that are still very very very small.  ROS Otherwise per HPI.  Assessment & Plan: Visit Diagnoses:  1. Spondylosis without myelopathy or radiculopathy, lumbar region     Plan: No additional findings.   Meds & Orders:  Meds ordered this encounter  Medications  . methylPREDNISolone acetate (DEPO-MEDROL) injection 40 mg    Orders Placed This Encounter  Procedures  . XR C-ARM NO REPORT  . Epidural Steroid injection    Follow-up: No follow-ups on file.   Procedures: No procedures performed  Lumbar Epidural Steroid Injection - Interlaminar Approach with Fluoroscopic Guidance  Patient: Carrie Reynolds      Date of Birth: [birth year] MRN: [id number] PCP: Leonard Downing, MD      Visit Date: 09/15/2019   Universal Protocol:     Consent Given By: the patient  Position: PRONE  Additional Comments: Vital signs were monitored before and after the procedure. Patient was prepped and draped in the usual sterile fashion. The  correct patient, procedure, and site was verified.   Injection Procedure Details:  Procedure Site One Meds Administered:  Meds ordered this encounter  Medications  . methylPREDNISolone acetate (DEPO-MEDROL) injection 40 mg     Laterality: Right  Location/Site:  L4-L5  Needle size: 20 G  Needle type: Tuohy  Needle Placement: Paramedian epidural  Findings:   -Comments: Excellent flow of contrast into the epidural space.  Procedure Details: Using a paramedian approach from the side mentioned above, the region overlying the  inferior lamina was localized under fluoroscopic visualization and the soft tissues overlying this structure were infiltrated with 4 ml. of 1% Lidocaine without Epinephrine. The Tuohy needle was inserted into the epidural space using a paramedian approach.   The epidural space was localized using loss of resistance along with lateral and bi-planar fluoroscopic views.  After negative aspirate for air, blood, and CSF, a 2 ml. volume of Isovue-250 was injected into the epidural space and the flow of contrast was observed. Radiographs were obtained for documentation purposes.    The injectate was administered into the level noted above.   Additional Comments:  The patient tolerated the procedure well Dressing: 2 x 2 sterile gauze and Band-Aid    Post-procedure details: Patient was observed during the procedure. Post-procedure instructions were reviewed.  Patient left the clinic in stable condition.    Clinical History: MRI LUMBAR SPINE FINDINGS  Segmentation: Assumed standard. The last well-formed disc space is designated L5-S1 for the purposes of this report.  Alignment:  Physiologic.  Vertebrae: No fracture, evidence of discitis, or bone lesion. Small hemangioma in the L1 vertebral body.  Conus medullaris and cauda equina: Conus extends to the L1 level. Conus and cauda equina appear normal.  Paraspinal and other soft tissues: Negative.  Disc levels:  T12-L1:  Negative.  L1-L2:  Negative.  L2-L3:  Negative.  L3-L4: Small disc bulge, asymmetric to the left, with annular fissure. Moderate right greater than left facet arthropathy with small right facet joint effusion. No stenosis.  L4-L5: Small disc bulge and annular fissure. Moderate bilateral facet arthropathy with ligamentum flavum hypertrophy. Small right synovial cyst projecting into the paraspinous soft tissues. Mild bilateral neuroforaminal stenosis. No spinal canal stenosis.  L5-S1:  Mild to moderate  bilateral facet arthropathy.  No stenosis.  IMPRESSION: 1. No acute traumatic injury within the cervical, thoracic, or lumbar spine. 2. Mild degenerative disc disease at C4-C5 and C5-C6. 3. Mild degenerative disc disease and moderate bilateral facet arthropathy at L3-L4 and L4-L5. Mild bilateral neuroforaminal stenosis at L4-L5. 4. 2.3 cm indeterminate right adrenal gland nodule. Recommend nonemergent adrenal protocol CT with and without contrast for further evaluation. This recommendation follows ACR consensus guidelines: Management of Incidental Adrenal Masses: A White Paper of the ACR Incidental Findings Committee. J Am Coll Radiol 2017;14:1038-1044.   Electronically Signed   By: Titus Dubin M.D.   On: 10/17/2017 16:56   She reports that she quit smoking about 7 years ago. Her smoking use included cigarettes. She has a 3.00 pack-year smoking history. She has never used smokeless tobacco. No results for input(s): HGBA1C, LABURIC in the last 8760 hours.  Objective:  VS:  HT:    WT:   BMI:     BP:114/69  HR:65bpm  TEMP: ( )  RESP:  Physical Exam  Ortho Exam Imaging: XR C-ARM NO REPORT  Result Date: 09/15/2019 Please see Notes tab for imaging impression.   Past Medical/Family/Surgical/Social History: Medications & Allergies reviewed per EMR, new medications updated. Patient  Active Problem List   Diagnosis Date Noted  . Abnormal nuclear cardiac imaging test 07/02/2019  . S/P drug eluting coronary stent placement   . Coronary artery disease involving native coronary artery of native heart with angina pectoris ([place]) 06/17/2019  . Abnormal finding on EKG 06/16/2019  . DOE (dyspnea on exertion) 06/16/2019  . CAD S/P DES PCI-proximal LAD 05/20/2019  . Abnormal cardiac CT angiography 05/12/2019  . Preop cardiovascular exam   . Agatston coronary artery calcium score greater than 400 04/16/2019  . Dyslipidemia, goal LDL below 70 (closer to 55) 04/16/2019  . Rapid  palpitations 04/16/2017  . Systolic murmur [id number]  . Near syncope 04/16/2017  . OSA on CPAP 11/19/2013  . Obesity hypoventilation syndrome ([place]) 11/19/2013  . Sleep apnea 04/25/2012  . Obesity (BMI 30.0-34.9) 04/25/2012  . Depression 04/25/2012  . Environmental allergies 04/25/2012  . Essential hypertension 04/25/2012  . Postmenopausal 09/18/2011  . LEG EDEMA 03/16/2010   Past Medical History:  Diagnosis Date  . Anxiety   . Arnold-Chiari malformation ([place]) 2006  . CAD S/P PCI 05/06/2019   05/06/2019: prox LAD (@ SPI) PCI with Resolute Onyx DES 3.5  x 8, Residual 60% PDA, Normal LVF; 07/02/2019: relook Cath for Anterior Ischemia on Mhoview --> CATH 70% pre-stent/proximal edge dissection --> proximal overlapping DES (Resolute Onyx 3.5 x 8 - new & old stent post-dilated to 3.8 mm)  . Cervical strain    syndrome  . Cervicogenic headache   . Concussion with loss of consciousness 10/09/2017  . CTS (carpal tunnel syndrome)   . Depression   . GERD (gastroesophageal reflux disease)   . Glucose intolerance (impaired glucose tolerance)   . High coronary artery calcium score    Score of 1460.  Coronary CTA shows high-grade proximal-mid LAD stenosis (CT FFR 0.50).  Also PDA/PLA 60 to 70% stenosis (CTO FFR 0.75)  . History of kidney stones   . Hypertension   . Migraine headache    with visual aura  . Obesity   . OSA (obstructive sleep apnea)    not treated  . Postmenopausal   . Vertigo    post concussive   Family History  Problem Relation Age of Onset  . Hypertension Mother   . Cancer Father        lung  . Alzheimer's disease Father   . Migraines Father   . Diabetes Mellitus II Sister   . Hyperlipidemia Sister   . Hypertension Sister   . Alzheimer's disease Paternal Grandmother   . Cancer Paternal Grandfather        lung   Past Surgical History:  Procedure Laterality Date  . ABDOMINAL HYSTERECTOMY  2003   partial- L ovary remains  . ABDOMINAL HYSTERECTOMY    .  BUNIONECTOMY     right great toe  . CHOLECYSTECTOMY N/A 06/05/2018   Procedure: LAPAROSCOPIC CHOLECYSTECTOMY WITH POSSIBLE INTRAOPERATIVE CHOLANGIOGRAM;  Surgeon: Erroll Luna, MD;  Location: [facility];  Service: General;  Laterality: N/A;  . CORONARY STENT INTERVENTION N/A 05/12/2019   Procedure: CORONARY STENT INTERVENTION;  Surgeon: Leonie Man, MD;  Location: [facility];  Service: Cardiovascular;;; Culprit-p-mLAD 95% 950% SP1) --> Scoring Balloon PTCA -> DES PCI (Resolute DES 3.5 x 38 - 3.8 mm; 0% LAD& 55% SP1). -->  SP1 was somewhat jailed with TIMI II flow post PCI.  [name] CORONARY STENT INTERVENTION N/A 07/02/2019   Procedure: CORONARY STENT INTERVENTION;  Surgeon: Leonie Man, MD;  Location: [facility];  Service:  Cardiovascular;  Laterality: N/A;  . GASTRIC BYPASS  11/2015   [facility]  . HAND SURGERY    . LEFT HEART CATH AND CORONARY ANGIOGRAPHY N/A 05/12/2019   Procedure: LEFT HEART CATH AND CORONARY ANGIOGRAPHY;  Surgeon: Leonie Man, MD;  Location: [facility] [facility];  Service: Cardiovascular;;; Culprit-p-mLAD 95% 950% SP1) --> DES PCI.;  RPAV 60% -small caliber vessel, did not appear flow-limiting (medical management).  EF 55 to 60%.  Normal LVEDP.  [name] LEFT HEART CATH AND CORONARY ANGIOGRAPHY N/A 07/02/2019   Procedure: LEFT HEART CATH AND CORONARY ANGIOGRAPHY;  Surgeon: Leonie Man, MD;  Location: [facility];  Service: Cardiovascular;  Laterality: N/A;  . NM MYOVIEW LTD  06/24/2019   Carlton Adam): HIGH RISK.  EF 55%.  Large size, moderate severe defect in the anterior wall with associated anterior hypokinesis.  Suspect LAD territory.  . TUBAL LIGATION Bilateral    Social History   Occupational History  . Occupation: Retired Best boy: [employer]    Comment: Logistics, warehouse  Tobacco Use  . Smoking status: Former Smoker    Packs/day: 0.10    Years: 30.00    Pack years: 3.00    Types: Cigarettes    Quit date: 04/26/2012     Years since quitting: 7.3  . Smokeless tobacco: Never Used  Substance and Sexual Activity  . Alcohol use: Yes    Comment: occasional  . Drug use: No  . Sexual activity: Yes    Partners: Male    Birth control/protection: Surgical

## 2019-09-21 ENCOUNTER — Other Ambulatory Visit: Payer: Self-pay

## 2019-09-21 MED ORDER — ROSUVASTATIN CALCIUM 20 MG PO TABS: 20.0000 mg | ORAL_TABLET | Freq: Every day | ORAL | 3 refills | Status: DC

## 2019-09-22 ENCOUNTER — Other Ambulatory Visit: Payer: Self-pay

## 2019-10-01 ENCOUNTER — Other Ambulatory Visit: Payer: Self-pay | Admitting: Gastroenterology

## 2019-10-07 ENCOUNTER — Other Ambulatory Visit: Payer: Self-pay

## 2019-10-08 ENCOUNTER — Encounter: Payer: Self-pay | Admitting: Obstetrics & Gynecology

## 2019-10-08 ENCOUNTER — Ambulatory Visit (INDEPENDENT_AMBULATORY_CARE_PROVIDER_SITE_OTHER): Payer: Managed Care, Other (non HMO) | Admitting: Obstetrics & Gynecology

## 2019-10-08 ENCOUNTER — Other Ambulatory Visit (HOSPITAL_COMMUNITY)
Admission: RE | Admit: 2019-10-08 | Discharge: 2019-10-08 | Disposition: A | Discharge status: Home / Self Care | Payer: Managed Care, Other (non HMO) | Source: Ambulatory Visit | Attending: Obstetrics & Gynecology | Admitting: Obstetrics & Gynecology

## 2019-10-08 VITALS — BP 112/60 | HR 64 | Temp 97.3°F | Resp 10 | Ht 66.75 in | Wt 202.4 lb

## 2019-10-08 DIAGNOSIS — Z1151 Encounter for screening for human papillomavirus (HPV): Secondary | ICD-10-CM | POA: Insufficient documentation

## 2019-10-08 DIAGNOSIS — N898 Other specified noninflammatory disorders of vagina: Secondary | ICD-10-CM | POA: Diagnosis not present

## 2019-10-08 DIAGNOSIS — Z124 Encounter for screening for malignant neoplasm of cervix: Secondary | ICD-10-CM | POA: Insufficient documentation

## 2019-10-08 NOTE — Progress Notes (Signed)
64 y.o. G8P2002 Married White or Caucasian female here as a new patient for vaginal bleeding. Patient was referred from [facility].  She's been followed for recurrent UTIs.  She was referred there by Dr. Arelia Sneddon.  She has been experiencing blood in her urine.  Also, she has a vaginal/vulvar lesion that has been noted.  She was sent her for additional evaluation of this.  She has noted some bleeding with wiping.  She's done her own rectal exam and felt something that feels like "plastic and is hard".    She has hx of hysterectomy due to irregular bleeding.  Pathology for hysterectomy 06/2004.  She cannot recall the physician who did the hysterectomy.  She has not had any gyn follow up since than.    Is receiving HRT pellets--was getting these in [place], [place], initially.  Dr. Lenon Curt is administering these.    Patient's last menstrual period was 07/24/2003 (within months).          Sexually active: Yes.    The current method of family planning is status post hysterectomy.    Exercising: No.  The patient does not participate in regular exercise at present. Smoker: Former, quit 2013  Health Maintenance: Pap:  Years ago  History of abnormal Pap:  Yes, cryosurgery,normal since per patient MMG:  May 2020 Colonoscopy:  2011 per patient -- patient scheduled 10/27/19 BMD:   Years ago per patient normal TDaP:  November 2020 Pneumonia vaccine(s):  n/a Shingrix:   2019 Hep C testing: 2018 Neg Screening Labs: PCP   reports that she quit smoking about 7 years ago. Her smoking use included cigarettes. She has a 3.00 pack-year smoking history. She has never used smokeless tobacco. She reports current alcohol use. She reports that she does not use drugs.  Past Medical History:  Diagnosis Date  . Anxiety   . Arnold-Chiari malformation ([place]) 2006  . CAD S/P PCI 05/06/2019   05/06/2019: prox LAD (@ SPI) PCI with Resolute Onyx DES 3.5  x 8, Residual 60% PDA, Normal LVF; 07/02/2019: relook Cath for  Anterior Ischemia on Mhoview --> CATH 70% pre-stent/proximal edge dissection --> proximal overlapping DES (Resolute Onyx 3.5 x 8 - new & old stent post-dilated to 3.8 mm)  . Cervical strain    syndrome  . Cervicogenic headache   . Concussion with loss of consciousness 10/09/2017  . CTS (carpal tunnel syndrome)   . Depression   . GERD (gastroesophageal reflux disease)   . Glucose intolerance (impaired glucose tolerance)   . High coronary artery calcium score    Score of 1460.  Coronary CTA shows high-grade proximal-mid LAD stenosis (CT FFR 0.50).  Also PDA/PLA 60 to 70% stenosis (CTO FFR 0.75)  . History of kidney stones   . Hypertension   . Migraine headache    with visual aura  . Obesity   . OSA (obstructive sleep apnea)    not treated  . Postmenopausal   . Vertigo    post concussive    Past Surgical History:  Procedure Laterality Date  . ABDOMINAL HYSTERECTOMY  2003   partial- L ovary remains  . ABDOMINAL HYSTERECTOMY    . BUNIONECTOMY     right great toe  . CHOLECYSTECTOMY N/A 06/05/2018   Procedure: LAPAROSCOPIC CHOLECYSTECTOMY WITH POSSIBLE INTRAOPERATIVE CHOLANGIOGRAM;  Surgeon: Erroll Luna, MD;  Location: [place];  Service: General;  Laterality: N/A;  . CORONARY STENT INTERVENTION N/A 05/12/2019   Procedure: CORONARY STENT INTERVENTION;  Surgeon: Leonie Man, MD;  Location:  [place] [facility];  Service: Cardiovascular;;; Culprit-p-mLAD 95% 950% SP1) --> Scoring Balloon PTCA -> DES PCI (Resolute DES 3.5 x 38 - 3.8 mm; 0% LAD& 55% SP1). -->  SP1 was somewhat jailed with TIMI II flow post PCI.  [name] CORONARY STENT INTERVENTION N/A 07/02/2019   Procedure: CORONARY STENT INTERVENTION;  Surgeon: Leonie Man, MD;  Location: [place] [facility];  Service: Cardiovascular;  Laterality: N/A;  . GASTRIC BYPASS  11/2015   [facility]  . GYNECOLOGIC CRYOSURGERY    . HAND SURGERY    . LEFT HEART CATH AND CORONARY ANGIOGRAPHY N/A 05/12/2019   Procedure: LEFT HEART CATH AND CORONARY  ANGIOGRAPHY;  Surgeon: Leonie Man, MD;  Location: [place] [facility];  Service: Cardiovascular;;; Culprit-p-mLAD 95% 950% SP1) --> DES PCI.;  RPAV 60% -small caliber vessel, did not appear flow-limiting (medical management).  EF 55 to 60%.  Normal LVEDP.  [name] LEFT HEART CATH AND CORONARY ANGIOGRAPHY N/A 07/02/2019   Procedure: LEFT HEART CATH AND CORONARY ANGIOGRAPHY;  Surgeon: Leonie Man, MD;  Location: [facility];  Service: Cardiovascular;  Laterality: N/A;  . NM MYOVIEW LTD  06/24/2019   Carlton Adam): HIGH RISK.  EF 55%.  Large size, moderate severe defect in the anterior wall with associated anterior hypokinesis.  Suspect LAD territory.  . TUBAL LIGATION Bilateral     Current Outpatient Medications  Medication Sig Dispense Refill  . ARMOUR THYROID 60 MG tablet Take 60 mg by mouth daily.    [name] aspirin EC 81 MG tablet Take 1 tablet (81 mg total) by mouth daily. 90 tablet 3  . Cholecalciferol (VITAMIN D3) 125 MCG (5000 UT) TABS Take 5,000 Units by mouth 2 (two) times daily.     . clopidogrel (PLAVIX) 75 MG tablet Take 1 tablet (75 mg total) by mouth daily. 90 tablet 3  . Coenzyme Q10 (CO Q10) 200 MG CAPS Take 400 mg by mouth daily.     [name] DHEA 25 MG CAPS Take 25 mg by mouth daily.     [name] losartan-hydrochlorothiazide (HYZAAR) 100-12.5 MG tablet Take 0.5 tablets by mouth daily. 30 tablet 11  . Magnesium 250 MG TABS Take 250 mg by mouth daily.     . Multiple Vitamins-Minerals (MULTIVITAMIN WOMEN PO) Take 1 tablet by mouth daily.    . nebivolol (BYSTOLIC) 2.5 MG tablet Take 1 tablet (2.5 mg total) by mouth daily. 90 tablet 3  . nitroGLYCERIN (NITROSTAT) 0.4 MG SL tablet Place 1 tablet (0.4 mg total) under the tongue every 5 (five) minutes as needed. 25 tablet 2  . pantoprazole (PROTONIX) 40 MG tablet Take 40 mg by mouth as needed Jerrye Bushy).     . rosuvastatin (CRESTOR) 20 MG tablet Take 1 tablet (20 mg total) by mouth daily. 90 tablet 3  . Testosterone 100 MG PLLT by Implant route.     . vitamin B-12 (CYANOCOBALAMIN) 1000 MCG tablet Take 1,000 mcg by mouth daily.    . hydrocortisone (ANUSOL-HC) 2.5 % rectal cream USE RECTALLY UP TO 3 TO 4 TIMES DAILY AS NEEDED    . ondansetron (ZOFRAN) 8 MG tablet as needed.    . progesterone (PROMETRIUM) 200 MG capsule Take 200 mg by mouth at bedtime.     No current facility-administered medications for this visit.    Family History  Problem Relation Age of Onset  . Hypertension Mother   . Cancer Father        lung  . Alzheimer's disease Father   .  Migraines Father   . Hyperlipidemia Sister   . Hypertension Sister   . Alzheimer's disease Paternal Grandmother   . Cancer Paternal Grandfather        lung    Review of Systems  Gastrointestinal:       Lower abdominal pain  Genitourinary: Positive for vaginal bleeding.  All other systems reviewed and are negative.   Exam:   BP 112/60 (BP Location: Left Arm, Patient Position: Sitting, Cuff Size: Normal)   Pulse 64   Temp (!) 97.3 F (36.3 C) (Temporal)   Resp 10   Ht 5' 6.75" (1.695 m)   Wt 202 lb 6.4 oz (91.8 kg)   LMP 07/24/2003 (Within Months)   BMI 31.94 kg/m    Height: 5' 6.75" (169.5 cm)  Ht Readings from Last 3 Encounters:  10/08/19 5' 6.75" (1.695 m)  07/16/19 5\' 8"  (1.727 m)  07/06/19 5\' 8"  (1.727 m)    General appearance: alert, cooperative and appears stated age Head: Normocephalic, without obvious abnormality, atraumatic Breasts: normal appearance, no masses or tenderness Abdomen: soft, non-tender; bowel sounds normal; no masses,  no organomegaly Extremities: extremities normal, atraumatic, no cyanosis or edema Skin: Skin color, texture, turgor normal. No rashes or lesions Lymph nodes: Cervical, supraclavicular, and axillary nodes normal. No abnormal inguinal nodes palpated Neurologic: Grossly normal  Pelvic: External genitalia:  no lesions              Urethra:  normal appearing urethra with no masses, tenderness or lesions              Bartholins  and Skenes: normal                 Vagina: normal appearing vagina with normal color and discharge, small ulcerated lesion at apex of vagina just off to the pt's left              Cervix: absent              Pap taken: Yes.   Bimanual Exam:  Uterus:  uterus absent              Adnexa: no mass, fullness, tenderness               Rectovaginal: Confirms               Anus:  normal sphincter tone, no lesions, there is nothing that feels firm or "like plastic" on rectal exam  Vaginal biopsy recommended.  Consent obtained.  Apex of the vagina was cleansed was with Betadine x3.  Using a biopsy forcep, the lesion was almost fully excised with biopsy.  Monsel's was used for excellent hemostasis.  Patient tolerated the procedure well.  Chaperone, Terence Lux, CMA, was present for exam.  A:  History of microscopic hematuria with full evaluation of Dr. Wendy Poet Small vaginal lesion that is possibly causing bleeding, biopsy today History of vaginal hysterectomy with LSO and right salpingectomy 2003 History of coronary bypass 2017 and stent placement 2020, on Plavix On HRT with pellet placement Hypertension  P:   Pap smear and vaginal biopsy obtained today.  Results will be called to patient.  Post procedure instructions/recommendations reviewed. I had a frank discussion with her about her HRT.  She is aware I would likely not be writing it if I was giving her care because of her cardiovascular history.  I really think she needs to review this with her cardiologist who may feel different because she has maintained being  on HRT through her cardiovascular procedures over the last several years.  I think being on progesterone alone and possibly progesterone or testosterone would be okay.  However there is peripheral conversion of estrogen from the testosterone so this may not be acceptable either.  Again, I think she should review this with her cardiologist to ensure that he is fully aware she is on  estradiol and is okay with this.

## 2019-10-09 ENCOUNTER — Other Ambulatory Visit: Payer: Self-pay

## 2019-10-12 ENCOUNTER — Encounter: Payer: Self-pay | Admitting: Cardiology

## 2019-10-12 ENCOUNTER — Telehealth: Payer: Self-pay | Admitting: Obstetrics & Gynecology

## 2019-10-12 ENCOUNTER — Ambulatory Visit (INDEPENDENT_AMBULATORY_CARE_PROVIDER_SITE_OTHER): Payer: Managed Care, Other (non HMO) | Admitting: Cardiology

## 2019-10-12 ENCOUNTER — Other Ambulatory Visit: Payer: Self-pay

## 2019-10-12 VITALS — BP 134/78 | HR 53 | Temp 96.4°F | Ht 68.0 in | Wt 203.0 lb

## 2019-10-12 DIAGNOSIS — I2510 Atherosclerotic heart disease of native coronary artery without angina pectoris: Secondary | ICD-10-CM | POA: Diagnosis not present

## 2019-10-12 DIAGNOSIS — R002 Palpitations: Secondary | ICD-10-CM

## 2019-10-12 DIAGNOSIS — E785 Hyperlipidemia, unspecified: Secondary | ICD-10-CM

## 2019-10-12 DIAGNOSIS — R0609 Other forms of dyspnea: Secondary | ICD-10-CM

## 2019-10-12 DIAGNOSIS — I10 Essential (primary) hypertension: Secondary | ICD-10-CM

## 2019-10-12 DIAGNOSIS — Z9861 Coronary angioplasty status: Secondary | ICD-10-CM

## 2019-10-12 DIAGNOSIS — R5383 Other fatigue: Secondary | ICD-10-CM | POA: Insufficient documentation

## 2019-10-12 DIAGNOSIS — R079 Chest pain, unspecified: Secondary | ICD-10-CM

## 2019-10-12 DIAGNOSIS — R0600 Dyspnea, unspecified: Secondary | ICD-10-CM

## 2019-10-12 DIAGNOSIS — I25119 Atherosclerotic heart disease of native coronary artery with unspecified angina pectoris: Secondary | ICD-10-CM

## 2019-10-12 DIAGNOSIS — I208 Other forms of angina pectoris: Secondary | ICD-10-CM | POA: Insufficient documentation

## 2019-10-12 LAB — SURGICAL PATHOLOGY

## 2019-10-12 MED ORDER — DILTIAZEM HCL ER COATED BEADS 120 MG PO CP24: 120.0000 mg | ORAL_CAPSULE | Freq: Every day | ORAL | 3 refills | Status: DC

## 2019-10-12 NOTE — Telephone Encounter (Signed)
Spoke to pt. Pt given recommendations per Dr Sabra Heck. Pt wants to give urine sample and is scheduled for nurse visit on 10/13/2019 at 845 am. Pt was ok with vaginal estrogen after getting approval by cardiologist.   Routing to Dr Sabra Heck

## 2019-10-12 NOTE — Progress Notes (Signed)
Primary Care Provider: Leonard Downing, MD Cardiologist: [name], MD Electrophysiologist:   Clinic Note: Chief Complaint  Patient presents with  . Coronary Artery Disease    Having her unusual atypical symptoms that were concerning for angina.-Fluttering and dyspnea    HPI:    Carrie Reynolds is a 64 y.o. female with a PMH notable for CAD having angina with recent LAD PCI (with recent repeat PCI for proximal edge dissection-proximal overlapping stent), along with obesity (s/p gastric bypass), OSA-CPAP who presents today post cath follow-up.  [name] was actually last seen by [name], NP on July 16, 2019.  She noted some fatigue and was having a hard time getting her stamina back going to the gym.  The try to push herself but just was limited. ->  Recommended slowly increasing activity.  Discussed possibility of allowing for plastic (tummy tuck) surgery in June 2021.  Recent Hospitalizations: n/a  Reviewed  CV studies:    The following studies were reviewed today: (if available, images/films reviewed: From Epic Chart or Care Everywhere)  n/a   Interval History:   [name] returns here today unfortunately started to have weird symptoms again of little fluttering sensations and dyspnea spells.  Maybe 2 or 3 times a week.  She does not think is related to GERD but she is having some GERD symptoms.  She still feels really short of breath and not really able to do things.  This is very unusual because she had been doing very well for the first month or 2 and now the symptoms are right back again.  She feels tired she feels fluttering.  She not really having chest pain though. She is not really able to do much walking but over the last 3 weeks is really unaware she is barely able to do much because of concern of symptoms.  CV Review of Symptoms (Summary) Cardiovascular ROS: positive for - dyspnea on exertion, irregular heartbeat and She is  noted more frequent fluttering. negative for - chest pain, dyspnea on exertion, edema, orthopnea, palpitations, paroxysmal nocturnal dyspnea, rapid heart rate, shortness of breath or TIA/amaurosis fugax, syncope/near syncope, claudication  The patient DOES NOT have symptoms concerning for COVID-19 infection (fever, chills, cough, or new shortness of breath).  The patient continues to practice social distancing and masking.   -> Has yet to get her Covid vaccine.   REVIEWED OF SYSTEMS   A comprehensive ROS was performed. Review of Systems  Constitutional: Negative for malaise/fatigue (Has not felt like doing things because concern with fluttering dyspnea symptoms.  ).  HENT: Negative for congestion and nosebleeds.   Respiratory: Positive for shortness of breath (Per HPI).   Cardiovascular:       Per HPI  Gastrointestinal: Positive for heartburn. Negative for blood in stool and melena.  Genitourinary: Negative for frequency and hematuria.  Musculoskeletal: Positive for myalgias. Negative for falls and joint pain.  Neurological: Positive for dizziness. Negative for focal weakness.       Symptoms associated with fluttering sensation and dyspnea  Psychiatric/Behavioral: The patient is nervous/anxious (Starting get more anxious now with these episodes.).    I have reviewed and (if needed) personally updated the patient's problem list, medications, allergies, past medical and surgical history, social and family history.   PAST MEDICAL HISTORY   Past Medical History:  Diagnosis Date  . Anxiety   . Arnold-Chiari malformation ([place]) 2006  . CAD S/P PCI 05/06/2019   05/06/2019: prox LAD (@  SPI) PCI with Resolute Onyx DES 3.5  x 8, Residual 60% PDA, Normal LVF; 07/02/2019: relook Cath for Anterior Ischemia on Mhoview --> CATH 70% pre-stent/proximal edge dissection --> proximal overlapping DES (Resolute Onyx 3.5 x 8 - new & old stent post-dilated to 3.8 mm)  . Cervical strain    syndrome  .  Cervicogenic headache   . Concussion with loss of consciousness 10/09/2017  . CTS (carpal tunnel syndrome)   . Depression   . GERD (gastroesophageal reflux disease)   . Glucose intolerance (impaired glucose tolerance)   . High coronary artery calcium score    Score of 1460.  Coronary CTA shows high-grade proximal-mid LAD stenosis (CT FFR 0.50).  Also PDA/PLA 60 to 70% stenosis (CTO FFR 0.75)  . History of kidney stones   . Hypertension   . Migraine headache    with visual aura  . Obesity   . OSA (obstructive sleep apnea)    not treated  . Postmenopausal   . Vertigo    post concussive    PAST SURGICAL HISTORY   Past Surgical History:  Procedure Laterality Date  . BUNIONECTOMY     right great toe  . CHOLECYSTECTOMY N/A 06/05/2018   Procedure: LAPAROSCOPIC CHOLECYSTECTOMY WITH POSSIBLE INTRAOPERATIVE CHOLANGIOGRAM;  Surgeon: Erroll Luna, MD;  Location: [facility];  Service: General;  Laterality: N/A;  . CORONARY STENT INTERVENTION N/A 05/12/2019   Procedure: CORONARY STENT INTERVENTION;  Surgeon: Leonie Man, MD;  Location: [facility];  Service: Cardiovascular;;; Culprit-p-mLAD 95% 950% SP1) --> Scoring Balloon PTCA -> DES PCI (Resolute DES 3.5 x 38 - 3.8 mm; 0% LAD& 55% SP1). -->  SP1 was somewhat jailed with TIMI II flow post PCI.  [name] CORONARY STENT INTERVENTION N/A 07/02/2019   Procedure: CORONARY STENT INTERVENTION;  Surgeon: Leonie Man, MD;  Location: [facility];  Service: Cardiovascular;  Laterality: N/A;  . GASTRIC BYPASS  11/2015   [facility]  . GYNECOLOGIC CRYOSURGERY    . HAND SURGERY    . LEFT HEART CATH AND CORONARY ANGIOGRAPHY N/A 05/12/2019   Procedure: LEFT HEART CATH AND CORONARY ANGIOGRAPHY;  Surgeon: Leonie Man, MD;  Location: [facility];  Service: Cardiovascular;;; Culprit-p-mLAD 95% 950% SP1) --> DES PCI.;  RPAV 60% -small caliber vessel, did not appear flow-limiting (medical management).  EF 55 to 60%.  Normal LVEDP.  [name] LEFT  HEART CATH AND CORONARY ANGIOGRAPHY N/A 07/02/2019   Procedure: LEFT HEART CATH AND CORONARY ANGIOGRAPHY;  Surgeon: Leonie Man, MD;  Location: [facility];  Service: Cardiovascular;  Laterality: N/A;  . NM MYOVIEW LTD  06/24/2019   [facility]): HIGH RISK.  EF 55%.  Large size, moderate severe defect in the anterior wall with associated anterior hypokinesis.  Suspect LAD territory.  . TUBAL LIGATION Bilateral   . VAGINAL HYSTERECTOMY  2003   with LSO and right salpingectomy; right ovary still present    Myoview June 24, 2019: HIGH RISK.  EF 55%.  Large size, moderate severe defect in the anterior wall with associated anterior hypokinesis.  Suspect LAD territory. Relook CATH-PCI 07/02/2019: proxLAD proximal edge dissection - covered with 2nd Resolute Onyx DES 3.5 x 8 (post-dilated to 3.8 mm)   MEDICATIONS/ALLERGIES   No outpatient medications have been marked as taking for the 10/12/19 encounter (Office Visit) with Leonie Man, MD.    Allergies  Allergen Reactions  . Ceclor [Cefaclor] Hives and Rash  . Tetracyclines & Related Hives  . Lipitor [employer]  Calcium] Cough  . Penicillins Swelling and Other (See Comments)    Has patient had a PCN reaction causing immediate rash, facial/tongue/throat swelling, SOB or lightheadedness with hypotension: No Has patient had a PCN reaction causing severe rash involving mucus membranes or skin necrosis: No Has patient had a PCN reaction that required hospitalization: Yes Has patient had a PCN reaction occurring within the last 10 years: No If all of the above answers are "NO", then may proceed with Cephalosporin use.  [name] Amoxicillin Rash  . Ampicillin Rash  . Lisinopril Cough  . Simvastatin Cough    SOCIAL HISTORY/FAMILY HISTORY   Social History   Tobacco Use  . Smoking status: Former Smoker    Packs/day: 0.10    Years: 30.00    Pack years: 3.00    Types: Cigarettes    Quit date: 04/26/2012    Years since quitting:  7.4  . Smokeless tobacco: Never Used  Substance Use Topics  . Alcohol use: Yes    Comment: occasional  . Drug use: No   Social History   Social History Narrative   ** Merged History Encounter **       Lives with husband [name]). She travels very frequently with her job and is gone most weeks out of the month. She is active and exercises when able.    Family History family history includes Alzheimer's disease in her father and paternal grandmother; Cancer in her father and paternal grandfather; Hyperlipidemia in her sister; Hypertension in her mother and sister; Migraines in her father.   OBJCTIVE -PE, EKG, labs   Wt Readings from Last 3 Encounters:  10/13/19 205 lb (93 kg)  10/12/19 203 lb (92.1 kg)  10/08/19 202 lb 6.4 oz (91.8 kg)    Physical Exam: BP 134/78   Pulse (!) 53   Temp (!) 96.4 F (35.8 C)   Ht 5\' 8"  (1.727 m)   Wt 203 lb (92.1 kg)   LMP 07/24/2003 (Within Months)   SpO2 97%   BMI 30.87 kg/m  Physical Exam  Constitutional: She is oriented to person, place, and time. She appears well-developed and well-nourished. No distress.  Well-groomed.  Healthy-appearing.  In great spirits.  HENT:  Head: Normocephalic and atraumatic.  Eyes: EOM are normal.  Neck: Normal carotid pulses, no hepatojugular reflux and no JVD present. Carotid bruit is not present. No tracheal deviation present.  Cardiovascular: Normal rate, regular rhythm, normal heart sounds and intact distal pulses.  Occasional extrasystoles are present. PMI is not displaced. Exam reveals no gallop and no friction rub.  No murmur heard. Pulmonary/Chest: Effort normal and breath sounds normal. No respiratory distress. She has no wheezes. She has no rales.  Musculoskeletal:        General: No edema. Normal range of motion.     Cervical back: Normal range of motion and neck supple.  Neurological: She is alert and oriented to person, place, and time.  Psychiatric: She has a normal mood and affect. Her  behavior is normal. Judgment and thought content normal.  Back to being here anxious nature  Vitals reviewed.   Adult ECG Report Sinus bradycardia, rate 53 bpm.  Recent Labs:    --From 2020: TC 234, TG 80.5, HDL 33, LDL 136-->LDL-P22056* (Very High >2000, goal <1000), HDL-P 35.4 (goal> 30.5).Small LDL-P9 51 (high.Goal<527). LDL size 20.9nm (goal >20.5).LP-IR score 74 (high,goal<45)  Lab Results  Component Value Date   CREATININE 0.80 07/01/2019   BUN 16 07/01/2019   NA 141 07/01/2019  K 3.6 07/01/2019   CL 108 (H) 07/01/2019   CO2 26 07/01/2019    ASSESSMENT/PLAN    Problem List Items Addressed This Visit    CAD S/P DES PCI-proximal LAD - Primary (Chronic)    Initial PCI of the LAD was relatively tough procedure requiring high-pressure balloon angioplasty prior to stent placement.  There probably was a dissection not fully covered and that is what led to the issue requiring overlapping stent to be placed proximally in the last cath.  Her symptoms are really unusual and is very difficult to assess.  I hesitate to take her directly to the Cath Lab with this is a very difficult distal incision. The same plan will be to check where ischemia with a Myoview stress test and then plus minus cath based on that.  For now she will continue with aspirin Plavix as well as ARB and statin.  We will switch her from Bystolic to diltiazem perhaps this will help lessen fatigue and treat palpitations from a different angle.      Relevant Medications   diltiazem (CARDIZEM CD) 120 MG 24 hr capsule   Other Relevant Orders   Comprehensive metabolic panel   CBC   TSH   MYOCARDIAL PERFUSION IMAGING   Coronary artery disease involving native coronary artery of native heart with angina pectoris ([place]) (Chronic)    For now will stay on aspirin and Plavix.  Need to reassess with Myoview before we can say it is okay to consider holding Plavix for potential surgery in June.  Otherwise  nontender regimen. Converting beta-blocker to calcium channel blocker, continue ARB and statin.      Relevant Medications   diltiazem (CARDIZEM CD) 120 MG 24 hr capsule   Other Relevant Orders   Comprehensive metabolic panel   TSH   Essential hypertension (Chronic)    Blood pressure is a little high today but looks pretty good.  Bystolic is probably better medicine for blood pressure than diltiazem, however I wonder if that could be causing some fatigue.    Plan for now:  continue the ARB-HCTZ at current dose -> reevaluate after Myoview.  May need to increase this to full tablet.  Convert from Bystolic to diltiazem XT 123456 mg.        Relevant Medications   diltiazem (CARDIZEM CD) 120 MG 24 hr capsule   Dyslipidemia, goal LDL below 70 (closer to 55) (Chronic)    Follow-up labs. Continue current dose statin.      Relevant Medications   diltiazem (CARDIZEM CD) 120 MG 24 hr capsule   Other Relevant Orders   Lipid panel   Rapid palpitations (Chronic)    She still has these fluttering palpitation spells which may be what she is actually feeling and not true angina.  I am worried that she may be having fatigue and exercise intolerance from the Bystolic.  We will switch her from Bystolic to diltiazem [id number] mg.      DOE (dyspnea on exertion)    Symptoms are not as bad as last episode, but with known single-vessel disease only, I think we can reevaluate with a Myoview to see if there is any issue with anterior ischemia and the possibility of inferior ischemia with the moderate distal right branch disease.  Plan:  relook Lexiscan Myoview.      Relevant Orders   Lipid panel   Comprehensive metabolic panel   CBC   TSH   MYOCARDIAL PERFUSION IMAGING   Atypical angina (HCC)  Very unusual symptoms and this is a problem because they are not classic at all.  Unfortunately last night she had the symptoms was when she had the upstream problem with the stent.  That showed up was grossly  positive on stress test.  As such we will reevaluate with a Myoview stress test.  Would prefer this option to going straight back to the Cath Lab. We will switch gears and from beta-blocker to calcium channel blocker for antianginal benefit.       Relevant Medications   diltiazem (CARDIZEM CD) 120 MG 24 hr capsule   Other Relevant Orders   Lipid panel   Comprehensive metabolic panel   CBC   MYOCARDIAL PERFUSION IMAGING   Fatigue   Relevant Orders   Lipid panel   Comprehensive metabolic panel   CBC   TSH   MYOCARDIAL PERFUSION IMAGING    Other Visit Diagnoses    Chest pain, unspecified type       Relevant Orders   MYOCARDIAL PERFUSION IMAGING      COVID-19 Education: The signs and symptoms of COVID-19 were discussed with the patient and how to seek care for testing (follow up with PCP or arrange E-visit).   The importance of social distancing was discussed today.  I spent a total of 18 minutes with the patient and chart review. >  50% of the time was spent in direct patient consultation.  Additional time spent with chart review (studies, outside notes, etc): 10 Total Time: 28 min   Current medicines are reviewed at length with the patient today.  (+/- concerns) none   Patient Instructions / Medication Changes & Studies & Tests Ordered   Patient Instructions  Medication Instructions:   wean off Bystolic 2.5 mg  take 1/2 tablet of 2.5 mg daily for 1 week then stop .  Once you have stopped medication  start Diltiazem CD 120 mg take at bedtime    *If you need a refill on your cardiac medications before your next appointment, please call your pharmacy*   Lab Work: CBC CMP LIPID TSH FASTING - yo may do labs the day you have test     If you have labs (blood work) drawn today and your tests are completely normal, you will receive your results only by: [name] MyChart Message (if you have MyChart) OR . A paper copy in the mail If you have any lab test that is abnormal or we  need to change your treatment, we will call you to review the results.   Testing/Procedures: Will be schedule at [place] has requested that you have a lexiscan myoview. Please follow instruction sheet, as given.   Follow-Up: At [facility], you and your health needs are our priority.  As part of our continuing mission to provide you with exceptional heart care, we have created designated Provider Care Teams.  These Care Teams include your primary Cardiologist (physician) and Advanced Practice Providers (APPs -  Physician Assistants and Nurse Practitioners) who all work together to provide you with the care you need, when you need it.  We recommend signing up for the patient portal called "MyChart".  Sign up information is provided on this After Visit Summary.  MyChart is used to connect with patients for Virtual Visits (Telemedicine).  Patients are able to view lab/test results, encounter notes, upcoming appointments, etc.  Non-urgent messages can be sent to your provider as well.   To learn more about what you  can do with MyChart, go to [email or web address].    Your next appointment:   1 month(s)  Keep appt for 11/03/19  The format for your next appointment:   In Person  Provider:   [name], MD      Studies Ordered:   Orders Placed This Encounter  Procedures  . Lipid panel  . Comprehensive metabolic panel  . CBC  . TSH  . MYOCARDIAL PERFUSION IMAGING     [name], M.D., M.S. Interventional Cardiologist   Pager # [phone] Phone # [phone] [address] [place],  [address]   Thank you for choosing Heartcare at [facility]!!

## 2019-10-12 NOTE — Telephone Encounter (Signed)
Spoke to patient- she will be able to come to appointment today .

## 2019-10-12 NOTE — Patient Instructions (Signed)
Medication Instructions:   wean off Bystolic 2.5 mg  take 1/2 tablet of 2.5 mg daily for 1 week then stop .  Once you have stopped medication  start Diltiazem CD 120 mg take at bedtime    *If you need a refill on your cardiac medications before your next appointment, please call your pharmacy*   Lab Work: CBC CMP LIPID TSH FASTING - yo may do labs the day you have test     If you have labs (blood work) drawn today and your tests are completely normal, you will receive your results only by: [name] MyChart Message (if you have MyChart) OR . A paper copy in the mail If you have any lab test that is abnormal or we need to change your treatment, we will call you to review the results.   Testing/Procedures: Will be schedule at [facility] has requested that you have a lexiscan myoview. Please follow instruction sheet, as given.   Follow-Up: At [facility], you and your health needs are our priority.  As part of our continuing mission to provide you with exceptional heart care, we have created designated Provider Care Teams.  These Care Teams include your primary Cardiologist (physician) and Advanced Practice Providers (APPs -  Physician Assistants and Nurse Practitioners) who all work together to provide you with the care you need, when you need it.  We recommend signing up for the patient portal called "MyChart".  Sign up information is provided on this After Visit Summary.  MyChart is used to connect with patients for Virtual Visits (Telemedicine).  Patients are able to view lab/test results, encounter notes, upcoming appointments, etc.  Non-urgent messages can be sent to your provider as well.   To learn more about what you can do with MyChart, go to [email or web address].    Your next appointment:   1 month(s)  Keep appt for 11/03/19  The format for your next appointment:   In Person  Provider:   [name], MD

## 2019-10-12 NOTE — Telephone Encounter (Signed)
I did not do any urine tests as she's been followed at [facility] and has undergone several urine tests.  Her vaginal biopsy only showed ulceration and reactive tissue.  I would suggest treating the small ulcerative area with some topical estrogen therapy or topical silver nitrate (a topical cautery agent) to get this to stop bleeding.    If she desires to use vaginal estrogen, then I will need to review with her cardiologist.    She can come and leave urine sample tomorrow if desires for dip u/a, urine micro and culture or for OV.  Ok to schedule through lunch time if needed.  Thanks.

## 2019-10-12 NOTE — Telephone Encounter (Signed)
Spoke with pt. Pt states having urinary burning and odor for 1 week. Pt was seen on 10/08/2019 for vaginal lesion. Pt states wanting results of UTI from [place]. Pt has been under [facility] for care.. Will discuss with Dr Sabra Heck for plan and will return call to pt. Pt agreeable.   Routing to Dr Sabra Heck for review and recommendations.

## 2019-10-12 NOTE — Telephone Encounter (Signed)
Left message for pt to call back to triage RN.  

## 2019-10-12 NOTE — Telephone Encounter (Signed)
Patient is returning call.  °

## 2019-10-12 NOTE — Telephone Encounter (Signed)
Left message on patient answering machine to call back  - to let RN know if she can come to appointment today   Message for mychart --"Good morning,  Ms Blaes we have an opening this afternoon at 4 pm if , you can come to the office to discuss with Dr Ellyn Hack. I did leave you a message on your answer machine this morning. You can respond by this e-mail if you like   Ivin Booty RN"

## 2019-10-12 NOTE — Telephone Encounter (Signed)
Patient would like to discuss test results and an issue she is having.

## 2019-10-13 ENCOUNTER — Other Ambulatory Visit: Payer: Self-pay

## 2019-10-13 ENCOUNTER — Encounter: Payer: Self-pay | Admitting: Obstetrics & Gynecology

## 2019-10-13 ENCOUNTER — Ambulatory Visit (INDEPENDENT_AMBULATORY_CARE_PROVIDER_SITE_OTHER): Payer: Managed Care, Other (non HMO)

## 2019-10-13 ENCOUNTER — Telehealth: Payer: Self-pay | Admitting: Cardiology

## 2019-10-13 VITALS — BP 122/72 | HR 64 | Temp 97.5°F | Ht 68.0 in | Wt 205.0 lb

## 2019-10-13 DIAGNOSIS — R300 Dysuria: Secondary | ICD-10-CM | POA: Diagnosis not present

## 2019-10-13 LAB — POCT URINALYSIS DIPSTICK
Bilirubin, UA: NEGATIVE
Glucose, UA: NEGATIVE
Ketones, UA: NEGATIVE
Nitrite, UA: NEGATIVE
Protein, UA: POSITIVE — AB
Urobilinogen, UA: 0.2 E.U./dL
pH, UA: 5 (ref 5.0–8.0)

## 2019-10-13 MED ORDER — SULFAMETHOXAZOLE-TRIMETHOPRIM 800-160 MG PO TABS: 1.0000 | ORAL_TABLET | Freq: Two times a day (BID) | ORAL | 0 refills | Status: AC

## 2019-10-13 NOTE — Progress Notes (Signed)
Patient here for urinalysis, cx and micro. Patient complaining of burning with urination and urinary odor symptoms started middle of last week. Clean catch urine provided and sent for cx and micro. Patient advised would be updated when results received.

## 2019-10-13 NOTE — Telephone Encounter (Signed)
Left message for patient to call and schedule Lexiscan myoview ordered by Dr. Ellyn Hack

## 2019-10-14 ENCOUNTER — Encounter: Payer: Self-pay | Admitting: Obstetrics & Gynecology

## 2019-10-14 ENCOUNTER — Encounter: Payer: Self-pay | Admitting: Cardiology

## 2019-10-14 LAB — CYTOLOGY - PAP
Comment: NEGATIVE
Diagnosis: NEGATIVE
High risk HPV: NEGATIVE

## 2019-10-14 LAB — URINALYSIS, MICROSCOPIC ONLY
Casts: NONE SEEN /lpf
Epithelial Cells (non renal): >10 /hpf — AB (ref 0–10)

## 2019-10-14 NOTE — Assessment & Plan Note (Signed)
Very unusual symptoms and this is a problem because they are not classic at all.  Unfortunately last night she had the symptoms was when she had the upstream problem with the stent.  That showed up was grossly positive on stress test.  As such we will reevaluate with a Myoview stress test.  Would prefer this option to going straight back to the Cath Lab. We will switch gears and from beta-blocker to calcium channel blocker for antianginal benefit.

## 2019-10-14 NOTE — Telephone Encounter (Signed)
Encounter closed

## 2019-10-14 NOTE — Assessment & Plan Note (Signed)
Initial PCI of the LAD was relatively tough procedure requiring high-pressure balloon angioplasty prior to stent placement.  There probably was a dissection not fully covered and that is what led to the issue requiring overlapping stent to be placed proximally in the last cath.  Her symptoms are really unusual and is very difficult to assess.  I hesitate to take her directly to the Cath Lab with this is a very difficult distal incision. The same plan will be to check where ischemia with a Myoview stress test and then plus minus cath based on that.  For now she will continue with aspirin Plavix as well as ARB and statin.  We will switch her from Bystolic to diltiazem perhaps this will help lessen fatigue and treat palpitations from a different angle.

## 2019-10-14 NOTE — Telephone Encounter (Signed)
Rx for bactrim DS BID x 3 days sent to pharmacy and Terence Lux, CMA, made pt aware of results.  I will make medication adjustments if urine culture shows different abx should have been used.  Pt is aware.  Ok to close encounter.

## 2019-10-14 NOTE — Assessment & Plan Note (Addendum)
For now will stay on aspirin and Plavix.  Need to reassess with Myoview before we can say it is okay to consider holding Plavix for potential surgery in June.  Otherwise nontender regimen. Converting beta-blocker to calcium channel blocker, continue ARB and statin.

## 2019-10-14 NOTE — Assessment & Plan Note (Signed)
Blood pressure is a little high today but looks pretty good.  Bystolic is probably better medicine for blood pressure than diltiazem, however I wonder if that could be causing some fatigue.    Plan for now:  continue the ARB-HCTZ at current dose -> reevaluate after Myoview.  May need to increase this to full tablet.  Convert from Bystolic to diltiazem XT 123456 mg.

## 2019-10-15 ENCOUNTER — Other Ambulatory Visit (INDEPENDENT_AMBULATORY_CARE_PROVIDER_SITE_OTHER): Payer: Managed Care, Other (non HMO)

## 2019-10-15 ENCOUNTER — Encounter: Payer: Self-pay | Admitting: Cardiology

## 2019-10-15 ENCOUNTER — Telehealth: Payer: Self-pay | Admitting: *Deleted

## 2019-10-15 DIAGNOSIS — I10 Essential (primary) hypertension: Secondary | ICD-10-CM | POA: Diagnosis not present

## 2019-10-15 LAB — URINE CULTURE

## 2019-10-15 NOTE — Assessment & Plan Note (Signed)
Follow-up labs. Continue current dose statin.

## 2019-10-15 NOTE — Assessment & Plan Note (Signed)
She still has these fluttering palpitation spells which may be what she is actually feeling and not true angina.  I am worried that she may be having fatigue and exercise intolerance from the Bystolic.  We will switch her from Bystolic to diltiazem [id number] mg.

## 2019-10-15 NOTE — Assessment & Plan Note (Addendum)
Symptoms are not as bad as last episode, but with known single-vessel disease only, I think we can reevaluate with a Myoview to see if there is any issue with anterior ischemia and the possibility of inferior ischemia with the moderate distal right branch disease.  Plan:  relook Lexiscan Myoview.

## 2019-10-15 NOTE — Telephone Encounter (Signed)
See telephone encounter dated 10/15/19.   Encounter closed.

## 2019-10-15 NOTE — Telephone Encounter (Signed)
Dr. Sabra Heck -please review 10/08/19 pap results and advise.

## 2019-10-15 NOTE — Telephone Encounter (Signed)
Carrie Reynolds, [facility]  Phone Number: [phone]  Hey Dr. Sabra Heck,   Sooooo, I'm thinking there's some bad stuff in these results.Marland KitchenMarland KitchenMarland KitchenSaw the results from the Pap, not really sure what they mean...   Shirline Frees

## 2019-10-16 NOTE — Telephone Encounter (Signed)
Spoke with patient. Advised of results and message as seen below from [place]. Patient verbalizes understanding. Offered appointment today with Dr.Miller. Patient declines. Patient would like to be seen next week. Aware Dr.Miller is out of the office and requests to be seen with another MD. Appointment scheduled for 10/20/2019 at 3 pm with Dr.Jertson. Patient is agreeable to date and time.  Cc: Dr.Jertson  Routing to provider and will close encounter.

## 2019-10-16 NOTE — Telephone Encounter (Signed)
Result note routed to you.  Pap normal.  Biopsy showed no abnormal cells.  Urine culture was negative.  Urine micro showed red cells which is being followed by Dr. Matilde Sprang.  As the vaginal area is very small, I think we could treat it with topical silver nitrate and get it to resolve that way.

## 2019-10-19 ENCOUNTER — Other Ambulatory Visit: Payer: Self-pay

## 2019-10-20 ENCOUNTER — Ambulatory Visit: Payer: Managed Care, Other (non HMO) | Admitting: Obstetrics and Gynecology

## 2019-10-21 ENCOUNTER — Telehealth (HOSPITAL_COMMUNITY): Payer: Self-pay

## 2019-10-21 NOTE — Telephone Encounter (Signed)
Encounter complete. 

## 2019-10-23 ENCOUNTER — Other Ambulatory Visit: Payer: Self-pay

## 2019-10-23 ENCOUNTER — Ambulatory Visit (HOSPITAL_COMMUNITY)
Admission: RE | Admit: 2019-10-23 | Discharge: 2019-10-23 | Disposition: A | Discharge status: Home / Self Care | Payer: Managed Care, Other (non HMO) | Source: Ambulatory Visit | Attending: Cardiology | Admitting: Cardiology

## 2019-10-23 DIAGNOSIS — I208 Other forms of angina pectoris: Secondary | ICD-10-CM | POA: Insufficient documentation

## 2019-10-23 DIAGNOSIS — Z9861 Coronary angioplasty status: Secondary | ICD-10-CM | POA: Insufficient documentation

## 2019-10-23 DIAGNOSIS — R0600 Dyspnea, unspecified: Secondary | ICD-10-CM | POA: Insufficient documentation

## 2019-10-23 DIAGNOSIS — R5383 Other fatigue: Secondary | ICD-10-CM | POA: Insufficient documentation

## 2019-10-23 DIAGNOSIS — R079 Chest pain, unspecified: Secondary | ICD-10-CM | POA: Diagnosis present

## 2019-10-23 DIAGNOSIS — I2510 Atherosclerotic heart disease of native coronary artery without angina pectoris: Secondary | ICD-10-CM | POA: Insufficient documentation

## 2019-10-23 DIAGNOSIS — R0609 Other forms of dyspnea: Secondary | ICD-10-CM

## 2019-10-23 HISTORY — PX: NM MYOVIEW LTD: HXRAD82

## 2019-10-23 LAB — TSH: TSH: 2.36 u[IU]/mL (ref 0.450–4.500)

## 2019-10-23 LAB — COMPREHENSIVE METABOLIC PANEL
ALT: 12 IU/L (ref 0–32)
AST: 11 IU/L (ref 0–40)
Albumin/Globulin Ratio: 1.6 (ref 1.2–2.2)
Albumin: 4.3 g/dL (ref 3.8–4.8)
Alkaline Phosphatase: 55 IU/L (ref 39–117)
BUN/Creatinine Ratio: 22 (ref 12–28)
BUN: 21 mg/dL (ref 8–27)
Bilirubin Total: 0.5 mg/dL (ref 0.0–1.2)
CO2: 25 mmol/L (ref 20–29)
Calcium: 9.4 mg/dL (ref 8.7–10.3)
Chloride: 105 mmol/L (ref 96–106)
Creatinine, Ser: 0.95 mg/dL (ref 0.57–1.00)
GFR calc Af Amer: 74 mL/min/{1.73_m2} (ref >59)
GFR calc non Af Amer: 64 mL/min/{1.73_m2} (ref >59)
Globulin, Total: 2.7 g/dL (ref 1.5–4.5)
Glucose: 96 mg/dL (ref 65–99)
Potassium: 4.2 mmol/L (ref 3.5–5.2)
Sodium: 142 mmol/L (ref 134–144)
Total Protein: 7 g/dL (ref 6.0–8.5)

## 2019-10-23 LAB — CBC
Hematocrit: 40.3 % (ref 34.0–46.6)
Hemoglobin: 13.7 g/dL (ref 11.1–15.9)
MCH: 30 pg (ref 26.6–33.0)
MCHC: 34 g/dL (ref 31.5–35.7)
MCV: 88 fL (ref 79–97)
Platelets: 183 10*3/uL (ref 150–450)
RBC: 4.56 x10E6/uL (ref 3.77–5.28)
RDW: 13 % (ref 11.7–15.4)
WBC: 5.1 10*3/uL (ref 3.4–10.8)

## 2019-10-23 LAB — LIPID PANEL
Chol/HDL Ratio: 2.9 ratio (ref 0.0–4.4)
Cholesterol, Total: 149 mg/dL (ref 100–199)
HDL: 52 mg/dL (ref >39)
LDL Chol Calc (NIH): 66 mg/dL (ref 0–99)
Triglycerides: 189 mg/dL — ABNORMAL HIGH (ref 0–149)
VLDL Cholesterol Cal: 31 mg/dL (ref 5–40)

## 2019-10-23 LAB — MYOCARDIAL PERFUSION IMAGING
LV dias vol: 106 mL (ref 46–106)
LV sys vol: 47 mL
Peak HR: 85 {beats}/min
Rest HR: 47 {beats}/min
SDS: 0
SRS: 2
SSS: 2
TID: 1.07

## 2019-10-23 MED ORDER — REGADENOSON 0.4 MG/5ML IV SOLN
0.4000 mg | Freq: Once | INTRAVENOUS | Status: AC
  Administered 2019-10-23: 0.4 mg via INTRAVENOUS

## 2019-10-23 MED ORDER — TECHNETIUM TC 99M TETROFOSMIN IV KIT
31.9000 | PACK | Freq: Once | INTRAVENOUS | Status: AC | PRN
  Administered 2019-10-23: 31.9 via INTRAVENOUS
  Filled 2019-10-23: qty 32

## 2019-10-23 MED ORDER — TECHNETIUM TC 99M TETROFOSMIN IV KIT
10.1000 | PACK | Freq: Once | INTRAVENOUS | Status: AC | PRN
  Administered 2019-10-23: 10.1 via INTRAVENOUS
  Filled 2019-10-23: qty 11

## 2019-10-27 ENCOUNTER — Ambulatory Visit (HOSPITAL_COMMUNITY)
Admission: RE | Admit: 2019-10-27 | Payer: Managed Care, Other (non HMO) | Source: Home / Self Care | Admitting: Gastroenterology

## 2019-10-27 ENCOUNTER — Encounter (HOSPITAL_COMMUNITY): Admission: RE | Payer: Self-pay | Source: Home / Self Care

## 2019-10-27 SURGERY — COLONOSCOPY WITH PROPOFOL
Anesthesia: Monitor Anesthesia Care

## 2019-10-27 NOTE — Progress Notes (Signed)
GYNECOLOGY  VISIT  CC:   Check after vaginal biopsy, possible treatment today  HPI: 64 y.o. G70P2002 Married White or Caucasian female here for follow-up.  Patient was seen originally on March 18 in referral from Dr. Matilde Sprang due to vaginal apex lesion that was noted on physical exam.  She has been evaluated for was originally thought to be microscopic hematuria.  Patient reports she is done well since the biopsy and has actually had no further bleeding.  The biopsy was benign and showed reactive squamous mucosa with ulcer mixed inflammation.  She also had a Pap smear that was negative.  High risk HPV testing was negative as well.  Possible treatment with silver nitrate discussed but will need to reexamine her today before proceeding.  Pt reports her biggest issue now is fatigue.  Just feels exhausted with almost anything she does.  Has been fully evaluated by cardiology.  Pt is aware I am concerned about her estrogen pellet use.  I do not write this for her and do any hormonal pellet administration.  I have encouraged her to discuss this with her cardiologist.   She does have hx of bariatric surgery.  Has not done recent follow up with bariatric surgeon/provider.  Has not had recent lab work for possible vitamin/nutrient deficiencies that are sometimes seen after bariatric surgery.  reviewed possible nutritional causes of fatigue.  Pt would like to proceed with blood work today.  GYNECOLOGIC HISTORY: Patient's last menstrual period was 07/24/2003 (within months). Contraception: hysterectomy Menopausal hormone therapy: none  Patient Active Problem List   Diagnosis Date Noted  . Atypical angina ([place]) 10/12/2019  . Fatigue 10/12/2019  . Abnormal nuclear cardiac imaging test 07/02/2019  . S/P drug eluting coronary stent placement   . Coronary artery disease involving native coronary artery of native heart with angina pectoris ([place]) 06/17/2019  . Abnormal finding on EKG 06/16/2019  . DOE  (dyspnea on exertion) 06/16/2019  . CAD S/P DES PCI-proximal LAD 05/20/2019  . Abnormal cardiac CT angiography 05/12/2019  . Preop cardiovascular exam   . Agatston coronary artery calcium score greater than 400 04/16/2019  . Dyslipidemia, goal LDL below 70 (closer to 55) 04/16/2019  . Rapid palpitations 04/16/2017  . Systolic murmur [id number]  . Near syncope 04/16/2017  . OSA on CPAP 11/19/2013  . Obesity hypoventilation syndrome ([place]) 11/19/2013  . Sleep apnea 04/25/2012  . Obesity (BMI 30.0-34.9) 04/25/2012  . Depression 04/25/2012  . Environmental allergies 04/25/2012  . Essential hypertension 04/25/2012  . Postmenopausal 09/18/2011  . LEG EDEMA 03/16/2010    Past Medical History:  Diagnosis Date  . Anxiety   . Arnold-Chiari malformation ([place]) 2006  . CAD S/P PCI 05/06/2019   05/06/2019: prox LAD (@ SPI) PCI with Resolute Onyx DES 3.5  x 8, Residual 60% PDA, Normal LVF; 07/02/2019: relook Cath for Anterior Ischemia on Mhoview --> CATH 70% pre-stent/proximal edge dissection --> proximal overlapping DES (Resolute Onyx 3.5 x 8 - new & old stent post-dilated to 3.8 mm)  . Cervical strain    syndrome  . Cervicogenic headache   . Concussion with loss of consciousness 10/09/2017  . CTS (carpal tunnel syndrome)   . Depression   . GERD (gastroesophageal reflux disease)   . Glucose intolerance (impaired glucose tolerance)   . High coronary artery calcium score    Score of 1460.  Coronary CTA shows high-grade proximal-mid LAD stenosis (CT FFR 0.50).  Also PDA/PLA 60 to 70% stenosis (CTO FFR 0.75)  . History of  kidney stones   . Hypertension   . Migraine headache    with visual aura  . Obesity   . OSA (obstructive sleep apnea)    not treated  . Postmenopausal   . Vertigo    post concussive    Past Surgical History:  Procedure Laterality Date  . BUNIONECTOMY     right great toe  . CHOLECYSTECTOMY N/A 06/05/2018   Procedure: LAPAROSCOPIC CHOLECYSTECTOMY WITH POSSIBLE  INTRAOPERATIVE CHOLANGIOGRAM;  Surgeon: Erroll Luna, MD;  Location: [facility];  Service: General;  Laterality: N/A;  . CORONARY STENT INTERVENTION N/A 05/12/2019   Procedure: CORONARY STENT INTERVENTION;  Surgeon: Leonie Man, MD;  Location: [facility];  Service: Cardiovascular;;; Culprit-p-mLAD 95% 950% SP1) --> Scoring Balloon PTCA -> DES PCI (Resolute DES 3.5 x 38 - 3.8 mm; 0% LAD& 55% SP1). -->  SP1 was somewhat jailed with TIMI II flow post PCI.  [name] CORONARY STENT INTERVENTION N/A 07/02/2019   Procedure: CORONARY STENT INTERVENTION;  Surgeon: Leonie Man, MD;  Location: [facility];  Service: Cardiovascular;  Laterality: N/A;  . GASTRIC BYPASS  11/2015   [facility]  . GYNECOLOGIC CRYOSURGERY    . HAND SURGERY    . LEFT HEART CATH AND CORONARY ANGIOGRAPHY N/A 05/12/2019   Procedure: LEFT HEART CATH AND CORONARY ANGIOGRAPHY;  Surgeon: Leonie Man, MD;  Location: [facility];  Service: Cardiovascular;;; Culprit-p-mLAD 95% 950% SP1) --> DES PCI.;  RPAV 60% -small caliber vessel, did not appear flow-limiting (medical management).  EF 55 to 60%.  Normal LVEDP.  [name] LEFT HEART CATH AND CORONARY ANGIOGRAPHY N/A 07/02/2019   Procedure: LEFT HEART CATH AND CORONARY ANGIOGRAPHY;  Surgeon: Leonie Man, MD;  Location: [facility];  Service: Cardiovascular;  Laterality: N/A;  . NM MYOVIEW LTD  06/24/2019   Carlton Adam): HIGH RISK.  EF 55%.  Large size, moderate severe defect in the anterior wall with associated anterior hypokinesis.  Suspect LAD territory.  . TUBAL LIGATION Bilateral   . VAGINAL HYSTERECTOMY  2003   with LSO and right salpingectomy; right ovary still present    MEDS:   Current Outpatient Medications on File Prior to Visit  Medication Sig Dispense Refill  . aspirin EC 81 MG tablet Take 1 tablet (81 mg total) by mouth daily. 90 tablet 3  . Cholecalciferol (VITAMIN D3) 125 MCG (5000 UT) TABS Take 5,000 Units by mouth 2 (two) times daily.     .  clopidogrel (PLAVIX) 75 MG tablet Take 1 tablet (75 mg total) by mouth daily. 90 tablet 3  . Coenzyme Q10 (CO Q10) 200 MG CAPS Take 400 mg by mouth daily.     [name] DHEA 25 MG CAPS Take 25 mg by mouth daily.     [name] diltiazem (CARDIZEM CD) 120 MG 24 hr capsule Take 1 capsule (120 mg total) by mouth daily. 90 capsule 3  . donepezil (ARICEPT) 5 MG tablet     . losartan-hydrochlorothiazide (HYZAAR) 100-12.5 MG tablet Take 0.5 tablets by mouth daily. 30 tablet 11  . Magnesium 250 MG TABS Take 250 mg by mouth daily.     . Multiple Vitamins-Minerals (MULTIVITAMIN WOMEN PO) Take 1 tablet by mouth daily.    . ondansetron (ZOFRAN) 8 MG tablet as needed.    . pantoprazole (PROTONIX) 40 MG tablet Take 40 mg by mouth as needed Jerrye Bushy).     . rosuvastatin (CRESTOR) 20 MG tablet Take 1 tablet (20 mg total) by mouth daily. [place]  tablet 3  . Testosterone 100 MG PLLT by Implant route.    . vitamin B-12 (CYANOCOBALAMIN) 1000 MCG tablet Take 1,000 mcg by mouth daily.    . nitroGLYCERIN (NITROSTAT) 0.4 MG SL tablet Place 1 tablet (0.4 mg total) under the tongue every 5 (five) minutes as needed. (Patient not taking: Reported on 10/29/2019) 25 tablet 2   No current facility-administered medications on file prior to visit.    ALLERGIES: Ceclor [cefaclor], Tetracyclines & related, Lipitor [atorvastatin calcium], Penicillins, Amoxicillin, Ampicillin, Lisinopril, and Simvastatin  Family History  Problem Relation Age of Onset  . Hypertension Mother   . Cancer Father        lung  . Alzheimer's disease Father   . Migraines Father   . Hyperlipidemia Sister   . Hypertension Sister   . Alzheimer's disease Paternal Grandmother   . Cancer Paternal Grandfather        lung    SH:  Married, non smoker (quite 2013)  Review of Systems  Constitutional: Negative.   HENT: Negative.   Eyes: Negative.   Respiratory: Negative.   Cardiovascular: Negative.   Gastrointestinal: Negative.   Endocrine: Negative.   Genitourinary:  Negative.   Musculoskeletal: Negative.   Skin: Negative.   Allergic/Immunologic: Negative.   Neurological: Negative.   Psychiatric/Behavioral: Negative.     PHYSICAL EXAMINATION:    BP 106/64   Pulse 70   Temp 98.1 F (36.7 C) (Skin)   Resp 16   Wt 203 lb (92.1 kg)   LMP 07/24/2003 (Within Months)   BMI 30.87 kg/m      General appearance: alert, cooperative and appears stated age  Lymph:  no inguinal LAD noted  Pelvic: External genitalia:  no lesions              Urethra:  normal appearing urethra with no masses, tenderness or lesions              Bartholins and Skenes: normal                 Vagina: normal appearing vagina with normal color and discharge, no lesions, are of prior biopsy is healing well and ulceration is no longer present.  No treatment recommended.              Cervix: absent              Bimanual Exam:  Uterus:  uterus absent              Adnexa: no mass, fullness, tenderness               Chaperone, Joy Johnson CMA, was present for exam.  Assessment: H/o vaginal ulceration that has resolved after biopsy of lesion Vaginal bleeding that seems to have resolved as well Fatigue HRT use that pt knows I am concerned about given cardiovascular hx  Plan: Iron studies, B12 and folate level, B1 and vitamin D levels obtained today. We will start vaginal vitamin D suppositories 200 units/mL, 1 mL PV to 3 times weekly.  [facility] Pt advised to call with any future vaginal bleeding.   30 minutes total time spent with patient today

## 2019-10-28 ENCOUNTER — Other Ambulatory Visit: Payer: Self-pay

## 2019-10-29 ENCOUNTER — Other Ambulatory Visit: Payer: Self-pay

## 2019-10-29 ENCOUNTER — Encounter: Payer: Self-pay | Admitting: Obstetrics & Gynecology

## 2019-10-29 ENCOUNTER — Ambulatory Visit (INDEPENDENT_AMBULATORY_CARE_PROVIDER_SITE_OTHER): Payer: Managed Care, Other (non HMO) | Admitting: Obstetrics & Gynecology

## 2019-10-29 VITALS — BP 106/64 | HR 70 | Temp 98.1°F | Resp 16 | Wt 203.0 lb

## 2019-10-29 DIAGNOSIS — N765 Ulceration of vagina: Secondary | ICD-10-CM

## 2019-10-29 DIAGNOSIS — R5383 Other fatigue: Secondary | ICD-10-CM | POA: Diagnosis not present

## 2019-10-29 MED ORDER — NONFORMULARY OR COMPOUNDED ITEM: 3 refills | Status: DC

## 2019-11-03 ENCOUNTER — Other Ambulatory Visit: Payer: Self-pay

## 2019-11-03 ENCOUNTER — Ambulatory Visit (INDEPENDENT_AMBULATORY_CARE_PROVIDER_SITE_OTHER): Payer: Managed Care, Other (non HMO) | Admitting: Cardiology

## 2019-11-03 ENCOUNTER — Encounter: Payer: Self-pay | Admitting: Cardiology

## 2019-11-03 VITALS — BP 116/82 | HR 47 | Ht 68.0 in | Wt 207.0 lb

## 2019-11-03 DIAGNOSIS — E785 Hyperlipidemia, unspecified: Secondary | ICD-10-CM

## 2019-11-03 DIAGNOSIS — R002 Palpitations: Secondary | ICD-10-CM

## 2019-11-03 DIAGNOSIS — I208 Other forms of angina pectoris: Secondary | ICD-10-CM

## 2019-11-03 DIAGNOSIS — R001 Bradycardia, unspecified: Secondary | ICD-10-CM

## 2019-11-03 DIAGNOSIS — I25119 Atherosclerotic heart disease of native coronary artery with unspecified angina pectoris: Secondary | ICD-10-CM

## 2019-11-03 DIAGNOSIS — I2089 Other forms of angina pectoris: Secondary | ICD-10-CM

## 2019-11-03 DIAGNOSIS — I10 Essential (primary) hypertension: Secondary | ICD-10-CM

## 2019-11-03 DIAGNOSIS — I2510 Atherosclerotic heart disease of native coronary artery without angina pectoris: Secondary | ICD-10-CM

## 2019-11-03 DIAGNOSIS — R5383 Other fatigue: Secondary | ICD-10-CM

## 2019-11-03 DIAGNOSIS — Z9861 Coronary angioplasty status: Secondary | ICD-10-CM

## 2019-11-03 MED ORDER — CLOPIDOGREL BISULFATE 75 MG PO TABS: 75.0000 mg | ORAL_TABLET | Freq: Every day | ORAL | 3 refills | Status: DC

## 2019-11-03 MED ORDER — DILTIAZEM HCL 30 MG PO TABS: 30.0000 mg | ORAL_TABLET | Freq: Two times a day (BID) | ORAL | 3 refills | Status: DC

## 2019-11-03 NOTE — Progress Notes (Signed)
Primary Care Provider: Leonard Downing, MD Cardiologist: [name], MD Electrophysiologist:   Clinic Note: Chief Complaint  Patient presents with  . Follow-up    Myoview results  . Coronary Artery Disease    No further angina  . Fatigue    HPI:    Carrie Reynolds is a 64 y.o. female with a PMH notable for CAD having angina with recent LAD PCI (with recent repeat PCI for proximal edge dissection-proximal overlapping stent), along with obesity (s/p gastric bypass), OSA-CPAP who presents today post cath follow-up.  Carrie Reynolds was seen on October 12, 2019 with unusual complaints of dyspnea spells and fluttering sensation in her chest may be 2 or 3 times a week.  Prior to that she been doing very well with no major symptoms then had a back turn.  She was not really doing much walking because she was concerned about her symptoms.  She was not necessarily noticing any chest pain, but was not exerting herself because of concerns.  Rechecked Myoview just to confirm no ischemia.  Converted from beta-blocker to diltiazem because of fatigue  Recent Hospitalizations: n/a  Reviewed  CV studies:    The following studies were reviewed today: (if available, images/films reviewed: From Epic Chart or Care Everywhere)  Myoview 10/23/2019: Normal EF 55 to 60%.  No EKG changes.  Small size mild severity fixed defect in the apical wall consistent with breast attenuation versus small prior infarct.  Marked improvement from anterior apical perfusion abnormality seen in February 2020.  Interval History:   Carrie Reynolds returns here today very reassured at the results of her stress test.  She is still a little concerned though because she still having fatigue albeit not as bad.  She still has these strange bubbly sensations but they are much better now maybe twice a week as opposed to every day. She and her husband have totally changed their diet where they are doing order in  delivering preplanned meals to prepare.  She is also exercising least 6 days a week now.  Thankfully, she is not having any chest discomfort anymore, and her exertional dyspnea has improved..  The fluttering sensations are also notably improved.  She is not having any PND orthopnea but is having some mild edema.    CV Review of Symptoms (Summary) Cardiovascular ROS: no chest pain or dyspnea on exertion positive for - edema, irregular heartbeat and Less frequent fluttering sensation, fatigue still present but less exertional dyspnea. negative for - orthopnea, paroxysmal nocturnal dyspnea, shortness of breath or TIA/amaurosis fugax, syncope/near syncope, claudication  The patient DOES NOT have symptoms concerning for COVID-19 infection (fever, chills, cough, or new shortness of breath).  The patient continues to practice social distancing and masking.   -> Has yet to get her Covid vaccine. ->  Stating that she is waiting to see how other people do.  By the end of our conversation, she agreed that she probably should.   REVIEWED OF SYSTEMS   A comprehensive ROS was performed. Review of Systems  Constitutional: Negative for malaise/fatigue (Has not felt like doing things because concern with fluttering dyspnea symptoms.  ).  HENT: Negative for congestion and nosebleeds.   Respiratory: Positive for shortness of breath (Per HPI).   Cardiovascular:       Per HPI  Gastrointestinal: Positive for heartburn. Negative for blood in stool and melena.  Genitourinary: Negative for frequency and hematuria.  Musculoskeletal: Positive for myalgias. Negative for falls and joint  pain.  Neurological: Positive for dizziness. Negative for focal weakness.       Symptoms associated with fluttering sensation and dyspnea  Psychiatric/Behavioral: The patient is nervous/anxious (Starting get more anxious now with these episodes.).    I have reviewed and (if needed) personally updated the patient's problem list,  medications, allergies, past medical and surgical history, social and family history.   PAST MEDICAL HISTORY   Past Medical History:  Diagnosis Date  . Anxiety   . Arnold-Chiari malformation ([place]) 2006  . CAD S/P PCI 05/06/2019   05/06/2019: prox LAD (@ SPI) PCI with Resolute Onyx DES 3.5  x 8, Residual 60% PDA, Normal LVF; 07/02/2019: relook Cath for Anterior Ischemia on Mhoview --> CATH 70% pre-stent/proximal edge dissection --> proximal overlapping DES (Resolute Onyx 3.5 x 8 - new & old stent post-dilated to 3.8 mm)  . Cervical strain    syndrome  . Cervicogenic headache   . Concussion with loss of consciousness 10/09/2017  . CTS (carpal tunnel syndrome)   . Depression   . GERD (gastroesophageal reflux disease)   . Glucose intolerance (impaired glucose tolerance)   . High coronary artery calcium score    Score of 1460.  Coronary CTA shows high-grade proximal-mid LAD stenosis (CT FFR 0.50).  Also PDA/PLA 60 to 70% stenosis (CTO FFR 0.75)  . History of kidney stones   . Hypertension   . Migraine headache    with visual aura  . Obesity   . OSA (obstructive sleep apnea)    not treated  . Postmenopausal   . Vertigo    post concussive    PAST SURGICAL HISTORY   Past Surgical History:  Procedure Laterality Date  . BUNIONECTOMY     right great toe  . CHOLECYSTECTOMY N/A 06/05/2018   Procedure: LAPAROSCOPIC CHOLECYSTECTOMY WITH POSSIBLE INTRAOPERATIVE CHOLANGIOGRAM;  Surgeon: Erroll Luna, MD;  Location: [facility];  Service: General;  Laterality: N/A;  . CORONARY STENT INTERVENTION N/A 05/12/2019   Procedure: CORONARY STENT INTERVENTION;  Surgeon: Leonie Man, MD;  Location: [facility];  Service: Cardiovascular;;; Culprit-p-mLAD 95% 950% SP1) --> Scoring Balloon PTCA -> DES PCI (Resolute DES 3.5 x 38 - 3.8 mm; 0% LAD& 55% SP1). -->  SP1 was somewhat jailed with TIMI II flow post PCI.  [name] CORONARY STENT INTERVENTION N/A 07/02/2019   Procedure: CORONARY STENT  INTERVENTION;  Surgeon: Leonie Man, MD;  Location: [facility];  Service: Cardiovascular;  Laterality: N/A;  . GASTRIC BYPASS  11/2015   [facility]  . GYNECOLOGIC CRYOSURGERY    . HAND SURGERY    . LEFT HEART CATH AND CORONARY ANGIOGRAPHY N/A 05/12/2019   Procedure: LEFT HEART CATH AND CORONARY ANGIOGRAPHY;  Surgeon: Leonie Man, MD;  Location: [facility];  Service: Cardiovascular;;; Culprit-p-mLAD 95% 950% SP1) --> DES PCI.;  RPAV 60% -small caliber vessel, did not appear flow-limiting (medical management).  EF 55 to 60%.  Normal LVEDP.  [name] LEFT HEART CATH AND CORONARY ANGIOGRAPHY N/A 07/02/2019   Procedure: LEFT HEART CATH AND CORONARY ANGIOGRAPHY;  Surgeon: Leonie Man, MD;  Location: [facility];  Service: Cardiovascular;  Laterality: N/A;  . NM MYOVIEW LTD  06/24/2019   [facility] Adam): HIGH RISK.  EF 55%.  Large size, moderate severe defect in the anterior wall with associated anterior hypokinesis.  Suspect LAD territory.  [name] NM MYOVIEW LTD  10/23/2019   Normal EF 55 to 60%.  No EKG changes.  Small size mild severity fixed  defect in the apical wall consistent with breast attenuation versus small prior infarct.  Marked improvement from anterior apical perfusion abnormality seen in February 2020.  . TUBAL LIGATION Bilateral   . VAGINAL HYSTERECTOMY  2003   with LSO and right salpingectomy; right ovary still present    Myoview June 24, 2019: HIGH RISK.  EF 55%.  Large size, moderate severe defect in the anterior wall with associated anterior hypokinesis.  Suspect LAD territory. Relook CATH-PCI 07/02/2019: proxLAD proximal edge dissection - covered with 2nd Resolute Onyx DES 3.5 x 8 (post-dilated to 3.8 mm)   MEDICATIONS/ALLERGIES   Current Meds  Medication Sig  . aspirin EC 81 MG tablet Take 1 tablet (81 mg total) by mouth daily.  . Cholecalciferol (VITAMIN D3) 125 MCG (5000 UT) TABS Take 5,000 Units by mouth 2 (two) times daily.   . Coenzyme Q10 (CO Q10) 200  MG CAPS Take 400 mg by mouth daily.   [name] DHEA 25 MG CAPS Take 25 mg by mouth daily.   [name] losartan-hydrochlorothiazide (HYZAAR) 100-12.5 MG tablet Take 0.5 tablets by mouth daily.  . Multiple Vitamins-Minerals (MULTIVITAMIN WOMEN PO) Take 1 tablet by mouth daily.  . nitroGLYCERIN (NITROSTAT) 0.4 MG SL tablet Place 1 tablet (0.4 mg total) under the tongue every 5 (five) minutes as needed.  . NONFORMULARY OR COMPOUNDED ITEM Vitamin E vaginal suppositories 200u/ml.  One pv three times weekly.  . rosuvastatin (CRESTOR) 20 MG tablet Take 1 tablet (20 mg total) by mouth daily.  . vitamin B-12 (CYANOCOBALAMIN) 1000 MCG tablet Take 1,000 mcg by mouth daily.  . [DISCONTINUED] diltiazem (CARDIZEM CD) 120 MG 24 hr capsule Take 1 capsule (120 mg total) by mouth daily.    Allergies  Allergen Reactions  . Ceclor [Cefaclor] Hives and Rash  . Tetracyclines & Related Hives  . Lipitor [Atorvastatin Calcium] Cough  . Penicillins Swelling and Other (See Comments)    Has patient had a PCN reaction causing immediate rash, facial/tongue/throat swelling, SOB or lightheadedness with hypotension: No Has patient had a PCN reaction causing severe rash involving mucus membranes or skin necrosis: No Has patient had a PCN reaction that required hospitalization: Yes Has patient had a PCN reaction occurring within the last 10 years: No If all of the above answers are "NO", then may proceed with Cephalosporin use.  [name] Amoxicillin Rash  . Ampicillin Rash  . Lisinopril Cough  . Simvastatin Cough    SOCIAL HISTORY/FAMILY HISTORY   Social History   Tobacco Use  . Smoking status: Former Smoker    Packs/day: 0.10    Years: 30.00    Pack years: 3.00    Types: Cigarettes    Quit date: 04/26/2012    Years since quitting: 7.5  . Smokeless tobacco: Never Used  Substance Use Topics  . Alcohol use: Yes    Comment: occasional  . Drug use: No   Social History   Social History Narrative   ** Merged History Encounter **        Lives with husband [name]). She travels very frequently with her job and is gone most weeks out of the month. She is active and exercises when able.    Family History family history includes Alzheimer's disease in her father and paternal grandmother; Cancer in her father and paternal grandfather; Hyperlipidemia in her sister; Hypertension in her mother and sister; Migraines in her father.   OBJCTIVE -PE, EKG, labs   Wt Readings from Last 3 Encounters:  11/03/19 207 lb (93.9 kg)  10/29/19 203 lb (92.1 kg)  10/23/19 205 lb (93 kg)    Physical Exam: BP 116/82   Pulse (!) 47   Ht 5\' 8"  (1.727 m)   Wt 207 lb (93.9 kg)   LMP 07/24/2003 (Within Months)   BMI 31.47 kg/m  Physical Exam  Constitutional: She is oriented to person, place, and time. She appears well-developed and well-nourished. No distress.  Well-groomed.  Healthy-appearing.  In great spirits.  HENT:  Head: Normocephalic and atraumatic.  Eyes: EOM are normal.  Neck: Normal carotid pulses, no hepatojugular reflux and no JVD present. Carotid bruit is not present. No tracheal deviation present.  Cardiovascular: Normal rate, regular rhythm, normal heart sounds and intact distal pulses.  Occasional extrasystoles (Rare) are present. PMI is not displaced. Exam reveals no gallop and no friction rub.  No murmur heard. Pulmonary/Chest: Effort normal and breath sounds normal. No respiratory distress. She has no wheezes. She has no rales.  Musculoskeletal:        General: No edema. Normal range of motion.     Cervical back: Normal range of motion and neck supple.  Neurological: She is alert and oriented to person, place, and time.  Psychiatric: She has a normal mood and affect. Her behavior is normal. Judgment and thought content normal.  Vitals reviewed.   Adult ECG Report Not checked  Recent Labs:    Lab Results  Component Value Date   CHOL 149 10/23/2019   HDL 52 10/23/2019   LDLCALC 66 10/23/2019   TRIG 189 (H)  10/23/2019   CHOLHDL 2.9 10/23/2019    Lab Results  Component Value Date   CREATININE 0.95 10/23/2019   BUN 21 10/23/2019   NA 142 10/23/2019   K 4.2 10/23/2019   CL 105 10/23/2019   CO2 25 10/23/2019    ASSESSMENT/PLAN    Problem List Items Addressed This Visit    CAD S/P DES PCI-proximal LAD (Chronic)    Now 2 overlapping LAD stents because of delayed presentation of proximal edge dissection leading to second PCI proximal overlap of the first stent.  Plan: Continue DAPT (aspirin and Plavix) uninterrupted until at least June.    At that point (end of June 2021,, we can stop aspirin but would continue Plavix maintenance given her proximal LAD stents.  Would prefer to continue uninterrupted Plavix for 1 year, but as of July 2021, could interrupt Plavix if necessary for procedures 507 days.      Relevant Medications   diltiazem (CARDIZEM) 30 MG tablet   Coronary artery disease involving native coronary artery of native heart with angina pectoris (HCC) - Primary (Chronic)    Thankfully, no further anginal symptoms and her follow-up Myoview shows essentially resolution of the previous anterior lateral defect seen in December prior to redo cath.  There is a small fixed apical defect which is probably either a small infarct from her initial PCI versus breast attenuation, regardless it is not reversible.  Plan: Continue off of beta-blocker because of fatigue, using diltiazem (however we will reduce dosing) along with losartan-HCTZ. Continue statin and DAPT as noted. ->  Goal would be to continue aspirin through June, then DC and continue maintenance dose Plavix      Relevant Medications   diltiazem (CARDIZEM) 30 MG tablet   Essential (primary) hypertension (Chronic)    Blood pressure looks good today on losartan-HCTZ plus diltiazem, however her heart rate is 46 and she is noting fatigue.  Plan: DC diltiazem CD 120 mg daily, and start  30 mg short acting diltiazem twice daily       Relevant Medications   diltiazem (CARDIZEM) 30 MG tablet   Hyperlipidemia with target LDL less than 70 (Chronic)    On rosuvastatin 20 mg.  Labs just checked show LDL 66.  Triglycerides are little elevated, need to monitor her p.o. intake-carbohydrate/sugars and fats.  For now simply continue rosuvastatin at current dose, low threshold to consider fish oil.      Relevant Medications   diltiazem (CARDIZEM) 30 MG tablet   Rapid palpitations (Chronic)    She was doing fine with palpitations standpoint on the diltiazem and beta-blocker, but having fatigue we are still cutting down the dose.  She can use additional doses of 30 mg as needed      Sinus bradycardia (Chronic)    Little unusual with low-dose diltiazem.  This may go along with her fatigue.  Plan: DC long-acting diltiazem 120 mg and start at 30 mg twice daily.  If she does not tolerate this dose, would simply need to stop.   Unfortunately she is having palpitations that have been well controlled with diltiazem.      Relevant Medications   diltiazem (CARDIZEM) 30 MG tablet   Atypical angina ([place])    She did have unusual symptoms leading up to her initial cath and PCI, but it did sound more classic then this most recent episode.  Thankfully, Myoview was nonischemic.      Relevant Medications   diltiazem (CARDIZEM) 30 MG tablet   Fatigue    Probably multifactorial.  For now will be to cut back her diltiazem dose essentially in half to 30 mg twice daily short acting as opposed 120 mg diltiazem CD.  2 weeks out of this transition, if she is still having fatigue, then I would like for her to do a 1 month statin holiday just to ensure the statin is not leading to the symptoms.  As such we will see her back in 6 to 8 weeks.         COVID-19 Education: The signs and symptoms of COVID-19 were discussed with the patient and how to seek care for testing (follow up with PCP or arrange E-visit).   The importance of social  distancing and proceeding with getting COVID-19 vaccine was discussed today.  I spent a total of 20 minutes with the patient and chart review. >  50% of the time was spent in direct patient consultation.  Additional time spent with chart review (studies, outside notes, etc): 8 Total Time: 28 min  Current medicines are reviewed at length with the patient today.  (+/- concerns) none   Patient Instructions / Medication Changes & Studies & Tests Ordered   Patient Instructions  Medication Instructions:  Restart clopidogrel 75 mg one tablet daily -- the first day take (4 tablets total 300 mg ), then go to 75 mg .    Stop taking Diltiazem CD 120 mg  Start  Taking diltiazem  30 mg  One tablet twice a day .  after taking it for 2 weeks - if you are still fatigue- take statin Holiday - meaning do not take Rosuvastatin  For 1 month  To see if fatigue gets better. Continue with Diltiazem     *If you need a refill on your cardiac medications before your next appointment, please call your pharmacy*   Lab Work: Not needed   Testing/Procedures: Not needed   Follow-Up: At [facility], you and your health needs  are our priority.  As part of our continuing mission to provide you with exceptional heart care, we have created designated Provider Care Teams.  These Care Teams include your primary Cardiologist (physician) and Advanced Practice Providers (APPs -  Physician Assistants and Nurse Practitioners) who all work together to provide you with the care you need, when you need it.    Your next appointment:   6 to 8  week(s)  The format for your next appointment:   Virtual Visit   Provider:   [name], MD   Other Instructions n/a   Studies Ordered:   No orders of the defined types were placed in this encounter.    [name], M.D., M.S. Interventional Cardiologist   Pager # [phone] Phone # [phone] [address] [place], [place]  [address]   Thank you for choosing Heartcare at [facility]!!

## 2019-11-03 NOTE — Patient Instructions (Addendum)
Medication Instructions:  Restart clopidogrel 75 mg one tablet daily -- the first day take (4 tablets total 300 mg ), then go to 75 mg .    Stop taking Diltiazem CD 120 mg  Start  Taking diltiazem  30 mg  One tablet twice a day .  after taking it for 2 weeks - if you are still fatigue- take statin Holiday - meaning do not take Rosuvastatin  For 1 month  To see if fatigue gets better. Continue with Diltiazem     *If you need a refill on your cardiac medications before your next appointment, please call your pharmacy*   Lab Work: Not needed   Testing/Procedures: Not needed   Follow-Up: At [facility] [place], you and your health needs are our priority.  As part of our continuing mission to provide you with exceptional heart care, we have created designated Provider Care Teams.  These Care Teams include your primary Cardiologist (physician) and Advanced Practice Providers (APPs -  Physician Assistants and Nurse Practitioners) who all work together to provide you with the care you need, when you need it.    Your next appointment:   6 to 8  week(s)  The format for your next appointment:   Virtual Visit   Provider:   [name], MD   Other Instructions n/a

## 2019-11-07 LAB — IRON,TIBC AND FERRITIN PANEL
Ferritin: 18 ng/mL (ref 15–150)
Iron Saturation: 16 % (ref 15–55)
Iron: 56 ug/dL (ref 27–139)
Total Iron Binding Capacity: 341 ug/dL (ref 250–450)
UIBC: 285 ug/dL (ref 118–369)

## 2019-11-07 LAB — VITAMIN D 25 HYDROXY (VIT D DEFICIENCY, FRACTURES): Vit D, 25-Hydroxy: 33.6 ng/mL (ref 30.0–100.0)

## 2019-11-07 LAB — VITAMIN B1: Thiamine: 103.4 nmol/L (ref 66.5–200.0)

## 2019-11-07 LAB — B12 AND FOLATE PANEL
Folate: 10.5 ng/mL (ref >3.0)
Vitamin B-12: 443 pg/mL (ref 232–1245)

## 2019-11-08 ENCOUNTER — Encounter: Payer: Self-pay | Admitting: Cardiology

## 2019-11-08 NOTE — Assessment & Plan Note (Signed)
Thankfully, no further anginal symptoms and her follow-up Myoview shows essentially resolution of the previous anterior lateral defect seen in December prior to redo cath.  There is a small fixed apical defect which is probably either a small infarct from her initial PCI versus breast attenuation, regardless it is not reversible.  Plan: Continue off of beta-blocker because of fatigue, using diltiazem (however we will reduce dosing) along with losartan-HCTZ. Continue statin and DAPT as noted. ->  Goal would be to continue aspirin through June, then DC and continue maintenance dose Plavix

## 2019-11-08 NOTE — Assessment & Plan Note (Signed)
Now 2 overlapping LAD stents because of delayed presentation of proximal edge dissection leading to second PCI proximal overlap of the first stent.  Plan: Continue DAPT (aspirin and Plavix) uninterrupted until at least June.    At that point (end of June 2021,, we can stop aspirin but would continue Plavix maintenance given her proximal LAD stents.  Would prefer to continue uninterrupted Plavix for 1 year, but as of July 2021, could interrupt Plavix if necessary for procedures 507 days.

## 2019-11-08 NOTE — Assessment & Plan Note (Signed)
She did have unusual symptoms leading up to her initial cath and PCI, but it did sound more classic then this most recent episode.  Thankfully, Myoview was nonischemic.

## 2019-11-08 NOTE — Assessment & Plan Note (Signed)
Little unusual with low-dose diltiazem.  This may go along with her fatigue.  Plan: DC long-acting diltiazem 120 mg and start at 30 mg twice daily.  If she does not tolerate this dose, would simply need to stop.   Unfortunately she is having palpitations that have been well controlled with diltiazem.

## 2019-11-08 NOTE — Assessment & Plan Note (Signed)
Blood pressure looks good today on losartan-HCTZ plus diltiazem, however her heart rate is 46 and she is noting fatigue.  Plan: DC diltiazem CD 120 mg daily, and start 30 mg short acting diltiazem twice daily

## 2019-11-08 NOTE — Assessment & Plan Note (Signed)
She was doing fine with palpitations standpoint on the diltiazem and beta-blocker, but having fatigue we are still cutting down the dose.  She can use additional doses of 30 mg as needed

## 2019-11-08 NOTE — Assessment & Plan Note (Signed)
Probably multifactorial.  For now will be to cut back her diltiazem dose essentially in half to 30 mg twice daily short acting as opposed 120 mg diltiazem CD.  2 weeks out of this transition, if she is still having fatigue, then I would like for her to do a 1 month statin holiday just to ensure the statin is not leading to the symptoms.  As such we will see her back in 6 to 8 weeks.

## 2019-11-08 NOTE — Assessment & Plan Note (Signed)
On rosuvastatin 20 mg.  Labs just checked show LDL 66.  Triglycerides are little elevated, need to monitor her p.o. intake-carbohydrate/sugars and fats.  For now simply continue rosuvastatin at current dose, low threshold to consider fish oil.

## 2019-11-22 ENCOUNTER — Encounter: Payer: Self-pay | Admitting: Obstetrics & Gynecology

## 2019-11-27 ENCOUNTER — Ambulatory Visit: Payer: Self-pay

## 2019-11-27 ENCOUNTER — Ambulatory Visit (INDEPENDENT_AMBULATORY_CARE_PROVIDER_SITE_OTHER): Payer: Managed Care, Other (non HMO) | Admitting: Surgical

## 2019-11-27 ENCOUNTER — Other Ambulatory Visit: Payer: Self-pay

## 2019-11-27 DIAGNOSIS — M545 Low back pain, unspecified: Secondary | ICD-10-CM

## 2019-11-27 DIAGNOSIS — M25532 Pain in left wrist: Secondary | ICD-10-CM | POA: Diagnosis not present

## 2019-11-27 DIAGNOSIS — M5410 Radiculopathy, site unspecified: Secondary | ICD-10-CM

## 2019-11-27 MED ORDER — DICLOFENAC SODIUM 50 MG PO TBEC: 50.0000 mg | DELAYED_RELEASE_TABLET | Freq: Two times a day (BID) | ORAL | 0 refills | Status: DC

## 2019-11-27 MED ORDER — GABAPENTIN 100 MG PO CAPS: 100.0000 mg | ORAL_CAPSULE | Freq: Three times a day (TID) | ORAL | 0 refills | Status: DC

## 2019-11-28 ENCOUNTER — Encounter: Payer: Self-pay | Admitting: Surgical

## 2019-11-28 NOTE — Progress Notes (Signed)
Office Visit Note   Patient: Carrie Reynolds           Date of Birth: Mar 30, 1956           MRN: [id number] Visit Date: 11/27/2019 Requested by: Leonard Downing, MD [place],  [place] [id number] PCP: Leonard Downing, MD  Subjective: Chief Complaint  Patient presents with  . Left Wrist - Pain    HPI: Carrie Reynolds is a 64 y.o. female who presents to the office complaining of left wrist pain.  Patient injured her left wrist 2 days ago when she grabbed onto a stair banister trying to keep from falling and caused her wrist to twist.  She denies any swelling or bruising in the 2 days since the incident.  She is right-hand dominant.  She localizes pain to the ulnar aspect of the left wrist.  She has no previous wrist injury.  Pain is worse with lifting and twisting her left wrist.  Denies any numbness or tingling.  Pain does wake her up at night and she has been taking ibuprofen 800 mg for pain relief.  Has history of right carpal tunnel release.  Patient also complains of back pain with radicular pain down her bilateral legs.  She had previous injection by Dr. Ernestina Patches that provided excellent relief for 6 weeks.  She denies any subjective weakness or red flag symptoms from her back..                ROS:  All systems reviewed are negative as they relate to the chief complaint within the history of present illness.  Patient denies fevers or chills.  Assessment & Plan: Visit Diagnoses:  1. Pain in left wrist   2. Low back pain, unspecified back pain laterality, unspecified chronicity, unspecified whether sciatica present   3. Radicular leg pain     Plan: Patient is a 64 year old female presents complaining of left wrist pain following a twisting injury 2 days ago.  She localizes the majority of her pain to the ulnar aspect of the left wrist.  She has no significant pain with wrist range of motion aside from ulnar deviation.  She does have some tenderness to  palpation in the ulnar fovea of the wrist.  Radiographs of the left wrist show no fracture or significant degenerative changes that could explain her pain.  Plan for patient to use over-the-counter Voltaren gel to help with her pain.  Due to her history of myocardial infarction, recommended that she discontinue using oral ibuprofen.  Cancel prescription for oral Voltaren as well.  Additionally, patient complains of continued back pain with radicular symptoms down both legs.  She has history of lumbar spine ESI by Dr. Ernestina Patches that provided great relief for 6 weeks.  Refer patient back over to Dr. Ernestina Patches and prescribed gabapentin for use in the meantime.  Patient agreed with this plan.  Follow-up in 4 weeks for clinical recheck regarding the left wrist.  If she is no better at that time or if there is significant worsening of pain, will consider MRI scan at that time.  Follow-Up Instructions: No follow-ups on file.   Orders:  Orders Placed This Encounter  Procedures  . XR Wrist Complete Left   Meds ordered this encounter  Medications  . diclofenac (VOLTAREN) 50 MG EC tablet    Sig: Take 1 tablet (50 mg total) by mouth 2 (two) times daily.    Dispense:  60 tablet  Refill:  0  . gabapentin (NEURONTIN) 100 MG capsule    Sig: Take 1 capsule (100 mg total) by mouth 3 (three) times daily.    Dispense:  90 capsule    Refill:  0      Procedures: No procedures performed   Clinical Data: No additional findings.  Objective: Vital Signs: LMP 07/24/2003 (Within Months)   Physical Exam:  Constitutional: Patient appears well-developed HEENT:  Head: Normocephalic Eyes:EOM are normal Neck: Normal range of motion Cardiovascular: Normal rate Pulmonary/chest: Effort normal Neurologic: Patient is alert Skin: Skin is warm Psychiatric: Patient has normal mood and affect  Ortho Exam:  Tenderness to palpation throughout the axial lumbar spine, worse around L5.  Paraspinal musculature tenderness  present as well.  5/5 motor strength of the bilateral hip flexors, quadriceps, hamstring, dorsiflexion, plantarflexion.  Sensation intact through all dermatomes of bilateral lower extremities.  No evidence of hypo or hyperreflexia.  No significant pain elicited with left wrist passive extension, flexion, radial deviation.  Pain worse with ulnar deviation.  No tenderness to palpation over the radial aspect of the left wrist/hand.  Tenderness to palpation over the ulnar fovea of the left wrist.  No laxity with stressing of the DRUJ.  Motor function of the hand intact.  2+ radial pulse.  Specialty Comments:  No specialty comments available.  Imaging: No results found.   PMFS History: Patient Active Problem List   Diagnosis Date Noted  . Sinus bradycardia 11/03/2019  . Atypical angina ([place]) 10/12/2019  . Fatigue 10/12/2019  . Abnormal nuclear cardiac imaging test 07/02/2019  . Presence of 2 overlapping Drug Coated Stents in LAD coronary artery   . Coronary artery disease involving native coronary artery of native heart with angina pectoris ([place]) 06/17/2019  . DOE (dyspnea on exertion) 06/16/2019  . CAD S/P DES PCI-proximal LAD 05/20/2019  . Abnormal cardiac CT angiography 05/12/2019  . Agatston coronary artery calcium score greater than 400 04/16/2019  . Migraine with vertigo 07/29/2018  . Rapid palpitations 04/16/2017  . Systolic murmur [id number]  . 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  . OSA on CPAP 11/19/2013  . Hyperlipidemia with target LDL less than 70 07/29/2013  . Obesity (BMI 30.0-34.9) 04/25/2012  . Depression with anxiety 04/25/2012  . Environmental allergies 04/25/2012  . Essential (primary) hypertension 04/25/2012  . Chiari malformation type I ([place]) 01/03/2012  . Postmenopausal 09/18/2011   Past Medical History:  Diagnosis Date  . Anxiety   . Arnold-Chiari malformation ([place]) 2006  . CAD S/P PCI 05/06/2019   05/06/2019: prox LAD (@ SPI) PCI with Resolute Onyx DES 3.5  x 8,  Residual 60% PDA, Normal LVF; 07/02/2019: relook Cath for Anterior Ischemia on [place] --> CATH 70% pre-stent/proximal edge dissection --> proximal overlapping DES (Resolute Onyx 3.5 x 8 - new & old stent post-dilated to 3.8 mm)  . Cervical strain    syndrome  . Cervicogenic headache   . Concussion with loss of consciousness 10/09/2017  . CTS (carpal tunnel syndrome)   . Depression   . GERD (gastroesophageal reflux disease)   . Glucose intolerance (impaired glucose tolerance)   . High coronary artery calcium score    Score of 1460.  Coronary CTA shows high-grade proximal-mid LAD stenosis (CT FFR 0.50).  Also PDA/PLA 60 to 70% stenosis (CTO FFR 0.75)  . History of kidney stones   . Hypertension   . Migraine headache    with visual aura  . Obesity   . OSA (obstructive sleep apnea)  not treated  . Postmenopausal   . Vertigo    post concussive    Family History  Problem Relation Age of Onset  . Hypertension Mother   . Cancer Father        lung  . Alzheimer's disease Father   . Migraines Father   . Hyperlipidemia Sister   . Hypertension Sister   . Alzheimer's disease Paternal Grandmother   . Cancer Paternal Grandfather        lung    Past Surgical History:  Procedure Laterality Date  . BUNIONECTOMY     right great toe  . CHOLECYSTECTOMY N/A 06/05/2018   Procedure: LAPAROSCOPIC CHOLECYSTECTOMY WITH POSSIBLE INTRAOPERATIVE CHOLANGIOGRAM;  Surgeon: Erroll Luna, MD;  Location: [facility];  Service: General;  Laterality: N/A;  . CORONARY STENT INTERVENTION N/A 05/12/2019   Procedure: CORONARY STENT INTERVENTION;  Surgeon: Leonie Man, MD;  Location: [facility];  Service: Cardiovascular;;; Culprit-p-mLAD 95% 950% SP1) --> Scoring Balloon PTCA -> DES PCI (Resolute DES 3.5 x 38 - 3.8 mm; 0% LAD& 55% SP1). -->  SP1 was somewhat jailed with TIMI II flow post PCI.  [name] CORONARY STENT INTERVENTION N/A 07/02/2019   Procedure: CORONARY STENT INTERVENTION;  Surgeon: Leonie Man, MD;  Location: [facility];  Service: Cardiovascular;  Laterality: N/A;  . GASTRIC BYPASS  11/2015   [facility]  . GYNECOLOGIC CRYOSURGERY    . HAND SURGERY    . LEFT HEART CATH AND CORONARY ANGIOGRAPHY N/A 05/12/2019   Procedure: LEFT HEART CATH AND CORONARY ANGIOGRAPHY;  Surgeon: Leonie Man, MD;  Location: [facility];  Service: Cardiovascular;;; Culprit-p-mLAD 95% 950% SP1) --> DES PCI.;  RPAV 60% -small caliber vessel, did not appear flow-limiting (medical management).  EF 55 to 60%.  Normal LVEDP.  [name] LEFT HEART CATH AND CORONARY ANGIOGRAPHY N/A 07/02/2019   Procedure: LEFT HEART CATH AND CORONARY ANGIOGRAPHY;  Surgeon: Leonie Man, MD;  Location: [facility];  Service: Cardiovascular;  Laterality: N/A;  . NM MYOVIEW LTD  06/24/2019   Carlton Adam): HIGH RISK.  EF 55%.  Large size, moderate severe defect in the anterior wall with associated anterior hypokinesis.  Suspect LAD territory.  [name] NM MYOVIEW LTD  10/23/2019   Normal EF 55 to 60%.  No EKG changes.  Small size mild severity fixed defect in the apical wall consistent with breast attenuation versus small prior infarct.  Marked improvement from anterior apical perfusion abnormality seen in February 2020.  . TUBAL LIGATION Bilateral   . VAGINAL HYSTERECTOMY  2003   with LSO and right salpingectomy; right ovary still present   Social History   Occupational History  . Occupation: Retired Best boy: [employer]    Comment: Logistics, warehouse  Tobacco Use  . Smoking status: Former Smoker    Packs/day: 0.10    Years: 30.00    Pack years: 3.00    Types: Cigarettes    Quit date: 04/26/2012    Years since quitting: 7.5  . Smokeless tobacco: Never Used  Substance and Sexual Activity  . Alcohol use: Yes    Comment: occasional  . Drug use: No  . Sexual activity: Yes    Partners: Male    Birth control/protection: Surgical    Comment: hysterectomy

## 2019-12-17 ENCOUNTER — Telehealth: Payer: Managed Care, Other (non HMO) | Admitting: Cardiology

## 2019-12-22 ENCOUNTER — Encounter: Payer: Self-pay | Admitting: Physical Medicine and Rehabilitation

## 2019-12-24 ENCOUNTER — Other Ambulatory Visit: Payer: Self-pay | Admitting: Surgical

## 2019-12-24 NOTE — Telephone Encounter (Signed)
Pls advise.  

## 2019-12-25 ENCOUNTER — Telehealth: Payer: Self-pay | Admitting: *Deleted

## 2019-12-25 NOTE — Telephone Encounter (Signed)
Submitted more clinical to Vivian.

## 2019-12-31 ENCOUNTER — Other Ambulatory Visit: Payer: Self-pay | Admitting: Surgical

## 2019-12-31 ENCOUNTER — Telehealth: Payer: Self-pay | Admitting: Orthopedic Surgery

## 2019-12-31 MED ORDER — IBUPROFEN 800 MG PO TABS: 800.0000 mg | ORAL_TABLET | Freq: Three times a day (TID) | ORAL | 0 refills | Status: DC | PRN

## 2019-12-31 NOTE — Telephone Encounter (Signed)
Submitted RX for ibuprofen 800mg 

## 2019-12-31 NOTE — Telephone Encounter (Signed)
Pls advise.  

## 2019-12-31 NOTE — Telephone Encounter (Signed)
Patient's insurance denied coverage for injection. Dr. Ernestina Patches is going to see her on 6/23 fro an OV to try to get this approved. Patient's requests something for pain until she can have injection. She says that Dr. Marlou Sa usually prescribed Ibuprofen 800 mg and this is fine with her.

## 2020-01-01 NOTE — Telephone Encounter (Signed)
S/w pt and advised done.

## 2020-01-13 ENCOUNTER — Encounter: Payer: Managed Care, Other (non HMO) | Admitting: Physical Medicine and Rehabilitation

## 2020-01-13 ENCOUNTER — Ambulatory Visit: Payer: Managed Care, Other (non HMO) | Admitting: Physical Medicine and Rehabilitation

## 2020-01-22 ENCOUNTER — Telehealth (INDEPENDENT_AMBULATORY_CARE_PROVIDER_SITE_OTHER): Payer: Managed Care, Other (non HMO) | Admitting: Cardiology

## 2020-01-22 ENCOUNTER — Telehealth: Payer: Self-pay | Admitting: *Deleted

## 2020-01-22 ENCOUNTER — Encounter: Payer: Self-pay | Admitting: Cardiology

## 2020-01-22 VITALS — Ht 67.0 in | Wt 201.0 lb

## 2020-01-22 DIAGNOSIS — E785 Hyperlipidemia, unspecified: Secondary | ICD-10-CM

## 2020-01-22 DIAGNOSIS — E669 Obesity, unspecified: Secondary | ICD-10-CM | POA: Diagnosis not present

## 2020-01-22 DIAGNOSIS — I2510 Atherosclerotic heart disease of native coronary artery without angina pectoris: Secondary | ICD-10-CM

## 2020-01-22 DIAGNOSIS — I25119 Atherosclerotic heart disease of native coronary artery with unspecified angina pectoris: Secondary | ICD-10-CM

## 2020-01-22 DIAGNOSIS — Z9861 Coronary angioplasty status: Secondary | ICD-10-CM

## 2020-01-22 DIAGNOSIS — I10 Essential (primary) hypertension: Secondary | ICD-10-CM

## 2020-01-22 DIAGNOSIS — R002 Palpitations: Secondary | ICD-10-CM

## 2020-01-22 DIAGNOSIS — R001 Bradycardia, unspecified: Secondary | ICD-10-CM

## 2020-01-22 NOTE — Telephone Encounter (Signed)
  Patient Consent for Virtual Visit         Carrie Reynolds has provided verbal consent on 01/22/2020 for a virtual visit (video or telephone).   CONSENT FOR VIRTUAL VISIT FOR:  Carrie Reynolds  By participating in this virtual visit I agree to the following:  I hereby voluntarily request, consent and authorize [facility] and its employed or contracted physicians, physician assistants, nurse practitioners or other licensed health care professionals (the Practitioner), to provide me with telemedicine health care services (the "Services") as deemed necessary by the treating Practitioner. I acknowledge and consent to receive the Services by the Practitioner via telemedicine. I understand that the telemedicine visit will involve communicating with the Practitioner through live audiovisual communication technology and the disclosure of certain medical information by electronic transmission. I acknowledge that I have been given the opportunity to request an in-person assessment or other available alternative prior to the telemedicine visit and am voluntarily participating in the telemedicine visit.  I understand that I have the right to withhold or withdraw my consent to the use of telemedicine in the course of my care at any time, without affecting my right to future care or treatment, and that the Practitioner or I may terminate the telemedicine visit at any time. I understand that I have the right to inspect all information obtained and/or recorded in the course of the telemedicine visit and may receive copies of available information for a reasonable fee.  I understand that some of the potential risks of receiving the Services via telemedicine include:  [name] Delay or interruption in medical evaluation due to technological equipment failure or disruption; . Information transmitted may not be sufficient (e.g. poor resolution of images) to allow for appropriate medical decision making by the  Practitioner; and/or  . In rare instances, security protocols could fail, causing a breach of personal health information.  Furthermore, I acknowledge that it is my responsibility to provide information about my medical history, conditions and care that is complete and accurate to the best of my ability. I acknowledge that Practitioner's advice, recommendations, and/or decision may be based on factors not within their control, such as incomplete or inaccurate data provided by me or distortions of diagnostic images or specimens that may result from electronic transmissions. I understand that the practice of medicine is not an exact science and that Practitioner makes no warranties or guarantees regarding treatment outcomes. I acknowledge that a copy of this consent can be made available to me via my patient portal ([facility]), or I can request a printed copy by calling the office of [facility].    I understand that my insurance will be billed for this visit.   I have read or had this consent read to me. . I understand the contents of this consent, which adequately explains the benefits and risks of the Services being provided via telemedicine.  . I have been provided ample opportunity to ask questions regarding this consent and the Services and have had my questions answered to my satisfaction. . I give my informed consent for the services to be provided through the use of telemedicine in my medical care

## 2020-01-22 NOTE — Assessment & Plan Note (Signed)
Doing better since we stopped the diltiazem.  Has not required as needed short acting diltiazem for palpitations.  Relatively well controlled now with Bystolic

## 2020-01-22 NOTE — Assessment & Plan Note (Signed)
2 overlapping DES stents in the LAD-initial PCI found to have proximal edge dissection on follow-up.  She is doing remarkably well with no recurrent anginal symptoms.  Actually she is back to her baseline.   Plan was to continue uninterrupted DAPT with aspirin and Plavix until June of this year.  At this point, it would be okay for her to interrupt aspirin Plavix for any urgent or semielective procedures or surgeries.  Would prefer to go 1 complete year from the last PCI in December 2020 for more elective procedures.  At this point, would be okay to hold Plavix 5 to 7 days preop for urgent or semielective procedures/surgeries.

## 2020-01-22 NOTE — Assessment & Plan Note (Signed)
She keeps doing well with weight loss.  Diet and exercise have been working.  She still wants to lose more.  I congratulated her efforts.  This brings up the topic of her planned "tummy tuck" and excess skin removal.  If possible be nice to wait a full year out from her LAD PCI, but if necessary, this can be done at any point.  She is probably okay holding Plavix for surgery.

## 2020-01-22 NOTE — Assessment & Plan Note (Signed)
Doing well now.  No further angina or exertional dyspnea.  Myoview no longer shows any ischemia.  There is a small fixed apical defect which could either be breast attenuation or mild residual apical infarct from her initial PCI.  Plan: She is on statin and beta-blocker along with DAPT (okay to hold or stop aspirin, at this point)  She asked about estradiol supplementation, I think as long as the plan is just to help with symptoms of hot flushes during menopause, rest probably acceptable, but would not continue long-term once the symptoms have improved.  Vaginal topical treatment is probably not can cause any problems.

## 2020-01-22 NOTE — Assessment & Plan Note (Signed)
Doing fairly well.  No longer requiring any additional therapy beyond Bystolic.  Tolerating coming off of the diltiazem.

## 2020-01-22 NOTE — Progress Notes (Signed)
Virtual Visit via Telephone Note   This visit type was conducted due to national recommendations for restrictions regarding the COVID-19 Pandemic (e.g. social distancing) in an effort to limit this patient's exposure and mitigate transmission in our community.  Due to her co-morbid illnesses, this patient is at least at moderate risk for complications without adequate follow up.  This format is felt to be most appropriate for this patient at this time.  The patient did not have access to video technology/had technical difficulties with video requiring transitioning to audio format only (telephone).  All issues noted in this document were discussed and addressed.  No physical exam could be performed with this format.  Please refer to the patient's chart for her  consent to telehealth for [facility].   Patient has given verbal permission to conduct this visit via virtual appointment and to bill insurance 01/22/2020 1:28 PM     Evaluation Performed:  Follow-up visit  Date:  01/22/2020   ID:  Carrie Reynolds, DOB 1955/08/23, MRN [id number]  Patient Location: Home Provider Location: Office  PCP:  Leonard Downing, MD  Cardiologist:  [name], MD  Electrophysiologist:  None   Chief Complaint:   Chief Complaint  Patient presents with  . Follow-up    Doing well.  . Coronary Artery Disease    Status post overlapping DES to proximal LAD; no angina    History of Present Illness:    Analycia Reynolds is a 64 y.o. female with PMH notable for single-vessel CAD with severe LAD lesion initially treated with a stent requiring subsequent staged PCI of the leg proximal edge dissection (initial cath October 2020 with staged relook and PCI in December 2020) who presents via audio/video conferencing for a telehealth visit today as a 103-month "checkin" follow-up because of unusual/atypical symptoms.Carrie Reynolds was last seen on November 03, 2019 for follow-up after her Myoview  demonstrating marked improvement of the anteroapical perfusion abnormality seen prior to her staged PCI in December 2020.  She was noticing strange episodes of dyspnea and fluttering in her chest.,  Bubbling sensation there were symptoms she had noted prior to the significant anginal symptoms related to her initial PCI and then staged PCI.  She indicated that she and her husband have totally changed her diet and have increased her exercise level.  She is still working on weight loss.  She was not having any more chest pain but was still having the bubbling sensation in her chest.  Exertional dyspnea notably improved.  Hospitalizations:  . n/a  Recent - Interim CV studies:    The following studies were reviewed today: . n/a:  Inerval History   Doing well. = BP & HR dong well.  No active cardiac symptoms.  No further chest discomfort or "bubbly sensations in her chest ".  No no more the fluttering sensations in her chest.. Has not had any rapid HR spells - never filled 30 mg Diltiazem.  She is still working on her diet and try to increase her exercise.  She is lost some weight, still hoping to lose more.  Cardiovascular ROS: no chest pain or dyspnea on exertion positive for - Minimal swelling and occasional skipped beats negative for - orthopnea, palpitations, paroxysmal nocturnal dyspnea, rapid heart rate, shortness of breath or Syncope/near syncope, TIA/amaurosis fugax, lightheadedness, dizziness or wooziness.  ROS:  Please see the history of present illness.    The patient does not have symptoms concerning for COVID-19 infection (  fever, chills, cough, or new shortness of breath).  Review of Systems  Constitutional: Negative for malaise/fatigue and weight loss.  Gastrointestinal: Negative for blood in stool and melena.  Genitourinary: Negative for hematuria.       Hot flashes. - estradiol  Musculoskeletal: Negative for falls and joint pain.  Neurological: Negative for speech change and  focal weakness.  Psychiatric/Behavioral: Negative for depression and memory loss. The patient is not nervous/anxious.     The patient is practicing social distancing.  J&J Vaccine - Late March -> shortly after our last visit  Past Medical History:  Diagnosis Date  . Anxiety   . Arnold-Chiari malformation ([place]) 2006  . CAD S/P PCI 05/06/2019   05/06/2019: prox LAD (@ SPI) PCI with Resolute Onyx DES 3.5  x 8, Residual 60% PDA, Normal LVF; 07/02/2019: relook Cath for Anterior Ischemia on Mhoview --> CATH 70% pre-stent/proximal edge dissection --> proximal overlapping DES (Resolute Onyx 3.5 x 8 - new & old stent post-dilated to 3.8 mm)  . Cervical strain    syndrome  . Cervicogenic headache   . Concussion with loss of consciousness 10/09/2017  . CTS (carpal tunnel syndrome)   . Depression   . GERD (gastroesophageal reflux disease)   . Glucose intolerance (impaired glucose tolerance)   . High coronary artery calcium score    Score of 1460.  Coronary CTA shows high-grade proximal-mid LAD stenosis (CT FFR 0.50).  Also PDA/PLA 60 to 70% stenosis (CTO FFR 0.75)  . History of kidney stones   . Hypertension   . Migraine headache    with visual aura  . Obesity   . OSA (obstructive sleep apnea)    not treated  . Postmenopausal   . Vertigo    post concussive   Past Surgical History:  Procedure Laterality Date  . BUNIONECTOMY     right great toe  . CHOLECYSTECTOMY N/A 06/05/2018   Procedure: LAPAROSCOPIC CHOLECYSTECTOMY WITH POSSIBLE INTRAOPERATIVE CHOLANGIOGRAM;  Surgeon: Erroll Luna, MD;  Location: [facility];  Service: General;  Laterality: N/A;  . CORONARY STENT INTERVENTION N/A 05/12/2019   Procedure: CORONARY STENT INTERVENTION;  Surgeon: Leonie Man, MD;  Location: [place] [facility];  Service: Cardiovascular;;; Culprit-p-mLAD 95% 950% SP1) --> Scoring Balloon PTCA -> DES PCI (Resolute DES 3.5 x 38 - 3.8 mm; 0% LAD& 55% SP1). -->  SP1 was somewhat jailed with TIMI II flow post  PCI.  [name] CORONARY STENT INTERVENTION N/A 07/02/2019   Procedure: CORONARY STENT INTERVENTION;  Surgeon: Leonie Man, MD;  Location: [place] [facility];  Service: Cardiovascular;  Laterality: N/A;  . GASTRIC BYPASS  11/2015   [facility]  . GYNECOLOGIC CRYOSURGERY    . HAND SURGERY    . LEFT HEART CATH AND CORONARY ANGIOGRAPHY N/A 05/12/2019   Procedure: LEFT HEART CATH AND CORONARY ANGIOGRAPHY;  Surgeon: Leonie Man, MD;  Location: [place] [facility];  Service: Cardiovascular;;; Culprit-p-mLAD 95% 950% SP1) --> DES PCI.;  RPAV 60% -small caliber vessel, did not appear flow-limiting (medical management).  EF 55 to 60%.  Normal LVEDP.  [name] LEFT HEART CATH AND CORONARY ANGIOGRAPHY N/A 07/02/2019   Procedure: LEFT HEART CATH AND CORONARY ANGIOGRAPHY;  Surgeon: Leonie Man, MD;  Location: [place] [facility];  Service: Cardiovascular;  Laterality: N/A;  . NM MYOVIEW LTD  06/24/2019   Carlton Adam): HIGH RISK.  EF 55%.  Large size, moderate severe defect in the anterior wall with associated anterior hypokinesis.  Suspect LAD territory.  [name] NM  MYOVIEW LTD  10/23/2019   Normal EF 55 to 60%.  No EKG changes.  Small size mild severity fixed defect in the apical wall consistent with breast attenuation versus small prior infarct.  Marked improvement from anterior apical perfusion abnormality seen in February 2020.  . TUBAL LIGATION Bilateral   . VAGINAL HYSTERECTOMY  2003   with LSO and right salpingectomy; right ovary still present     Relook CATH-PCI 07/02/2019: proxLAD proximal edge dissection - covered with 2nd Resolute Onyx DES 3.5 x 8 (post-dilated to 3.8 mm)     MEDICATIONS/ALLERGIES    Current Meds  Medication Sig  . aspirin EC 81 MG tablet Take 1 tablet (81 mg total) by mouth daily.  . Cholecalciferol (VITAMIN D3) 125 MCG (5000 UT) TABS Take 5,000 Units by mouth every morning.   . clopidogrel (PLAVIX) 75 MG tablet Take 1 tablet (75 mg total) by mouth daily.  [name] DHEA 25 MG CAPS Take 25 mg by  mouth daily.   [name] estradiol (CLIMARA - DOSED IN MG/24 HR) 0.025 mg/24hr patch Place 0.025 mg onto the skin once a week.  . Estradiol 10 MCG INST Place vaginally. Take 2 to 3 times a week  . losartan-hydrochlorothiazide (HYZAAR) 100-12.5 MG tablet Take 0.5 tablets by mouth daily.  . Multiple Vitamins-Minerals (MULTIVITAMIN WOMEN PO) Take 1 tablet by mouth daily.  . nebivolol (BYSTOLIC) 2.5 MG tablet Take 2.5 mg by mouth daily.  . nitroGLYCERIN (NITROSTAT) 0.4 MG SL tablet Place 1 tablet (0.4 mg total) under the tongue every 5 (five) minutes as needed.  . rosuvastatin (CRESTOR) 20 MG tablet Take 1 tablet (20 mg total) by mouth daily.  . sertraline (ZOLOFT) 50 MG tablet Take 50 mg by mouth daily.    Allergies:   Ceclor [cefaclor], Tetracyclines & related, Lipitor [atorvastatin calcium], Penicillins, Amoxicillin, Ampicillin, Lisinopril, and Simvastatin   Social History   Tobacco Use  . Smoking status: Former Smoker    Packs/day: 0.10    Years: 30.00    Pack years: 3.00    Types: Cigarettes    Quit date: 04/26/2012    Years since quitting: 7.7  . Smokeless tobacco: Never Used  Vaping Use  . Vaping Use: Never used  Substance Use Topics  . Alcohol use: Yes    Comment: occasional  . Drug use: No     Family Hx: The patient's family history includes Alzheimer's disease in her father and paternal grandmother; Cancer in her father and paternal grandfather; Hyperlipidemia in her sister; Hypertension in her mother and sister; Migraines in her father.   Labs/Other Tests and Data Reviewed:    EKG:  No ECG reviewed.  Recent Labs: 10/23/2019: ALT 12; BUN 21; Creatinine, Ser 0.95; Hemoglobin 13.7; Platelets 183; Potassium 4.2; Sodium 142; TSH 2.360   Recent Lipid Panel Lab Results  Component Value Date/Time   CHOL 149 10/23/2019 08:43 AM   TRIG 189 (H) 10/23/2019 08:43 AM   HDL 52 10/23/2019 08:43 AM   CHOLHDL 2.9 10/23/2019 08:43 AM   CHOLHDL 5.5 04/25/2012 02:06 PM   LDLCALC 66  10/23/2019 08:43 AM    Wt Readings from Last 3 Encounters:  01/22/20 201 lb (91.2 kg)  11/03/19 207 lb (93.9 kg)  10/29/19 203 lb (92.1 kg)     Objective:    Vital Signs:  Ht 5\' 7"  (1.702 m)   Wt 201 lb (91.2 kg)   LMP 07/24/2003 (Within Months)   BMI 31.48 kg/m   BP & HR doing better.  VITAL SIGNS:  reviewed Pleasant female in no acute distress. A&O x 3.  Normal Mood & Affect Non-labored respirations  ASSESSMENT & PLAN:    Problem List Items Addressed This Visit    CAD S/P DES PCI-proximal LAD (Chronic)    2 overlapping DES stents in the LAD-initial PCI found to have proximal edge dissection on follow-up.  She is doing remarkably well with no recurrent anginal symptoms.  Actually she is back to her baseline.   Plan was to continue uninterrupted DAPT with aspirin and Plavix until June of this year.  At this point, it would be okay for her to interrupt aspirin Plavix for any urgent or semielective procedures or surgeries.  Would prefer to go 1 complete year from the last PCI in December 2020 for more elective procedures.  At this point, would be okay to hold Plavix 5 to 7 days preop for urgent or semielective procedures/surgeries.      Relevant Medications   nebivolol (BYSTOLIC) 2.5 MG tablet   Other Relevant Orders   Lipid panel   Comprehensive metabolic panel   Coronary artery disease involving native coronary artery of native heart with angina pectoris (HCC) (Chronic)    Doing well now.  No further angina or exertional dyspnea.  Myoview no longer shows any ischemia.  There is a small fixed apical defect which could either be breast attenuation or mild residual apical infarct from her initial PCI.  Plan: She is on statin and beta-blocker along with DAPT (okay to hold or stop aspirin, at this point)  She asked about estradiol supplementation, I think as long as the plan is just to help with symptoms of hot flushes during menopause, rest probably acceptable, but would not  continue long-term once the symptoms have improved.  Vaginal topical treatment is probably not can cause any problems.      Relevant Medications   nebivolol (BYSTOLIC) 2.5 MG tablet   Essential (primary) hypertension (Chronic)    Blood pressure is well controlled on ARB-HCTZ along with labetalol.  Heart rate looks better, and less fatigue now that she is off diltiazem      Relevant Medications   nebivolol (BYSTOLIC) 2.5 MG tablet   Hyperlipidemia with target LDL less than 70 - Primary (Chronic)    Last lipids from April show her LDL being 66 on current dose of rosuvastatin.  For now, continue current meds.  I think her diet exercise and diet is definitely having an effect.  Triglycerides are little elevated, may need to consider fish oil or Vascepa if they stay too high.  This could be related to diet.  Reassess labs this fall prior to follow-up visit.  (If not checked by PCP we can order lipid panel prior to follow-up)      Relevant Medications   nebivolol (BYSTOLIC) 2.5 MG tablet   Other Relevant Orders   Lipid panel   Comprehensive metabolic panel   Obesity (BMI 30.0-34.9) (Chronic)    She keeps doing well with weight loss.  Diet and exercise have been working.  She still wants to lose more.  I congratulated her efforts.  This brings up the topic of her planned "tummy tuck" and excess skin removal.  If possible be nice to wait a full year out from her LAD PCI, but if necessary, this can be done at any point.  She is probably okay holding Plavix for surgery.      Relevant Orders   Lipid panel   Comprehensive metabolic panel  Rapid palpitations (Chronic)    Doing fairly well.  No longer requiring any additional therapy beyond Bystolic.  Tolerating coming off of the diltiazem.      Sinus bradycardia (Chronic)    Doing better since we stopped the diltiazem.  Has not required as needed short acting diltiazem for palpitations.  Relatively well controlled now with Bystolic       Relevant Medications   nebivolol (BYSTOLIC) 2.5 MG tablet    Other Visit Diagnoses    Dyslipidemia, goal LDL below 70       Relevant Medications   nebivolol (BYSTOLIC) 2.5 MG tablet   Other Relevant Orders   Lipid panel   Comprehensive metabolic panel      [id number] Education: The signs and symptoms of COVID-19 were discussed with the patient and how to seek care for testing (follow up with PCP or arrange E-visit).   The importance of social distancing was discussed today.  Time:   Today, I have spent 18 minutes with the patient with telehealth technology discussing the above problems.  4 minutes spent charting   Medication Adjustments/Labs and Tests Ordered: Current medicines are reviewed at length with the patient today.  Concerns regarding medicines are outlined above.   Patient Instructions  Medication Instructions:  No change   *If you need a refill on your cardiac medications before your next appointment, please call your pharmacy*   Lab Work:  If not checked by PCP this fall - would be due for Lipids & CMP in late Oct/Nov   If you have labs (blood work) drawn today and your tests are completely normal, you will receive your results only by: [name] MyChart Message (if you have MyChart) OR . A paper copy in the mail If you have any lab test that is abnormal or we need to change your treatment, we will call you to review the results.   Testing/Procedures:  none   Follow-Up: At [facility], you and your health needs are our priority.  As part of our continuing mission to provide you with exceptional heart care, we have created designated Provider Care Teams.  These Care Teams include your primary Cardiologist (physician) and Advanced Practice Providers (APPs -  Physician Assistants and Nurse Practitioners) who all work together to provide you with the care you need, when you need it.    Your next appointment:   5 month(s)  The format for your next appointment:    In Person  Provider:   [name], MD   Other Instructions Keep staying active and eating healthy  Happy July 4th !!!     Signed, [name], MD  01/22/2020 1:28 PM    [place]

## 2020-01-22 NOTE — Assessment & Plan Note (Signed)
Blood pressure is well controlled on ARB-HCTZ along with labetalol.  Heart rate looks better, and less fatigue now that she is off diltiazem

## 2020-01-22 NOTE — Assessment & Plan Note (Signed)
Last lipids from April show her LDL being 66 on current dose of rosuvastatin.  For now, continue current meds.  I think her diet exercise and diet is definitely having an effect.  Triglycerides are little elevated, may need to consider fish oil or Vascepa if they stay too high.  This could be related to diet.  Reassess labs this fall prior to follow-up visit.  (If not checked by PCP we can order lipid panel prior to follow-up)

## 2020-01-22 NOTE — Telephone Encounter (Signed)
RN spoke to patient. Instruction were given  from today's virtual visit 01/22/20 .  AVS SUMMARY has been sent by mychart . labslip mailed  Appointment scheduled for 06/28/20 at 10:20 am.  Patient verbalized understanding

## 2020-01-22 NOTE — Patient Instructions (Addendum)
Medication Instructions:  No change   *If you need a refill on your cardiac medications before your next appointment, please call your pharmacy*   Lab Work:  If not checked by PCP this fall - would be due for Lipids & CMP in late Oct/Nov   If you have labs (blood work) drawn today and your tests are completely normal, you will receive your results only by: [name] MyChart Message (if you have MyChart) OR . A paper copy in the mail If you have any lab test that is abnormal or we need to change your treatment, we will call you to review the results.   Testing/Procedures:  none   Follow-Up: At [facility], you and your health needs are our priority.  As part of our continuing mission to provide you with exceptional heart care, we have created designated Provider Care Teams.  These Care Teams include your primary Cardiologist (physician) and Advanced Practice Providers (APPs -  Physician Assistants and Nurse Practitioners) who all work together to provide you with the care you need, when you need it.    Your next appointment:   5 month(s)  The format for your next appointment:   In Person  Provider:   [name], MD   Other Instructions Keep staying active and eating healthy  Happy July 4th !!!

## 2020-05-03 ENCOUNTER — Other Ambulatory Visit: Payer: Self-pay | Admitting: Cardiology

## 2020-06-24 ENCOUNTER — Other Ambulatory Visit: Payer: Self-pay

## 2020-06-24 DIAGNOSIS — E785 Hyperlipidemia, unspecified: Secondary | ICD-10-CM

## 2020-06-24 DIAGNOSIS — Z9861 Coronary angioplasty status: Secondary | ICD-10-CM

## 2020-06-24 DIAGNOSIS — E669 Obesity, unspecified: Secondary | ICD-10-CM

## 2020-06-24 DIAGNOSIS — E66811 Obesity, class 1: Secondary | ICD-10-CM

## 2020-06-24 DIAGNOSIS — I2510 Atherosclerotic heart disease of native coronary artery without angina pectoris: Secondary | ICD-10-CM

## 2020-06-25 LAB — COMPREHENSIVE METABOLIC PANEL
ALT: 13 IU/L (ref 0–32)
AST: 14 IU/L (ref 0–40)
Albumin/Globulin Ratio: 1.6 (ref 1.2–2.2)
Albumin: 4.3 g/dL (ref 3.8–4.8)
Alkaline Phosphatase: 56 IU/L (ref 44–121)
BUN/Creatinine Ratio: 22 (ref 12–28)
BUN: 18 mg/dL (ref 8–27)
Bilirubin Total: 0.5 mg/dL (ref 0.0–1.2)
CO2: 25 mmol/L (ref 20–29)
Calcium: 9 mg/dL (ref 8.7–10.3)
Chloride: 104 mmol/L (ref 96–106)
Creatinine, Ser: 0.83 mg/dL (ref 0.57–1.00)
GFR calc Af Amer: 87 mL/min/{1.73_m2} (ref >59)
GFR calc non Af Amer: 75 mL/min/{1.73_m2} (ref >59)
Globulin, Total: 2.7 g/dL (ref 1.5–4.5)
Glucose: 79 mg/dL (ref 65–99)
Potassium: 4.4 mmol/L (ref 3.5–5.2)
Sodium: 141 mmol/L (ref 134–144)
Total Protein: 7 g/dL (ref 6.0–8.5)

## 2020-06-25 LAB — LIPID PANEL
Chol/HDL Ratio: 2.3 ratio (ref 0.0–4.4)
Cholesterol, Total: 138 mg/dL (ref 100–199)
HDL: 60 mg/dL (ref >39)
LDL Chol Calc (NIH): 53 mg/dL (ref 0–99)
Triglycerides: 151 mg/dL — ABNORMAL HIGH (ref 0–149)
VLDL Cholesterol Cal: 25 mg/dL (ref 5–40)

## 2020-06-28 ENCOUNTER — Encounter: Payer: Self-pay | Admitting: Cardiology

## 2020-06-28 ENCOUNTER — Other Ambulatory Visit: Payer: Self-pay

## 2020-06-28 ENCOUNTER — Ambulatory Visit (INDEPENDENT_AMBULATORY_CARE_PROVIDER_SITE_OTHER): Payer: Managed Care, Other (non HMO) | Admitting: Cardiology

## 2020-06-28 VITALS — BP 126/80 | HR 68 | Ht 67.0 in | Wt 203.0 lb

## 2020-06-28 DIAGNOSIS — I25119 Atherosclerotic heart disease of native coronary artery with unspecified angina pectoris: Secondary | ICD-10-CM

## 2020-06-28 DIAGNOSIS — E785 Hyperlipidemia, unspecified: Secondary | ICD-10-CM | POA: Diagnosis not present

## 2020-06-28 DIAGNOSIS — E669 Obesity, unspecified: Secondary | ICD-10-CM

## 2020-06-28 DIAGNOSIS — I10 Essential (primary) hypertension: Secondary | ICD-10-CM | POA: Diagnosis not present

## 2020-06-28 DIAGNOSIS — R001 Bradycardia, unspecified: Secondary | ICD-10-CM

## 2020-06-28 DIAGNOSIS — Z955 Presence of coronary angioplasty implant and graft: Secondary | ICD-10-CM | POA: Diagnosis not present

## 2020-06-28 NOTE — Patient Instructions (Signed)
Medication Instructions:   Stop taking Aspirin  Stop taking Ozempic - let your primary know if you are felling better  *If you need a refill on your cardiac medications before your next appointment, please call your pharmacy*   Lab Work:  notneeded  Testing/Procedures: Not needed  Follow-Up: At [facility], you and your health needs are our priority.  As part of our continuing mission to provide you with exceptional heart care, we have created designated Provider Care Teams.  These Care Teams include your primary Cardiologist (physician) and Advanced Practice Providers (APPs -  Physician Assistants and Nurse Practitioners) who all work together to provide you with the care you need, when you need it.     Your next appointment:   6 month(s) June 2021  The format for your next appointment:   In Person  Provider:   [name], MD

## 2020-06-28 NOTE — Progress Notes (Signed)
Primary Care Provider: Leonard Downing, MD Cardiologist: [name], MD Electrophysiologist: None  Clinic Note: Chief Complaint  Patient presents with  . Follow-up    Early 75-month-having lots of concerning symptoms since starting Ozempic  . Coronary Artery Disease    She is not having her anginal equivalent of "bubbly sensation chest "    Problem List Items Addressed This Visit    Coronary artery disease involving native coronary artery of native heart with angina pectoris ([place]) - Primary (Chronic)    Thankfully, she is not having any of her anginal equivalent symptoms.  This was exertional dyspnea and a bubbly sensation in her chest.  Follow-up echocardiogram showed resolution of anterior ischemia.  She may very well have had a small apical infarct that demonstrated is a fixed defect in both Myoview test..  Plan:   Continue statin and low-dose beta-blocker along with clopidogrel.  Okay to hold clopidogrel 5 days preop for surgery or procedures.  Discontinue aspirin.      Relevant Medications   rosuvastatin (CRESTOR) 40 MG tablet   Other Relevant Orders   EKG 12-Lead (Completed)   Presence of 2 overlapping Drug Coated Stents in LAD coronary artery (Chronic)    2 overlapping DES stent to the LAD.  With year can stop aspirin.  Would like to continue lifelong Plavix.   Okay to hold Plavix 5 days preop for surgeries or procedures.      Essential (primary) hypertension (Chronic)    Blood pressure looks well controlled on current dose of Bystolic and Hyzaar.  No change in dosing.      Relevant Medications   rosuvastatin (CRESTOR) 40 MG tablet   Other Relevant Orders   EKG 12-Lead (Completed)   Hyperlipidemia with target LDL less than 70 (Chronic)    Lipids look pretty well controlled.  Continue rosuvastatin.  Labs followed by PCP.      Relevant Medications   rosuvastatin (CRESTOR) 40 MG tablet   Obesity (BMI 30.0-34.9) (Chronic)    Attempted use of Ozempic  to help with weight loss.  Unfortunately she seems to be having quite a bit of adverse side effects of nausea, upset stomach, fatigue etc.  Most of her symptoms symptoms began since she started the Ozempic.  Plan: Discontinue Ozempic.      Sinus bradycardia (Chronic)    No longer on diltiazem.  Not having the tachycardia spells, therefore no longer on short acting diltiazem either.  She stable on current dose of Bystolic.      Relevant Medications   rosuvastatin (CRESTOR) 40 MG tablet     HPI:    Carrie Reynolds is a 64 y.o. female with a PMH notable for single-vessel CAD-severe LAD treated with 2 sequential overlapping DES stents (second 1 placed more proximal to the first for delayed proximal edge dissection) who presents today for 4-39-month follow-up.  CAD History:   05/11/20: DES PCI pLAD Resolute Onyx DES 3.5 x 8. 60% PDA.   Recurrent Sx - Abnormal Myoview - anterior ischemia --> relook Cath  07/01/20: 70% pre-stent prox edge dissection --> covered with 2nd Resolute Onyx DES 3.5 x 8 mm -- 3.8 mm).    F/u Myoview 10/2019: EF 55-60%. Small sized fixed apical defect - NO ISCHEMIA.  LOW RISK.   Carrie Reynolds was last seen on January 22, 2020 via telemedicine.  She was doing pretty well.  No further episodes of chest discomfort/bubbling in her chest.  Normal fluttering sensations.  No rapid heart  rates-never filled the diltiazem.  Still working on diet and exercise.  Recent Hospitalizations: none  Reviewed  CV studies:    The following studies were reviewed today: (if available, images/films reviewed: From Epic Chart or Care Everywhere) . none:  Interval History:   Carrie Reynolds noticing a lot of weird sensations in symptoms since starting Ozempic.  She had just been really feeling out of sorts.  Lots of GI upset.  Energy poor.  She is feeling confused with memory issues.  GI upset.  All this started when she started taking her Ozempic which was ostensibly for weight  loss.  Prior to this, she was doing fine.  She was doing 2 days a week in the pool, but now she is not able to go more than the left without getting extremely worn out.  Nothing is changed except for taking Ozempic.  She was also able to walk routinely around the neighborhood without any issues and is now having hard time getting the energy to do so.  On a positive note, she is not having any further fluttering or palpitation or irregular sensation in her chest that were similar to her previous angina.  She is not having them again palpitations having cut down her caffeine.  She just had labs checked by her PCP.  Unfortunately, not yet available.  CV Review of Symptoms (Summary): positive for - Poor energy, fatigue, nausea and upset stomach.  Exertional fatigue--all since starting Ozempic negative for - chest pain, edema, irregular heartbeat, orthopnea, palpitations, paroxysmal nocturnal dyspnea, rapid heart rate, shortness of breath or Syncope/near syncope or TIA/amaurosis fugax, claudication  The patient does not have symptoms concerning for COVID-19 infection (fever, chills, cough, or new shortness of breath).   REVIEWED OF SYSTEMS   Review of Systems  Constitutional: Positive for malaise/fatigue. Negative for weight loss.  HENT: Negative for congestion and nosebleeds.   Respiratory: Positive for shortness of breath (Exertional-more fatigued than dyspnea.).   Cardiovascular: Negative for leg swelling.  Gastrointestinal: Positive for abdominal pain and nausea. Negative for diarrhea (Just loose stools) and vomiting.  Musculoskeletal: Positive for myalgias. Negative for falls and joint pain.  Neurological: Positive for dizziness and headaches. Negative for focal weakness and weakness.  Psychiatric/Behavioral: Negative for depression and memory loss. The patient is not nervous/anxious and does not have insomnia.    I have reviewed and (if needed) personally updated the patient's problem list,  medications, allergies, past medical and surgical history, social and family history.   PAST MEDICAL HISTORY   Past Medical History:  Diagnosis Date  . Anxiety   . Arnold-Chiari malformation ([place]) 2006  . CAD S/P PCI 05/06/2019   05/06/2019: prox LAD (@ SPI) PCI with Resolute Onyx DES 3.5  x 8, Residual 60% PDA, Normal LVF; 07/02/2019: relook Cath for Anterior Ischemia on Mhoview --> CATH 70% pre-stent/proximal edge dissection --> proximal overlapping DES (Resolute Onyx 3.5 x 8 - new & old stent post-dilated to 3.8 mm)  . Cervical strain    syndrome  . Cervicogenic headache   . Concussion with loss of consciousness 10/09/2017  . CTS (carpal tunnel syndrome)   . Depression   . GERD (gastroesophageal reflux disease)   . Glucose intolerance (impaired glucose tolerance)   . High coronary artery calcium score    Score of 1460.  Coronary CTA shows high-grade proximal-mid LAD stenosis (CT FFR 0.50).  Also PDA/PLA 60 to 70% stenosis (CTO FFR 0.75)  . History of kidney stones   . Hypertension   .  Migraine headache    with visual aura  . Obesity   . OSA (obstructive sleep apnea)    not treated  . Postmenopausal   . Vertigo    post concussive    PAST SURGICAL HISTORY   Past Surgical History:  Procedure Laterality Date  . BUNIONECTOMY     right great toe  . CHOLECYSTECTOMY N/A 06/05/2018   Procedure: LAPAROSCOPIC CHOLECYSTECTOMY WITH POSSIBLE INTRAOPERATIVE CHOLANGIOGRAM;  Surgeon: Erroll Luna, MD;  Location: [place];  Service: General;  Laterality: N/A;  . CORONARY STENT INTERVENTION N/A 05/12/2019   Procedure: CORONARY STENT INTERVENTION;  Surgeon: Leonie Man, MD;  Location: [place] [facility];  Service: Cardiovascular;;; Culprit-p-mLAD 95% 950% SP1) --> Scoring Balloon PTCA -> DES PCI (Resolute DES 3.5 x 38 - 3.8 mm; 0% LAD& 55% SP1). -->  SP1 was somewhat jailed with TIMI II flow post PCI.  [name] CORONARY STENT INTERVENTION N/A 07/02/2019   Procedure: CORONARY STENT  INTERVENTION;  Surgeon: Leonie Man, MD;  Location: [place] [facility];  Service: Cardiovascular;  Laterality: N/A;  . GASTRIC BYPASS  11/2015   [facility]  . GYNECOLOGIC CRYOSURGERY    . HAND SURGERY    . LEFT HEART CATH AND CORONARY ANGIOGRAPHY N/A 05/12/2019   Procedure: LEFT HEART CATH AND CORONARY ANGIOGRAPHY;  Surgeon: Leonie Man, MD;  Location: [place] [facility];  Service: Cardiovascular;;; Culprit-p-mLAD 95% 950% SP1) --> DES PCI.;  RPAV 60% -small caliber vessel, did not appear flow-limiting (medical management).  EF 55 to 60%.  Normal LVEDP.  [name] LEFT HEART CATH AND CORONARY ANGIOGRAPHY N/A 07/02/2019   Procedure: LEFT HEART CATH AND CORONARY ANGIOGRAPHY;  Surgeon: Leonie Man, MD;  Location: [place] [facility];  Service: Cardiovascular;  Laterality: N/A;  . NM MYOVIEW LTD  06/24/2019   Carlton Adam): HIGH RISK.  EF 55%.  Large size, moderate severe defect in the anterior wall with associated anterior hypokinesis.  Suspect LAD territory.  [name] NM MYOVIEW LTD  10/23/2019   Normal EF 55 to 60%.  No EKG changes.  Small size mild severity fixed defect in the apical wall consistent with breast attenuation versus small prior infarct.  Marked improvement from anterior apical perfusion abnormality seen in February 2020.  . TUBAL LIGATION Bilateral   . VAGINAL HYSTERECTOMY  2003   with LSO and right salpingectomy; right ovary still present    Initial PCI 05/11/20 - Resolute Onyx DES 3.5 x 8; - re-look Cath 07/01/20 with 2nd overlapping Resolute Onyx DES 3.5 x 8 - post-dilated to 3.8 mm. Residual 60% PDA.   Immunization History  Administered Date(s) Administered  . Influenza Inj Mdck Quad Pf 04/03/2018  . Influenza,inj,Quad PF,6+ Mos 04/08/2017  . Janssen (J&J) SARS-COV-2 Vaccination 09/29/2019  . Td 07/23/2008  . Tdap 05/24/2019  . Zoster Recombinat (Shingrix) 04/03/2018    MEDICATIONS/ALLERGIES   Current Meds  Medication Sig  . BYSTOLIC 2.5 MG tablet TAKE 1 TABLET BY MOUTH  EVERY DAY  . Cholecalciferol (VITAMIN D3) 125 MCG (5000 UT) TABS Take 5,000 Units by mouth every morning.   . clopidogrel (PLAVIX) 75 MG tablet Take 1 tablet (75 mg total) by mouth daily.  [name] DHEA 25 MG CAPS Take 25 mg by mouth daily.   [name] estradiol (CLIMARA - DOSED IN MG/24 HR) 0.025 mg/24hr patch Place 0.025 mg onto the skin once a week.  . losartan-hydrochlorothiazide (HYZAAR) 100-12.5 MG tablet Take 0.5 tablets by mouth daily.  . Multiple Vitamins-Minerals (MULTIVITAMIN WOMEN PO) Take 1  tablet by mouth daily.  . Multiple Vitamins-Minerals (ONE-A-DAY WOMENS 50+ ADVANTAGE PO) Take by mouth.  . nitroGLYCERIN (NITROSTAT) 0.4 MG SL tablet Place 1 tablet (0.4 mg total) under the tongue every 5 (five) minutes as needed.  . pantoprazole (PROTONIX) 40 MG tablet Take 40 mg by mouth daily.  . rosuvastatin (CRESTOR) 40 MG tablet Take by mouth.  . senna-docusate (SENOKOT-S) 8.6-50 MG tablet Take by mouth.  . sertraline (ZOLOFT) 50 MG tablet Take 50 mg by mouth daily.  . [DISCONTINUED] aspirin EC 81 MG tablet Take 1 tablet (81 mg total) by mouth daily.  . [DISCONTINUED] Semaglutide, 1 MG/DOSE, (OZEMPIC, 1 MG/DOSE,) 4 MG/3ML SOPN Inject into the skin.    Allergies  Allergen Reactions  . Ceclor [Cefaclor] Hives and Rash  . Tetracyclines & Related Hives  . Lipitor [Atorvastatin Calcium] Cough  . Penicillins Swelling and Other (See Comments)    Has patient had a PCN reaction causing immediate rash, facial/tongue/throat swelling, SOB or lightheadedness with hypotension: No Has patient had a PCN reaction causing severe rash involving mucus membranes or skin necrosis: No Has patient had a PCN reaction that required hospitalization: Yes Has patient had a PCN reaction occurring within the last 10 years: No If all of the above answers are "NO", then may proceed with Cephalosporin use.  [name] Amoxicillin Rash  . Ampicillin Rash  . Lisinopril Cough  . Simvastatin Cough    SOCIAL HISTORY/FAMILY HISTORY    Reviewed in Epic:  Pertinent findings: None  OBJCTIVE -PE, EKG, labs   Wt Readings from Last 3 Encounters:  06/28/20 203 lb (92.1 kg)  01/22/20 201 lb (91.2 kg)  11/03/19 207 lb (93.9 kg)    Physical Exam: BP 126/80   Pulse 68   Ht 5\' 7"  (1.702 m)   Wt 203 lb (92.1 kg)   LMP 07/24/2003 (Within Months)   SpO2 99%   BMI 31.79 kg/m  Physical Exam Vitals reviewed.  Constitutional:      General: She is not in acute distress.    Appearance: Normal appearance. She is obese. She is not ill-appearing or toxic-appearing.     Comments: Well-groomed.  Has new hair style  HENT:     Head: Normocephalic and atraumatic.  Neck:     Vascular: No carotid bruit, hepatojugular reflux or JVD.  Cardiovascular:     Rate and Rhythm: Normal rate and regular rhythm. Occasional extrasystoles are present.    Chest Wall: PMI is not displaced.     Pulses: Intact distal pulses.     Heart sounds: Normal heart sounds. No murmur heard. No friction rub. No gallop.   Pulmonary:     Effort: Pulmonary effort is normal. No respiratory distress.     Breath sounds: Normal breath sounds.  Chest:     Chest wall: No tenderness.  Musculoskeletal:        General: Swelling (Trivial pedal) present. Normal range of motion.     Cervical back: Normal range of motion and neck supple.  Neurological:     General: No focal deficit present.     Mental Status: She is alert and oriented to person, place, and time.  Psychiatric:        Behavior: Behavior normal.        Thought Content: Thought content normal.        Judgment: Judgment normal.     Comments: Seems little anxious and upset     Adult ECG Report  Rate: 68 ;  Rhythm: normal sinus  rhythm and Normal axis, intervals and durations.;   Narrative Interpretation: Stable EKG  Recent Labs: Reviewed Lab Results  Component Value Date   CHOL 138 06/24/2020   HDL 60 06/24/2020   LDLCALC 53 06/24/2020   TRIG 151 (H) 06/24/2020   CHOLHDL 2.3 06/24/2020   Lab  Results  Component Value Date   CREATININE 0.83 06/24/2020   BUN 18 06/24/2020   NA 141 06/24/2020   K 4.4 06/24/2020   CL 104 06/24/2020   CO2 25 06/24/2020   Lab Results  Component Value Date   TSH 2.360 10/23/2019   No results found for: HGBA1C   ASSESSMENT/PLAN    Problem List Items Addressed This Visit    Coronary artery disease involving native coronary artery of native heart with angina pectoris ([place]) - Primary (Chronic)    Thankfully, she is not having any of her anginal equivalent symptoms.  This was exertional dyspnea and a bubbly sensation in her chest.  Follow-up echocardiogram showed resolution of anterior ischemia.  She may very well have had a small apical infarct that demonstrated is a fixed defect in both Myoview test..  Plan:   Continue statin and low-dose beta-blocker along with clopidogrel.  Okay to hold clopidogrel 5 days preop for surgery or procedures.  Discontinue aspirin.      Relevant Medications   rosuvastatin (CRESTOR) 40 MG tablet   Other Relevant Orders   EKG 12-Lead (Completed)   Presence of 2 overlapping Drug Coated Stents in LAD coronary artery (Chronic)    2 overlapping DES stent to the LAD.  With year can stop aspirin.  Would like to continue lifelong Plavix.   Okay to hold Plavix 5 days preop for surgeries or procedures.      Essential (primary) hypertension (Chronic)    Blood pressure looks well controlled on current dose of Bystolic and Hyzaar.  No change in dosing.      Relevant Medications   rosuvastatin (CRESTOR) 40 MG tablet   Other Relevant Orders   EKG 12-Lead (Completed)   Hyperlipidemia with target LDL less than 70 (Chronic)    Lipids look pretty well controlled.  Continue rosuvastatin.  Labs followed by PCP.      Relevant Medications   rosuvastatin (CRESTOR) 40 MG tablet   Obesity (BMI 30.0-34.9) (Chronic)    Attempted use of Ozempic to help with weight loss.  Unfortunately she seems to be having quite a bit of  adverse side effects of nausea, upset stomach, fatigue etc.  Most of her symptoms symptoms began since she started the Ozempic.  Plan: Discontinue Ozempic.      Sinus bradycardia (Chronic)    No longer on diltiazem.  Not having the tachycardia spells, therefore no longer on short acting diltiazem either.  She stable on current dose of Bystolic.      Relevant Medications   rosuvastatin (CRESTOR) 40 MG tablet       COVID-19 Education: The signs and symptoms of COVID-19 were discussed with the patient and how to seek care for testing (follow up with PCP or arrange E-visit).   The importance of social distancing and COVID-19 vaccination was discussed today. 1 min The patient is practicing social distancing & Masking.   I spent a total of 80minutes with the patient spent in direct patient consultation.  Additional time spent with chart review  / charting (studies, outside notes, etc): 10 Total Time: 31 min   Current medicines are reviewed at length with the patient today.  (+/-  concerns) n/a  This visit occurred during the SARS-CoV-2 public health emergency.  Safety protocols were in place, including screening questions prior to the visit, additional usage of staff PPE, and extensive cleaning of exam room while observing appropriate contact time as indicated for disinfecting solutions.  Notice: This dictation was prepared with Dragon dictation along with smaller phrase technology. Any transcriptional errors that result from this process are unintentional and may not be corrected upon review.  Patient Instructions / Medication Changes & Studies & Tests Ordered   Patient Instructions  Medication Instructions:   Stop taking Aspirin  Stop taking Ozempic - let your primary know if you are felling better  *If you need a refill on your cardiac medications before your next appointment, please call your pharmacy*   Lab Work:  notneeded  Testing/Procedures: Not  needed  Follow-Up: At [facility], you and your health needs are our priority.  As part of our continuing mission to provide you with exceptional heart care, we have created designated Provider Care Teams.  These Care Teams include your primary Cardiologist (physician) and Advanced Practice Providers (APPs -  Physician Assistants and Nurse Practitioners) who all work together to provide you with the care you need, when you need it.     Your next appointment:   6 month(s) June 2021  The format for your next appointment:   In Person  Provider:   [name], MD      Studies Ordered:   Orders Placed This Encounter  Procedures  . EKG 12-Lead     [name], M.D., M.S. Interventional Cardiologist   Pager # [phone] Phone # [phone] [address] [place], [place] [address]   Thank you for choosing Heartcare at [facility]!!

## 2020-07-09 ENCOUNTER — Encounter: Payer: Self-pay | Admitting: Cardiology

## 2020-07-09 NOTE — Assessment & Plan Note (Signed)
Attempted use of Ozempic to help with weight loss.  Unfortunately she seems to be having quite a bit of adverse side effects of nausea, upset stomach, fatigue etc.  Most of her symptoms symptoms began since she started the Ozempic.  Plan: Discontinue Ozempic.

## 2020-07-09 NOTE — Assessment & Plan Note (Signed)
2 overlapping DES stent to the LAD.  With year can stop aspirin.  Would like to continue lifelong Plavix.   Okay to hold Plavix 5 days preop for surgeries or procedures.

## 2020-07-09 NOTE — Assessment & Plan Note (Signed)
Lipids look pretty well controlled.  Continue rosuvastatin.  Labs followed by PCP.

## 2020-07-09 NOTE — Assessment & Plan Note (Signed)
No longer on diltiazem.  Not having the tachycardia spells, therefore no longer on short acting diltiazem either.  She stable on current dose of Bystolic.

## 2020-07-09 NOTE — Assessment & Plan Note (Deleted)
2 overlapping DES stent to the LAD.  With year can stop aspirin.  Would like to continue lifelong Plavix.   Okay to hold Plavix 5 days preop for surgeries or procedures.

## 2020-07-09 NOTE — Assessment & Plan Note (Signed)
Blood pressure looks well controlled on current dose of Bystolic and Hyzaar.  No change in dosing.

## 2020-07-09 NOTE — Assessment & Plan Note (Signed)
Thankfully, she is not having any of her anginal equivalent symptoms.  This was exertional dyspnea and a bubbly sensation in her chest.  Follow-up echocardiogram showed resolution of anterior ischemia.  She may very well have had a small apical infarct that demonstrated is a fixed defect in both Myoview test..  Plan:   Continue statin and low-dose beta-blocker along with clopidogrel.  Okay to hold clopidogrel 5 days preop for surgery or procedures.  Discontinue aspirin.

## 2020-09-21 ENCOUNTER — Other Ambulatory Visit: Payer: Self-pay | Admitting: Cardiology

## 2020-10-13 ENCOUNTER — Telehealth: Payer: Self-pay | Admitting: Cardiology

## 2020-10-13 NOTE — Telephone Encounter (Signed)
Pt c/o Shortness Of Breath: STAT if SOB developed within the last 24 hours or pt is noticeably SOB on the phone  1. Are you currently SOB (can you hear that pt is SOB on the phone)? No   2. How long have you been experiencing SOB?  1 month, per patient  3. Are you SOB when sitting or when up moving around? When up and moving around   4. Are you currently experiencing any other symptoms? Left arm pain that radiates into jaw (denies chest pain)  Pt c/o BP issue: STAT if pt c/o blurred vision, one-sided weakness or slurred speech  1. What are your last 5 BP readings?  No readings available  2. Are you having any other symptoms (ex. Dizziness, headache, blurred vision, passed out)?  Irregular HR  3. What is your BP issue?  Patient states she is in the car and she does not have her BP log available, but she just left her dermatologist and she was advised to contact Dr. Ellyn Hack regarding her low BP, irregular HR, and SOB. She was advised that her BP was approximately 106/60  Patient c/o Palpitations:  High priority if patient c/o lightheadedness, shortness of breath, or chest pain  1) How long have you had palpitations/irregular HR/ Afib? Are you having the symptoms now?  Patient states her HR has been irregular for about 1 month. Patient is currently asymptomatic  2) Are you currently experiencing lightheadedness, SOB or CP?  No   3) Do you have a history of afib (atrial fibrillation) or irregular heart rhythm?  No   4) Have you checked your BP or HR? (document readings if available):  Patient states her BP is approximately 106/60  5) Are you experiencing any other symptoms?  No

## 2020-10-13 NOTE — Telephone Encounter (Signed)
Spoke to patient she stated for the past 2 weeks she has been having left arm pain with pain radiating up into jaw.No pain at present.Stated she gets tired like she did before she had heart stent.Appointment scheduled with Sande Rives PA 10/31/20 at 2:15 pm.Advised I will send message to Dr.Harding's RN for a possible sooner appointment.Advised to go to ED if needed.

## 2020-10-13 NOTE — Telephone Encounter (Signed)
Spoke to patient see telephone note.

## 2020-10-20 NOTE — Progress Notes (Signed)
Cardiology Office Note:    Date:  10/31/2020   ID:  Carrie Reynolds, DOB 03/23/1956, MRN [id number]  PCP:  Leonard Downing, MD  Cardiologist:  [name], MD  Electrophysiologist:  None   Referring MD: Leonard Downing, *   Chief Complaint: left arm pain and near syncope  History of Present Illness:    Carrie Reynolds is a 65 y.o. female with a history of single vessel CAD s/p 2 overlapping DES to LAD (first sent placed in 04/2019 and second placed in 06/2019) sinus bradycardia, obstructive sleep apnea on CPAP,  hypertension, hyperlipidemia, GERD, and obesity who is followed by Dr. Ellyn Hack and presents today for further evaluation of left arm pain and near syncope.  Patient has a history of single vessel CAD involving LAD. She underwent successful PCI with DES to proximal LAD in 04/2019. She had recurrent symptoms following this and Myoview in 06/2019 showed ischemia in LAD territory. Therefore, she had repeat LHC on 07/02/2019 and showed 70% stenosis of proximal LAD that was felt to likely be plaque/dissection shift from previous stent. Prior stent was widely patent. She underwent PCI with DES covering the new lesion and overlapping the prior stent. Myoview in 10/2019 was low risk with no evidence of ischemia. She does have a history of rapid palpitations; however, she has also had some sinus bradycardia in the past that improved with stopping Diltiazem. Heart rates have been stable on Bystolic. Patient was last seen by Dr. Ellyn Hack in 06/2020 at which time she was not feeling well after starting Ozempic. She noted poor energy and GI upset. However, she was doing well from a cardiac standpoint.   Patient presents today for further evaluation of left arm pain and near syncope. Patient states for the past month, she has had multiple episodes of left arm pain that starts in her mid upper arm and radiates to her chest and up the right side of her neck. Pain resolves if she squeezes  her left arm where the pain originates. No recent injury or overuse to explain this pain. This is not similar to prior cardiac symptoms before previous PCIs. However, she has felt very fatigued which is similar to how she felt prior to PCIs. She was at [facility] last week with her family and was walking to the beach when she became very short of breath and near syncopal with tunnel vision. She also had some left arm pain at this time. Denies any palpitations. She denies any loss of consciousness. The near syncope and tunnel vision also reminded her of prior symptoms before PCI. She ended up being admitted at [facility] for a couple of days for further evaluation. I do not have records available from this hospitalization but patient gives a good history. She states EKG was unremarkable and cardiac enzymes were normal. She states she had a stress which was also normal. She was reportedly given IV fluids in the ED which helped patient feel better so she was wondering if she could have just been dehydrated. She also states her heart rates were noted to be in the 40's during that hospitalization and her Diltiazem was stopped. Since then she has had no recurrent episodes of arm pain or near syncope. However, she is still feeling very fatigued and states she has felt very unstable on her feet and off balance since all of this happened. No orthopnea, PND, or edema.  Past Medical History:  Diagnosis Date  . Anxiety   .  Arnold-Chiari malformation ([place]) 2006  . CAD S/P PCI 05/06/2019   05/06/2019: prox LAD (@ SPI) PCI with Resolute Onyx DES 3.5  x 8, Residual 60% PDA, Normal LVF; 07/02/2019: relook Cath for Anterior Ischemia on Mhoview --> CATH 70% pre-stent/proximal edge dissection --> proximal overlapping DES (Resolute Onyx 3.5 x 8 - new & old stent post-dilated to 3.8 mm)  . Cervical strain    syndrome  . Cervicogenic headache   . Concussion with loss of consciousness 10/09/2017  . CTS  (carpal tunnel syndrome)   . Depression   . GERD (gastroesophageal reflux disease)   . Glucose intolerance (impaired glucose tolerance)   . High coronary artery calcium score    Score of 1460.  Coronary CTA shows high-grade proximal-mid LAD stenosis (CT FFR 0.50).  Also PDA/PLA 60 to 70% stenosis (CTO FFR 0.75)  . History of kidney stones   . Hypertension   . Migraine headache    with visual aura  . Obesity   . OSA (obstructive sleep apnea)    not treated  . Postmenopausal   . Vertigo    post concussive    Past Surgical History:  Procedure Laterality Date  . BUNIONECTOMY     right great toe  . CHOLECYSTECTOMY N/A 06/05/2018   Procedure: LAPAROSCOPIC CHOLECYSTECTOMY WITH POSSIBLE INTRAOPERATIVE CHOLANGIOGRAM;  Surgeon: Erroll Luna, MD;  Location: [place];  Service: General;  Laterality: N/A;  . CORONARY STENT INTERVENTION N/A 05/12/2019   Procedure: CORONARY STENT INTERVENTION;  Surgeon: Leonie Man, MD;  Location: [place] [facility];  Service: Cardiovascular;;; Culprit-p-mLAD 95% 950% SP1) --> Scoring Balloon PTCA -> DES PCI (Resolute DES 3.5 x 38 - 3.8 mm; 0% LAD& 55% SP1). -->  SP1 was somewhat jailed with TIMI II flow post PCI.  [name] CORONARY STENT INTERVENTION N/A 07/02/2019   Procedure: CORONARY STENT INTERVENTION;  Surgeon: Leonie Man, MD;  Location: [place] [facility];  Service: Cardiovascular;  Laterality: N/A;  . GASTRIC BYPASS  11/2015   [facility]  . GYNECOLOGIC CRYOSURGERY    . HAND SURGERY    . LEFT HEART CATH AND CORONARY ANGIOGRAPHY N/A 05/12/2019   Procedure: LEFT HEART CATH AND CORONARY ANGIOGRAPHY;  Surgeon: Leonie Man, MD;  Location: [place] [facility];  Service: Cardiovascular;;; Culprit-p-mLAD 95% 950% SP1) --> DES PCI.;  RPAV 60% -small caliber vessel, did not appear flow-limiting (medical management).  EF 55 to 60%.  Normal LVEDP.  [name] LEFT HEART CATH AND CORONARY ANGIOGRAPHY N/A 07/02/2019   Procedure: LEFT HEART CATH AND CORONARY ANGIOGRAPHY;   Surgeon: Leonie Man, MD;  Location: [place] [facility];  Service: Cardiovascular;  Laterality: N/A;  . NM MYOVIEW LTD  06/24/2019   Carlton Adam): HIGH RISK.  EF 55%.  Large size, moderate severe defect in the anterior wall with associated anterior hypokinesis.  Suspect LAD territory.  [name] NM MYOVIEW LTD  10/23/2019   Normal EF 55 to 60%.  No EKG changes.  Small size mild severity fixed defect in the apical wall consistent with breast attenuation versus small prior infarct.  Marked improvement from anterior apical perfusion abnormality seen in February 2020.  . TUBAL LIGATION Bilateral   . VAGINAL HYSTERECTOMY  2003   with LSO and right salpingectomy; right ovary still present    Current Medications: Current Meds  Medication Sig  . BYSTOLIC 2.5 MG tablet TAKE 1 TABLET BY MOUTH EVERY DAY  . Cholecalciferol (VITAMIN D3) 125 MCG (5000 UT) TABS Take 5,000 Units by mouth every  morning.   . clopidogrel (PLAVIX) 75 MG tablet Take 1 tablet (75 mg total) by mouth daily.  [name] estradiol (CLIMARA - DOSED IN MG/24 HR) 0.025 mg/24hr patch Place 0.025 mg onto the skin once a week.  . losartan-hydrochlorothiazide (HYZAAR) 100-12.5 MG tablet Take 0.5 tablets by mouth daily.  . Multiple Vitamins-Minerals (MULTIVITAMIN WOMEN PO) Take 1 tablet by mouth daily.  . Multiple Vitamins-Minerals (ONE-A-DAY WOMENS 50+ ADVANTAGE PO) Take by mouth.  . nitroGLYCERIN (NITROSTAT) 0.4 MG SL tablet Place 1 tablet (0.4 mg total) under the tongue every 5 (five) minutes as needed.  . pantoprazole (PROTONIX) 40 MG tablet Take 40 mg by mouth daily.  . rosuvastatin (CRESTOR) 40 MG tablet Take by mouth.  . sertraline (ZOLOFT) 50 MG tablet Take 50 mg by mouth daily.     Allergies:   Ceclor [cefaclor], Tetracyclines & related, Lipitor [atorvastatin calcium], Penicillins, Amoxicillin, Ampicillin, Lisinopril, and Simvastatin   Social History   Socioeconomic History  . Marital status: Married    Spouse name: Dorene Bruni  . Number  of children: 2  . Years of education: 2  . Highest education level: High school graduate  Occupational History  . Occupation: Retired Best boy: [employer]    Comment: Logistics, warehouse  Tobacco Use  . Smoking status: Former Smoker    Packs/day: 0.10    Years: 30.00    Pack years: 3.00    Types: Cigarettes    Quit date: 04/26/2012    Years since quitting: 8.5  . Smokeless tobacco: Never Used  Vaping Use  . Vaping Use: Never used  Substance and Sexual Activity  . Alcohol use: Yes    Comment: occasional  . Drug use: No  . Sexual activity: Yes    Partners: Male    Birth control/protection: Surgical    Comment: hysterectomy  Other Topics Concern  . Not on file  Social History Narrative   ** Merged History Encounter **       Lives with husband [name]). She travels very frequently with her job and is gone most weeks out of the month. She is active and exercises when able.   Social Determinants of Health   Financial Resource Strain: Not on file  Food Insecurity: Not on file  Transportation Needs: Not on file  Physical Activity: Not on file  Stress: Not on file  Social Connections: Not on file     Family History: The patient's family history includes Alzheimer's disease in her father and paternal grandmother; Cancer in her father and paternal grandfather; Hyperlipidemia in her sister; Hypertension in her mother and sister; Migraines in her father.  ROS:   Please see the history of present illness.     EKGs/Labs/Other Studies Reviewed:    The following studies were reviewed today:  Echocardiogram 05/01/2017: Study Conclusions: - Left ventricle: The cavity size was normal. Systolic function was  normal. The estimated ejection fraction was in the range of 60%  to 65%. Diastolic dysfunction, grade indeterminate. Wall motion  was normal; there were no regional wall motion abnormalities.  Mild concentric and severe focal basal septal  hypertrophy.  - Mitral valve: There was mild regurgitation. _______________  Monitor 05/2019:  Predominant rhythm is sinus rhythm. Minimum heart rate 45 bpm. Maximum 145 bpm. Average heart rate 79 bpm.  No evidence of arrhythmias: No SVT, atrial flutter, atrial fib, ventricular tachycardia or ventricular fibrillation.  Minimal PACs and PVCs. There were rare episodes of couplets and triplets of PACs  and PVCs.   Essentially normal monitor.  No evidence of any abnormal findings.  Suspect that she may be feeling the PACs and PVCs, but nothing more aggressive to treat.  PVC burden is not significant. _______________  Cardiac Catheterization 07/02/2019:   The left ventricular systolic function is normal. The left ventricular ejection fraction is 55-65% by visual estimate.  LV end diastolic pressure is normal.  -------------------------------  Previously placed Prox LAD to Mid LAD drug eluting stent is widely patent with 55% stenosed side branch in 1st Sept.  Prox LAD lesion is 70% stenosed -likely plaque/dissection shift from previously placed stent, suggests proximal edge dissection..  A drug-eluting stent was successfully placed covering the new lesion in the overlapping the prior stent, using a STENT RESOLUTE ONYX 3.5X8. -Postdilated to 3.8 mm.  Post intervention, there is a 0% residual stenosis.  Post intervention, the jailed septal perforator branch was pinched to 70% residual stenosis.  -----------------------------------------  Mid LAD lesion is 25% stenosed.  Ramus lesion is 30% stenosed.  RPAV lesion is 60% stenosed.   Summary:  Proximal stent edge dissection and proximal LAD -->  successfully covered with additional RESOLUTE ONYX DES 3.5 mm x 8 mm overlapping proximal edge of her stent (postdilated to 3.8 mm)  Otherwise no significant change to coronary anatomy from last cath.  Normal EF and EDP.  Recommendations:  Anticipate same-day discharge if  stable.  Continue current medications, however she is supposedly not taking Metformin.  We can discuss potential other options and follow-up. ? She was recently started on Bystolic, will continue ? Only able to tolerate moderate dose rosuvastatin, we are evaluating for possible PCSK9 inhibitor _______________  Leane Call 10/23/2019:  The left ventricular ejection fraction is normal (55-65%).  Nuclear stress EF: 56%.  There was no ST segment deviation noted during stress.  No T wave inversion was noted during stress.  Defect 1: There is a small defect of mild severity present in the apical anterior location.  The study is normal.  This is a low risk study.   Low risk stress nuclear study with normal perfusion and normal left ventricular regional and global systolic function. Compared to 06/24/2019, there is marked improvement in anteroapical perfusion. A tiny residual fixed perfusion abnormality likely represents breast attenuation. Overall left ventricular systolic function has also improved.  EKG:  EKG ordered today. EKG personally reviewed and demonstrates sinus bradycardia, rate 55 bpm, with no acute ischemic changes. Normal axis. Normal PR and QRS intervals. QTc 399 ms.  Recent Labs: 06/24/2020: ALT 13; BUN 18; Creatinine, Ser 0.83; Potassium 4.4; Sodium 141  Recent Lipid Panel    Component Value Date/Time   CHOL 138 06/24/2020 1152   TRIG 151 (H) 06/24/2020 1152   HDL 60 06/24/2020 1152   CHOLHDL 2.3 06/24/2020 1152   CHOLHDL 5.5 04/25/2012 1406   VLDL 76 (H) 04/25/2012 1406   LDLCALC 53 06/24/2020 1152    Physical Exam:    Vital Signs: BP 126/76   Pulse (!) 55   Ht 5\' 7"  (1.702 m)   Wt 206 lb 6.4 oz (93.6 kg)   LMP 07/24/2003 (Within Months)   BMI 32.33 kg/m     Wt Readings from Last 3 Encounters:  10/31/20 206 lb 6.4 oz (93.6 kg)  06/28/20 203 lb (92.1 kg)  01/22/20 201 lb (91.2 kg)     General: 65 y.o. female in no acute distress. HEENT:  Normocephalic and atraumatic. Sclera clear. Neck: Supple. No carotid bruits. No JVD. Heart:  Bradycardic with normal rate. Distinct S1 and S2. No significant murmurs, gallops, or rubs. Radial pulses 2+ and equal bilaterally. Lungs: No increased work of breathing. Clear to ausculation bilaterally. No wheezes, rhonchi, or rales.  Abdomen: Soft, non-distended, and non-tender to palpation.  Extremities: No lower extremity edema.    Skin: Warm and dry. Neuro: Alert and oriented x3. No focal deficits. Psych: Normal affect. Responds appropriately.  Assessment:    1. Left arm pain   2. Coronary artery disease involving native heart without angina pectoris, unspecified vessel or lesion type   3. Near syncope   4. History of palpitations   5. Sinus bradycardia   6. Fatigue, unspecified type   7. Primary hypertension   8. Hyperlipidemia, unspecified hyperlipidemia type   9. Obesity (BMI 30.0-34.9)     Plan:    Left Arm Pain History of CAD - History of 2 overlapping DES to LAD. Last stent placed in 06/2019. Myoview in 10/2019 was low risk with tiny residual fixed defect (likely breast attenuation) but no ischemia. Patient has had multiple episodes of left arm pain that radiates to her chest and neck as well as fatigue. She was recently admitted at [facility] due to episode of near syncope that remind her of symptoms before prior PCIs. Cardiac work-up there was unremarkable - she reportedly had a stress test there that was normal. - EKG shows no acute ischemic changes. - Continue Plavix 75mg  daily. - Continue beta-blocker and high-intensity statin.  - Left arm pain sounds is rather atypical and improves when she squeezes her arm. However, other symptoms remind her of prior cardiac symptoms. Will request records from [facility]. Will go ahead and check Echo. If patient has recurrent symptoms, may need cardiac catheterization.   Near Syncope - Patient had recent episode of near  syncope. Occurred while she was walking to the beach. No palpitations at the time. Improved with IV fluids so may have been secondary to dehydration. Also noted to be bradycardic at the hospital so that may have been playing a role as well. - Will check Echo.  - Similar to prior symptom before PCI in 2020. If she has recurrence of this, may need cardiac catheterization as above. - May also need monitor if she has recurrent symptoms given bradycardia but will hold off for now given this was an isolated event.  History of Palpitations Sinus Bradycardia - She was noted to be bradycardic with heart rates in the 40's during recent hospitalization at [facility]. Diltiazem was stopped. Heart rate 55 today and patient asymptomatic. - Continue Bystolic 2.5mg  daily.  Fatigue - Patient reports feeling very fatigued and unsteady on her feet. Sounds like she had CBC and BMET checked during recent admission and these were reportedly unremarkable. Will check TSH.  Hypertension - BP well controlled. - Continue current medications: Losartan-HCTZ 50-6.25mg  (halft of 100-12.5mg  tablet) daily and Bystolic 2.5mg  daily.   Hyperlipidemia - Last lipid panel from 06/2020: Total Cholesterol 128, Triglycerides 151, HDL 60, LDL 53.  - LDL goal <70 given CAD. - Continue Crestor 40mg  daily.  Obesity - BMI 32.33. - Did not tolerate Ozempic well.  Disposition: Follow up in 1 month after Echo.   Medication Adjustments/Labs and Tests Ordered: Current medicines are reviewed at length with the patient today.  Concerns regarding medicines are outlined above.  Orders Placed This Encounter  Procedures  . TSH  . EKG 12-Lead  . ECHOCARDIOGRAM COMPLETE   No orders of the defined types  were placed in this encounter.   Patient Instructions  Medication Instructions:  Continue current medications  *If you need a refill on your cardiac medications before your next appointment, please call your pharmacy*   Lab  Work: TSH  If you have labs (blood work) drawn today and your tests are completely normal, you will receive your results only by: [name] MyChart Message (if you have MyChart) OR . A paper copy in the mail If you have any lab test that is abnormal or we need to change your treatment, we will call you to review the results.   Testing/Procedures: Your physician has requested that you have an echocardiogram. Echocardiography is a painless test that uses sound waves to create images of your heart. It provides your doctor with information about the size and shape of your heart and how well your heart's chambers and valves are working. This procedure takes approximately one hour. There are no restrictions for this procedure.   Follow-Up: At [facility], you and your health needs are our priority.  As part of our continuing mission to provide you with exceptional heart care, we have created designated Provider Care Teams.  These Care Teams include your primary Cardiologist (physician) and Advanced Practice Providers (APPs -  Physician Assistants and Nurse Practitioners) who all work together to provide you with the care you need, when you need it.  We recommend signing up for the patient portal called "MyChart".  Sign up information is provided on this After Visit Summary.  MyChart is used to connect with patients for Virtual Visits (Telemedicine).  Patients are able to view lab/test results, encounter notes, upcoming appointments, etc.  Non-urgent messages can be sent to your provider as well.   To learn more about what you can do with MyChart, go to [email or web address].    Your next appointment:   1 month(s)  The format for your next appointment:   In Person  Provider:   You may see [name], MD or one of the following Advanced Practice Providers on your designated Care Team:    Rosaria Ferries, PA-C  [name], DNP, ANP        Signed, Darreld Mclean, PA-C  10/31/2020  4:52 PM    [place]

## 2020-10-31 ENCOUNTER — Other Ambulatory Visit: Payer: Self-pay

## 2020-10-31 ENCOUNTER — Ambulatory Visit (INDEPENDENT_AMBULATORY_CARE_PROVIDER_SITE_OTHER): Payer: Medicare Other | Admitting: Student

## 2020-10-31 ENCOUNTER — Encounter: Payer: Self-pay | Admitting: Student

## 2020-10-31 VITALS — BP 126/76 | HR 55 | Ht 67.0 in | Wt 206.4 lb

## 2020-10-31 DIAGNOSIS — R5383 Other fatigue: Secondary | ICD-10-CM

## 2020-10-31 DIAGNOSIS — M79602 Pain in left arm: Secondary | ICD-10-CM | POA: Diagnosis not present

## 2020-10-31 DIAGNOSIS — R55 Syncope and collapse: Secondary | ICD-10-CM | POA: Diagnosis not present

## 2020-10-31 DIAGNOSIS — E785 Hyperlipidemia, unspecified: Secondary | ICD-10-CM

## 2020-10-31 DIAGNOSIS — I2510 Atherosclerotic heart disease of native coronary artery without angina pectoris: Secondary | ICD-10-CM

## 2020-10-31 DIAGNOSIS — E669 Obesity, unspecified: Secondary | ICD-10-CM

## 2020-10-31 DIAGNOSIS — I10 Essential (primary) hypertension: Secondary | ICD-10-CM

## 2020-10-31 DIAGNOSIS — Z87898 Personal history of other specified conditions: Secondary | ICD-10-CM | POA: Diagnosis not present

## 2020-10-31 DIAGNOSIS — R001 Bradycardia, unspecified: Secondary | ICD-10-CM

## 2020-10-31 DIAGNOSIS — E66811 Obesity, class 1: Secondary | ICD-10-CM

## 2020-10-31 NOTE — Patient Instructions (Signed)
Medication Instructions:  Continue current medications  *If you need a refill on your cardiac medications before your next appointment, please call your pharmacy*   Lab Work: TSH  If you have labs (blood work) drawn today and your tests are completely normal, you will receive your results only by: [name] MyChart Message (if you have MyChart) OR . A paper copy in the mail If you have any lab test that is abnormal or we need to change your treatment, we will call you to review the results.   Testing/Procedures: Your physician has requested that you have an echocardiogram. Echocardiography is a painless test that uses sound waves to create images of your heart. It provides your doctor with information about the size and shape of your heart and how well your heart's chambers and valves are working. This procedure takes approximately one hour. There are no restrictions for this procedure.   Follow-Up: At [facility], you and your health needs are our priority.  As part of our continuing mission to provide you with exceptional heart care, we have created designated Provider Care Teams.  These Care Teams include your primary Cardiologist (physician) and Advanced Practice Providers (APPs -  Physician Assistants and Nurse Practitioners) who all work together to provide you with the care you need, when you need it.  We recommend signing up for the patient portal called "MyChart".  Sign up information is provided on this After Visit Summary.  MyChart is used to connect with patients for Virtual Visits (Telemedicine).  Patients are able to view lab/test results, encounter notes, upcoming appointments, etc.  Non-urgent messages can be sent to your provider as well.   To learn more about what you can do with MyChart, go to [email or web address].    Your next appointment:   1 month(s)  The format for your next appointment:   In Person  Provider:   You may see [name], MD or one of the  following Advanced Practice Providers on your designated Care Team:    Rosaria Ferries, PA-C  [name], DNP, ANP

## 2020-11-01 LAB — TSH: TSH: 2.48 u[IU]/mL (ref 0.450–4.500)

## 2020-11-06 ENCOUNTER — Other Ambulatory Visit: Payer: Self-pay | Admitting: Cardiology

## 2020-11-09 ENCOUNTER — Ambulatory Visit: Payer: Managed Care, Other (non HMO) | Admitting: Student

## 2020-11-29 ENCOUNTER — Other Ambulatory Visit: Payer: Self-pay | Admitting: Cardiology

## 2020-12-08 ENCOUNTER — Ambulatory Visit (HOSPITAL_COMMUNITY): Payer: Medicare Other | Attending: Cardiology

## 2020-12-08 ENCOUNTER — Other Ambulatory Visit: Payer: Self-pay

## 2020-12-08 DIAGNOSIS — R55 Syncope and collapse: Secondary | ICD-10-CM | POA: Insufficient documentation

## 2020-12-08 HISTORY — PX: TRANSTHORACIC ECHOCARDIOGRAM: SHX275

## 2020-12-08 LAB — ECHOCARDIOGRAM COMPLETE
Area-P 1/2: 2.62 cm2
S' Lateral: 3 cm

## 2021-01-02 ENCOUNTER — Ambulatory Visit: Payer: Managed Care, Other (non HMO) | Admitting: Cardiology

## 2021-01-20 HISTORY — PX: OTHER SURGICAL HISTORY: SHX169

## 2021-02-08 ENCOUNTER — Ambulatory Visit (INDEPENDENT_AMBULATORY_CARE_PROVIDER_SITE_OTHER): Payer: Medicare Other | Admitting: Cardiology

## 2021-02-08 ENCOUNTER — Ambulatory Visit (INDEPENDENT_AMBULATORY_CARE_PROVIDER_SITE_OTHER): Payer: Medicare Other

## 2021-02-08 ENCOUNTER — Other Ambulatory Visit: Payer: Self-pay

## 2021-02-08 ENCOUNTER — Encounter: Payer: Self-pay | Admitting: Cardiology

## 2021-02-08 VITALS — BP 126/82 | HR 56 | Ht 67.0 in | Wt 212.4 lb

## 2021-02-08 DIAGNOSIS — I2510 Atherosclerotic heart disease of native coronary artery without angina pectoris: Secondary | ICD-10-CM

## 2021-02-08 DIAGNOSIS — R001 Bradycardia, unspecified: Secondary | ICD-10-CM

## 2021-02-08 DIAGNOSIS — Z9884 Bariatric surgery status: Secondary | ICD-10-CM

## 2021-02-08 DIAGNOSIS — Z9861 Coronary angioplasty status: Secondary | ICD-10-CM

## 2021-02-08 DIAGNOSIS — I25119 Atherosclerotic heart disease of native coronary artery with unspecified angina pectoris: Secondary | ICD-10-CM | POA: Diagnosis not present

## 2021-02-08 DIAGNOSIS — R5383 Other fatigue: Secondary | ICD-10-CM

## 2021-02-08 DIAGNOSIS — R002 Palpitations: Secondary | ICD-10-CM

## 2021-02-08 DIAGNOSIS — Z955 Presence of coronary angioplasty implant and graft: Secondary | ICD-10-CM

## 2021-02-08 DIAGNOSIS — R0600 Dyspnea, unspecified: Secondary | ICD-10-CM

## 2021-02-08 DIAGNOSIS — E785 Hyperlipidemia, unspecified: Secondary | ICD-10-CM

## 2021-02-08 DIAGNOSIS — F418 Other specified anxiety disorders: Secondary | ICD-10-CM

## 2021-02-08 DIAGNOSIS — R0609 Other forms of dyspnea: Secondary | ICD-10-CM

## 2021-02-08 DIAGNOSIS — I10 Essential (primary) hypertension: Secondary | ICD-10-CM

## 2021-02-08 NOTE — Progress Notes (Unsigned)
Patient enrolled for [employer] to mail a 14 day ZIO XT to address on file.

## 2021-02-08 NOTE — Patient Instructions (Signed)
Medication Instructions:    Wean off Bystolic - take 1/2 tablet daily for week then stop   In one month - Aug 20 , 2022-  stop taking cholesterol  medication - to see if symptoms are better.  *If you need a refill on your cardiac medications before your next appointment, please call your pharmacy*   Lab Work: Not needed    Testing/Procedures: Will be mailed to you in 5 to 10 days Your physician has recommended that you wear a holter monitor 14 days Zio . Holter monitors are medical devices that record the heart's electrical activity. Doctors most often use these monitors to diagnose arrhythmias. Arrhythmias are problems with the speed or rhythm of the heartbeat. The monitor is a small, portable device. You can wear one while you do your normal daily activities. This is usually used to diagnose what is causing palpitations/syncope (passing out).    Follow-Up: At [facility], you and your health needs are our priority.  As part of our continuing mission to provide you with exceptional heart care, we have created designated Provider Care Teams.  These Care Teams include your primary Cardiologist (physician) and Advanced Practice Providers (APPs -  Physician Assistants and Nurse Practitioners) who all work together to provide you with the care you need, when you need it.     Your next appointment:   4 month(s)  The format for your next appointment:   In Person  Provider:   [name], MD   Other Instructions  ZIO XT- Long Term Monitor Instructions  Your physician has requested you wear a ZIO patch monitor for 14 days.  This is a single patch monitor. [employer] supplies one patch monitor per enrollment. Additional stickers are not available. Please do not apply patch if you will be having a Nuclear Stress Test,  Echocardiogram, Cardiac CT, MRI, or Chest Xray during the period you would be wearing the  monitor. The patch cannot be worn during these tests. You cannot remove and  re-apply the  ZIO XT patch monitor.  Your ZIO patch monitor will be mailed 3 day USPS to your address on file. It may take 3-5 days  to receive your monitor after you have been enrolled.  Once you have received your monitor, please review the enclosed instructions. Your monitor  has already been registered assigning a specific monitor serial # to you.  Billing and Patient Assistance Program Information  We have supplied [employer] with any of your insurance information on file for billing purposes. [employer] offers a sliding scale Patient Assistance Program for patients that do not have  insurance, or whose insurance does not completely cover the cost of the ZIO monitor.  You must apply for the Patient Assistance Program to qualify for this discounted rate.  To apply, please call [employer] at [phone], select option 4, select option 2, ask to apply for  Patient Assistance Program. Theodore Demark will ask your household income, and how many people  are in your household. They will quote your out-of-pocket cost based on that information.  [employer] will also be able to set up a 55-month, interest-free payment plan if needed.  Applying the monitor   Shave hair from upper left chest.  Hold abrader disc by orange tab. Rub abrader in 40 strokes over the upper left chest as  indicated in your monitor instructions.  Clean area with 4 enclosed alcohol pads. Let dry.  Apply patch as indicated in monitor instructions. Patch will be placed under collarbone on  left  side of chest with arrow pointing upward.  Rub patch adhesive wings for 2 minutes. Remove white label marked "1". Remove the white  label marked "2". Rub patch adhesive wings for 2 additional minutes.  While looking in a mirror, press and release button in center of patch. A small green light will  flash 3-4 times. This will be your only indicator that the monitor has been turned on.  Do not shower for the first 24 hours. You may shower after the  first 24 hours.  Press the button if you feel a symptom. You will hear a small click. Record Date, Time and  Symptom in the Patient Logbook.  When you are ready to remove the patch, follow instructions on the last 2 pages of Patient  Logbook. Stick patch monitor onto the last page of Patient Logbook.  Place Patient Logbook in the blue and white box. Use locking tab on box and tape box closed  securely. The blue and white box has prepaid postage on it. Please place it in the mailbox as  soon as possible. Your physician should have your test results approximately 7 days after the  monitor has been mailed back to [facility].  Call Melfa at [phone] if you have questions regarding  your ZIO XT patch monitor. Call them immediately if you see an orange light blinking on your  monitor.  If your monitor falls off in less than 4 days, contact our Monitor department at [phone].  If your monitor becomes loose or falls off after 4 days call [employer] at [phone] for  suggestions on securing your monitor

## 2021-02-08 NOTE — Progress Notes (Signed)
Primary Care Provider: Leonard Downing, MD Cardiologist: [name], MD Electrophysiologist: None  Clinic Note: Chief Complaint  Patient presents with   Follow-up    3-27-month   Coronary Artery Disease    Not really having angina.  Has had recent stress test and echocardiogram reviewed.   Fatigue    Fatigue and near syncope, has had bradycardia issues.     ===================================  ASSESSMENT/PLAN   Problem List Items Addressed This Visit     DOE (dyspnea on exertion)    Now recently evaluated yet again with another stress test that was nonischemic.  Normal EF on echo.  Difficult to blame her exertional dyspnea on cardiac issues.  Weaning off beta-blocker to allow for more heart rate responsiveness, but also trying statin holiday for 1 month to determine if statin can be exacerbating symptoms.       CAD S/P DES PCI-proximal LAD (Chronic)    2 overlapping stents in the LAD - On Maintenance Plavix 75 mg Okay to hold Plavix 5 days preop for surgeries or procedures.  (7 days for high risk procedures-neurologic/spinal)       Coronary artery disease involving native coronary artery of native heart with angina pectoris ([place]) - Primary (Chronic)    She has had multiple episodes of symptoms that are somewhat concerning since her initial PCI, the first time she clearly had a proximal stent edge dissection that was treated with an overlapping stent.  Since then, she has had 2 negative stress tests with the one being recently at [facility]. ->  We will try to get those records.    Since her stress test was negative, my inclination is that she is not ischemic from the RCA/PL branch, and the LAD is doing well.  Plan: Because of fatigue, bradycardia and potential contributing, it is, we are stopping Bystolic-reassess and consider amlodipine. Currently on statin but we are going to try for 1 month statin holiday to reassess. On maintenance dose Plavix  without aspirin. ->  2 overlapping stents in the LAD.      Relevant Orders   LONG TERM MONITOR (3-14 DAYS)   Essential (primary) hypertension (Chronic)    Blood pressure actually is very well controlled on current meds, we are stopping Bystolic, and we may need to theoretically add dihydropyridine calcium channel blocker such as amlodipine which may actually be beneficial, antianginal standpoint.  We will reassess pressures off of Bystolic-on Hyzaar at her follow-up visit in 4 months -> if BP increases, we can simply double her Hyzaar dose or consider adding amlodipine.       Hyperlipidemia with target LDL less than 70 (Chronic)    Labs as of December 2021 were excellent.  She is due to have labs rechecked soon.  She is on rosuvastatin, but with fatigue and unsteadiness, along with memory issues and concerned that there may be some side effect of statin.  We will have her start a 1 month statin holiday as of August 20 this way we do not confuse changes symptoms being off Bystolic).  From August 20 through September 20, she will stay off of statin and reassess symptoms.  If no improvement of memory issues or fatigue, she can go back on rosuvastatin.  Otherwise we will need to look for other options to treat. ->   May be best to hold off on rechecking labs until after she has held her statin.       Depression with anxiety  I think some of her symptoms may be super manifestations of anxiety.  Symptoms seem to increase in themselves.       Fatigue    She had fatigue last year and eventually weaned off her diltiazem.  She is now still on Bystolic and still having fatigue. Plan, wean off Bystolic.  After 1 month she will do his 1 month statin holiday to assess symptoms being off statin as well.  Otherwise, I think this could potentially be related to depression type symptoms.       S/P gastric bypass    I wonder if some of her extremity symptoms are potentially related to gastric  outlet phenomena.  I know she is easily dehydrated, that could have explain her near syncope.  We will       Rapid palpitations (Chronic)    Was having significant palpitations last summer - was on combination of diltiazem (CCB) & (BB)Bystolic --> no longer on CCB & we are now weaning off BB.  Monitor for recurrence.  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       Sinus bradycardia (Chronic)    She was already off diltiazem, somehow it was still on her list.  She still having bradycardia and no longer having tachycardia.  At this point she is on very low-dose Bystolic, but with having fatigue and bradycardia as well as near syncope, I think we just need to allow her more heart rate responsiveness.  Plan: Wean off Bystolic-take 1/2 tablet daily for a week and then stop. Since she did have history of palpitations and is now having bradycardia, I want her to wear a 14-day Zio patch to assess for arrhythmia as well as heart rates and responsiveness.       Relevant Orders   LONG TERM MONITOR (3-14 DAYS)    ===================================  HPI:    Carrie Reynolds is a 65 y.o. female with a PMH below who presents today for ~3 month f/u having recently contracted COVID-19 (in May).  CAD History: 05/11/20: DES PCI pLAD Resolute Onyx DES 3.5 x 8. 60% PDA. Recurrent Sx - Abnormal Myoview - anterior ischemia --> relook Cath Cath - PCI 07/01/20: 70% pre-stent prox edge dissection,  patent p-mLAD stent, 55% SP1 (jailed by PCI). mLAD 25%. RI 30%, RPAV 60%.  --> 70% prox LAD = covered with 2nd Resolute Onyx DES 3.5 x 8 mm -- 3.8 mm).   F/u Myoview 10/2019: EF 55-60%. Small sized fixed apical defect - NO ISCHEMIA.  LOW RISK. 10/24/2020 - Nuclear ST ([place], [place]): by Report - non-ischemic.    I last saw Carrie Reynolds in December 2021.  She did not really feel well after having started Ozempic.  GI was upset and she had poor energy but was doing well from a cardiac standpoint. Ozempic  stopped.  Recent Hospitalizations:  Early April 2022: [facility] - CP/ Arm Pain & Near Syncope  Anneth Brunell was last seen on 10/31/2020 by Sande Rives, PA for evaluation of left arm pain and near syncope. => L-sided arm pain, middle of forearm -> radiating to chest & R neck. Alleviated by squeezing arm.  NOT like prior Angina.  -> Also noted an episode of SOB & Near Syncope (tunnel vision / clammy, dizzy) while walking [place] the beach.  Not associated with palpitations, but ~ some L  Arm pain w/o Chest Pain. => She felt like this was similar to her prior symptoms prior to  PCI she went to [facility]  (Records Requested) where she was evaluated with ST, given IVF. -> Diltiazem stopped (HR noted to be in 40s).  --> No further episodes of Near Syncope - but still notes feeling unstable, off balance.  No PND/orthopnea. Echo ordered. Bystolic continued, but Diltiazem still off.    COVID test + Dec 02, 2020.  Reviewed  CV studies:    The following studies were reviewed today: (if available, images/films reviewed: From Epic Chart or Care Everywhere) TTE 12/08/2020: Normal LV Fxn - EF 60-65%. No RWMA. Mod Asymmetric Basal Septa LVH with mild Concentric LVH. Gr II DD. Normal Longitudinal Strain. Normal RV size & fxn - normal RAP/CVP.  Normal MV. Mild AoV sclerosis w/ stenosis. Mild Ascending Ao dilation - ~38 mm (borderline).  => No change from 04/2017.    Interval History:   Rhyder Bratz returns today still noticing some fatigue and dizziness.  Overall she is feeling better.  She is trying to the best she can staying adequately hydrated.  She just feels like she gets worn out before the end of the day.  She is having a headache at least once or twice a week associated lightheadedness and nausea.  Balance is improved a little bit.  She also has been having some memory issues feeling somewhat foggy headed.  She has not really noted much more of the left arm or  chest discomfort in the last couple weeks.  Just little fatigue and maybe some mild exertional dyspnea.  No get up and go.  Easily fatigued.  CV Review of Symptoms (Summary) Cardiovascular ROS: positive for - dyspnea on exertion and exercise intolerance/fatigue.  Lightheadedness and dizziness with near syncope, but that has improved.  Headaches negative for - chest pain, edema, irregular heartbeat, loss of consciousness, orthopnea, palpitations, paroxysmal nocturnal dyspnea, rapid heart rate, or TIA or amaurosis fugax, claudication  REVIEWED OF SYSTEMS   Review of Systems  Constitutional:  Positive for malaise/fatigue. Negative for weight loss (She is having a hard time keeping weight off.).  HENT:  Negative for nosebleeds and sinus pain.   Respiratory:  Negative for cough and shortness of breath (Only with prominent exertion).   Cardiovascular:  Positive for chest pain (Still atypical.).  Gastrointestinal:  Negative for blood in stool.  Genitourinary:  Negative for hematuria.  Musculoskeletal:  Positive for joint pain. Negative for back pain and falls.  Skin: Negative.   Neurological:  Positive for dizziness (Intermittent, but not significant). Negative for weakness.  Psychiatric/Behavioral:  Negative for depression (May be somewhat downloaded to lab but overall stable.) and memory loss. The patient does not have insomnia.    I have reviewed and (if needed) personally updated the patient's problem list, medications, allergies, past medical and surgical history, social and family history.   PAST MEDICAL HISTORY   Past Medical History:  Diagnosis Date   Anxiety    Arnold-Chiari malformation ([place]) 2006   CAD S/P PCI 05/06/2019   05/06/2019: prox LAD (@ SPI) PCI with Resolute Onyx DES 3.5  x 8, Residual 60% PDA, Normal LVF; 07/02/2019: relook Cath for Anterior Ischemia on Mhoview --> CATH 70% pre-stent/proximal edge dissection --> proximal overlapping DES (Resolute Onyx 3.5 x 8 - new & old  stent post-dilated to 3.8 mm)   Cervical strain    syndrome   Cervicogenic headache    Concussion with loss of consciousness 10/09/2017   CTS (carpal tunnel syndrome)    Depression    GERD (  gastroesophageal reflux disease)    Glucose intolerance (impaired glucose tolerance)    High coronary artery calcium score    Score of 1460.  Coronary CTA shows high-grade proximal-mid LAD stenosis (CT FFR 0.50).  Also PDA/PLA 60 to 70% stenosis (CTO FFR 0.75)   History of kidney stones    Hypertension    Migraine headache    with visual aura   Obesity    OSA (obstructive sleep apnea)    not treated   Postmenopausal    Vertigo    post concussive    PAST SURGICAL HISTORY   Past Surgical History:  Procedure Laterality Date   BUNIONECTOMY     right great toe   CHOLECYSTECTOMY N/A 06/05/2018   Procedure: LAPAROSCOPIC CHOLECYSTECTOMY WITH POSSIBLE INTRAOPERATIVE CHOLANGIOGRAM;  Surgeon: Erroll Luna, MD;  Location: [place];  Service: General;  Laterality: N/A;   CORONARY STENT INTERVENTION N/A 05/12/2019   Procedure: CORONARY STENT INTERVENTION;  Surgeon: Leonie Man, MD;  Location: [place] [facility];  Service: Cardiovascular;;; Culprit-p-mLAD 95% 950% SP1) --> Scoring Balloon PTCA -> DES PCI (Resolute DES 3.5 x 38 - 3.8 mm; 0% LAD& 55% SP1). -->  SP1 was somewhat jailed with TIMI II flow post PCI.   CORONARY STENT INTERVENTION N/A 07/02/2019   Procedure: CORONARY STENT INTERVENTION;  Surgeon: Leonie Man, MD;  Location: [place] [facility];  Service: Cardiovascular;; * 70% Proximal stent edge dissection (edge of previous stent that is patent) -->  successfully covered with additional RESOLUTE ONYX DES 3.5 mm x 8 mm overlapping proximal edge of previous stent (postdilated to 3.8 mm)   GASTRIC BYPASS  11/2015   [facility]   GYNECOLOGIC CRYOSURGERY     HAND SURGERY     LEFT HEART CATH AND CORONARY ANGIOGRAPHY N/A 05/12/2019   Procedure: LEFT HEART CATH AND CORONARY ANGIOGRAPHY;  Surgeon:  Leonie Man, MD;  Location: [place] [facility];  Service: Cardiovascular;;; Culprit-p-mLAD 95% 950% SP1) --> DES PCI.;  RPAV 60% -small caliber vessel, did not appear flow-limiting (medical management).  EF 55 to 60%.  Normal LVEDP.   LEFT HEART CATH AND CORONARY ANGIOGRAPHY N/A 07/02/2019   Procedure: LEFT HEART CATH AND CORONARY ANGIOGRAPHY;  Surgeon: Leonie Man, MD;  Location: [place] [facility];  Service: Cardiovascular;; CULPRIT = ~70% proximal edge dissection of previous stent (DES PCI overlapping stent placed) - jails SP1 w/ ~60-70% ostial stenosis.[name] mLAD 25%. RI 30%, RPAV 60%.   NM MYOVIEW LTD  06/24/2019   [facility]): HIGH RISK.  EF 55%.  Large size, moderate severe defect in the anterior wall with associated anterior hypokinesis.  Suspect LAD territory.   NM MYOVIEW LTD  10/23/2019   Normal EF 55 to 60%.  No EKG changes.  Small size mild severity fixed defect in the apical wall consistent with breast attenuation versus small prior infarct.  Marked improvement from anterior apical perfusion abnormality seen in February 2020.   TRANSTHORACIC ECHOCARDIOGRAM  12/08/2020   Normal LV Fxn - EF 60-65%. No RWMA. Mod Asymmetric Basal Septa LVH with mild Concentric LVH. Gr II DD. Normal Longitudinal Strain. Normal RV size & fxn - normal RAP/CVP.  Normal MV. Mild AoV sclerosis w/ stenosis. Mild Ascending Ao dilation - ~38 mm (borderline).  => No change from 04/2017.   TUBAL LIGATION Bilateral    VAGINAL HYSTERECTOMY  2003   with LSO and right salpingectomy; right ovary still present   Relook CATH-PCI 07/02/2019: proxLAD proximal edge dissection - covered with 2nd Resolute  Onyx DES 3.5 x 8 (post-dilated to 3.8 mm)   Immunization History  Administered Date(s) Administered   Influenza Inj Mdck Quad Pf 04/03/2018   Influenza,inj,Quad PF,6+ Mos 04/08/2017   Janssen (J&J) SARS-COV-2 Vaccination 09/29/2019   Td 07/23/2008   Tdap 05/24/2019   Zoster Recombinat (Shingrix) 04/03/2018     MEDICATIONS/ALLERGIES   Current Meds  Medication Sig   Cholecalciferol (VITAMIN D3) 125 MCG (5000 UT) TABS Take 5,000 Units by mouth every morning.    clopidogrel (PLAVIX) 75 MG tablet TAKE 1 TABLET BY MOUTH EVERY DAY   losartan-hydrochlorothiazide (HYZAAR) 100-12.5 MG tablet Take 0.5 tablets by mouth daily.   Multiple Vitamins-Minerals (ONE-A-DAY WOMENS 50+ ADVANTAGE PO) Take by mouth.   nitroGLYCERIN (NITROSTAT) 0.4 MG SL tablet Place 1 tablet (0.4 mg total) under the tongue every 5 (five) minutes as needed.   pantoprazole (PROTONIX) 40 MG tablet Take 40 mg by mouth daily.   rosuvastatin (CRESTOR) 40 MG tablet Take by mouth.   sertraline (ZOLOFT) 50 MG tablet Take 50 mg by mouth daily.   Nebivolol (BYSTOLIC) 2.5 MG  tablet Take 1 tablet by mouth daily    Allergies  Allergen Reactions   Ceclor [Cefaclor] Hives and Rash   Tetracyclines & Related Hives   Lipitor [Atorvastatin Calcium] Cough   Penicillins Swelling and Other (See Comments)    Has patient had a PCN reaction causing immediate rash, facial/tongue/throat swelling, SOB or lightheadedness with hypotension: No Has patient had a PCN reaction causing severe rash involving mucus membranes or skin necrosis: No Has patient had a PCN reaction that required hospitalization: Yes Has patient had a PCN reaction occurring within the last 10 years: No If all of the above answers are "NO", then may proceed with Cephalosporin use.   Amoxicillin Rash   Ampicillin Rash   Lisinopril Cough   Simvastatin Cough    SOCIAL HISTORY/FAMILY HISTORY   Reviewed in Epic:  Pertinent findings:  Social History   Tobacco Use   Smoking status: Former    Packs/day: 0.10    Years: 30.00    Pack years: 3.00    Types: Cigarettes    Quit date: 04/26/2012    Years since quitting: 8.8   Smokeless tobacco: Never  Vaping Use   Vaping Use: Never used  Substance Use Topics   Alcohol use: Yes    Comment: occasional   Drug use: No   Social  History   Social History Narrative   ** Merged History Encounter **       Lives with husband [name]). She travels very frequently with her job and is gone most weeks out of the month. She is active and exercises when able.    OBJCTIVE -PE, EKG, labs   Wt Readings from Last 3 Encounters:  02/08/21 212 lb 6.4 oz (96.3 kg)  10/31/20 206 lb 6.4 oz (93.6 kg)  06/28/20 203 lb (92.1 kg)    Physical Exam: BP 126/82   Pulse (!) 56   Ht 5\' 7"  (1.702 m)   Wt 212 lb 6.4 oz (96.3 kg)   LMP 07/24/2003 (Within Months)   SpO2 99%   BMI 33.27 kg/m  Physical Exam   Adult ECG Report N/a  Recent Labs:  due for labs!  Lab Results  Component Value Date   CHOL 138 06/24/2020   HDL 60 06/24/2020   LDLCALC 53 06/24/2020   TRIG 151 (H) 06/24/2020   CHOLHDL 2.3 06/24/2020   Lab Results  Component Value Date   CREATININE  0.83 06/24/2020   BUN 18 06/24/2020   NA 141 06/24/2020   K 4.4 06/24/2020   CL 104 06/24/2020   CO2 25 06/24/2020   CBC Latest Ref Rng & Units 10/23/2019 07/01/2019 05/06/2019  WBC 3.4 - 10.8 x10E3/uL 5.1 6.4 7.6  Hemoglobin 11.1 - 15.9 g/dL 13.7 13.5 13.6  Hematocrit 34.0 - 46.6 % 40.3 39.6 40.1  Platelets 150 - 450 x10E3/uL 183 187 206    Lab Results  Component Value Date   TSH 2.480 10/31/2020    ==================================================  COVID-19 Education: The signs and symptoms of COVID-19 were discussed with the patient and how to seek care for testing (follow up with PCP or arrange E-visit).    I spent a total of 43 minutes with the patient spent in direct patient consultation.  Additional time spent with chart review  / charting (studies, outside notes, etc): 20 min Total Time: 63 min  Current medicines are reviewed at length with the patient today.  (+/- concerns) ?? Medicine concerns  - slow HR  This visit occurred during the SARS-CoV-2 public health emergency.  Safety protocols were in place, including screening questions prior to the  visit, additional usage of staff PPE, and extensive cleaning of exam room while observing appropriate contact time as indicated for disinfecting solutions.  Notice: This dictation was prepared with Dragon dictation along with smaller phrase technology. Any transcriptional errors that result from this process are unintentional and may not be corrected upon review.  Patient Instructions / Medication Changes & Studies & Tests Ordered   Patient Instructions  Medication Instructions:    Wean off Bystolic - take 1/2 tablet daily for week then stop   In one month - Aug 20 , 2022-  stop taking cholesterol  medication - to see if symptoms are better.  *If you need a refill on your cardiac medications before your next appointment, please call your pharmacy*   Lab Work: Not needed    Testing/Procedures: Will be mailed to you in 5 to 10 days Your physician has recommended that you wear a holter monitor 14 days Zio . Holter monitors are medical devices that record the heart's electrical activity. Doctors most often use these monitors to diagnose arrhythmias. Arrhythmias are problems with the speed or rhythm of the heartbeat. The monitor is a small, portable device. You can wear one while you do your normal daily activities. This is usually used to diagnose what is causing palpitations/syncope (passing out).    Follow-Up: At [facility], you and your health needs are our priority.  As part of our continuing mission to provide you with exceptional heart care, we have created designated Provider Care Teams.  These Care Teams include your primary Cardiologist (physician) and Advanced Practice Providers (APPs -  Physician Assistants and Nurse Practitioners) who all work together to provide you with the care you need, when you need it.     Your next appointment:   4 month(s)  The format for your next appointment:   In Person  Provider:   [name], MD   Other Instructions  ZIO XT- Long  Term Monitor Instructions  Your physician has requested you wear a ZIO patch monitor for 14 days.  This is a single patch monitor. [employer] supplies one patch monitor per enrollment. Additional stickers are not available. Please do not apply patch if you will be having a Nuclear Stress Test,  Echocardiogram, Cardiac CT, MRI, or Chest Xray during the period you would be wearing  the  monitor. The patch cannot be worn during these tests. You cannot remove and re-apply the  ZIO XT patch monitor.  Your ZIO patch monitor will be mailed 3 day USPS to your address on file. It may take 3-5 days  to receive your monitor after you have been enrolled.  Once you have received your monitor, please review the enclosed instructions. Your monitor  has already been registered assigning a specific monitor serial # to you.  Billing and Patient Assistance Program Information  We have supplied [employer] with any of your insurance information on file for billing purposes. [employer] offers a sliding scale Patient Assistance Program for patients that do not have  insurance, or whose insurance does not completely cover the cost of the ZIO monitor.  You must apply for the Patient Assistance Program to qualify for this discounted rate.  To apply, please call [employer] at [phone], select option 4, select option 2, ask to apply for  Patient Assistance Program. Theodore Demark will ask your household income, and how many people  are in your household. They will quote your out-of-pocket cost based on that information.  [employer] will also be able to set up a 30-month, interest-free payment plan if needed.  Applying the monitor   Shave hair from upper left chest.  Hold abrader disc by orange tab. Rub abrader in 40 strokes over the upper left chest as  indicated in your monitor instructions.  Clean area with 4 enclosed alcohol pads. Let dry.  Apply patch as indicated in monitor instructions. Patch will be placed under collarbone on  left  side of chest with arrow pointing upward.  Rub patch adhesive wings for 2 minutes. Remove white label marked "1". Remove the white  label marked "2". Rub patch adhesive wings for 2 additional minutes.  While looking in a mirror, press and release button in center of patch. A small green light will  flash 3-4 times. This will be your only indicator that the monitor has been turned on.  Do not shower for the first 24 hours. You may shower after the first 24 hours.  Press the button if you feel a symptom. You will hear a small click. Record Date, Time and  Symptom in the Patient Logbook.  When you are ready to remove the patch, follow instructions on the last 2 pages of Patient  Logbook. Stick patch monitor onto the last page of Patient Logbook.  Place Patient Logbook in the blue and white box. Use locking tab on box and tape box closed  securely. The blue and white box has prepaid postage on it. Please place it in the mailbox as  soon as possible. Your physician should have your test results approximately 7 days after the  monitor has been mailed back to [facility].  Call [place] at [phone] if you have questions regarding  your ZIO XT patch monitor. Call them immediately if you see an orange light blinking on your  monitor.  If your monitor falls off in less than 4 days, contact our Monitor department at [phone].  If your monitor becomes loose or falls off after 4 days call [employer] at [phone] for  suggestions on securing your monitor     Studies Ordered:   Orders Placed This Encounter  Procedures   LONG TERM MONITOR (3-14 DAYS)     [name], M.D., M.S. Interventional Cardiologist   Pager # [phone] Phone # [phone] [address] [place], [place] [address]  Thank you for choosing [facility] at [facility]!!

## 2021-02-10 DIAGNOSIS — R001 Bradycardia, unspecified: Secondary | ICD-10-CM | POA: Diagnosis not present

## 2021-02-10 DIAGNOSIS — I25119 Atherosclerotic heart disease of native coronary artery with unspecified angina pectoris: Secondary | ICD-10-CM

## 2021-02-17 ENCOUNTER — Encounter: Payer: Self-pay | Admitting: Cardiology

## 2021-02-17 NOTE — Assessment & Plan Note (Signed)
I think some of her symptoms may be super manifestations of anxiety.  Symptoms seem to increase in themselves.

## 2021-02-17 NOTE — Assessment & Plan Note (Signed)
Now recently evaluated yet again with another stress test that was nonischemic.  Normal EF on echo.  Difficult to blame her exertional dyspnea on cardiac issues.  Weaning off beta-blocker to allow for more heart rate responsiveness, but also trying statin holiday for 1 month to determine if statin can be exacerbating symptoms.

## 2021-02-17 NOTE — Assessment & Plan Note (Signed)
   2 overlapping stents in the LAD - On Maintenance Plavix 75 mg  Okay to hold Plavix 5 days preop for surgeries or procedures.  (7 days for high risk procedures-neurologic/spinal)

## 2021-02-17 NOTE — Assessment & Plan Note (Signed)
Was having significant palpitations last summer - was on combination of diltiazem (CCB) & (BB)Bystolic --> no longer on CCB & we are now weaning off BB.   Monitor for recurrence.   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

## 2021-02-17 NOTE — Assessment & Plan Note (Addendum)
Blood pressure actually is very well controlled on current meds, we are stopping Bystolic, and we may need to theoretically add dihydropyridine calcium channel blocker such as amlodipine which may actually be beneficial, antianginal standpoint.  We will reassess pressures off of Bystolic-on Hyzaar at her follow-up visit in 4 months -> if BP increases, we can simply double her Hyzaar dose or consider adding amlodipine.

## 2021-02-17 NOTE — Assessment & Plan Note (Signed)
I wonder if some of her extremity symptoms are potentially related to gastric outlet phenomena.  I know she is easily dehydrated, that could have explain her near syncope.  We will

## 2021-02-17 NOTE — Assessment & Plan Note (Signed)
Labs as of December 2021 were excellent.  She is due to have labs rechecked soon.  She is on rosuvastatin, but with fatigue and unsteadiness, along with memory issues and concerned that there may be some side effect of statin.  We will have her start a 1 month statin holiday as of August 20 this way we do not confuse changes symptoms being off Bystolic).  From August 20 through September 20, she will stay off of statin and reassess symptoms.  If no improvement of memory issues or fatigue, she can go back on rosuvastatin.  Otherwise we will need to look for other options to treat. ->   May be best to hold off on rechecking labs until after she has held her statin.

## 2021-02-17 NOTE — Assessment & Plan Note (Addendum)
She was already off diltiazem, somehow it was still on her list.  She still having bradycardia and no longer having tachycardia.  At this point she is on very low-dose Bystolic, but with having fatigue and bradycardia as well as near syncope, I think we just need to allow her more heart rate responsiveness.  Plan: Wean off Bystolic-take 1/2 tablet daily for a week and then stop.  Since she did have history of palpitations and is now having bradycardia, I want her to wear a 14-day Zio patch to assess for arrhythmia as well as heart rates and responsiveness.

## 2021-02-17 NOTE — Assessment & Plan Note (Signed)
She had fatigue last year and eventually weaned off her diltiazem.  She is now still on Bystolic and still having fatigue. Plan, wean off Bystolic.  After 1 month she will do his 1 month statin holiday to assess symptoms being off statin as well.  Otherwise, I think this could potentially be related to depression type symptoms.

## 2021-02-17 NOTE — Assessment & Plan Note (Addendum)
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  Since then, she has had 2 negative stress tests with the one being recently at [facility]. ->  We will try to get those records.    Since her stress test was negative, my inclination is that she is not ischemic from the RCA/PL branch, and the LAD is doing well.  Plan:  Because of fatigue, bradycardia and potential contributing, it is, we are stopping Bystolic-reassess and consider amlodipine.  Currently on statin but we are going to try for 1 month statin holiday to reassess.  On maintenance dose Plavix without aspirin. ->  2 overlapping stents in the LAD.

## 2021-03-08 ENCOUNTER — Telehealth: Payer: Self-pay | Admitting: *Deleted

## 2021-03-08 NOTE — Telephone Encounter (Signed)
The patient has been notified of the result and verbalized understanding.  All questions (if any) were answered. Raiford Simmonds, RN 03/08/2021 10:49 AM

## 2021-03-08 NOTE — Telephone Encounter (Signed)
-----   Message from Leonie Man, MD sent at 03/01/2021  8:28 AM EDT ----- Cardiac Event Monitor  Narrative & Impression   Predominant rhythm was sinus rhythm with a minimum rate of 42 bpm, maximum 124 bpm. Average 70 bpm.  Rare PVCs and isolated PACs noted. (PVC couplets and triplets noted as well as trigeminy)  No evidence of atrial fibrillation  3 4-5 beat runs of atrial tachycardia ranging from 102-152 bpm.  2 of the 3 atrial tachycardia bursts were noted on diary and patient trigger, otherwise patient triggered events not associated with tachyarrhythmia.  Remainder of patient noted events occurred either with sinus rhythm or sinus rhythm with PACs/PVCs.    Patch Wear Time:  11 days and 23 hours (2022-07-22T21:39:39-0400 to 2022-08-03T20:41:56-0400)   Nothing overly worrisome on monitor.  [name], MD

## 2021-03-08 NOTE — Telephone Encounter (Signed)
Left message  on voicemail per dpr-  detail of monitor results -- any question may call back

## 2021-03-10 ENCOUNTER — Other Ambulatory Visit: Payer: Self-pay | Admitting: Cardiology

## 2021-04-12 ENCOUNTER — Ambulatory Visit (INDEPENDENT_AMBULATORY_CARE_PROVIDER_SITE_OTHER): Payer: Medicare Other | Admitting: Cardiology

## 2021-04-12 ENCOUNTER — Encounter: Payer: Self-pay | Admitting: Cardiology

## 2021-04-12 ENCOUNTER — Other Ambulatory Visit: Payer: Self-pay

## 2021-04-12 VITALS — BP 125/82 | HR 62 | Ht 67.0 in | Wt 214.8 lb

## 2021-04-12 DIAGNOSIS — E785 Hyperlipidemia, unspecified: Secondary | ICD-10-CM

## 2021-04-12 DIAGNOSIS — R001 Bradycardia, unspecified: Secondary | ICD-10-CM | POA: Diagnosis not present

## 2021-04-12 DIAGNOSIS — R002 Palpitations: Secondary | ICD-10-CM

## 2021-04-12 DIAGNOSIS — I2510 Atherosclerotic heart disease of native coronary artery without angina pectoris: Secondary | ICD-10-CM

## 2021-04-12 DIAGNOSIS — Z9861 Coronary angioplasty status: Secondary | ICD-10-CM

## 2021-04-12 DIAGNOSIS — E669 Obesity, unspecified: Secondary | ICD-10-CM

## 2021-04-12 DIAGNOSIS — I10 Essential (primary) hypertension: Secondary | ICD-10-CM

## 2021-04-12 DIAGNOSIS — I25119 Atherosclerotic heart disease of native coronary artery with unspecified angina pectoris: Secondary | ICD-10-CM | POA: Diagnosis not present

## 2021-04-12 DIAGNOSIS — R0600 Dyspnea, unspecified: Secondary | ICD-10-CM

## 2021-04-12 DIAGNOSIS — Z955 Presence of coronary angioplasty implant and graft: Secondary | ICD-10-CM

## 2021-04-12 DIAGNOSIS — R0609 Other forms of dyspnea: Secondary | ICD-10-CM

## 2021-04-12 DIAGNOSIS — E66811 Obesity, class 1: Secondary | ICD-10-CM

## 2021-04-12 MED ORDER — PROPRANOLOL HCL 10 MG PO TABS: ORAL_TABLET | ORAL | 3 refills | Status: DC

## 2021-04-12 NOTE — Progress Notes (Signed)
Primary Care Provider: Leonard Downing, MD Cardiologist: [name], MD Electrophysiologist: None  Clinic Note: Chief Complaint  Patient presents with   Follow-up    2 months.  Monitor follow-up   Coronary Artery Disease    No angina   Palpitations    Still having intermittent episodes.  Monitor reviewed.    ===================================  ASSESSMENT/PLAN   Problem List Items Addressed This Visit       Cardiology Problems   CAD S/P DES PCI-proximal LAD (Chronic)    Now 2 overlapping DES stents in the LAD.  On maintenance monotherapy Plavix 75 mg daily.  Okay to hold Plavix 5-7 days preop for surgery procedures.  (7 days for high risk procedures)..      Relevant Medications   propranolol (INDERAL) 10 MG tablet   Coronary artery disease involving native coronary artery of native heart with angina pectoris ([place]) - Primary (Chronic)    SinceNot really having any further episodes of angina her most recent PCI in December 2020.  She had a Myoview done April 2021 that was nonischemic.  She has some off-and-on atypical sounding chest pain symptoms, but what she is feeling now is more of a palpitation issue.  We stopped her beta-blocker and calcium channel blocker because of fatigue issues.  Plan:  Continue maintenance  monotherapy with clopidogrel along with statin. ->   Okay to hold Plavix 5-7 days preop for surgery or procedures. On ARB and HCTZ.  Low threshold to consider calcium channel blocker if additional blood pressure control is needed.      Relevant Medications   propranolol (INDERAL) 10 MG tablet   Other Relevant Orders   CBC   Lipid panel   Comprehensive metabolic panel   Essential (primary) hypertension (Chronic)    Blood pressure looks pretty well controlled off of Bystolic. Only on losartan-HCTZ.  Thankfully, she does have enough blood pressure room to use a PRN propranolol for palpitations.      Relevant Medications   propranolol  (INDERAL) 10 MG tablet   Hyperlipidemia with target LDL less than 70 (Chronic)    Labs were last checked in December.  Well-controlled with exception of borderline elevated triglycerides. She is back on statin having not really noted a difference with short statin holiday.  Better off with holding beta-blocker.  Plan: Continue current dose of rosuvastatin and check follow-up labs including lipid panel, CMP and CBC.      Relevant Medications   propranolol (INDERAL) 10 MG tablet   Other Relevant Orders   CBC   Lipid panel   Comprehensive metabolic panel   Sinus bradycardia (Chronic)    Baseline heart rate 62 bpm off of beta-blocker.  Unfortunately she does have palpitations with PACs mostly.  Plan: Off of Bystolic now with improved energy and better heart rate control.  We will use PRN propranolol which is short acting, for palpitations.      Relevant Medications   propranolol (INDERAL) 10 MG tablet     Other   DOE (dyspnea on exertion)    This was probably related to some chronotropic incompetence on beta-blocker.  Beta-blocker now discontinued and energy level improving.      Relevant Orders   CBC   Lipid panel   Comprehensive metabolic panel   Obesity (BMI 30.0-34.9) (Chronic)    Did not tolerate Ozempic. Hopefully now that her energy level improving, she can work on increasing exercise and lose weight with adjusting diet and increasing exercise.  Relevant Orders   CBC   Lipid panel   Comprehensive metabolic panel   Rapid palpitations (Chronic)    Still having off-and-on palpitations.  No longer on Bystolic which was keeping his a day.  Now we discussed options of either going back on a standing dose versus using PRN p short acting beta-blocker (propranolol 10 mg).  I do think that these are premature anxiety driven and a beta-blocker would be reasonable to use as a PRN.  She does not want to use Zoloft.  Propranolol  10 mg - may take 5 mg ( 1/2 tablet)  to 10 mg  whole tablet)   as needed for anxiety  up to twice a day       Relevant Orders   CBC   Lipid panel   Comprehensive metabolic panel   ===================================  HPI:    Carrie Reynolds is a 65 y.o. female with a PMH below who presents today for ~3 month f/u having recently contracted COVID-41 (in May).  CAD History: 05/11/20: DES PCI pLAD Resolute Onyx DES 3.5 x 8. 60% PDA. Recurrent Sx - Abnormal Myoview - anterior ischemia --> relook Cath Cath - PCI 07/02/19: 70% pre-stent prox edge dissection,  patent p-mLAD stent, 55% SP1 (jailed by PCI). mLAD 25%. RI 30%, RPAV 60%.  --> 70% prox LAD = covered with 2nd Resolute Onyx DES 3.5 x 8 mm -- 3.8 mm).   F/u Myoview 10/2019: EF 55-60%. Small sized fixed apical defect - NO ISCHEMIA.  LOW RISK. 10/24/2020 - Nuclear ST ([place], [place]): by Report - non-ischemic.  DM-2 - Dec 2021 - did not feel well after starting Ozempic. - GI upset.  April 2022 - ER visit to [facility] Arm Pain & Near Syncope -> thought to be dehydration & Diltiazem stopped (HR noted to be in 40s).  Post-Hosp f/u with Sande Rives, PA-C-- noted L Arm pain & Near Syncope -> feeling better, no further episodes, just poor balance.  NO CHF: Echo ordered. Continued Bystolic, but d/c Diltiazem. COVID test + Dec 02, 2020.   Carrie Reynolds was last seen on Seen 01/2021: Still has some fatigue and dizziness, but overall feeling better.  Working hard to stay better hydrated.  Easily fatigued, tired at the end of the day.  Twice a week headache.  Balance better.  Some foggy headedness and memory issues.  No further left arm or chest discomfort. ->  Still noted exercise intolerance.  Recent Hospitalizations:  Early April 2022:   Reviewed  CV studies:    The following studies were reviewed today: (if available, images/films reviewed: From Epic Chart or Care Everywhere) TTE 12/08/2020: Normal LV Fxn - EF 60-65%. No RWMA. Mod  Asymmetric Basal Septa LVH with mild Concentric LVH. Gr II DD. Normal Longitudinal Strain. Normal RV size & fxn - normal RAP/CVP.  Normal MV. Mild AoV sclerosis w/ stenosis. Mild Ascending Ao dilation - ~38 mm (borderline).  => No change from 04/2017.  Event Monitor: Predominantly sinus rhythm with heart rate range of 42 to 124 bpm.  Average 70 bpm.  Rare PVCs (isolated and couplets/triplets), along with isolated PACs.  3 short bursts of 405 beats PACs/atrial tachycardia ranging from 102-152 bpm.  2 of these were noted on patient trigger.    Interval History:   Carrie Reynolds returns today overall feeling a whole lot better.  She says that she has been having some issues with anxiety  and that tends to be when she feels her heart rate going fast.  She feels a fluttering sensation in her chest that is associate with shortness of breath.  All these episodes are somewhat short-lived. She actually there is no longer on Bystolic.[name]  No further episodes of syncope or near syncope.  Energy level is improving.  Less fatigue.  But she still has some balance issues and gets short of breath if she overexerts.  She is starting to get back into her routine exercise level.  CV Review of Symptoms (Summary) Cardiovascular ROS: positive for - dyspnea on exertion and exercise intolerance/fatigue.  Very infrequent episodes of lightheadedness or dizziness. [name]  Headaches negative for - chest pain, edema, irregular heartbeat, loss of consciousness, orthopnea, palpitations, paroxysmal nocturnal dyspnea, rapid heart rate, or TIA or amaurosis fugax, near syncope.  Claudication  REVIEWED OF SYSTEMS   Review of Systems  Constitutional:  Positive for malaise/fatigue. Negative for weight loss (She is having a hard time keeping weight off.).  HENT:  Negative for nosebleeds and sinus pain.   Respiratory:  Negative for cough and shortness of breath (Only with prominent exertion).   Cardiovascular:  Positive for chest pain  (Still atypical.).  Gastrointestinal:  Negative for blood in stool.  Genitourinary:  Negative for hematuria.  Musculoskeletal:  Positive for joint pain. Negative for back pain and falls.  Skin: Negative.   Neurological:  Positive for dizziness (Intermittent, but not significant). Negative for weakness.  Psychiatric/Behavioral:  Negative for depression (May be somewhat downloaded to lab but overall stable.) and memory loss. The patient does not have insomnia.    I have reviewed and (if needed) personally updated the patient's problem list, medications, allergies, past medical and surgical history, social and family history.   PAST MEDICAL HISTORY   Past Medical History:  Diagnosis Date   Anxiety    Arnold-Chiari malformation ([place]) 2006   CAD S/P PCI 05/06/2019   05/06/2019: prox LAD (@ SPI) PCI with Resolute Onyx DES 3.5  x 8, Residual 60% PDA, Normal LVF; 07/02/2019: relook Cath for Anterior Ischemia on Mhoview --> CATH 70% pre-stent/proximal edge dissection --> proximal overlapping DES (Resolute Onyx 3.5 x 8 - new & old stent post-dilated to 3.8 mm)   Cervical strain    syndrome   Cervicogenic headache    Concussion with loss of consciousness 10/09/2017   CTS (carpal tunnel syndrome)    Depression    GERD (gastroesophageal reflux disease)    Glucose intolerance (impaired glucose tolerance)    High coronary artery calcium score    Score of 1460.  Coronary CTA shows high-grade proximal-mid LAD stenosis (CT FFR 0.50).  Also PDA/PLA 60 to 70% stenosis (CTO FFR 0.75)   History of kidney stones    Hypertension    Migraine headache    with visual aura   Obesity    OSA (obstructive sleep apnea)    not treated   Postmenopausal    Vertigo    post concussive    PAST SURGICAL HISTORY   Past Surgical History:  Procedure Laterality Date   BUNIONECTOMY     right great toe   CHOLECYSTECTOMY N/A 06/05/2018   Procedure: LAPAROSCOPIC CHOLECYSTECTOMY WITH POSSIBLE INTRAOPERATIVE  CHOLANGIOGRAM;  Surgeon: Erroll Luna, MD;  Location: [facility];  Service: General;  Laterality: N/A;   CORONARY STENT INTERVENTION N/A 05/12/2019   Procedure: CORONARY STENT INTERVENTION;  Surgeon: Leonie Man, MD;  Location: [facility];  Service: Cardiovascular;;; Culprit-p-mLAD 95% 950% SP1) -->  Scoring Balloon PTCA -> DES PCI (Resolute DES 3.5 x 38 - 3.8 mm; 0% LAD& 55% SP1). -->  SP1 was somewhat jailed with TIMI II flow post PCI.   CORONARY STENT INTERVENTION N/A 07/02/2019   Procedure: CORONARY STENT INTERVENTION;  Surgeon: Leonie Man, MD;  Location: [facility];  Service: Cardiovascular;; * 70% Proximal stent edge dissection (edge of previous stent that is patent) -->  successfully covered with additional RESOLUTE ONYX DES 3.5 mm x 8 mm overlapping proximal edge of previous stent (postdilated to 3.8 mm)   GASTRIC BYPASS  11/2015   [facility]   GYNECOLOGIC CRYOSURGERY     HAND SURGERY     LEFT HEART CATH AND CORONARY ANGIOGRAPHY N/A 05/12/2019   Procedure: LEFT HEART CATH AND CORONARY ANGIOGRAPHY;  Surgeon: Leonie Man, MD;  Location: [facility];  Service: Cardiovascular;;; Culprit-p-mLAD 95% 950% SP1) --> DES PCI.;  RPAV 60% -small caliber vessel, did not appear flow-limiting (medical management).  EF 55 to 60%.  Normal LVEDP.   LEFT HEART CATH AND CORONARY ANGIOGRAPHY N/A 07/02/2019   Procedure: LEFT HEART CATH AND CORONARY ANGIOGRAPHY;  Surgeon: Leonie Man, MD;  Location: [facility];  Service: Cardiovascular;; CULPRIT = ~70% proximal edge dissection of previous stent (DES PCI overlapping stent placed) - jails SP1 w/ ~60-70% ostial stenosis.[name] mLAD 25%. RI 30%, RPAV 60%.   NM MYOVIEW LTD  06/24/2019   [facility]): HIGH RISK.  EF 55%.  Large size, moderate severe defect in the anterior wall with associated anterior hypokinesis.  Suspect LAD territory.   NM MYOVIEW LTD  10/23/2019   Normal EF 55 to 60%.  No EKG changes.  Small size mild severity  fixed defect in the apical wall consistent with breast attenuation versus small prior infarct.  Marked improvement from anterior apical perfusion abnormality seen in February 2020.   TRANSTHORACIC ECHOCARDIOGRAM  12/08/2020   Normal LV Fxn - EF 60-65%. No RWMA. Mod Asymmetric Basal Septa LVH with mild Concentric LVH. Gr II DD. Normal Longitudinal Strain. Normal RV size & fxn - normal RAP/CVP.  Normal MV. Mild AoV sclerosis w/ stenosis. Mild Ascending Ao dilation - ~38 mm (borderline).  => No change from 04/2017.   TUBAL LIGATION Bilateral    VAGINAL HYSTERECTOMY  2003   with LSO and right salpingectomy; right ovary still present   Relook CATH-PCI 07/02/2019: proxLAD proximal edge dissection - covered with 2nd Resolute Onyx DES 3.5 x 8 (post-dilated to 3.8 mm)   Immunization History  Administered Date(s) Administered   Influenza Inj Mdck Quad Pf 04/03/2018   Influenza,inj,Quad PF,6+ Mos 04/08/2017   Janssen (J&J) SARS-COV-2 Vaccination 09/29/2019   Td 07/23/2008   Tdap 05/24/2019   Zoster Recombinat (Shingrix) 04/03/2018    MEDICATIONS/ALLERGIES   Current Meds  Medication Sig   Cholecalciferol (VITAMIN D3) 125 MCG (5000 UT) TABS Take 5,000 Units by mouth every morning.    clopidogrel (PLAVIX) 75 MG tablet TAKE 1 TABLET BY MOUTH EVERY DAY   losartan-hydrochlorothiazide (HYZAAR) 100-12.5 MG tablet Take 0.5 tablets by mouth daily.   Multiple Vitamins-Minerals (ONE-A-DAY WOMENS 50+ ADVANTAGE PO) Take by mouth.   nitroGLYCERIN (NITROSTAT) 0.4 MG SL tablet Place 1 tablet (0.4 mg total) under the tongue every 5 (five) minutes as needed.   pantoprazole (PROTONIX) 40 MG tablet Take 40 mg by mouth daily.   rosuvastatin (CRESTOR) 40 MG tablet Take by mouth.   sertraline (ZOLOFT) 50 MG tablet Take 50 mg by mouth daily.  Nebivolol (BYSTOLIC) 2.5 MG  tablet Take 1 tablet by mouth daily    Allergies  Allergen Reactions   Ceclor [Cefaclor] Hives and Rash   Tetracyclines & Related Hives    Lipitor [Atorvastatin Calcium] Cough   Penicillins Swelling and Other (See Comments)    Has patient had a PCN reaction causing immediate rash, facial/tongue/throat swelling, SOB or lightheadedness with hypotension: No Has patient had a PCN reaction causing severe rash involving mucus membranes or skin necrosis: No Has patient had a PCN reaction that required hospitalization: Yes Has patient had a PCN reaction occurring within the last 10 years: No If all of the above answers are "NO", then may proceed with Cephalosporin use.   Amoxicillin Rash   Ampicillin Rash   Lisinopril Cough   Simvastatin Cough    SOCIAL HISTORY/FAMILY HISTORY   Reviewed in Epic:  Pertinent findings:  Social History   Tobacco Use   Smoking status: Former    Packs/day: 0.10    Years: 30.00    Pack years: 3.00    Types: Cigarettes    Quit date: 04/26/2012    Years since quitting: 9.0   Smokeless tobacco: Never  Vaping Use   Vaping Use: Never used  Substance Use Topics   Alcohol use: Yes    Comment: occasional   Drug use: No   Social History   Social History Narrative   ** Merged History Encounter **       Lives with husband [name]). She travels very frequently with her job and is gone most weeks out of the month. She is active and exercises when able.    OBJCTIVE -PE, EKG, labs   Wt Readings from Last 3 Encounters:  04/12/21 214 lb 12.8 oz (97.4 kg)  02/08/21 212 lb 6.4 oz (96.3 kg)  10/31/20 206 lb 6.4 oz (93.6 kg)    Physical Exam: BP 125/82   Pulse 62   Ht 5\' 7"  (1.702 m)   Wt 214 lb 12.8 oz (97.4 kg)   LMP 07/24/2003 (Within Months)   SpO2 90%   BMI 33.64 kg/m  Physical Exam Vitals reviewed.  Constitutional:      Appearance: Normal appearance. She is obese. She is not ill-appearing.     Comments: Well-groomed.  HENT:     Head: Normocephalic and atraumatic.  Neck:     Vascular: No carotid bruit.  Cardiovascular:     Rate and Rhythm: Normal rate and regular rhythm.  Occasional Extrasystoles are present.    Chest Wall: PMI is not displaced.     Pulses: Normal pulses.     Heart sounds: S1 normal and S2 normal. Heart sounds are distant. No murmur heard.   No friction rub. No gallop.  Pulmonary:     Effort: Pulmonary effort is normal. No respiratory distress.     Breath sounds: Normal breath sounds.  Chest:     Chest wall: No tenderness.  Musculoskeletal:        General: No swelling. Normal range of motion.     Cervical back: Normal range of motion and neck supple. No rigidity.  Skin:    General: Skin is warm and dry.  Neurological:     General: No focal deficit present.     Mental Status: She is alert and oriented to person, place, and time.     Gait: Gait normal.  Psychiatric:        Mood and Affect: Mood normal.        Behavior: Behavior normal.  Thought Content: Thought content normal.        Judgment: Judgment normal.     Comments: A little anxious, but doing better.    Adult ECG Report N/a  Recent Labs:  due for labs!  Lab Results  Component Value Date   CHOL 138 06/24/2020   HDL 60 06/24/2020   LDLCALC 53 06/24/2020   TRIG 151 (H) 06/24/2020   CHOLHDL 2.3 06/24/2020   Lab Results  Component Value Date   CREATININE 0.83 06/24/2020   BUN 18 06/24/2020   NA 141 06/24/2020   K 4.4 06/24/2020   CL 104 06/24/2020   CO2 25 06/24/2020   CBC Latest Ref Rng & Units 10/23/2019 07/01/2019 05/06/2019  WBC 3.4 - 10.8 x10E3/uL 5.1 6.4 7.6  Hemoglobin 11.1 - 15.9 g/dL 13.7 13.5 13.6  Hematocrit 34.0 - 46.6 % 40.3 39.6 40.1  Platelets 150 - 450 x10E3/uL 183 187 206    Lab Results  Component Value Date   TSH 2.480 10/31/2020    ==================================================  COVID-19 Education: The signs and symptoms of COVID-19 were discussed with the patient and how to seek care for testing (follow up with PCP or arrange E-visit).    I spent a total of 27 minutes with the patient spent in direct patient consultation.   Additional time spent with chart review  / charting (studies, outside notes, etc): 18 min Total Time: 45 min  Current medicines are reviewed at length with the patient today.  (+/- concerns) ??  None  This visit occurred during the SARS-CoV-2 public health emergency.  Safety protocols were in place, including screening questions prior to the visit, additional usage of staff PPE, and extensive cleaning of exam room while observing appropriate contact time as indicated for disinfecting solutions.  Notice: This dictation was prepared with Dragon dictation along with smaller phrase technology. Any transcriptional errors that result from this process are unintentional and may not be corrected upon review.  Patient Instructions / Medication Changes & Studies & Tests Ordered   Patient Instructions  Medication Instructions:   Propranolol  10 mg - may take 5 mg ( 1/2 tablet)  to 10 mg whole tablet)   as needed for anxiety  up to twice a day  *If you need a refill on your cardiac medications before your next appointment, please call your pharmacy*   Lab Work: Cmp Lipid- fasting cbc If you have labs (blood work) drawn today and your tests are completely normal, you will receive your results only by: [place] (if you have MyChart) OR A paper copy in the mail If you have any lab test that is abnormal or we need to change your treatment, we will call you to review the results.   Testing/Procedures:  Not needed   Follow-Up: At [facility], you and your health needs are our priority.  As part of our continuing mission to provide you with exceptional heart care, we have created designated Provider Care Teams.  These Care Teams include your primary Cardiologist (physician) and Advanced Practice Providers (APPs -  Physician Assistants and Nurse Practitioners) who all work together to provide you with the care you need, when you need it.     Your next appointment:   4 to 5 month(s) Mary Sella  Kary Kos  The format for your next appointment:   In Person  Provider:   You may see [name], MD or one of the following Advanced Practice Providers on your designated Care Team:   Suanne Marker  Barrett, PA-C Caron Presume, PA-C [name], DNP, ANP   Other Instructions    Studies Ordered:   Orders Placed This Encounter  Procedures   CBC   Lipid panel   Comprehensive metabolic panel      [name], M.D., M.S. Interventional Cardiologist   Pager # [phone] Phone # [phone] [address] [place], [place] [address]   Thank you for choosing Heartcare at [facility]!!

## 2021-04-12 NOTE — Patient Instructions (Addendum)
Medication Instructions:   Propranolol  10 mg - may take 5 mg ( 1/2 tablet)  to 10 mg whole tablet)   as needed for anxiety  up to twice a day  *If you need a refill on your cardiac medications before your next appointment, please call your pharmacy*   Lab Work: Cmp Lipid- fasting cbc If you have labs (blood work) drawn today and your tests are completely normal, you will receive your results only by: [place] (if you have MyChart) OR A paper copy in the mail If you have any lab test that is abnormal or we need to change your treatment, we will call you to review the results.   Testing/Procedures:  Not needed   Follow-Up: At [facility], you and your health needs are our priority.  As part of our continuing mission to provide you with exceptional heart care, we have created designated Provider Care Teams.  These Care Teams include your primary Cardiologist (physician) and Advanced Practice Providers (APPs -  Physician Assistants and Nurse Practitioners) who all work together to provide you with the care you need, when you need it.     Your next appointment:   4 to 5 month(s) Mary Sella Kary Kos  The format for your next appointment:   In Person  Provider:   You may see [name], MD or one of the following Advanced Practice Providers on your designated Care Team:   Rosaria Ferries, PA-C Caron Presume, PA-C [name], DNP, ANP   Other Instructions

## 2021-04-30 ENCOUNTER — Encounter: Payer: Self-pay | Admitting: Cardiology

## 2021-04-30 NOTE — Assessment & Plan Note (Signed)
Baseline heart rate 62 bpm off of beta-blocker.  Unfortunately she does have palpitations with PACs mostly.  Plan: Off of Bystolic now with improved energy and better heart rate control.  We will use PRN propranolol which is short acting, for palpitations.

## 2021-04-30 NOTE — Assessment & Plan Note (Signed)
SinceNot really having any further episodes of angina her most recent PCI in December 2020.  She had a Myoview done April 2021 that was nonischemic.  She has some off-and-on atypical sounding chest pain symptoms, but what she is feeling now is more of a palpitation issue.  We stopped her beta-blocker and calcium channel blocker because of fatigue issues.  Plan:   Continue maintenance  monotherapy with clopidogrel along with statin. ->    Okay to hold Plavix 5-7 days preop for surgery or procedures.  On ARB and HCTZ.  Low threshold to consider calcium channel blocker if additional blood pressure control is needed.

## 2021-04-30 NOTE — Assessment & Plan Note (Signed)
Still having off-and-on palpitations.  No longer on Bystolic which was keeping his a day.  Now we discussed options of either going back on a standing dose versus using PRN p short acting beta-blocker (propranolol 10 mg).  I do think that these are premature anxiety driven and a beta-blocker would be reasonable to use as a PRN.  She does not want to use Zoloft.  Propranolol  10 mg - may take 5 mg ( 1/2 tablet)  to 10 mg whole tablet)   as needed for anxiety  up to twice a day

## 2021-04-30 NOTE — Assessment & Plan Note (Signed)
Blood pressure looks pretty well controlled off of Bystolic. Only on losartan-HCTZ.  Thankfully, she does have enough blood pressure room to use a PRN propranolol for palpitations.

## 2021-04-30 NOTE — Assessment & Plan Note (Signed)
Labs were last checked in December.  Well-controlled with exception of borderline elevated triglycerides. She is back on statin having not really noted a difference with short statin holiday.  Better off with holding beta-blocker.  Plan: Continue current dose of rosuvastatin and check follow-up labs including lipid panel, CMP and CBC.

## 2021-04-30 NOTE — Assessment & Plan Note (Signed)
Did not tolerate Ozempic. Hopefully now that her energy level improving, she can work on increasing exercise and lose weight with adjusting diet and increasing exercise.

## 2021-04-30 NOTE — Assessment & Plan Note (Signed)
Now 2 overlapping DES stents in the LAD.  On maintenance monotherapy Plavix 75 mg daily.   Okay to hold Plavix 5-7 days preop for surgery procedures.  (7 days for high risk procedures).[name]

## 2021-04-30 NOTE — Assessment & Plan Note (Signed)
This was probably related to some chronotropic incompetence on beta-blocker.  Beta-blocker now discontinued and energy level improving.

## 2021-05-24 ENCOUNTER — Other Ambulatory Visit: Payer: Self-pay | Admitting: Cardiology

## 2021-07-15 ENCOUNTER — Emergency Department (HOSPITAL_COMMUNITY): Payer: Medicare Other

## 2021-07-15 ENCOUNTER — Inpatient Hospital Stay (HOSPITAL_COMMUNITY): Payer: Medicare Other | Admitting: Anesthesiology

## 2021-07-15 ENCOUNTER — Encounter (HOSPITAL_COMMUNITY): Payer: Self-pay | Admitting: Internal Medicine

## 2021-07-15 ENCOUNTER — Encounter (HOSPITAL_COMMUNITY)
Admission: EM | Disposition: A | Discharge status: Home / Self Care | Payer: Self-pay | Source: Home / Self Care | Attending: Internal Medicine

## 2021-07-15 ENCOUNTER — Other Ambulatory Visit: Payer: Self-pay

## 2021-07-15 ENCOUNTER — Inpatient Hospital Stay (HOSPITAL_COMMUNITY)
Admission: EM | Admit: 2021-07-15 | Discharge: 2021-07-16 | DRG: 378 | Disposition: A | Discharge status: Home / Self Care | Payer: Medicare Other | Attending: Internal Medicine | Admitting: Internal Medicine

## 2021-07-15 DIAGNOSIS — E669 Obesity, unspecified: Secondary | ICD-10-CM | POA: Diagnosis present

## 2021-07-15 DIAGNOSIS — Z7902 Long term (current) use of antithrombotics/antiplatelets: Secondary | ICD-10-CM | POA: Diagnosis not present

## 2021-07-15 DIAGNOSIS — R002 Palpitations: Secondary | ICD-10-CM | POA: Diagnosis not present

## 2021-07-15 DIAGNOSIS — R001 Bradycardia, unspecified: Secondary | ICD-10-CM | POA: Diagnosis not present

## 2021-07-15 DIAGNOSIS — Z8249 Family history of ischemic heart disease and other diseases of the circulatory system: Secondary | ICD-10-CM

## 2021-07-15 DIAGNOSIS — Z880 Allergy status to penicillin: Secondary | ICD-10-CM

## 2021-07-15 DIAGNOSIS — K284 Chronic or unspecified gastrojejunal ulcer with hemorrhage: Secondary | ICD-10-CM | POA: Diagnosis not present

## 2021-07-15 DIAGNOSIS — Z955 Presence of coronary angioplasty implant and graft: Secondary | ICD-10-CM | POA: Diagnosis not present

## 2021-07-15 DIAGNOSIS — I10 Essential (primary) hypertension: Secondary | ICD-10-CM | POA: Diagnosis present

## 2021-07-15 DIAGNOSIS — F32A Depression, unspecified: Secondary | ICD-10-CM | POA: Diagnosis not present

## 2021-07-15 DIAGNOSIS — Z79899 Other long term (current) drug therapy: Secondary | ICD-10-CM | POA: Diagnosis not present

## 2021-07-15 DIAGNOSIS — Z980 Intestinal bypass and anastomosis status: Secondary | ICD-10-CM | POA: Diagnosis not present

## 2021-07-15 DIAGNOSIS — Z800 Family history of malignant neoplasm of digestive organs: Secondary | ICD-10-CM

## 2021-07-15 DIAGNOSIS — D62 Acute posthemorrhagic anemia: Secondary | ICD-10-CM | POA: Diagnosis present

## 2021-07-15 DIAGNOSIS — G4733 Obstructive sleep apnea (adult) (pediatric): Secondary | ICD-10-CM | POA: Diagnosis not present

## 2021-07-15 DIAGNOSIS — E785 Hyperlipidemia, unspecified: Secondary | ICD-10-CM | POA: Diagnosis present

## 2021-07-15 DIAGNOSIS — Z7901 Long term (current) use of anticoagulants: Secondary | ICD-10-CM

## 2021-07-15 DIAGNOSIS — Z6831 Body mass index (BMI) 31.0-31.9, adult: Secondary | ICD-10-CM

## 2021-07-15 DIAGNOSIS — F329 Major depressive disorder, single episode, unspecified: Secondary | ICD-10-CM | POA: Diagnosis present

## 2021-07-15 DIAGNOSIS — K289 Gastrojejunal ulcer, unspecified as acute or chronic, without hemorrhage or perforation: Secondary | ICD-10-CM

## 2021-07-15 DIAGNOSIS — K921 Melena: Secondary | ICD-10-CM

## 2021-07-15 DIAGNOSIS — K922 Gastrointestinal hemorrhage, unspecified: Secondary | ICD-10-CM | POA: Diagnosis not present

## 2021-07-15 DIAGNOSIS — R4020 Unspecified coma: Secondary | ICD-10-CM | POA: Diagnosis present

## 2021-07-15 DIAGNOSIS — F419 Anxiety disorder, unspecified: Secondary | ICD-10-CM | POA: Diagnosis not present

## 2021-07-15 DIAGNOSIS — Z9884 Bariatric surgery status: Secondary | ICD-10-CM

## 2021-07-15 DIAGNOSIS — I2510 Atherosclerotic heart disease of native coronary artery without angina pectoris: Secondary | ICD-10-CM | POA: Diagnosis not present

## 2021-07-15 DIAGNOSIS — Z83438 Family history of other disorder of lipoprotein metabolism and other lipidemia: Secondary | ICD-10-CM | POA: Diagnosis not present

## 2021-07-15 DIAGNOSIS — R195 Other fecal abnormalities: Secondary | ICD-10-CM | POA: Diagnosis not present

## 2021-07-15 DIAGNOSIS — Z888 Allergy status to other drugs, medicaments and biological substances status: Secondary | ICD-10-CM

## 2021-07-15 DIAGNOSIS — R55 Syncope and collapse: Secondary | ICD-10-CM | POA: Diagnosis not present

## 2021-07-15 DIAGNOSIS — Z881 Allergy status to other antibiotic agents status: Secondary | ICD-10-CM | POA: Diagnosis not present

## 2021-07-15 DIAGNOSIS — K219 Gastro-esophageal reflux disease without esophagitis: Secondary | ICD-10-CM | POA: Diagnosis present

## 2021-07-15 DIAGNOSIS — Z20822 Contact with and (suspected) exposure to covid-19: Secondary | ICD-10-CM | POA: Diagnosis present

## 2021-07-15 DIAGNOSIS — Z87891 Personal history of nicotine dependence: Secondary | ICD-10-CM

## 2021-07-15 HISTORY — PX: ENTEROSCOPY: SHX5533

## 2021-07-15 HISTORY — PX: BIOPSY: SHX5522

## 2021-07-15 LAB — PROTIME-INR
INR: 1.1 (ref 0.8–1.2)
Prothrombin Time: 13.8 seconds (ref 11.4–15.2)

## 2021-07-15 LAB — CBC WITH DIFFERENTIAL/PLATELET
Abs Immature Granulocytes: 0.02 10*3/uL (ref 0.00–0.07)
Basophils Absolute: 0 10*3/uL (ref 0.0–0.1)
Basophils Relative: 0 %
Eosinophils Absolute: 0 10*3/uL (ref 0.0–0.5)
Eosinophils Relative: 1 %
HCT: 31.4 % — ABNORMAL LOW (ref 36.0–46.0)
Hemoglobin: 10.5 g/dL — ABNORMAL LOW (ref 12.0–15.0)
Immature Granulocytes: 0 %
Lymphocytes Relative: 23 %
Lymphs Abs: 1.7 10*3/uL (ref 0.7–4.0)
MCH: 29.7 pg (ref 26.0–34.0)
MCHC: 33.4 g/dL (ref 30.0–36.0)
MCV: 89 fL (ref 80.0–100.0)
Monocytes Absolute: 0.4 10*3/uL (ref 0.1–1.0)
Monocytes Relative: 6 %
Neutro Abs: 5.2 10*3/uL (ref 1.7–7.7)
Neutrophils Relative %: 70 %
Platelets: 149 10*3/uL — ABNORMAL LOW (ref 150–400)
RBC: 3.53 MIL/uL — ABNORMAL LOW (ref 3.87–5.11)
RDW: 13.6 % (ref 11.5–15.5)
WBC: 7.4 10*3/uL (ref 4.0–10.5)
nRBC: 0 % (ref 0.0–0.2)

## 2021-07-15 LAB — COMPREHENSIVE METABOLIC PANEL
ALT: 18 U/L (ref 0–44)
AST: 16 U/L (ref 15–41)
Albumin: 3.5 g/dL (ref 3.5–5.0)
Alkaline Phosphatase: 46 U/L (ref 38–126)
Anion gap: 10 (ref 5–15)
BUN: 39 mg/dL — ABNORMAL HIGH (ref 8–23)
CO2: 21 mmol/L — ABNORMAL LOW (ref 22–32)
Calcium: 9.2 mg/dL (ref 8.9–10.3)
Chloride: 107 mmol/L (ref 98–111)
Creatinine, Ser: 0.89 mg/dL (ref 0.44–1.00)
GFR, Estimated: >60 mL/min (ref >60)
Glucose, Bld: 126 mg/dL — ABNORMAL HIGH (ref 70–99)
Potassium: 3.5 mmol/L (ref 3.5–5.1)
Sodium: 138 mmol/L (ref 135–145)
Total Bilirubin: 0.9 mg/dL (ref 0.3–1.2)
Total Protein: 5.9 g/dL — ABNORMAL LOW (ref 6.5–8.1)

## 2021-07-15 LAB — CBC
HCT: 28.9 % — ABNORMAL LOW (ref 36.0–46.0)
Hemoglobin: 10 g/dL — ABNORMAL LOW (ref 12.0–15.0)
MCH: 30.8 pg (ref 26.0–34.0)
MCHC: 34.6 g/dL (ref 30.0–36.0)
MCV: 88.9 fL (ref 80.0–100.0)
Platelets: 129 10*3/uL — ABNORMAL LOW (ref 150–400)
RBC: 3.25 MIL/uL — ABNORMAL LOW (ref 3.87–5.11)
RDW: 13.6 % (ref 11.5–15.5)
WBC: 5.7 10*3/uL (ref 4.0–10.5)
nRBC: 0 % (ref 0.0–0.2)

## 2021-07-15 LAB — URINALYSIS, ROUTINE W REFLEX MICROSCOPIC
Bilirubin Urine: NEGATIVE
Glucose, UA: NEGATIVE mg/dL
Ketones, ur: NEGATIVE mg/dL
Leukocytes,Ua: NEGATIVE
Nitrite: NEGATIVE
Protein, ur: NEGATIVE mg/dL
Specific Gravity, Urine: 1.02 (ref 1.005–1.030)
pH: 5.5 (ref 5.0–8.0)

## 2021-07-15 LAB — URINALYSIS, MICROSCOPIC (REFLEX)

## 2021-07-15 LAB — CBG MONITORING, ED: Glucose-Capillary: 125 mg/dL — ABNORMAL HIGH (ref 70–99)

## 2021-07-15 LAB — IRON AND TIBC
Iron: 57 ug/dL (ref 28–170)
Saturation Ratios: 18 % (ref 10.4–31.8)
TIBC: 322 ug/dL (ref 250–450)
UIBC: 265 ug/dL

## 2021-07-15 LAB — TSH: TSH: 1.216 u[IU]/mL (ref 0.350–4.500)

## 2021-07-15 LAB — TYPE AND SCREEN
ABO/RH(D): A POS
Antibody Screen: NEGATIVE

## 2021-07-15 LAB — RESP PANEL BY RT-PCR (FLU A&B, COVID) ARPGX2
Influenza A by PCR: NEGATIVE
Influenza B by PCR: NEGATIVE
SARS Coronavirus 2 by RT PCR: NEGATIVE

## 2021-07-15 LAB — POC OCCULT BLOOD, ED: Fecal Occult Bld: POSITIVE — AB

## 2021-07-15 LAB — FERRITIN: Ferritin: 17 ng/mL (ref 11–307)

## 2021-07-15 LAB — TROPONIN I (HIGH SENSITIVITY)
Troponin I (High Sensitivity): 4 ng/L (ref <18)
Troponin I (High Sensitivity): 4 ng/L (ref <18)

## 2021-07-15 LAB — HIV ANTIBODY (ROUTINE TESTING W REFLEX): HIV Screen 4th Generation wRfx: NONREACTIVE

## 2021-07-15 SURGERY — ENTEROSCOPY
Anesthesia: Monitor Anesthesia Care

## 2021-07-15 MED ORDER — PROPOFOL 10 MG/ML IV BOLUS
INTRAVENOUS | Status: DC | PRN
  Administered 2021-07-15: 30 mg via INTRAVENOUS

## 2021-07-15 MED ORDER — LACTATED RINGERS IV SOLN: INTRAVENOUS | Status: DC | PRN

## 2021-07-15 MED ORDER — SODIUM CHLORIDE 0.9 % IV BOLUS
1000.0000 mL | Freq: Once | INTRAVENOUS | Status: AC
  Administered 2021-07-15: 09:00:00 1000 mL via INTRAVENOUS

## 2021-07-15 MED ORDER — SODIUM CHLORIDE 0.9 % IV SOLN: INTRAVENOUS | Status: DC

## 2021-07-15 MED ORDER — LOSARTAN POTASSIUM-HCTZ 100-12.5 MG PO TABS: 0.5000 | ORAL_TABLET | Freq: Every day | ORAL | Status: DC

## 2021-07-15 MED ORDER — SODIUM CHLORIDE 0.9% FLUSH: 3.0000 mL | Freq: Two times a day (BID) | INTRAVENOUS | Status: DC

## 2021-07-15 MED ORDER — LIDOCAINE 2% (20 MG/ML) 5 ML SYRINGE
INTRAMUSCULAR | Status: DC | PRN
Start: 2021-07-15 — End: 2021-07-15
  Administered 2021-07-15: 40 mg via INTRAVENOUS

## 2021-07-15 MED ORDER — PANTOPRAZOLE INFUSION (NEW) - SIMPLE MED
8.0000 mg/h | INTRAVENOUS | Status: DC
  Administered 2021-07-15 – 2021-07-16 (×3): 8 mg/h via INTRAVENOUS
  Filled 2021-07-15: qty 80
  Filled 2021-07-15: qty 100
  Filled 2021-07-15 (×2): qty 80

## 2021-07-15 MED ORDER — SUCRALFATE 1 GM/10ML PO SUSP
1.0000 g | Freq: Three times a day (TID) | ORAL | Status: DC
Start: 2021-07-15 — End: 2021-07-16
  Administered 2021-07-15 – 2021-07-16 (×4): 1 g via ORAL
  Filled 2021-07-15 (×3): qty 10

## 2021-07-15 MED ORDER — SERTRALINE HCL 50 MG PO TABS
50.0000 mg | ORAL_TABLET | Freq: Every day | ORAL | Status: DC
  Administered 2021-07-16: 08:00:00 50 mg via ORAL
  Filled 2021-07-15: qty 1

## 2021-07-15 MED ORDER — PANTOPRAZOLE 80MG IVPB - SIMPLE MED
80.0000 mg | Freq: Once | INTRAVENOUS | Status: AC
  Administered 2021-07-15: 80 mg via INTRAVENOUS
  Filled 2021-07-15: qty 80

## 2021-07-15 MED ORDER — ROSUVASTATIN CALCIUM 20 MG PO TABS
40.0000 mg | ORAL_TABLET | Freq: Every day | ORAL | Status: DC
  Administered 2021-07-16: 08:00:00 40 mg via ORAL
  Filled 2021-07-15: qty 2

## 2021-07-15 MED ORDER — PROPOFOL 500 MG/50ML IV EMUL
INTRAVENOUS | Status: DC | PRN
  Administered 2021-07-15: 150 ug/kg/min via INTRAVENOUS

## 2021-07-15 SURGICAL SUPPLY — 15 items

## 2021-07-15 NOTE — ED Notes (Signed)
PT to endoscopy.  Will return to unit once endoscopy is complete.

## 2021-07-15 NOTE — Anesthesia Postprocedure Evaluation (Signed)
Anesthesia Post Note  Patient: Carrie Reynolds  Procedure(s) Performed: ENTEROSCOPY BIOPSY     Patient location during evaluation: PACU Anesthesia Type: MAC Level of consciousness: awake and alert Pain management: pain level controlled Vital Signs Assessment: post-procedure vital signs reviewed and stable Respiratory status: spontaneous breathing, nonlabored ventilation, respiratory function stable and patient connected to nasal cannula oxygen Cardiovascular status: stable and blood pressure returned to baseline Postop Assessment: no apparent nausea or vomiting Anesthetic complications: no   No notable events documented.  Last Vitals:  Vitals:   07/15/21 1529 07/15/21 1549  BP: 122/70 122/64  Pulse: 72 (!) 58  Resp: 18 17  Temp:  36.7 C  SpO2: 100% 100%    Last Pain:  Vitals:   07/15/21 1549  TempSrc: Oral  PainSc: 0-No pain                 Oronde Hallenbeck S

## 2021-07-15 NOTE — Transfer of Care (Signed)
Immediate Anesthesia Transfer of Care Note  Patient: Carrie Reynolds  Procedure(s) Performed: ENTEROSCOPY BIOPSY  Patient Location: PACU  Anesthesia Type:MAC  Level of Consciousness: awake, alert  and oriented  Airway & Oxygen Therapy: Patient Spontanous Breathing  Post-op Assessment: Report given to RN and Post -op Vital signs reviewed and stable  Post vital signs: Reviewed and stable  Last Vitals:  Vitals Value Taken Time  BP 114/62 07/15/21 1512  Temp 36.7 C 07/15/21 1512  Pulse 69 07/15/21 1518  Resp 29 07/15/21 1518  SpO2 100 % 07/15/21 1518  Vitals shown include unvalidated device data.  Last Pain:  Vitals:   07/15/21 1512  TempSrc: Oral  PainSc:          Complications: No notable events documented.

## 2021-07-15 NOTE — Op Note (Addendum)
[facility] Patient Name: Carrie Reynolds Procedure Date : 07/15/2021 MRN: [id number] Attending MD: Justice Britain , MD Date of Birth: Aug 18, 1955 CSN: [id number] Age: 65 Admit Type: Inpatient Procedure:                Small bowel enteroscopy Indications:              Unexplained epigastric abdominal distress/pain,                            Acute post hemorrhagic anemia, Melena,                            Gastrointestinal occult blood loss Providers:                Justice Britain, MD, Doristine Johns, RN, Cherylynn Ridges, Technician, Trixie Deis, CRNA Referring MD:             [facility] Medicines:                Monitored Anesthesia Care Complications:            No immediate complications. Estimated Blood Loss:     Estimated blood loss was minimal. Procedure:                Pre-Anesthesia Assessment:                           - Prior to the procedure, a History and Physical                            was performed, and patient medications and                            allergies were reviewed. The patient's tolerance of                            previous anesthesia was also reviewed. The risks                            and benefits of the procedure and the sedation                            options and risks were discussed with the patient.                            All questions were answered, and informed consent                            was obtained. Prior Anticoagulants: The patient has                            taken Plavix (clopidogrel), last dose was 1 day  prior to procedure. ASA Grade Assessment: III - A                            patient with severe systemic disease. After                            reviewing the risks and benefits, the patient was                            deemed in satisfactory condition to undergo the                            procedure.                            After obtaining informed consent, the endoscope was                            passed under direct vision. Throughout the                            procedure, the patient's blood pressure, pulse, and                            oxygen saturations were monitored continuously. The                            [id number] ([id number]) Olympus endoscope was                            introduced through the mouth, and advanced to the                            afferent and efferent jejunal loop. After obtaining                            informed consent, the endoscope was passed under                            direct vision. Throughout the procedure, the                            patient's blood pressure, pulse, and oxygen                            saturations were monitored continuously.The small                            bowel enteroscopy was accomplished without                            difficulty. The patient tolerated the procedure. Scope In: Scope Out: Findings:      No gross lesions were noted in the proximal esophagus and in the mid  esophagus.      One tongue of salmon-colored mucosa was present from 34 to 35 cm in the       distal esophagus. No other visible abnormalities were present. Biopsies       were taken with a cold forceps for histology to rule in/out Barrett's.      The Z-line was irregular and was found 35 cm from the incisors.      Evidence of a Roux-en-Y gastrojejunostomy was found. The gastrojejunal       anastomosis was characterized by congestion, erosion, inflammation. The       [address] anastomosis was traversed. The pouch-to-jejunum limb was       characterized by healthy appearing mucosa and an intact staple which was       removed with a forceps. Biopsies were taken with a cold forceps for       histology and Helicobacter pylori testing of the gastric pouch.      Two non-bleeding cratered jejunal ulcers just distal to the anastomosis       with clean ulcer bases  (Forrest Class III) were found in the jejunum.       The largest lesion was 24 mm in largest dimension and the second lesion       was approximately 9 mm in size.      Normal mucosa was found in the afferent/efferent jejunum that was       examined. Impression:               - No gross lesions in esophagus proximally.                            Salmon-colored mucosa suspicious for Barrett's                            esophagus distally - biopsied.                           - Z-line irregular, 35 cm from the incisors.                           - Roux-en-Y gastrojejunostomy with gastrojejunal                            anastomosis characterized by slight congestion,                            erosion, inflammation. Gastric pouch with a staple                            that was removed.                           - 2 non-bleeding jejunal ulcers with a clean ulcer                            base (Forrest Class III) were found just distal to                            the anastomosis within the jejunum (largest was  24                            mm in size).                           - Normal mucosa was found in the visualized jejunal                            limbs. Recommendation:           - The patient will be observed post-procedure,                            until all discharge criteria are met.                           - Return patient to hospital ward for ongoing care.                           - Await pathology results.                           - IV PPI BID x 24 hours. If doing well and hemogram                            has stabilized tomorrow then may transition to PO                            PPI BID.                           - Carafate QAC + QHS to be initiated for next                            46-month                           - Depending on nadir of patient's hemogram consider                            IV Iron vs PO Iron upon discharge.                           -  Full liquid diet today and if doing well then may                            advance tomorrow.                           - If possible to wait on restart of Plavix for at                            least 72 hours that would be ideal to allow PPI and  healing.                           - Minimize any other NSAIDs as able.                           - Expect that she will have melena for the next 24                            hours with drop in hemogram (she has melena on exam                            today) from her having recently bled. Only plan to                            repeat evaluation if transfusion dependent anemia                            develops.                           - Repeat the enteroscopy in 4 months to check                            healing would be a recommendation.                           - She is due for colonoscopy (last in 2011). She                            has a primary GI with Dr. Earlean Shawl, but as he is                            retiring in the coming months, if she desires to                            have her procedures with [facility] we would be happy                            to be available.                           - The findings and recommendations were discussed                            with the patient.                           - The findings and recommendations were discussed                            with the referring physician. Procedure Code(s):        --- Professional ---  [phone], Small intestinal endoscopy, enteroscopy                            beyond second portion of duodenum, not including                            ileum; with removal of foreign body(s)                           44361, Small intestinal endoscopy, enteroscopy                            beyond second portion of duodenum, not including                            ileum; with biopsy, single or multiple Diagnosis  Code(s):        --- Professional ---                           K22.8, Other specified diseases of esophagus                           Z98.0, Intestinal bypass and anastomosis status                           K28.9, Gastrojejunal ulcer, unspecified as acute or                            chronic, without hemorrhage or perforation                           R10.13, Epigastric pain                           D62, Acute posthemorrhagic anemia                           K92.1, Melena (includes Hematochezia)                           R19.5, Other fecal abnormalities CPT copyright 2019 [employer]. All rights reserved. The codes documented in this report are preliminary and upon coder review may  be revised to meet current compliance requirements. Justice Britain, MD 07/15/2021 3:19:16 PM Number of Addenda: 0

## 2021-07-15 NOTE — Consult Note (Signed)
Referring Provider: Dr. Isla Pence Primary Care Physician:  Leonard Downing, MD Primary Gastroenterologist:  Dr.Patterson [facility] 2011  Reason for Consultation: Upper GI bleed/melena, anemia  HPI: Carrie Reynolds is a 65 y.o. female with a past medical history of depression, hypertension, CAD s/p DES x 2 2020 on Plavix, obstructive sleep apnea, GERD S/P gastric bypass surgery at [facility] in 2017. She developed dizziness with shortness of breath while walking up the stairs and sequently had a syncopal episode yesterday evening. She was found down by her husband who assisted her to bed.  She awakened at 6 AM today to urinate and she passed out on the way to the bathroom.  Her husband called 31 and she presented to [facility] ED for further evaluation.  Labs in the ED showed hemoglobin 10.5 (baseline hemoglobin 13.7).  Elevated BUN 39. Her last dose of Plavix was taken at 11 AM on 07/14/2021. She was started Protonix bolus followed by infusion.  She passed 4 loose melenic stools since arriving to the ED.  The patient denied any prior knowledge that her stools were black, she does not look at her stool prior to flushing the toilet.  A GI consult was requested for further evaluation.  She endorses having lower sternal/epigastric pain most days for the past year.  She reported seeing her cardiologist Dr. Ellyn Hack regarding this chest pain and apparently there was no concern that this was cardiac pain.  Eating food reduces this pain.  No dysphagia or classic heartburn symptoms.  She takes Pantoprazole 40 mg p.o. daily for the past 2 years.  No NSAIDs.  She denies ever having an EGD.  She underwent a colonoscopy by Dr. Sharlett Iles in 2011, 4 hyperplastic polyps and one tubulovillous adenomatous polyp was removed from the sigmoid colon.  She was advised to repeat a colonoscopy in 1 year which was not done.  No grandmother with history of colon cancer.  Past Medical History:  Diagnosis Date    Anxiety    Arnold-Chiari malformation ([place]) 2006   CAD S/P PCI 05/06/2019   05/06/2019: prox LAD (@ SPI) PCI with Resolute Onyx DES 3.5  x 8, Residual 60% PDA, Normal LVF; 07/02/2019: relook Cath for Anterior Ischemia on [place] --> CATH 70% pre-stent/proximal edge dissection --> proximal overlapping DES (Resolute Onyx 3.5 x 8 - new & old stent post-dilated to 3.8 mm)   Cervical strain    syndrome   Cervicogenic headache    Concussion with loss of consciousness 10/09/2017   CTS (carpal tunnel syndrome)    Depression    GERD (gastroesophageal reflux disease)    Glucose intolerance (impaired glucose tolerance)    High coronary artery calcium score    Score of 1460.  Coronary CTA shows high-grade proximal-mid LAD stenosis (CT FFR 0.50).  Also PDA/PLA 60 to 70% stenosis (CTO FFR 0.75)   History of kidney stones    Hypertension    Migraine headache    with visual aura   Obesity    OSA (obstructive sleep apnea)    not treated   Postmenopausal    Vertigo    post concussive    Past Surgical History:  Procedure Laterality Date   BUNIONECTOMY     right great toe   CHOLECYSTECTOMY N/A 06/05/2018   Procedure: LAPAROSCOPIC CHOLECYSTECTOMY WITH POSSIBLE INTRAOPERATIVE CHOLANGIOGRAM;  Surgeon: Erroll Luna, MD;  Location: [place];  Service: General;  Laterality: N/A;   CORONARY STENT INTERVENTION N/A 05/12/2019   Procedure: CORONARY  STENT INTERVENTION;  Surgeon: Leonie Man, MD;  Location: [place] [facility];  Service: Cardiovascular;;; Culprit-p-mLAD 95% 950% SP1) --> Scoring Balloon PTCA -> DES PCI (Resolute DES 3.5 x 38 - 3.8 mm; 0% LAD& 55% SP1). -->  SP1 was somewhat jailed with TIMI II flow post PCI.   CORONARY STENT INTERVENTION N/A 07/02/2019   Procedure: CORONARY STENT INTERVENTION;  Surgeon: Leonie Man, MD;  Location: [place] [facility];  Service: Cardiovascular;; * 70% Proximal stent edge dissection (edge of previous stent that is patent) -->  successfully covered with  additional RESOLUTE ONYX DES 3.5 mm x 8 mm overlapping proximal edge of previous stent (postdilated to 3.8 mm)   GASTRIC BYPASS  11/2015   [facility]   GYNECOLOGIC CRYOSURGERY     HAND SURGERY     LEFT HEART CATH AND CORONARY ANGIOGRAPHY N/A 05/12/2019   Procedure: LEFT HEART CATH AND CORONARY ANGIOGRAPHY;  Surgeon: Leonie Man, MD;  Location: [place] [facility];  Service: Cardiovascular;;; Culprit-p-mLAD 95% 950% SP1) --> DES PCI.;  RPAV 60% -small caliber vessel, did not appear flow-limiting (medical management).  EF 55 to 60%.  Normal LVEDP.   LEFT HEART CATH AND CORONARY ANGIOGRAPHY N/A 07/02/2019   Procedure: LEFT HEART CATH AND CORONARY ANGIOGRAPHY;  Surgeon: Leonie Man, MD;  Location: [place] [facility];  Service: Cardiovascular;; CULPRIT = ~70% proximal edge dissection of previous stent (DES PCI overlapping stent placed) - jails SP1 w/ ~60-70% ostial stenosis.[name] mLAD 25%. RI 30%, RPAV 60%.   NM MYOVIEW LTD  06/24/2019   Carlton Adam): HIGH RISK.  EF 55%.  Large size, moderate severe defect in the anterior wall with associated anterior hypokinesis.  Suspect LAD territory.   NM MYOVIEW LTD  10/23/2019   Normal EF 55 to 60%.  No EKG changes.  Small size mild severity fixed defect in the apical wall consistent with breast attenuation versus small prior infarct.  Marked improvement from anterior apical perfusion abnormality seen in February 2020.   TRANSTHORACIC ECHOCARDIOGRAM  12/08/2020   Normal LV Fxn - EF 60-65%. No RWMA. Mod Asymmetric Basal Septa LVH with mild Concentric LVH. Gr II DD. Normal Longitudinal Strain. Normal RV size & fxn - normal RAP/CVP.  Normal MV. Mild AoV sclerosis w/ stenosis. Mild Ascending Ao dilation - ~38 mm (borderline).  => No change from 04/2017.   TUBAL LIGATION Bilateral    VAGINAL HYSTERECTOMY  2003   with LSO and right salpingectomy; right ovary still present    Prior to Admission medications   Medication Sig Start Date End Date Taking? Authorizing Provider   Cholecalciferol (VITAMIN D3) 125 MCG (5000 UT) TABS Take 5,000 Units by mouth every morning.     [provider]  clopidogrel (PLAVIX) 75 MG tablet TAKE 1 TABLET BY MOUTH EVERY DAY 11/29/20   Leonie Man, MD  losartan-hydrochlorothiazide [facility]) 100-12.5 MG tablet Take 0.5 tablets by mouth daily. 06/16/19   Leonie Man, MD  Multiple Vitamins-Minerals (ONE-A-DAY WOMENS 50+ ADVANTAGE PO) Take by mouth.    [provider]  nitroGLYCERIN (NITROSTAT) 0.4 MG SL tablet Place 1 tablet (0.4 mg total) under the tongue every 5 (five) minutes as needed. 05/12/19   Cheryln Manly, NP  pantoprazole (PROTONIX) 40 MG tablet Take 40 mg by mouth daily. 04/26/20   [provider]  propranolol (INDERAL) 10 MG tablet Take  5 mg to 10 mg  by mouth  as needed up to twice a day  for anxiety 04/12/21  Leonie Man, MD  rosuvastatin (CRESTOR) 40 MG tablet Take by mouth. 06/27/20   [provider]  sertraline (ZOLOFT) 50 MG tablet Take 50 mg by mouth daily. Patient not taking: Reported on 04/12/2021    [provider]    Current Facility-Administered Medications  Medication Dose Route Frequency Provider Last Rate Last Admin   0.9 %  sodium chloride infusion   Intravenous Continuous Isla Pence, MD 125 mL/hr at 07/15/21 0837 New Bag at 07/15/21 0837   pantoprazole (PROTONIX) 80 mg /NS 100 mL IVPB  80 mg Intravenous Once Isla Pence, MD       pantoprozole (PROTONIX) 80 mg /NS 100 mL infusion  8 mg/hr Intravenous Continuous Isla Pence, MD       Current Outpatient Medications  Medication Sig Dispense Refill   Cholecalciferol (VITAMIN D3) 125 MCG (5000 UT) TABS Take 5,000 Units by mouth every morning.      clopidogrel (PLAVIX) 75 MG tablet TAKE 1 TABLET BY MOUTH EVERY DAY 90 tablet 3   losartan-hydrochlorothiazide (HYZAAR) 100-12.5 MG tablet Take 0.5 tablets by mouth daily. 30 tablet 11   Multiple Vitamins-Minerals (ONE-A-DAY WOMENS 50+ ADVANTAGE PO)  Take by mouth.     nitroGLYCERIN (NITROSTAT) 0.4 MG SL tablet Place 1 tablet (0.4 mg total) under the tongue every 5 (five) minutes as needed. 25 tablet 2   pantoprazole (PROTONIX) 40 MG tablet Take 40 mg by mouth daily.     propranolol (INDERAL) 10 MG tablet Take  5 mg to 10 mg  by mouth  as needed up to twice a day  for anxiety 10 tablet 3   rosuvastatin (CRESTOR) 40 MG tablet Take by mouth.     sertraline (ZOLOFT) 50 MG tablet Take 50 mg by mouth daily. (Patient not taking: Reported on 04/12/2021)      Allergies as of 07/15/2021 - Review Complete 07/15/2021  Allergen Reaction Noted   Ceclor [cefaclor] Hives and Rash 06/12/2010   Tetracyclines & related Hives 07/29/2013   Lipitor [atorvastatin calcium] Cough 10/07/2011   Penicillins Swelling and Other (See Comments) 06/12/2010   Amoxicillin Rash 06/12/2010   Ampicillin Rash 06/12/2010   Lisinopril Cough 04/25/2012   Simvastatin Cough 09/22/2011    Family History  Problem Relation Age of Onset   Hypertension Mother    Cancer Father        lung   Alzheimer's disease Father    Migraines Father    Hyperlipidemia Sister    Hypertension Sister    Alzheimer's disease Paternal Grandmother    Cancer Paternal Grandfather        lung    Social History   Socioeconomic History   Marital status: Married    Spouse name: Alayia Meggison   Number of children: 2   Years of education: 12   Highest education level: High school graduate  Occupational History   Occupation: Retired Best boy: [employer]    Comment: Chief Executive Officer, Proofreader  Tobacco Use   Smoking status: Former    Packs/day: 0.10    Years: 30.00    Pack years: 3.00    Types: Cigarettes    Quit date: 04/26/2012    Years since quitting: 9.2   Smokeless tobacco: Never  Vaping Use   Vaping Use: Never used  Substance and Sexual Activity   Alcohol use: Yes    Comment: occasional   Drug use: No   Sexual activity: Yes    Partners: Male    Birth  control/protection:  Surgical    Comment: hysterectomy  Other Topics Concern   Not on file  Social History Narrative   ** Merged History Encounter **       Lives with husband [name]). She travels very frequently with her job and is gone most weeks out of the month. She is active and exercises when able.   Social Determinants of Health   Financial Resource Strain: Not on file  Food Insecurity: Not on file  Transportation Needs: Not on file  Physical Activity: Not on file  Stress: Not on file  Social Connections: Not on file  Intimate Partner Violence: Not on file    Review of Systems: Gen: Denies fever, sweats or chills. No weight loss.  CV: Denies chest pain, palpitations or edema. Resp: Denies cough, shortness of breath of hemoptysis.  GI: See HPI. GU : Denies urinary burning, blood in urine, increased urinary frequency or incontinence. MS: Denies joint pain, muscles aches or weakness. Derm: Denies rash, itchiness, skin lesions or unhealing ulcers. Psych: + Depression. Heme: Denies easy bruising, bleeding. Neuro:  Denies headaches, dizziness or paresthesias. Endo:  Denies any problems with DM, thyroid or adrenal function.  Physical Exam: Vital signs in last 24 hours: Temp:  [98.7 F (37.1 C)] 98.7 F (37.1 C) (12/24 0712) Pulse Rate:  [71] 71 (12/24 0712) Resp:  [18] 18 (12/24 0712) BP: (120)/(75) 120/75 (12/24 0712) SpO2:  [99 %] 99 % (12/24 0716) Weight:  [85.8 kg] 85.8 kg (12/24 0716)   General:  Alert 65 year old female in no acute distress. Head:  Normocephalic and atraumatic. Eyes:  No scleral icterus. Conjunctiva pink. Ears:  Normal auditory acuity. Nose:  No deformity, discharge or lesions. Mouth:  Dentition intact. No ulcers or lesions.  Neck:  Supple. No lymphadenopathy or thyromegaly.  Lungs: Breath sounds clear throughout. Heart: Regular rate and rhythm, no murmurs Abdomen: Soft, nondistended.  Mild epigastric and LUQ tenderness without rebound or  guarding.  Positive bowel sounds all 4 quadrants.  No obvious HSM. Rectal: Deferred. Musculoskeletal:  Symmetrical without gross deformities.  Pulses:  Normal pulses noted. Extremities:  Without clubbing or edema. Neurologic:  Alert and  oriented x4. No focal deficits.  Skin:  Intact without significant lesions or rashes. Psych:  Alert and cooperative. Normal mood and affect.  Intake/Output from previous day: No intake/output data recorded. Intake/Output this shift: No intake/output data recorded.  Lab Results: Recent Labs    07/15/21 0730  WBC 7.4  HGB 10.5*  HCT 31.4*  PLT 149*   BMET No results for input(s): NA, K, CL, CO2, GLUCOSE, BUN, CREATININE, CALCIUM in the last 72 hours. LFT No results for input(s): PROT, ALBUMIN, AST, ALT, ALKPHOS, BILITOT, BILIDIR, IBILI in the last 72 hours. PT/INR Recent Labs    07/15/21 0730  LABPROT 13.8  INR 1.1   Hepatitis Panel No results for input(s): HEPBSAG, HCVAB, HEPAIGM, HEPBIGM in the last 72 hours.    Studies/Results: DG Chest 1 View  Result Date: 07/15/2021 CLINICAL DATA:  Syncope, dizziness, shortness of breath EXAM: CHEST  1 VIEW COMPARISON:  05/29/2012 FINDINGS: The heart size and mediastinal contours are within normal limits. Both lungs are clear. The visualized skeletal structures are unremarkable. IMPRESSION: Negative. Electronically Signed   By: Rolm Baptise M.D.   On: 07/15/2021 08:04   DG Sacrum/Coccyx  Result Date: 07/15/2021 CLINICAL DATA:  Syncope, coccyx pain EXAM: SACRUM AND COCCYX - 2+ VIEW COMPARISON:  None. FINDINGS: There is no evidence of fracture or other focal bone lesions.  SI joints and hip joints symmetric. IMPRESSION: Negative. Electronically Signed   By: Rolm Baptise M.D.   On: 07/15/2021 08:04   CT HEAD WO CONTRAST  Result Date: 07/15/2021 CLINICAL DATA:  65 year old female with dizziness, altered mental status. EXAM: CT HEAD WITHOUT CONTRAST TECHNIQUE: Contiguous axial images were obtained  from the base of the skull through the vertex without intravenous contrast. COMPARISON:  Brain MRI 02/16/2014.  Head CT 02/08/2012. FINDINGS: Brain: Cerebral volume is within normal limits for age. Chronic partially empty sella. No midline shift, ventriculomegaly, mass effect, evidence of mass lesion, intracranial hemorrhage or evidence of cortically based acute infarction. Gray-white matter differentiation is within normal limits throughout the brain. Vascular: Calcified atherosclerosis at the skull base. No suspicious intracranial vascular hyperdensity. Skull: Chronic bilateral maxilla ORIF. No acute osseous abnormality identified. Sinuses/Orbits: Left maxillary sinus mucosal thickening is mild-to-moderate. Other Visualized paranasal sinuses and mastoids are stable and well aerated. Other: Visualized orbits and scalp soft tissues are within normal limits. IMPRESSION: 1. Stable and normal for age non contrast CT appearance of the brain. 2. Mild to moderate left maxillary sinus disease. Electronically Signed   By: Genevie Ann M.D.   On: 07/15/2021 07:52    IMPRESSION/PLAN:  65 year old female scented to the ED following 2 episodes of syncope at home.  Labs in the ED showed anemia with a hemoglobin level 10.5 with a baseline hemoglobin level of 13.7.  She demonstrated passing 4 melenic stools in the ED.  She denied any prior knowledge of passing black stools that she does not look at the stool prior to flushing. -NPO -Check H&H every 6 hours x24 hours -Continue PPI infusion -Hold Plavix -IV fluids pain management per the hospitalist -Ondansetron 4 mg p.o. or IV every 6 hours as needed -EGD to rule out anastomotic ulcer/PUD/UGI malignancy after Plavix washout  History of tubulovillous adenomatous polyp in 2011.  Follow-up colonoscopy 1 year was recommended but was not done.  Maternal grandmother with history of colon cancer. -Eventual colonoscopy at time of EGD during this hospitalization or as an  outpatient, await further recommendations per Dr. Rush Landmark  History of coronary artery disease s/p DES x 2 on Plavix.  Capitation's controlled on propanolol.  Twelve-lead EKG without acute ischemia. -Continue to hold Plavix    Patrecia Pour [facility]  07/15/2021, 10:53 AM

## 2021-07-15 NOTE — Anesthesia Preprocedure Evaluation (Signed)
Anesthesia Evaluation  Patient identified by MRN, date of birth, ID band Patient awake    Reviewed: Allergy & Precautions, NPO status , Patient's Chart, lab work & pertinent test results  Airway Mallampati: II  TM Distance: >3 FB Neck ROM: Full    Dental no notable dental hx.    Pulmonary sleep apnea , former smoker,    Pulmonary exam normal breath sounds clear to auscultation       Cardiovascular hypertension, + CAD and + Cardiac Stents  Normal cardiovascular exam Rhythm:Regular Rate:Normal     Neuro/Psych negative neurological ROS  negative psych ROS   GI/Hepatic negative GI ROS, Neg liver ROS,   Endo/Other  negative endocrine ROS  Renal/GU negative Renal ROS  negative genitourinary   Musculoskeletal negative musculoskeletal ROS (+)   Abdominal   Peds negative pediatric ROS (+)  Hematology  (+) anemia ,   Anesthesia Other Findings   Reproductive/Obstetrics negative OB ROS                             Anesthesia Physical Anesthesia Plan  ASA: 3  Anesthesia Plan: MAC   Post-op Pain Management:    Induction: Intravenous  PONV Risk Score and Plan: 2 and Propofol infusion and Treatment may vary due to age or medical condition  Airway Management Planned: Simple Face Mask  Additional Equipment:   Intra-op Plan:   Post-operative Plan:   Informed Consent: I have reviewed the patients History and Physical, chart, labs and discussed the procedure including the risks, benefits and alternatives for the proposed anesthesia with the patient or authorized representative who has indicated his/her understanding and acceptance.     Dental advisory given  Plan Discussed with: CRNA and Surgeon  Anesthesia Plan Comments:         Anesthesia Quick Evaluation

## 2021-07-15 NOTE — ED Triage Notes (Signed)
Pt reports syncopal episodes overnight, dizziness/SOB walking up stairs. EMS reports normal 12 lead and no orthostatics. Gluc 159. 140/90, HR 60, 99% on RA

## 2021-07-15 NOTE — Hospital Course (Addendum)
Had one BM this morning that was darker, but no bright red blood noted. Discussed EGD findings and plan going forward. Has not felt lightheaded or dizzy since arriving to the hospital.  Tolerated liquids well today. __________________________________________________  Carrie Reynolds night, went up stairs to go to bed but at the top of stairs started having tunnel vision and then passed out, doesn't remember. Started feeling better, but every time she started standing up she became dizzy and passed out again. This morning, had a BM that smelled bloody but did not visualize stools herself. This has never happened before. Has had 4 episodes of dark BM's total today.   Has charlie horse sensation/burn in upper stomach. This has been ongoing for over a year, not everyday. Takes protonix once a day for the past 2 years. No NSAID use. Has a history of gastric bypass and cholecystectomy. Maternal grandmother with history of colon cancer. Had last colonoscopy in 2011 with findings of polyps, but has not had a repeat colonoscopy since.  Last dose plavix was yesterday at 11AM.  No chest pain, palpitations. Has experienced syncopal events in the past, found to have CAD and has had 2 stents placed (October 2019 and December 2020). Follows Dr. Ellyn Hack (cardiology).   Occasional alcohol use, had a shot of liquor last night. Does not smoke cigarattes. Drinks THC juice every so often to help her sleep. No other illicit drug use.  Independent in ADLs, but somewhat dependent on iADLs on husband. Apprehensive about going outside of home given cardiac history.

## 2021-07-15 NOTE — Progress Notes (Signed)
OT Cancellation Note  Patient Details Name: Flecia Shutter MRN: [id number] DOB: [birth year]   Cancelled Treatment:    Reason Eval/Treat Not Completed: Patient at procedure or test/ unavailable (EGD currently due to GIB)  Jeri Modena 07/15/2021, 12:18 PM

## 2021-07-15 NOTE — Anesthesia Procedure Notes (Signed)
Procedure Name: MAC Date/Time: 07/15/2021 2:45 PM Performed by: Leonor Liv, CRNA Pre-anesthesia Checklist: Patient identified, Emergency Drugs available, Suction available, Patient being monitored and Timeout performed Patient Re-evaluated:Patient Re-evaluated prior to induction Oxygen Delivery Method: Simple face mask Induction Type: IV induction Placement Confirmation: positive ETCO2 and breath sounds checked- equal and bilateral Dental Injury: Teeth and Oropharynx as per pre-operative assessment

## 2021-07-15 NOTE — ED Provider Notes (Signed)
[facility] EMERGENCY DEPARTMENT Provider Note   CSN: [id number] Arrival date & time: 07/15/21  0710     History Chief Complaint  Patient presents with   Dizziness   Loss of Consciousness    Carrie Reynolds is a 65 y.o. female.  Pt presents to the ED today with syncope.  Pt had some dizziness and sob yesterday while walking up the stairs.  Pt said she had a syncopal event last night around 2000 on the stairs.  She had some chest squeezing.  She was found by her husband and brought into her room.  She woke up this am around 0600 and had to urinate.  She passed out again on her way to the bathroom.  She denies any cp now.  She feels that she's been eating and drinking well.  No f/c.  No n/v/d.  Pt does have some pain to her tailbone.  Otherwise, no other injuries.  She is not on blood thinners, but is on plavix.      Past Medical History:  Diagnosis Date   Anxiety    Arnold-Chiari malformation ([place]) 2006   CAD S/P PCI 05/06/2019   05/06/2019: prox LAD (@ SPI) PCI with Resolute Onyx DES 3.5  x 8, Residual 60% PDA, Normal LVF; 07/02/2019: relook Cath for Anterior Ischemia on [place] --> CATH 70% pre-stent/proximal edge dissection --> proximal overlapping DES (Resolute Onyx 3.5 x 8 - new & old stent post-dilated to 3.8 mm)   Cervical strain    syndrome   Cervicogenic headache    Concussion with loss of consciousness 10/09/2017   CTS (carpal tunnel syndrome)    Depression    GERD (gastroesophageal reflux disease)    Glucose intolerance (impaired glucose tolerance)    High coronary artery calcium score    Score of 1460.  Coronary CTA shows high-grade proximal-mid LAD stenosis (CT FFR 0.50).  Also PDA/PLA 60 to 70% stenosis (CTO FFR 0.75)   History of kidney stones    Hypertension    Migraine headache    with visual aura   Obesity    OSA (obstructive sleep apnea)    not treated   Postmenopausal    Vertigo    post concussive    Patient Active Problem  List   Diagnosis Date Noted   Sinus bradycardia 11/03/2019   Atypical angina ([place]) 10/12/2019   Fatigue 10/12/2019   Abnormal nuclear cardiac imaging test 07/02/2019   Presence of 2 overlapping Drug Coated Stents in LAD coronary artery    Coronary artery disease involving native coronary artery of native heart with angina pectoris ([place]) 06/17/2019   DOE (dyspnea on exertion) 06/16/2019   CAD S/P DES PCI-proximal LAD 05/20/2019   Abnormal cardiac CT angiography 05/12/2019   Agatston coronary artery calcium score greater than 400 04/16/2019   Migraine with vertigo 07/29/2018   Rapid palpitations 53/66/4403   Systolic murmur 47/42/5956   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   OSA on CPAP 11/19/2013   Hyperlipidemia with target LDL less than 70 07/29/2013   Obesity (BMI 30.0-34.9) 04/25/2012   Depression with anxiety 04/25/2012   Environmental allergies 04/25/2012   Essential (primary) hypertension 04/25/2012   Chiari malformation type I ([place]) 01/03/2012   Postmenopausal 09/18/2011    Past Surgical History:  Procedure Laterality Date   BUNIONECTOMY     right great toe   CHOLECYSTECTOMY N/A 06/05/2018   Procedure: LAPAROSCOPIC CHOLECYSTECTOMY WITH POSSIBLE INTRAOPERATIVE CHOLANGIOGRAM;  Surgeon: Erroll Luna, MD;  Location: [place];  Service: General;  Laterality: N/A;   CORONARY STENT INTERVENTION N/A 05/12/2019   Procedure: CORONARY STENT INTERVENTION;  Surgeon: Leonie Man, MD;  Location: [place] [facility];  Service: Cardiovascular;;; Culprit-p-mLAD 95% 950% SP1) --> Scoring Balloon PTCA -> DES PCI (Resolute DES 3.5 x 38 - 3.8 mm; 0% LAD& 55% SP1). -->  SP1 was somewhat jailed with TIMI II flow post PCI.   CORONARY STENT INTERVENTION N/A 07/02/2019   Procedure: CORONARY STENT INTERVENTION;  Surgeon: Leonie Man, MD;  Location: [place] [facility];  Service: Cardiovascular;; * 70% Proximal stent edge dissection (edge of previous stent that is patent) -->  successfully covered with  additional RESOLUTE ONYX DES 3.5 mm x 8 mm overlapping proximal edge of previous stent (postdilated to 3.8 mm)   GASTRIC BYPASS  11/2015   [facility]   GYNECOLOGIC CRYOSURGERY     HAND SURGERY     LEFT HEART CATH AND CORONARY ANGIOGRAPHY N/A 05/12/2019   Procedure: LEFT HEART CATH AND CORONARY ANGIOGRAPHY;  Surgeon: Leonie Man, MD;  Location: [place] [facility];  Service: Cardiovascular;;; Culprit-p-mLAD 95% 950% SP1) --> DES PCI.;  RPAV 60% -small caliber vessel, did not appear flow-limiting (medical management).  EF 55 to 60%.  Normal LVEDP.   LEFT HEART CATH AND CORONARY ANGIOGRAPHY N/A 07/02/2019   Procedure: LEFT HEART CATH AND CORONARY ANGIOGRAPHY;  Surgeon: Leonie Man, MD;  Location: [place] [facility];  Service: Cardiovascular;; CULPRIT = ~70% proximal edge dissection of previous stent (DES PCI overlapping stent placed) - jails SP1 w/ ~60-70% ostial stenosis.[name] mLAD 25%. RI 30%, RPAV 60%.   NM MYOVIEW LTD  06/24/2019   Carlton Adam): HIGH RISK.  EF 55%.  Large size, moderate severe defect in the anterior wall with associated anterior hypokinesis.  Suspect LAD territory.   NM MYOVIEW LTD  10/23/2019   Normal EF 55 to 60%.  No EKG changes.  Small size mild severity fixed defect in the apical wall consistent with breast attenuation versus small prior infarct.  Marked improvement from anterior apical perfusion abnormality seen in February 2020.   TRANSTHORACIC ECHOCARDIOGRAM  12/08/2020   Normal LV Fxn - EF 60-65%. No RWMA. Mod Asymmetric Basal Septa LVH with mild Concentric LVH. Gr II DD. Normal Longitudinal Strain. Normal RV size & fxn - normal RAP/CVP.  Normal MV. Mild AoV sclerosis w/ stenosis. Mild Ascending Ao dilation - ~38 mm (borderline).  => No change from 04/2017.   TUBAL LIGATION Bilateral    VAGINAL HYSTERECTOMY  2003   with LSO and right salpingectomy; right ovary still present     OB History     Gravida  2   Para  2   Term  2   Preterm      AB      Living  2       SAB      IAB      Ectopic      Multiple      Live Births  2           Family History  Problem Relation Age of Onset   Hypertension Mother    Cancer Father        lung   Alzheimer's disease Father    Migraines Father    Hyperlipidemia Sister    Hypertension Sister    Alzheimer's disease Paternal Grandmother    Cancer Paternal Grandfather        lung    Social History  Tobacco Use   Smoking status: Former    Packs/day: 0.10    Years: 30.00    Pack years: 3.00    Types: Cigarettes    Quit date: 04/26/2012    Years since quitting: 9.2   Smokeless tobacco: Never  Vaping Use   Vaping Use: Never used  Substance Use Topics   Alcohol use: Yes    Comment: occasional   Drug use: No    Home Medications Prior to Admission medications   Medication Sig Start Date End Date Taking? Authorizing Provider  Cholecalciferol (VITAMIN D3) 125 MCG (5000 UT) TABS Take 5,000 Units by mouth every morning.     [provider]  clopidogrel (PLAVIX) 75 MG tablet TAKE 1 TABLET BY MOUTH EVERY DAY 11/29/20   Leonie Man, MD  losartan-hydrochlorothiazide [facility]) 100-12.5 MG tablet Take 0.5 tablets by mouth daily. 06/16/19   Leonie Man, MD  Multiple Vitamins-Minerals (ONE-A-DAY WOMENS 50+ ADVANTAGE PO) Take by mouth.    [provider]  nitroGLYCERIN (NITROSTAT) 0.4 MG SL tablet Place 1 tablet (0.4 mg total) under the tongue every 5 (five) minutes as needed. 05/12/19   Cheryln Manly, NP  pantoprazole (PROTONIX) 40 MG tablet Take 40 mg by mouth daily. 04/26/20   [provider]  propranolol (INDERAL) 10 MG tablet Take  5 mg to 10 mg  by mouth  as needed up to twice a day  for anxiety 04/12/21   Leonie Man, MD  rosuvastatin (CRESTOR) 40 MG tablet Take by mouth. 06/27/20   [provider]  sertraline (ZOLOFT) 50 MG tablet Take 50 mg by mouth daily. Patient not taking: Reported on 04/12/2021    [provider]    Allergies     Ceclor [cefaclor], Tetracyclines & related, Lipitor [atorvastatin calcium], Penicillins, Amoxicillin, Ampicillin, Lisinopril, and Simvastatin  Review of Systems   Review of Systems  Respiratory:  Positive for shortness of breath.   Cardiovascular:  Positive for chest pain.  Neurological:  Positive for syncope.  All other systems reviewed and are negative.  Physical Exam Updated Vital Signs BP 117/64    Pulse 75    Temp 98.7 F (37.1 C) (Oral)    Resp 14    Ht 5\' 7"  (1.702 m)    Wt 85.8 kg    LMP 07/24/2003 (Within Months)    SpO2 99%    BMI 29.63 kg/m   Physical Exam Vitals and nursing note reviewed.  Constitutional:      Appearance: Normal appearance. She is obese.  HENT:     Head: Normocephalic and atraumatic.     Right Ear: External ear normal.     Left Ear: External ear normal.     Nose: Nose normal.     Mouth/Throat:     Mouth: Mucous membranes are moist.     Pharynx: Oropharynx is clear.  Eyes:     Extraocular Movements: Extraocular movements intact.     Conjunctiva/sclera: Conjunctivae normal.     Pupils: Pupils are equal, round, and reactive to light.  Cardiovascular:     Rate and Rhythm: Normal rate and regular rhythm.     Pulses: Normal pulses.     Heart sounds: Normal heart sounds.  Pulmonary:     Effort: Pulmonary effort is normal.     Breath sounds: Normal breath sounds.  Abdominal:     General: Abdomen is flat. Bowel sounds are normal.     Palpations: Abdomen is soft.  Musculoskeletal:  General: Normal range of motion.     Cervical back: Normal range of motion and neck supple.  Skin:    General: Skin is warm.     Capillary Refill: Capillary refill takes less than 2 seconds.  Neurological:     General: No focal deficit present.     Mental Status: She is alert and oriented to person, place, and time.  Psychiatric:        Mood and Affect: Mood normal.        Behavior: Behavior normal.    ED Results / Procedures / Treatments   Labs (all labs  ordered are listed, but only abnormal results are displayed) Labs Reviewed  CBC WITH DIFFERENTIAL/PLATELET - Abnormal; Notable for the following components:      Result Value   RBC 3.53 (*)    Hemoglobin 10.5 (*)    HCT 31.4 (*)    Platelets 149 (*)    All other components within normal limits  COMPREHENSIVE METABOLIC PANEL - Abnormal; Notable for the following components:   CO2 21 (*)    Glucose, Bld 126 (*)    BUN 39 (*)    Total Protein 5.9 (*)    All other components within normal limits  URINALYSIS, ROUTINE W REFLEX MICROSCOPIC - Abnormal; Notable for the following components:   Hgb urine dipstick TRACE (*)    All other components within normal limits  URINALYSIS, MICROSCOPIC (REFLEX) - Abnormal; Notable for the following components:   Bacteria, UA RARE (*)    All other components within normal limits  CBG MONITORING, ED - Abnormal; Notable for the following components:   Glucose-Capillary 125 (*)    All other components within normal limits  POC OCCULT BLOOD, ED - Abnormal; Notable for the following components:   Fecal Occult Bld POSITIVE (*)    All other components within normal limits  RESP PANEL BY RT-PCR (FLU A&B, COVID) [id number]  PROTIME-INR  TYPE AND SCREEN  TROPONIN I (HIGH SENSITIVITY)  TROPONIN I (HIGH SENSITIVITY)    EKG EKG Interpretation  Date/Time:  Saturday July 15 2021 07:11:13 EST Ventricular Rate:  70 PR Interval:  144 QRS Duration: 97 QT Interval:  416 QTC Calculation: 449 R Axis:   53 Text Interpretation: Sinus rhythm No significant change since last tracing Confirmed by Isla Pence [phone]) on 07/15/2021 7:20:06 AM  Radiology DG Chest 1 View  Result Date: 07/15/2021 CLINICAL DATA:  Syncope, dizziness, shortness of breath EXAM: CHEST  1 VIEW COMPARISON:  05/29/2012 FINDINGS: The heart size and mediastinal contours are within normal limits. Both lungs are clear. The visualized skeletal structures are unremarkable. IMPRESSION: Negative.  Electronically Signed   By: Rolm Baptise M.D.   On: 07/15/2021 08:04   DG Sacrum/Coccyx  Result Date: 07/15/2021 CLINICAL DATA:  Syncope, coccyx pain EXAM: SACRUM AND COCCYX - 2+ VIEW COMPARISON:  None. FINDINGS: There is no evidence of fracture or other focal bone lesions. SI joints and hip joints symmetric. IMPRESSION: Negative. Electronically Signed   By: Rolm Baptise M.D.   On: 07/15/2021 08:04   CT HEAD WO CONTRAST  Result Date: 07/15/2021 CLINICAL DATA:  65 year old female with dizziness, altered mental status. EXAM: CT HEAD WITHOUT CONTRAST TECHNIQUE: Contiguous axial images were obtained from the base of the skull through the vertex without intravenous contrast. COMPARISON:  Brain MRI 02/16/2014.  Head CT 02/08/2012. FINDINGS: Brain: Cerebral volume is within normal limits for age. Chronic partially empty sella. No midline shift, ventriculomegaly, mass effect, evidence of mass lesion,  intracranial hemorrhage or evidence of cortically based acute infarction. Gray-white matter differentiation is within normal limits throughout the brain. Vascular: Calcified atherosclerosis at the skull base. No suspicious intracranial vascular hyperdensity. Skull: Chronic bilateral maxilla ORIF. No acute osseous abnormality identified. Sinuses/Orbits: Left maxillary sinus mucosal thickening is mild-to-moderate. Other Visualized paranasal sinuses and mastoids are stable and well aerated. Other: Visualized orbits and scalp soft tissues are within normal limits. IMPRESSION: 1. Stable and normal for age non contrast CT appearance of the brain. 2. Mild to moderate left maxillary sinus disease. Electronically Signed   By: Genevie Ann M.D.   On: 07/15/2021 07:52    Procedures Procedures   Medications Ordered in ED Medications  sodium chloride 0.9 % bolus 1,000 mL (1,000 mLs Intravenous New Bag/Given 07/15/21 0837)    And  0.9 %  sodium chloride infusion ( Intravenous New Bag/Given 07/15/21 0837)  pantoprozole  (PROTONIX) 80 mg /NS 100 mL infusion (8 mg/hr Intravenous New Bag/Given 07/15/21 0858)  pantoprazole (PROTONIX) 80 mg /NS 100 mL IVPB (80 mg Intravenous New Bag/Given 07/15/21 0856)    ED Course  I have reviewed the triage vital signs and the nursing notes.  Pertinent labs & imaging results that were available during my care of the patient were reviewed by me and considered in my medical decision making (see chart for details).    MDM Rules/Calculators/A&P                         Pt had a large melanotic stool here.  Stool is strongly guaiac +.  Hgb is 10.5 down from 13.7.  The rest of the work up has all been negative.  Pt given a protonix bolus and is started on a drip.  Pt d/w [facility] who will see pt in consult.   Pt d/w IMTS for admission.  CRITICAL CARE Performed by: Isla Pence   Total critical care time: 30 minutes  Critical care time was exclusive of separately billable procedures and treating other patients.  Critical care was necessary to treat or prevent imminent or life-threatening deterioration.  Critical care was time spent personally by me on the following activities: development of treatment plan with patient and/or surrogate as well as nursing, discussions with consultants, evaluation of patient's response to treatment, examination of patient, obtaining history from patient or surrogate, ordering and performing treatments and interventions, ordering and review of laboratory studies, ordering and review of radiographic studies, pulse oximetry and re-evaluation of patient's condition.   Final Clinical Impression(s) / ED Diagnoses Final diagnoses:  Syncope, unspecified syncope type  UGI bleed  On clopidogrel therapy    Rx / DC Orders ED Discharge Orders     None        Isla Pence, MD 07/15/21 [phone]

## 2021-07-15 NOTE — Interval H&P Note (Signed)
History and Physical Interval Note:  07/15/2021 2:45 PM  Carrie Reynolds  has presented today for surgery, with the diagnosis of GI bleed, anemia, melena.  The various methods of treatment have been discussed with the patient and family. After consideration of risks, benefits and other options for treatment, the patient has consented to  Procedure(s): ESOPHAGOGASTRODUODENOSCOPY (EGD) WITH PROPOFOL (N/A) as a surgical intervention.  The patient's history has been reviewed, patient examined, no change in status, stable for surgery.  I have reviewed the patient's chart and labs.  Questions were answered to the patient's satisfaction.     [employer]

## 2021-07-15 NOTE — H&P (Addendum)
Date: 07/15/2021               Patient Name:  Carrie Reynolds MRN: [id number]  DOB: Aug 26, 1955 Age / Sex: 65 y.o., female   PCP: Leonard Downing, MD         Medical Service: Internal Medicine Teaching Service         Attending Physician: Dr. Jimmye Norman Elaina Pattee, MD    First Contact: Dr. Wayland Denis Pager: [phone]  Second Contact: Dr. Virl Axe Pager: [phone]       After Hours (After 5p/  First Contact Pager: [phone]  weekends / holidays): Second Contact Pager: [phone]   Chief Complaint: Syncope  History of Present Illness:   Carrie Reynolds is a 65 y.o. Caucasian female with a PMHx of HTN, HLD, CAD s/p DES x 2 2019/2020 on Plavix, OSA, chronic sinus bradycardia, palpitations, atypical angina, GERD, S/P gastric bypass surgery at [facility] in 2017, s/p cholecystectomy, Depression, who presents for syncopal episodes.   Patient reports history of palpitations and presyncopal symptoms, follows closely with cardiology for CAD, chronic sinus bradycardia with palpitations.  She reports yesterday evening, she went upstairs to go to bed and at the top of the stairs started having tunnel vision, was able to ambulate some down the hall but then passed out.  Patient went to bed that night and noted that each time she would sit up and try to get out of bed she would feel dizzy and feel the need to pass out again.  This morning patient got up to use the restroom and had second syncopal episode.  She did use the restroom this morning and remembers strange smelling stool but did not visualize the stool herself.   After the second syncopal episode, husband called EMS and patient presented to the ED for further evaluation.  In the ED the patient passed 4 loose dark/black stools.  Reports she has never had black stools before to her knowledge.  She was noted to have a hemoglobin 10.5, last documented hemoglobin 02/2020 was 12.9.  Was 13.7 in 10/2019. Last dose of Plavix was yesterday at  11AM. Patient endorses history of GERD for which she takes Protonix 1 tablet daily at home for the past 2 years.  She denies use of NSAIDs, Goody powders.  She reports occasional alcohol use, shot or drink, a few times a month, had a shot of liquor last night.  She has been having substernal pain most days for the past year which she has mentioned to her cardiologist Dr. Ellyn Hack.  Her pain improves after she eats food.  She describes the pain as a burning pain. No dysphagia.  This morning she denies chest pain, palpitations, abdominal pain, nausea, vomiting, reports feeling fatigued.  Notes her blood pressures have been low this morning, notes blood pressures are low at home as well.  She underwent a colonoscopy by Dr. Sharlett Iles in 2011, 4 hyperplastic polyps and one tubulovillous adenomatous polyp was removed from the sigmoid colon.  She was advised to repeat a colonoscopy in 1 year which was not done.  Has maternal grandmother with history of colon cancer.  ED: Afebrile hemodynamically stable.  Blood pressures 734L-937T systolic.  CBC notable for normocytic anemia with hemoglobin 10.5.  Otherwise lab work fairly unremarkable.  EKG NSR, troponin 4. Fecal occult blood test positive.  Patient noted to have dark stools.  GI consulted for suspected UGIB.  Patient was started on Protonix infusion.  Given NS  1L bolus, maintenance fluids. Medicine consulted for admission.  Meds:  Current Outpatient Medications  Medication Instructions   clopidogrel (PLAVIX) 75 MG tablet TAKE 1 TABLET BY MOUTH EVERY DAY   losartan-hydrochlorothiazide (HYZAAR) 100-12.5 MG tablet 0.5 tablets, Oral, Daily   Multiple Vitamins-Minerals (ONE-A-DAY WOMENS 50+ ADVANTAGE PO) Oral   nitroGLYCERIN (NITROSTAT) 0.4 mg, Sublingual, Every 5 min PRN   pantoprazole (PROTONIX) 40 mg, Oral, Daily   rosuvastatin (CRESTOR) 40 MG tablet Oral   sertraline (ZOLOFT) 50 mg, Daily   Vitamin D3 5,000 Units, Oral, Every morning     Allergies: Allergies as of 07/15/2021 - Review Complete 07/15/2021  Allergen Reaction Noted   Ceclor [cefaclor] Hives and Rash 06/12/2010   Tetracyclines & related Hives 07/29/2013   Lipitor [atorvastatin calcium] Cough 10/07/2011   Penicillins Swelling and Other (See Comments) 06/12/2010   Amoxicillin Rash 06/12/2010   Ampicillin Rash 06/12/2010   Lisinopril Cough 04/25/2012   Simvastatin Cough 09/22/2011    Past Medical History:  Diagnosis Date   Anxiety    Arnold-Chiari malformation ([place]) 2006   CAD S/P PCI 05/06/2019   05/06/2019: prox LAD (@ SPI) PCI with Resolute Onyx DES 3.5  x 8, Residual 60% PDA, Normal LVF; 07/02/2019: relook Cath for Anterior Ischemia on Mhoview --> CATH 70% pre-stent/proximal edge dissection --> proximal overlapping DES (Resolute Onyx 3.5 x 8 - new & old stent post-dilated to 3.8 mm)   Cervical strain    syndrome   Cervicogenic headache    Concussion with loss of consciousness 10/09/2017   CTS (carpal tunnel syndrome)    Depression    GERD (gastroesophageal reflux disease)    Glucose intolerance (impaired glucose tolerance)    High coronary artery calcium score    Score of 1460.  Coronary CTA shows high-grade proximal-mid LAD stenosis (CT FFR 0.50).  Also PDA/PLA 60 to 70% stenosis (CTO FFR 0.75)   History of kidney stones    Hypertension    Migraine headache    with visual aura   Obesity    OSA (obstructive sleep apnea)    not treated   Postmenopausal    Vertigo    post concussive    Family History:  Family History  Problem Relation Age of Onset   Hypertension Mother    Lung cancer Father    Alzheimer's disease Father    Migraines Father    Hyperlipidemia Sister    Hypertension Sister    Colon cancer Maternal Grandmother    Heart failure Maternal Grandfather    Alzheimer's disease Paternal Grandmother    Lung cancer Paternal Grandfather     Social History:  Patient lives in [place] with her husband.  Has a daughter who is  currently visiting. Independent in ADLs, but somewhat dependent on iADLs on husband. Apprehensive about going outside of home given cardiac history.  When she does ambulate in the community she does not require an assistive device. Occasional alcohol use, had a shot of liquor last night. Does not smoke cigarattes. Drinks THC juice every so often to help her sleep. No other illicit drug use.  Review of Systems: A complete ROS was negative except as per HPI.   Physical Exam: Blood pressure 116/65, pulse 76, temperature 98.7 F (37.1 C), temperature source Oral, resp. rate 14, height 5\' 7"  (1.702 m), weight 85.8 kg, last menstrual period 07/24/2003, SpO2 96 %. Physical Exam: General: Well appearing overweight Caucasian female, NAD HENT: normocephalic, atraumatic EYES: conjunctiva non-erythematous, no scleral icterus CV: regular rate, normal rhythm,  no murmurs, rubs, gallops.  Trace lower extremity edema. Pulmonary: normal work of breathing on RA, lungs clear to auscultation, no rales, wheezes, rhonchi Abdominal: non-distended, soft, mild tenderness to palpation epigastric area, normal BS Skin: Warm and dry, no rashes or lesions on exposed skin surfaces Neurological: MS: awake, alert and oriented x3, normal speech and fund of knowledge Motor: moves all extremities antigravity Psych: normal affect  CBC    Component Value Date/Time   WBC 7.4 07/15/2021 0730   RBC 3.53 (L) 07/15/2021 0730   HGB 10.5 (L) 07/15/2021 0730   HGB 13.7 10/23/2019 0843   HCT 31.4 (L) 07/15/2021 0730   HCT 40.3 10/23/2019 0843   PLT 149 (L) 07/15/2021 0730   PLT 183 10/23/2019 0843   MCV 89.0 07/15/2021 0730   MCV 88 10/23/2019 0843   MCH 29.7 07/15/2021 0730   MCHC 33.4 07/15/2021 0730   RDW 13.6 07/15/2021 0730   RDW 13.0 10/23/2019 0843   LYMPHSABS 1.7 07/15/2021 0730   MONOABS 0.4 07/15/2021 0730   EOSABS 0.0 07/15/2021 0730   BASOSABS 0.0 07/15/2021 0730   CMP     Component Value Date/Time   NA  138 07/15/2021 0730   NA 141 06/24/2020 1152   K 3.5 07/15/2021 0730   CL 107 07/15/2021 0730   CO2 21 (L) 07/15/2021 0730   GLUCOSE 126 (H) 07/15/2021 0730   BUN 39 (H) 07/15/2021 0730   BUN 18 06/24/2020 1152   CREATININE 0.89 07/15/2021 0730   CREATININE 0.81 09/17/2012 1711   CALCIUM 9.2 07/15/2021 0730   PROT 5.9 (L) 07/15/2021 0730   PROT 7.0 06/24/2020 1152   ALBUMIN 3.5 07/15/2021 0730   ALBUMIN 4.3 06/24/2020 1152   AST 16 07/15/2021 0730   ALT 18 07/15/2021 0730   ALKPHOS 46 07/15/2021 0730   BILITOT 0.9 07/15/2021 0730   BILITOT 0.5 06/24/2020 1152   GFRNONAA >60 07/15/2021 0730   GFRAA 87 06/24/2020 1152    EKG: personally reviewed my interpretation is NSR  CXR: personally reviewed my interpretation is no acute cardiopulmonary abnormality  SACRUM AND COCCYX DG: No evidence of fracture or focal bone lesions.  CT HEAD WITHOUT CONTRAST: Stable and normal for age with mild to moderate left maxillary sinus disease.  Assessment & Plan by Problem: Principal Problem:   Syncope  Carrie Reynolds is a 65 y.o. Caucasian female with a PMHx of HTN, HLD, CAD s/p DES x 2 2019/2020 on Plavix, OSA, chronic sinus bradycardia, palpitations, atypical angina, GERD, S/P gastric bypass surgery at [facility] in 2017, s/p cholecystectomy, Depression, who presents for syncopal episodes.   #Upper GI Bleed #Acute Blood Loss Anemia #GERD Patient presented with two syncopal episodes, had x4 loose melenic stools in the ED, with hemoglobin 10.5, down from 12.9 in 02/2020. Acute blood loss anemia likely secondary to upper GI bleed given melena. GI consulted for EGD. Differential also included cardiac cause of syncope though EKG with NSR, troponins flat, pt denying new or worsening chest pain and palpitations. Patient was started on Protonix infusion.  S/p 1L NS, started on maintenance fluids.  GI to perform EGD to rule out anastomotic ulcer/PUD/UGI malignancy. Plan: -GI following, appreciate  recommendations -N.p.o. in anticipation of EGD -Protonix infusion -Holding Plavix in anticipation of EGD, last dose 11 AM 12/23 -125 cc/hour NS continuous -CBC twice daily  #Hx of tubulovillous adenomatous polyp in 2011 #History of colon cancer 2011 colonoscopy by Dr. Boykin Reaper identified for hyperplastic polyps and one tubulovillous adenomatous polyp which  were resected from the sigmoid colon.  Patient was advised to repeat colonoscopy in 1 year which was not done. Plan: -Possible colonoscopy at time of EGD versus outpatient, awaiting further recommendations from GI  #CAD s/p LAD DES x 2 2019/2020 #HTN #HLD #Hx chronic sinus bradycardia, with PACs #Hx Rapid Palpitations Patient follows with Dr. Ellyn Hack, cardiologist.  Last Ada 03/2021.  Last Myoview 10/2019 no ischemia, low risk.  New medications: Clopidogrel 75 mg daily, losartan-hydrochlorothiazide 100-12.5 mg daily, rosuvastatin 40 mg daily.  Patient reports not currently taking propranolol for palpitations.  Cardiology believes palpitations primarily anxiety driven, had ordered propranolol as as needed. Plan: -Holding Plavix for EGD -Continue rosuvastatin 40 mg daily -Holding losartan-hydrochlorothiazide in the setting of soft Bps  #Depression with anxiety Home medication Zoloft 50 mg daily was continued this admission.  Patient also has prescription for propranolol as needed for anxiety and palpitations, however patient not currently taking this medication and afraid to take this medication, will take off medication list.  Diet: N.p.o. for EGD VTE: Holding for EGD IVF: 125 cc/hour continuous NS Code: Full  Dispo: Admit patient to Inpatient with expected length of stay greater than 2 midnights.  Portions of this report may have been transcribed using voice recognition software. Every effort was made to ensure accuracy; however, inadvertent computerized transcription errors may be present.   Wayland Denis, MD 07/15/21,  2:22  PM Pager: [phone] Internal Medicine Resident, PGY-1 [facility]   After 5pm on weekdays and 1pm on weekends: On Call pager: [phone]

## 2021-07-15 NOTE — H&P (View-Only) (Signed)
Referring Provider: Dr. Isla Pence Primary Care Physician:  Leonard Downing, MD Primary Gastroenterologist:  Dr.[facility] 2011  Reason for Consultation: Upper GI bleed/melena, anemia  HPI: Carrie Reynolds is a 65 y.o. female with a past medical history of depression, hypertension, CAD s/p DES x 2 2020 on Plavix, obstructive sleep apnea, GERD S/P gastric bypass surgery at [facility] in 2017. She developed dizziness with shortness of breath while walking up the stairs and sequently had a syncopal episode yesterday evening. She was found down by her husband who assisted her to bed.  She awakened at 6 AM today to urinate and she passed out on the way to the bathroom.  Her husband called 24 and she presented to [facility] ED for further evaluation.  Labs in the ED showed hemoglobin 10.5 (baseline hemoglobin 13.7).  Elevated BUN 39. Her last dose of Plavix was taken at 11 AM on 07/14/2021. She was started Protonix bolus followed by infusion.  She passed 4 loose melenic stools since arriving to the ED.  The patient denied any prior knowledge that her stools were black, she does not look at her stool prior to flushing the toilet.  A GI consult was requested for further evaluation.  She endorses having lower sternal/epigastric pain most days for the past year.  She reported seeing her cardiologist Dr. Ellyn Hack regarding this chest pain and apparently there was no concern that this was cardiac pain.  Eating food reduces this pain.  No dysphagia or classic heartburn symptoms.  She takes Pantoprazole 40 mg p.o. daily for the past 2 years.  No NSAIDs.  She denies ever having an EGD.  She underwent a colonoscopy by Dr. Sharlett Iles in 2011, 4 hyperplastic polyps and one tubulovillous adenomatous polyp was removed from the sigmoid colon.  She was advised to repeat a colonoscopy in 1 year which was not done.  No grandmother with history of colon cancer.  Past Medical History:  Diagnosis Date    Anxiety    Arnold-Chiari malformation ([place]) 2006   CAD S/P PCI 05/06/2019   05/06/2019: prox LAD (@ SPI) PCI with Resolute Onyx DES 3.5  x 8, Residual 60% PDA, Normal LVF; 07/02/2019: relook Cath for Anterior Ischemia on [place] --> CATH 70% pre-stent/proximal edge dissection --> proximal overlapping DES (Resolute Onyx 3.5 x 8 - new & old stent post-dilated to 3.8 mm)   Cervical strain    syndrome   Cervicogenic headache    Concussion with loss of consciousness 10/09/2017   CTS (carpal tunnel syndrome)    Depression    GERD (gastroesophageal reflux disease)    Glucose intolerance (impaired glucose tolerance)    High coronary artery calcium score    Score of 1460.  Coronary CTA shows high-grade proximal-mid LAD stenosis (CT FFR 0.50).  Also PDA/PLA 60 to 70% stenosis (CTO FFR 0.75)   History of kidney stones    Hypertension    Migraine headache    with visual aura   Obesity    OSA (obstructive sleep apnea)    not treated   Postmenopausal    Vertigo    post concussive    Past Surgical History:  Procedure Laterality Date   BUNIONECTOMY     right great toe   CHOLECYSTECTOMY N/A 06/05/2018   Procedure: LAPAROSCOPIC CHOLECYSTECTOMY WITH POSSIBLE INTRAOPERATIVE CHOLANGIOGRAM;  Surgeon: Erroll Luna, MD;  Location: [place];  Service: General;  Laterality: N/A;   CORONARY STENT INTERVENTION N/A 05/12/2019   Procedure: CORONARY  STENT INTERVENTION;  Surgeon: Leonie Man, MD;  Location: [place] [facility];  Service: Cardiovascular;;; Culprit-p-mLAD 95% 950% SP1) --> Scoring Balloon PTCA -> DES PCI (Resolute DES 3.5 x 38 - 3.8 mm; 0% LAD& 55% SP1). -->  SP1 was somewhat jailed with TIMI II flow post PCI.   CORONARY STENT INTERVENTION N/A 07/02/2019   Procedure: CORONARY STENT INTERVENTION;  Surgeon: Leonie Man, MD;  Location: [place] [facility];  Service: Cardiovascular;; * 70% Proximal stent edge dissection (edge of previous stent that is patent) -->  successfully covered with  additional RESOLUTE ONYX DES 3.5 mm x 8 mm overlapping proximal edge of previous stent (postdilated to 3.8 mm)   GASTRIC BYPASS  11/2015   [facility]   GYNECOLOGIC CRYOSURGERY     HAND SURGERY     LEFT HEART CATH AND CORONARY ANGIOGRAPHY N/A 05/12/2019   Procedure: LEFT HEART CATH AND CORONARY ANGIOGRAPHY;  Surgeon: Leonie Man, MD;  Location: [place] [facility];  Service: Cardiovascular;;; Culprit-p-mLAD 95% 950% SP1) --> DES PCI.;  RPAV 60% -small caliber vessel, did not appear flow-limiting (medical management).  EF 55 to 60%.  Normal LVEDP.   LEFT HEART CATH AND CORONARY ANGIOGRAPHY N/A 07/02/2019   Procedure: LEFT HEART CATH AND CORONARY ANGIOGRAPHY;  Surgeon: Leonie Man, MD;  Location: [place] [facility];  Service: Cardiovascular;; CULPRIT = ~70% proximal edge dissection of previous stent (DES PCI overlapping stent placed) - jails SP1 w/ ~60-70% ostial stenosis.[name] mLAD 25%. RI 30%, RPAV 60%.   NM MYOVIEW LTD  06/24/2019   Carlton Adam): HIGH RISK.  EF 55%.  Large size, moderate severe defect in the anterior wall with associated anterior hypokinesis.  Suspect LAD territory.   NM MYOVIEW LTD  10/23/2019   Normal EF 55 to 60%.  No EKG changes.  Small size mild severity fixed defect in the apical wall consistent with breast attenuation versus small prior infarct.  Marked improvement from anterior apical perfusion abnormality seen in February 2020.   TRANSTHORACIC ECHOCARDIOGRAM  12/08/2020   Normal LV Fxn - EF 60-65%. No RWMA. Mod Asymmetric Basal Septa LVH with mild Concentric LVH. Gr II DD. Normal Longitudinal Strain. Normal RV size & fxn - normal RAP/CVP.  Normal MV. Mild AoV sclerosis w/ stenosis. Mild Ascending Ao dilation - ~38 mm (borderline).  => No change from 04/2017.   TUBAL LIGATION Bilateral    VAGINAL HYSTERECTOMY  2003   with LSO and right salpingectomy; right ovary still present    Prior to Admission medications   Medication Sig Start Date End Date Taking? Authorizing Provider   Cholecalciferol (VITAMIN D3) 125 MCG (5000 UT) TABS Take 5,000 Units by mouth every morning.     [provider]  clopidogrel (PLAVIX) 75 MG tablet TAKE 1 TABLET BY MOUTH EVERY DAY 11/29/20   Leonie Man, MD  losartan-hydrochlorothiazide [facility]) 100-12.5 MG tablet Take 0.5 tablets by mouth daily. 06/16/19   Leonie Man, MD  Multiple Vitamins-Minerals (ONE-A-DAY WOMENS 50+ ADVANTAGE PO) Take by mouth.    [provider]  nitroGLYCERIN (NITROSTAT) 0.4 MG SL tablet Place 1 tablet (0.4 mg total) under the tongue every 5 (five) minutes as needed. 05/12/19   Cheryln Manly, NP  pantoprazole (PROTONIX) 40 MG tablet Take 40 mg by mouth daily. 04/26/20   [provider]  propranolol (INDERAL) 10 MG tablet Take  5 mg to 10 mg  by mouth  as needed up to twice a day  for anxiety 04/12/21  Leonie Man, MD  rosuvastatin (CRESTOR) 40 MG tablet Take by mouth. 06/27/20   [provider]  sertraline (ZOLOFT) 50 MG tablet Take 50 mg by mouth daily. Patient not taking: Reported on 04/12/2021    [provider]    Current Facility-Administered Medications  Medication Dose Route Frequency Provider Last Rate Last Admin   0.9 %  sodium chloride infusion   Intravenous Continuous Isla Pence, MD 125 mL/hr at 07/15/21 0837 New Bag at 07/15/21 0837   pantoprazole (PROTONIX) 80 mg /NS 100 mL IVPB  80 mg Intravenous Once Isla Pence, MD       pantoprozole (PROTONIX) 80 mg /NS 100 mL infusion  8 mg/hr Intravenous Continuous Isla Pence, MD       Current Outpatient Medications  Medication Sig Dispense Refill   Cholecalciferol (VITAMIN D3) 125 MCG (5000 UT) TABS Take 5,000 Units by mouth every morning.      clopidogrel (PLAVIX) 75 MG tablet TAKE 1 TABLET BY MOUTH EVERY DAY 90 tablet 3   losartan-hydrochlorothiazide (HYZAAR) 100-12.5 MG tablet Take 0.5 tablets by mouth daily. 30 tablet 11   Multiple Vitamins-Minerals (ONE-A-DAY WOMENS 50+ ADVANTAGE PO)  Take by mouth.     nitroGLYCERIN (NITROSTAT) 0.4 MG SL tablet Place 1 tablet (0.4 mg total) under the tongue every 5 (five) minutes as needed. 25 tablet 2   pantoprazole (PROTONIX) 40 MG tablet Take 40 mg by mouth daily.     propranolol (INDERAL) 10 MG tablet Take  5 mg to 10 mg  by mouth  as needed up to twice a day  for anxiety 10 tablet 3   rosuvastatin (CRESTOR) 40 MG tablet Take by mouth.     sertraline (ZOLOFT) 50 MG tablet Take 50 mg by mouth daily. (Patient not taking: Reported on 04/12/2021)      Allergies as of 07/15/2021 - Review Complete 07/15/2021  Allergen Reaction Noted   Ceclor [cefaclor] Hives and Rash 06/12/2010   Tetracyclines & related Hives 07/29/2013   Lipitor [atorvastatin calcium] Cough 10/07/2011   Penicillins Swelling and Other (See Comments) 06/12/2010   Amoxicillin Rash 06/12/2010   Ampicillin Rash 06/12/2010   Lisinopril Cough 04/25/2012   Simvastatin Cough 09/22/2011    Family History  Problem Relation Age of Onset   Hypertension Mother    Cancer Father        lung   Alzheimer's disease Father    Migraines Father    Hyperlipidemia Sister    Hypertension Sister    Alzheimer's disease Paternal Grandmother    Cancer Paternal Grandfather        lung    Social History   Socioeconomic History   Marital status: Married    Spouse name: Ileene Allie   Number of children: 2   Years of education: 12   Highest education level: High school graduate  Occupational History   Occupation: Retired Best boy: [employer]    Comment: Chief Executive Officer, Proofreader  Tobacco Use   Smoking status: Former    Packs/day: 0.10    Years: 30.00    Pack years: 3.00    Types: Cigarettes    Quit date: 04/26/2012    Years since quitting: 9.2   Smokeless tobacco: Never  Vaping Use   Vaping Use: Never used  Substance and Sexual Activity   Alcohol use: Yes    Comment: occasional   Drug use: No   Sexual activity: Yes    Partners: Male    Birth  control/protection:  Surgical    Comment: hysterectomy  Other Topics Concern   Not on file  Social History Narrative   ** Merged History Encounter **       Lives with husband [name]). She travels very frequently with her job and is gone most weeks out of the month. She is active and exercises when able.   Social Determinants of Health   Financial Resource Strain: Not on file  Food Insecurity: Not on file  Transportation Needs: Not on file  Physical Activity: Not on file  Stress: Not on file  Social Connections: Not on file  Intimate Partner Violence: Not on file    Review of Systems: Gen: Denies fever, sweats or chills. No weight loss.  CV: Denies chest pain, palpitations or edema. Resp: Denies cough, shortness of breath of hemoptysis.  GI: See HPI. GU : Denies urinary burning, blood in urine, increased urinary frequency or incontinence. MS: Denies joint pain, muscles aches or weakness. Derm: Denies rash, itchiness, skin lesions or unhealing ulcers. Psych: + Depression. Heme: Denies easy bruising, bleeding. Neuro:  Denies headaches, dizziness or paresthesias. Endo:  Denies any problems with DM, thyroid or adrenal function.  Physical Exam: Vital signs in last 24 hours: Temp:  [98.7 F (37.1 C)] 98.7 F (37.1 C) (12/24 0712) Pulse Rate:  [71] 71 (12/24 0712) Resp:  [18] 18 (12/24 0712) BP: (120)/(75) 120/75 (12/24 0712) SpO2:  [99 %] 99 % (12/24 0716) Weight:  [85.8 kg] 85.8 kg (12/24 0716)   General:  Alert 65 year old female in no acute distress. Head:  Normocephalic and atraumatic. Eyes:  No scleral icterus. Conjunctiva pink. Ears:  Normal auditory acuity. Nose:  No deformity, discharge or lesions. Mouth:  Dentition intact. No ulcers or lesions.  Neck:  Supple. No lymphadenopathy or thyromegaly.  Lungs: Breath sounds clear throughout. Heart: Regular rate and rhythm, no murmurs Abdomen: Soft, nondistended.  Mild epigastric and LUQ tenderness without rebound or  guarding.  Positive bowel sounds all 4 quadrants.  No obvious HSM. Rectal: Deferred. Musculoskeletal:  Symmetrical without gross deformities.  Pulses:  Normal pulses noted. Extremities:  Without clubbing or edema. Neurologic:  Alert and  oriented x4. No focal deficits.  Skin:  Intact without significant lesions or rashes. Psych:  Alert and cooperative. Normal mood and affect.  Intake/Output from previous day: No intake/output data recorded. Intake/Output this shift: No intake/output data recorded.  Lab Results: Recent Labs    07/15/21 0730  WBC 7.4  HGB 10.5*  HCT 31.4*  PLT 149*   BMET No results for input(s): NA, K, CL, CO2, GLUCOSE, BUN, CREATININE, CALCIUM in the last 72 hours. LFT No results for input(s): PROT, ALBUMIN, AST, ALT, ALKPHOS, BILITOT, BILIDIR, IBILI in the last 72 hours. PT/INR Recent Labs    07/15/21 0730  LABPROT 13.8  INR 1.1   Hepatitis Panel No results for input(s): HEPBSAG, HCVAB, HEPAIGM, HEPBIGM in the last 72 hours.    Studies/Results: DG Chest 1 View  Result Date: 07/15/2021 CLINICAL DATA:  Syncope, dizziness, shortness of breath EXAM: CHEST  1 VIEW COMPARISON:  05/29/2012 FINDINGS: The heart size and mediastinal contours are within normal limits. Both lungs are clear. The visualized skeletal structures are unremarkable. IMPRESSION: Negative. Electronically Signed   By: Rolm Baptise M.D.   On: 07/15/2021 08:04   DG Sacrum/Coccyx  Result Date: 07/15/2021 CLINICAL DATA:  Syncope, coccyx pain EXAM: SACRUM AND COCCYX - 2+ VIEW COMPARISON:  None. FINDINGS: There is no evidence of fracture or other focal bone lesions.  SI joints and hip joints symmetric. IMPRESSION: Negative. Electronically Signed   By: Rolm Baptise M.D.   On: 07/15/2021 08:04   CT HEAD WO CONTRAST  Result Date: 07/15/2021 CLINICAL DATA:  65 year old female with dizziness, altered mental status. EXAM: CT HEAD WITHOUT CONTRAST TECHNIQUE: Contiguous axial images were obtained  from the base of the skull through the vertex without intravenous contrast. COMPARISON:  Brain MRI 02/16/2014.  Head CT 02/08/2012. FINDINGS: Brain: Cerebral volume is within normal limits for age. Chronic partially empty sella. No midline shift, ventriculomegaly, mass effect, evidence of mass lesion, intracranial hemorrhage or evidence of cortically based acute infarction. Gray-white matter differentiation is within normal limits throughout the brain. Vascular: Calcified atherosclerosis at the skull base. No suspicious intracranial vascular hyperdensity. Skull: Chronic bilateral maxilla ORIF. No acute osseous abnormality identified. Sinuses/Orbits: Left maxillary sinus mucosal thickening is mild-to-moderate. Other Visualized paranasal sinuses and mastoids are stable and well aerated. Other: Visualized orbits and scalp soft tissues are within normal limits. IMPRESSION: 1. Stable and normal for age non contrast CT appearance of the brain. 2. Mild to moderate left maxillary sinus disease. Electronically Signed   By: Genevie Ann M.D.   On: 07/15/2021 07:52    IMPRESSION/PLAN:  65 year old female scented to the ED following 2 episodes of syncope at home.  Labs in the ED showed anemia with a hemoglobin level 10.5 with a baseline hemoglobin level of 13.7.  She demonstrated passing 4 melenic stools in the ED.  She denied any prior knowledge of passing black stools that she does not look at the stool prior to flushing. -NPO -Check H&H every 6 hours x24 hours -Continue PPI infusion -Hold Plavix -IV fluids pain management per the hospitalist -Ondansetron 4 mg p.o. or IV every 6 hours as needed -EGD to rule out anastomotic ulcer/PUD/UGI malignancy after Plavix washout  History of tubulovillous adenomatous polyp in 2011.  Follow-up colonoscopy 1 year was recommended but was not done.  Maternal grandmother with history of colon cancer. -Eventual colonoscopy at time of EGD during this hospitalization or as an  outpatient, await further recommendations per Dr. Rush Landmark  History of coronary artery disease s/p DES x 2 on Plavix.  Capitation's controlled on propanolol.  Twelve-lead EKG without acute ischemia. -Continue to hold Plavix    Patrecia Pour [facility]  07/15/2021, 10:53 AM

## 2021-07-16 DIAGNOSIS — K284 Chronic or unspecified gastrojejunal ulcer with hemorrhage: Secondary | ICD-10-CM | POA: Diagnosis not present

## 2021-07-16 DIAGNOSIS — R4020 Unspecified coma: Secondary | ICD-10-CM | POA: Diagnosis not present

## 2021-07-16 DIAGNOSIS — R195 Other fecal abnormalities: Secondary | ICD-10-CM | POA: Diagnosis not present

## 2021-07-16 DIAGNOSIS — D62 Acute posthemorrhagic anemia: Secondary | ICD-10-CM | POA: Diagnosis not present

## 2021-07-16 DIAGNOSIS — K922 Gastrointestinal hemorrhage, unspecified: Secondary | ICD-10-CM | POA: Diagnosis not present

## 2021-07-16 DIAGNOSIS — K289 Gastrojejunal ulcer, unspecified as acute or chronic, without hemorrhage or perforation: Secondary | ICD-10-CM | POA: Diagnosis not present

## 2021-07-16 LAB — COMPREHENSIVE METABOLIC PANEL
ALT: 16 U/L (ref 0–44)
AST: 14 U/L — ABNORMAL LOW (ref 15–41)
Albumin: 3.5 g/dL (ref 3.5–5.0)
Alkaline Phosphatase: 43 U/L (ref 38–126)
Anion gap: 6 (ref 5–15)
BUN: 16 mg/dL (ref 8–23)
CO2: 23 mmol/L (ref 22–32)
Calcium: 8.8 mg/dL — ABNORMAL LOW (ref 8.9–10.3)
Chloride: 111 mmol/L (ref 98–111)
Creatinine, Ser: 0.87 mg/dL (ref 0.44–1.00)
GFR, Estimated: >60 mL/min (ref >60)
Glucose, Bld: 105 mg/dL — ABNORMAL HIGH (ref 70–99)
Potassium: 3.4 mmol/L — ABNORMAL LOW (ref 3.5–5.1)
Sodium: 140 mmol/L (ref 135–145)
Total Bilirubin: 0.8 mg/dL (ref 0.3–1.2)
Total Protein: 5.9 g/dL — ABNORMAL LOW (ref 6.5–8.1)

## 2021-07-16 LAB — CBC
HCT: 30.9 % — ABNORMAL LOW (ref 36.0–46.0)
HCT: 26.7 % — ABNORMAL LOW (ref 36.0–46.0)
Hemoglobin: 10.3 g/dL — ABNORMAL LOW (ref 12.0–15.0)
Hemoglobin: 9 g/dL — ABNORMAL LOW (ref 12.0–15.0)
MCH: 30 pg (ref 26.0–34.0)
MCH: 30.3 pg (ref 26.0–34.0)
MCHC: 33.3 g/dL (ref 30.0–36.0)
MCHC: 33.7 g/dL (ref 30.0–36.0)
MCV: 90.1 fL (ref 80.0–100.0)
MCV: 89.9 fL (ref 80.0–100.0)
Platelets: 130 10*3/uL — ABNORMAL LOW (ref 150–400)
Platelets: 117 10*3/uL — ABNORMAL LOW (ref 150–400)
RBC: 3.43 MIL/uL — ABNORMAL LOW (ref 3.87–5.11)
RBC: 2.97 MIL/uL — ABNORMAL LOW (ref 3.87–5.11)
RDW: 13.8 % (ref 11.5–15.5)
RDW: 13.8 % (ref 11.5–15.5)
WBC: 5 10*3/uL (ref 4.0–10.5)
WBC: 3.4 10*3/uL — ABNORMAL LOW (ref 4.0–10.5)
nRBC: 0 % (ref 0.0–0.2)
nRBC: 0 % (ref 0.0–0.2)

## 2021-07-16 LAB — PROTIME-INR
INR: 1 (ref 0.8–1.2)
Prothrombin Time: 13.5 seconds (ref 11.4–15.2)

## 2021-07-16 LAB — ABO/RH: ABO/RH(D): A POS

## 2021-07-16 LAB — GLUCOSE, CAPILLARY: Glucose-Capillary: 102 mg/dL — ABNORMAL HIGH (ref 70–99)

## 2021-07-16 LAB — HEMOGLOBIN AND HEMATOCRIT, BLOOD
HCT: 26.6 % — ABNORMAL LOW (ref 36.0–46.0)
Hemoglobin: 8.9 g/dL — ABNORMAL LOW (ref 12.0–15.0)

## 2021-07-16 MED ORDER — SUCRALFATE 1 GM/10ML PO SUSP
1.0000 g | Freq: Three times a day (TID) | ORAL | 0 refills | Status: DC
  Filled 2021-07-16: qty 1200, 30d supply, fill #0

## 2021-07-16 MED ORDER — HEPARIN SODIUM (PORCINE) 1000 UNIT/ML IJ SOLN
INTRAMUSCULAR | Status: AC
  Filled 2021-07-16: qty 10

## 2021-07-16 MED ORDER — SUCRALFATE 1 GM/10ML PO SUSP: 1.0000 g | Freq: Three times a day (TID) | ORAL | 0 refills | Status: DC

## 2021-07-16 MED ORDER — PANTOPRAZOLE SODIUM 40 MG PO TBEC: 40.0000 mg | DELAYED_RELEASE_TABLET | Freq: Two times a day (BID) | ORAL | Status: DC

## 2021-07-16 MED ORDER — FERROUS SULFATE 325 (65 FE) MG PO TABS
325.0000 mg | ORAL_TABLET | Freq: Every day | ORAL | 0 refills | Status: DC
Start: 2021-07-16 — End: 2021-07-16
  Filled 2021-07-16: qty 30, 30d supply, fill #0

## 2021-07-16 MED ORDER — PANTOPRAZOLE SODIUM 40 MG IV SOLR
40.0000 mg | Freq: Two times a day (BID) | INTRAVENOUS | Status: DC
  Administered 2021-07-16: 08:00:00 40 mg via INTRAVENOUS
  Filled 2021-07-16: qty 40

## 2021-07-16 MED ORDER — PANTOPRAZOLE SODIUM 40 MG PO TBEC: 40.0000 mg | DELAYED_RELEASE_TABLET | Freq: Two times a day (BID) | ORAL | 0 refills | Status: DC

## 2021-07-16 MED ORDER — POTASSIUM CHLORIDE CRYS ER 20 MEQ PO TBCR
40.0000 meq | EXTENDED_RELEASE_TABLET | Freq: Two times a day (BID) | ORAL | Status: DC
  Administered 2021-07-16: 08:00:00 40 meq via ORAL
  Filled 2021-07-16: qty 2

## 2021-07-16 MED ORDER — FERROUS SULFATE 325 (65 FE) MG PO TABS: 325.0000 mg | ORAL_TABLET | Freq: Every day | ORAL | 0 refills | Status: DC

## 2021-07-16 MED ORDER — PANTOPRAZOLE SODIUM 40 MG PO TBEC
40.0000 mg | DELAYED_RELEASE_TABLET | Freq: Two times a day (BID) | ORAL | 0 refills | Status: DC
  Filled 2021-07-16: qty 60, 30d supply, fill #0

## 2021-07-16 MED ORDER — FERROUS SULFATE 325 (65 FE) MG PO TABS
325.0000 mg | ORAL_TABLET | ORAL | Status: DC
  Administered 2021-07-16: 12:00:00 325 mg via ORAL
  Filled 2021-07-16: qty 1

## 2021-07-16 NOTE — Plan of Care (Signed)

## 2021-07-16 NOTE — Progress Notes (Signed)
Gastroenterology Inpatient Follow-up Note   PATIENT IDENTIFICATION  Carrie Reynolds is a 65 y.o. female who presented with anemia, syncope, abdominal pain, melena and found to have jejunal ulcers. Hospital Day: 2  SUBJECTIVE  The patient is evaluated this morning. The primary medicine service is also evaluating the patient. Overall she is feeling better. She had 1 dark bowel movement overnight into this morning. Hemogram was noted and is stable. She denies any fevers or chills. She is anxious to get home soon if possible.   OBJECTIVE  Scheduled Inpatient Medications:   heparin sodium (porcine)       pantoprazole (PROTONIX) IV  40 mg Intravenous BID   potassium chloride  40 mEq Oral BID   rosuvastatin  40 mg Oral Daily   sertraline  50 mg Oral Daily   sodium chloride flush  3 mL Intravenous Q12H   sucralfate  1 g Oral TID WC & HS   Continuous Inpatient Infusions:   sodium chloride 125 mL/hr at 07/16/21 0332   PRN Inpatient Medications:    Physical Examination  Temp:  [97.5 F (36.4 C)-98.9 F (37.2 C)] 97.8 F (36.6 C) (12/25 0754) Pulse Rate:  [58-78] 66 (12/25 0800) Resp:  [12-19] 18 (12/25 0754) BP: (110-144)/(62-76) 133/76 (12/25 0754) SpO2:  [95 %-100 %] 97 % (12/25 0800) Weight:  [91.7 kg-94.3 kg] 91.7 kg (12/25 0519) Temp (24hrs), Avg:98.1 F (36.7 C), Min:97.5 F (36.4 C), Max:98.9 F (37.2 C)  Weight: 91.7 kg GEN: NAD, appears stated age, doesn't appear chronically ill, husband on the phone PSYCH: Cooperative, without pressured speech EYE: Conjunctivae pink ENT: MMM CV: Nontachycardic RESP: No audible wheezing GI: No rebound SKIN: No jaundice NEURO:  Alert & Oriented x 3, no focal deficits   Review of Data   Laboratory Studies   Recent Labs  Lab 07/16/21 0552  NA 140  K 3.4*  CL 111  CO2 23  BUN 16  CREATININE 0.87  GLUCOSE 105*  CALCIUM 8.8*   Recent Labs  Lab 07/16/21 0552  AST 14*  ALT 16  ALKPHOS 43    Recent Labs   Lab 07/15/21 0730 07/15/21 1624 07/16/21 0552  WBC 7.4 5.7 5.0  HGB 10.5* 10.0* 10.3*  HCT 31.4* 28.9* 30.9*  PLT 149* 129* 130*   Recent Labs  Lab 07/15/21 0730 07/16/21 0552  INR 1.1 1.0    Imaging Studies  DG Chest 1 View  Result Date: 07/15/2021 CLINICAL DATA:  Syncope, dizziness, shortness of breath EXAM: CHEST  1 VIEW COMPARISON:  05/29/2012 FINDINGS: The heart size and mediastinal contours are within normal limits. Both lungs are clear. The visualized skeletal structures are unremarkable. IMPRESSION: Negative. Electronically Signed   By: Rolm Baptise M.D.   On: 07/15/2021 08:04   DG Sacrum/Coccyx  Result Date: 07/15/2021 CLINICAL DATA:  Syncope, coccyx pain EXAM: SACRUM AND COCCYX - 2+ VIEW COMPARISON:  None. FINDINGS: There is no evidence of fracture or other focal bone lesions. SI joints and hip joints symmetric. IMPRESSION: Negative. Electronically Signed   By: Rolm Baptise M.D.   On: 07/15/2021 08:04   CT HEAD WO CONTRAST  Result Date: 07/15/2021 CLINICAL DATA:  65 year old female with dizziness, altered mental status. EXAM: CT HEAD WITHOUT CONTRAST TECHNIQUE: Contiguous axial images were obtained from the base of the skull through the vertex without intravenous contrast. COMPARISON:  Brain MRI 02/16/2014.  Head CT 02/08/2012. FINDINGS: Brain: Cerebral volume is within normal limits for age. Chronic partially empty sella. No midline shift,  ventriculomegaly, mass effect, evidence of mass lesion, intracranial hemorrhage or evidence of cortically based acute infarction. Gray-white matter differentiation is within normal limits throughout the brain. Vascular: Calcified atherosclerosis at the skull base. No suspicious intracranial vascular hyperdensity. Skull: Chronic bilateral maxilla ORIF. No acute osseous abnormality identified. Sinuses/Orbits: Left maxillary sinus mucosal thickening is mild-to-moderate. Other Visualized paranasal sinuses and mastoids are stable and well  aerated. Other: Visualized orbits and scalp soft tissues are within normal limits. IMPRESSION: 1. Stable and normal for age non contrast CT appearance of the brain. 2. Mild to moderate left maxillary sinus disease. Electronically Signed   By: Genevie Ann M.D.   On: 07/15/2021 07:52    GI Procedures and Studies  Enteroscopy - No gross lesions in esophagus proximally. Salmon-colored mucosa suspicious for Barrett's esophagus distally - biopsied. - Z-line irregular, 35 cm from the incisors. - Roux-en-Y gastrojejunostomy with gastrojejunal anastomosis characterized by slight congestion, erosion, inflammation. Gastric pouch with a staple that was removed. - 2 non-bleeding jejunal ulcers with a clean ulcer base (Forrest Class III) were found just distal to the anastomosis within the jejunum (largest was 24 mm in size). - Normal mucosa was found in the visualized jejunal limbs.   ASSESSMENT  Carrie Reynolds is a 65 y.o. female with a pmh significant for gastric bypass, CAD (on Plavix), nephrolithiasis, hypertension, OSA.  Patient found to have ulcer disease as etiology for symptoms of anemia and syncope.  Overall, the patient is doing well.  She is hemodynamically and clinically stable.  We expect dark stools for the next 1 to 2 days.  Hemoglobin has been stable from last night into this morning.  I think she can be transitioned from IV PPI to twice daily oral PPI.  If able to hold on Plavix restart for a few days that would be ideal.  I will set up a follow-up in clinic as well as follow-up her pathology.  In 2023 we can do her surveillance EGD with her colonoscopy at the same time.  I see no contraindication to being discharged home later today unless other changes develop.   PLAN/RECOMMENDATIONS  Transition patient to p.o. PPI twice daily 40 mg Continue Carafate before every meal plus nightly x1 month Pathology will be followed up and patient will be updated Recommend CBC be drawn by PCP or my office in 1  week (primary team will let me know what she decides) Oral iron once daily to be initiated If able to hold Plavix for another 48 hours that would be ideal to try to minimize risk of rebleeding  May advance diet as tolerated   GI will sign off at this time but please page/call with questions or concerns.   Justice Britain, MD [facility] Office # [phone]    LOS: 1 day  [facility]  07/16/2021, 9:49 AM

## 2021-07-16 NOTE — Discharge Summary (Signed)
Name: Carrie Reynolds MRN: [id number] DOB: 1955/09/06 65 y.o. PCP: Leonard Downing, MD  Date of Admission: 07/15/2021  7:10 AM Date of Discharge: 07/16/2021 Attending Physician: Angelica Pou, MD  Discharge Diagnosis: 1. Upper GI Bleed 2. Acute Blood Loss Anemia 3. Jejunal Ulcers   Discharge Medications: Allergies as of 07/16/2021       Reactions   Ceclor [cefaclor] Hives, Rash   Tetracyclines & Related Hives   Lipitor [atorvastatin Calcium] Cough   Penicillins Swelling, Other (See Comments)   Has patient had a PCN reaction causing immediate rash, facial/tongue/throat swelling, SOB or lightheadedness with hypotension: No Has patient had a PCN reaction causing severe rash involving mucus membranes or skin necrosis: No Has patient had a PCN reaction that required hospitalization: Yes Has patient had a PCN reaction occurring within the last 10 years: No If all of the above answers are "NO", then may proceed with Cephalosporin use.   Amoxicillin Rash   Ampicillin Rash   Lisinopril Cough   Simvastatin Cough        Medication List     TAKE these medications    clopidogrel 75 MG tablet Commonly known as: PLAVIX TAKE 1 TABLET BY MOUTH EVERY DAY   ferrous sulfate 325 (65 FE) MG tablet Take 1 tablet (325 mg total) by mouth daily with breakfast.   losartan-hydrochlorothiazide 100-12.5 MG tablet Commonly known as: HYZAAR Take 0.5 tablets by mouth daily.   nitroGLYCERIN 0.4 MG SL tablet Commonly known as: Nitrostat Place 1 tablet (0.4 mg total) under the tongue every 5 (five) minutes as needed.   pantoprazole 40 MG tablet Commonly known as: PROTONIX Take 1 tablet (40 mg total) by mouth 2 (two) times daily. What changed: when to take this   rosuvastatin 40 MG tablet Commonly known as: CRESTOR Take 40 mg by mouth daily.   sertraline 50 MG tablet Commonly known as: ZOLOFT Take 50 mg by mouth in the morning and at bedtime.   sucralfate 1 GM/10ML  suspension Commonly known as: CARAFATE Take 10 mLs (1 g total) by mouth 4 (four) times daily -  with meals and at bedtime.        Disposition and follow-up:   Carrie Reynolds was discharged from [facility] in Stable condition.  At the hospital follow up visit please address:  Upper GI Bleed 2/2 Jejunal Ulcers: Patient will dc on protonix 40mg  BID, carafate (1 month duration), and iron supplement. She will need to follow up with  GI for follow up EGD in 4 weeks. She will need a repeat CBC in one week. She will try and make an appointment for hospital follow up and CBC with her PCP in 1 week. Will need to follow up on surgical path result and H. Pylori testing. Concern for Barrett's Esophagus: Please follow up on surgical pathology result for distal esophagus biopsy. Hx of tubulovillous adenomatous polyp in 2011: Will f/u with GI for possible EGD and colonoscopy.   Labs / imaging needed at time of follow-up: CBC  Pending labs/ test needing follow-up: Surgical pathology of distal esophagus, surgical pathology of gastric mucosa.  Follow-up Appointments:  Follow-up Information     [facility] Follow up.   Specialty: Gastroenterology Why: Their office will call you to set up a follow up appointment. If you have not heard from them in the next week please call the office. Contact information: [address] [place] [address] [phone]  Leonard Downing, MD Follow up.   Specialty: Family Medicine Why: Please call to make a hospital follow up appointment within one week and for labwork to check blood cell levels. Contact information: [place] [address] [phone]                 Hospital Course by Problem List:  Carrie Reynolds is a 65 y.o. Caucasian female with a PMHx of HTN, HLD, CAD s/p DES x 2 2019/2020 on Plavix, OSA, chronic sinus bradycardia, palpitations,  atypical angina, GERD, S/P gastric bypass surgery at [facility] in 2017, s/p cholecystectomy, Depression, who presents for syncopal episodes.    #Upper GI Bleed 2/2 Jejunal Ulcers #Acute Blood Loss Anemia #GERD #Concern for Barrett's Esophagus Patient presented with two syncopal episodes, had x4 loose melenic stools in the ED, with hemoglobin 10.5, down from 12.9 in 02/2020. Acute blood loss anemia likely secondary to upper GI bleed given melena. GI consulted for EGD. Differential also included cardiac cause of syncope though EKG with NSR, troponins flat, pt denying new or worsening chest pain and palpitations. Patient was started on Protonix infusion.  S/p 1L NS, started on maintenance fluids.  GI to perform EGD to rule out anastomotic ulcer/PUD/UGI malignancy. EGD showed: salmon mucosa of distal esophagus with concern for developing barrett's esophagus with irregular z line, GJ anastomosis with congestion, erosion, inflammation, and 2 non bleeding cratered jejunal ulcers just distal to anastomosis with clean ulcer bases. 2 biopsies performed: surgical path of gastric mucosa for H. Pylori testing, and surgical path of distal esophagus given concern for dysplasia of the mucosa. Patient was transitioned to Protonix IV, carafate and CLD. Repeat Hgb stable ~10 after EGD. Carafate will need to be continued for 1 month. Plavix will be held for three days following EGD, can restart on 12/28. Patient tolerated advancement in diet and is stable for discharge today. Patient will need to follow up with [facility] for repeat EGD in 4 months.    #Hx of tubulovillous adenomatous polyp in 2011 #Fhx of colon cancer 2011 colonoscopy by Dr. Boykin Reaper identified for hyperplastic polyps and one tubulovillous adenomatous polyp which were resected from the sigmoid colon.  Patient was advised to repeat colonoscopy in 1 year which was not done. Seems there was miscommunication and patient thought her colonoscopy was completely clean and  she didn't need one for the next 10 years. Patient will follow up with GI for both EGD and colonoscopy.   #CAD s/p LAD DES x 2 2019/2020 #HTN #HLD #Hx chronic sinus bradycardia, with PACs #Hx Rapid Palpitations Patient follows with Dr. Ellyn Hack, cardiologist.  Last OV 03/2021.  Last Myoview 10/2019 no ischemia, low risk.  New medications: Clopidogrel 75 mg daily, losartan-hydrochlorothiazide 100-12.5 mg daily, rosuvastatin 40 mg daily.  Patient reports not currently taking propranolol for palpitations.  Cardiology believes palpitations primarily anxiety driven, had ordered propranolol as as needed. Plavix was held for EGD, Crestor was continued. Losartan-HCTZ was held given soft Bps. Will restart BP med at discharge given improvement in BP. Will hold Plavix until 12/28.  #Depression with anxiety Home medication Zoloft 50 mg daily was continued this admission.  Patient also has prescription for propranolol as needed for anxiety and palpitations, however patient not currently taking this medication and afraid to take this medication, will take off medication list.  Subjective on day of discharge: Patient reports feeling well this morning. Did have one additional melenic stool this morning, none overnight. Hgb remained stable. Patient  tolerating CLD, eager to advance and dc today. Discussed will repeat Hgb this afternoon with plan to dc later today if it remained stable. Discussed EGD results, plan for follow up, plan for changes in home medications. GI MD also present in the room for these discussions.   Discharge Exam:   BP 132/80 (BP Location: Left Arm)    Pulse 62    Temp 97.8 F (36.6 C) (Oral)    Resp 18    Ht 5\' 7"  (1.702 m)    Wt 91.7 kg    LMP 07/24/2003 (Within Months)    SpO2 97%    BMI 31.65 kg/m  Discharge exam: General: Well appearing overweight Caucasian female, NAD HENT: normocephalic, atraumatic EYES: conjunctiva non-erythematous, no scleral icterus CV: regular rate, normal rhythm,  no murmurs, rubs, gallops.  Trace lower extremity edema. Pulmonary: normal work of breathing on RA, lungs clear to auscultation, no rales, wheezes, rhonchi Abdominal: non-distended, soft, no tenderness to palpation, normal BS Skin: Warm and dry, no rashes or lesions on exposed skin surfaces Neurological: MS: awake, alert and oriented x3, normal speech and fund of knowledge Motor: moves all extremities antigravity Psych: normal affect   Pertinent Labs, Studies, and Procedures:  CBC Latest Ref Rng & Units 07/16/2021 07/16/2021 07/16/2021  WBC 4.0 - 10.5 K/uL - 3.4(L) 5.0  Hemoglobin 12.0 - 15.0 g/dL 8.9(L) 9.0(L) 10.3(L)  Hematocrit 36.0 - 46.0 % 26.6(L) 26.7(L) 30.9(L)  Platelets 150 - 400 K/uL - 117(L) 130(L)   CMP Latest Ref Rng & Units 07/16/2021 07/15/2021 06/24/2020  Glucose 70 - 99 mg/dL 105(H) 126(H) 79  BUN 8 - 23 mg/dL 16 39(H) 18  Creatinine 0.44 - 1.00 mg/dL 0.87 0.89 0.83  Sodium 135 - 145 mmol/L 140 138 141  Potassium 3.5 - 5.1 mmol/L 3.4(L) 3.5 4.4  Chloride 98 - 111 mmol/L 111 107 104  CO2 22 - 32 mmol/L 23 21(L) 25  Calcium 8.9 - 10.3 mg/dL 8.8(L) 9.2 9.0  Total Protein 6.5 - 8.1 g/dL 5.9(L) 5.9(L) 7.0  Total Bilirubin 0.3 - 1.2 mg/dL 0.8 0.9 0.5  Alkaline Phos 38 - 126 U/L 43 46 56  AST 15 - 41 U/L 14(L) 16 14  ALT 0 - 44 U/L 16 18 13     DG Chest 1 View  Result Date: 07/15/2021 CLINICAL DATA:  Syncope, dizziness, shortness of breath EXAM: CHEST  1 VIEW COMPARISON:  05/29/2012 FINDINGS: The heart size and mediastinal contours are within normal limits. Both lungs are clear. The visualized skeletal structures are unremarkable. IMPRESSION: Negative. Electronically Signed   By: Rolm Baptise M.D.   On: 07/15/2021 08:04   DG Sacrum/Coccyx  Result Date: 07/15/2021 CLINICAL DATA:  Syncope, coccyx pain EXAM: SACRUM AND COCCYX - 2+ VIEW COMPARISON:  None. FINDINGS: There is no evidence of fracture or other focal bone lesions. SI joints and hip joints symmetric.  IMPRESSION: Negative. Electronically Signed   By: Rolm Baptise M.D.   On: 07/15/2021 08:04   CT HEAD WO CONTRAST  Result Date: 07/15/2021 CLINICAL DATA:  65 year old female with dizziness, altered mental status. EXAM: CT HEAD WITHOUT CONTRAST TECHNIQUE: Contiguous axial images were obtained from the base of the skull through the vertex without intravenous contrast. COMPARISON:  Brain MRI 02/16/2014.  Head CT 02/08/2012. FINDINGS: Brain: Cerebral volume is within normal limits for age. Chronic partially empty sella. No midline shift, ventriculomegaly, mass effect, evidence of mass lesion, intracranial hemorrhage or evidence of cortically based acute infarction. Gray-white matter differentiation is within normal limits  throughout the brain. Vascular: Calcified atherosclerosis at the skull base. No suspicious intracranial vascular hyperdensity. Skull: Chronic bilateral maxilla ORIF. No acute osseous abnormality identified. Sinuses/Orbits: Left maxillary sinus mucosal thickening is mild-to-moderate. Other Visualized paranasal sinuses and mastoids are stable and well aerated. Other: Visualized orbits and scalp soft tissues are within normal limits. IMPRESSION: 1. Stable and normal for age non contrast CT appearance of the brain. 2. Mild to moderate left maxillary sinus disease. Electronically Signed   By: Genevie Ann M.D.   On: 07/15/2021 07:52   EGD 07/15/2021 Findings: No gross lesions were noted in the proximal esophagus and in the mid esophagus. One tongue of salmon-colored mucosa was present from 34 to 35 cm in the distal esophagus. No other visible abnormalities were present. Biopsies were taken with a cold forceps for histology to rule in/out Barrett's. The Z-line was irregular and was found 35 cm from the incisors. Evidence of a Roux-en-Y gastrojejunostomy was found. The gastrojejunal anastomosis was characterized by congestion, erosion, inflammation. The [place] anastomosis was traversed. The pouch-to-jejunum  limb was characterized by healthy appearing mucosa and an intact staple which was removed with a forceps. Biopsies were taken with a cold forceps for histology and Helicobacter pylori testing of the gastric pouch. Two non-bleeding cratered jejunal ulcers just distal to the anastomosis with clean ulcer bases (Forrest Class III) were found in the jejunum. The largest lesion was 24 mm in largest dimension and the second lesion was approximately 9 mm in size. Normal mucosa was found in the afferent/efferent jejunum that was examined. IMPRESSION: - No gross lesions in esophagus proximally. Salmon-colored mucosa suspicious for Barrett's esophagus distally - biopsied. - Z-line irregular, 35 cm from the incisors. - Roux-en-Y gastrojejunostomy with gastrojejunal anastomosis characterized by slight congestion, erosion, inflammation. Gastric pouch with a staple that was removed. - 2 non-bleeding jejunal ulcers with a clean ulcer base (Forrest Class III) were found just distal to the anastomosis within the jejunum (largest was 24 mm in size). - Normal mucosa was found in the visualized jejunal limbs.  Discharge Instructions: Discharge Instructions     Call MD for:  extreme fatigue   Complete by: As directed    Call MD for:  persistant dizziness or light-headedness   Complete by: As directed    Discharge instructions   Complete by: As directed    You were hospitalized for upper gastrointestinal bleeding from 2 ulcers in the jejunum (early part of the small intestine), medication Protonix as well as Carafate can help the ulcers heal.  We increased your Protonix dose to 40 mg twice daily.  We will asked that you hold your Plavix until 12/28 when you can restart your Plavix.  Please eat and drink as tolerated. Thank you for allowing [place] to be part of your care.    Please follow-up with [facility], their number is provided in discharge paperwork Please follow-up with your primary care  physician within 1 week to obtain lab work, to check your hemoglobin (red blood cells).  If you are unable to make an appointment with your PCP please call the gastroenterology office and they will set up an appointment for lab work for you within 1 week.   Please note these changes made to your medications:    Please START taking:  Protonix 40 mg twice daily Carafate 10 mL suspension 3 times with meals and once at bedtime Iron supplement take 1 tablet daily   Please hold your Plavix until 12/28 when you can resume  Portions of this report may have been transcribed using voice recognition software. Every effort was made to ensure accuracy; however, inadvertent computerized transcription errors may be present.   Wayland Denis, MD 07/16/21,  4:27 PM Pager: [phone] Internal Medicine Resident, PGY-1 [facility]

## 2021-07-16 NOTE — Discharge Instructions (Addendum)
You were hospitalized for upper gastrointestinal bleeding from 2 ulcers in the jejunum (early part of the small intestine), medication Protonix as well as Carafate can help the ulcers heal.  We increased your Protonix dose to 40 mg twice daily.  We will asked that you hold your Plavix until 12/28 when you can restart your Plavix.  Please eat and drink as tolerated. Thank you for allowing [place] to be part of your care.   Please follow-up with [facility], their number is provided in discharge paperwork Please follow-up with your primary care physician within 1 week to obtain lab work, to check your hemoglobin (red blood cells).  If you are unable to make an appointment with your PCP please call the gastroenterology office and they will set up an appointment for lab work for you within 1 week.  Please note these changes made to your medications:   Please START taking:  Protonix 40 mg twice daily Carafate 10 mL suspension 3 times with meals and once at bedtime Iron supplement take 1 tablet daily  Please hold your Plavix until 12/28 when you can resume  If you continue to have black bowel movements, feel fatigued and lightheaded, please come back to the ED for re-evaluation

## 2021-07-16 NOTE — Progress Notes (Signed)
Pt would like MD or provider to call husband about results of EGD, stated that he was not called after procedure.   Rowland Lathe, RN 12.25.22 [phone]

## 2021-07-16 NOTE — Progress Notes (Signed)
Subjective:  Overnight Events: No overnight events.  Patient was seen and examined on rounds. Patient reports feeling well this morning. Did have one additional melenic stool this morning, none overnight. Hgb remained stable. Patient tolerating CLD, eager to advance and dc today. Discussed will repeat Hgb this afternoon with plan to dc later today if it remained stable. Discussed EGD results, plan for follow up, plan for changes in home medications. GI MD also present in the room for these discussions.   Objective:  Vital signs in last 24 hours: Vitals:   07/16/21 0754 07/16/21 0800 07/16/21 1159 07/16/21 1448  BP: 133/76  128/68 132/80  Pulse:  66  62  Resp: 18  18 18   Temp: 97.8 F (36.6 C)  (!) 97.5 F (36.4 C) 97.8 F (36.6 C)  TempSrc: Oral  Oral Oral  SpO2:  97%  97%  Weight:      Height:         Intake/Output Summary (Last 24 hours) at 07/16/2021 1515 Last data filed at 07/15/2021 1646 Gross per 24 hour  Intake 715 ml  Output --  Net 715 ml    Physical Exam: General: Well appearing overweight Caucasian female, NAD HENT: normocephalic, atraumatic EYES: conjunctiva non-erythematous, no scleral icterus CV: regular rate, normal rhythm, no murmurs, rubs, gallops.  Trace lower extremity edema. Pulmonary: normal work of breathing on RA, lungs clear to auscultation, no rales, wheezes, rhonchi Abdominal: non-distended, soft, no tenderness to palpation, normal BS Skin: Warm and dry, no rashes or lesions on exposed skin surfaces Neurological: MS: awake, alert and oriented x3, normal speech and fund of knowledge Motor: moves all extremities antigravity Psych: normal affect   Assessment/Plan:  Principal Problem:   Syncope  Ms. Carrie Reynolds is a 65 y.o. Caucasian female with a PMHx of HTN, HLD, CAD s/p DES x 2 2019/2020 on Plavix, OSA, chronic sinus bradycardia, palpitations, atypical angina, GERD, S/P gastric bypass surgery at [facility] in 2017, s/p cholecystectomy,  Depression, who presents for syncopal episodes.    #Upper GI Bleed 2/2 Jejunal Ulcers #Acute Blood Loss Anemia #GERD #Concern for Barrett's Esophagus Patient presented with two syncopal episodes, had x4 loose melenic stools in the ED, with hemoglobin 10.5, down from 12.9 in 02/2020. Acute blood loss anemia likely secondary to upper GI bleed given melena. GI consulted for EGD. Differential also included cardiac cause of syncope though EKG with NSR, troponins flat, pt denying new or worsening chest pain and palpitations. Patient was started on Protonix infusion.  S/p 1L NS, started on maintenance fluids.  GI to perform EGD to rule out anastomotic ulcer/PUD/UGI malignancy. EGD showed: salmon mucosa of distal esophagus with concern for developing barrett's esophagus with irregular z line, GJ anastomosis with congestion, erosion, inflammation, and 2 non bleeding cratered jejunal ulcers just distal to anastomosis with clean ulcer bases. 2 biopsies performed: surgical path of gastric mucosa for H. Pylori testing, and surgical path of distal esophagus given concern for dysplasia of the mucosa. Patient was transitioned to Protonix IV, carafate and CLD. Repeat Hgb pending. Per GI, at discharge, Carafate will need to be continued for 1 month. Plavix will be held for three days following EGD, can restart on 12/28. Patient will need to follow up with [facility] for repeat EGD in 4 months.    #Hx of tubulovillous adenomatous polyp in 2011 #Fhx of colon cancer 2011 colonoscopy by Dr. Boykin Reaper identified for hyperplastic polyps and one tubulovillous adenomatous polyp which were resected from the sigmoid colon.  Patient was advised to repeat colonoscopy in 1 year which was not done. Seems there was miscommunication and patient thought her colonoscopy was completely clean and she didn't need one for the next 10 years. Patient will follow up with GI for both EGD and colonoscopy.   #CAD s/p LAD DES x 2  2019/2020 #HTN #HLD #Hx chronic sinus bradycardia, with PACs #Hx Rapid Palpitations Patient follows with Dr. Ellyn Hack, cardiologist.  Last OV 03/2021.  Last Myoview 10/2019 no ischemia, low risk.  New medications: Clopidogrel 75 mg daily, losartan-hydrochlorothiazide 100-12.5 mg daily, rosuvastatin 40 mg daily.  Patient reports not currently taking propranolol for palpitations.  Cardiology believes palpitations primarily anxiety driven, had ordered propranolol as as needed. Plavix was held for EGD, Crestor was continued. Losartan-HCTZ was held given soft Bps. Will restart BP med at discharge given improvement in BP. Will hold Plavix until 12/28.   #Depression with anxiety Home medication Zoloft 50 mg daily was continued this admission.  Patient also has prescription for propranolol as needed for anxiety and palpitations, however patient not currently taking this medication and afraid to take this medication, will take off medication list.  Diet: Regular  VTE: None in the setting of acute GI bleed IVF: None Code: Full  Prior to Admission Living Arrangement: Home Anticipated Discharge Location: Home Barriers to Discharge: Pending repeat Hgb Dispo: Anticipated discharge in approximately 1 day(s).   Portions of this report may have been transcribed using voice recognition software. Every effort was made to ensure accuracy; however, inadvertent computerized transcription errors may be present.   Wayland Denis, MD 07/16/21,  3:15 PM Pager: [phone] Internal Medicine Resident, PGY-1 [facility]

## 2021-07-16 NOTE — Progress Notes (Signed)
OT Cancellation Note  Patient Details Name: Carrie Reynolds MRN: [id number] DOB: 24-Dec-1955   Cancelled Treatment:    Reason Eval/Treat Not Completed: OT screened, no needs identified, will sign off. Per PT and RN, pt at baseline, independent. She has no OT needs and acute OT will sign off. Please Re-Consult if needs change.   [name], OTR/L Acute Rehabilitation  [name] 07/16/2021, 10:37 AM

## 2021-07-16 NOTE — Evaluation (Signed)
Physical Therapy Evaluation & Discharge Patient Details Name: Carrie Reynolds MRN: [id number] DOB: 06-06-1956 Today's Date: 07/16/2021  History of Present Illness  Pt is a 65 y.o. female admitted 07/15/21 with syncopal episodes; pt with loose dark stools in ED. Workup for anemia likely secondary to upper GIB. S/p EGD 12/24. PMH includes HTN, HLD, CAD, chronic sinus bradycardia, angina, gastric bypass sx (2017), depression.   Clinical Impression  Patient evaluated by Physical Therapy with no further acute PT needs identified. PTA, pt independent, live with family. Today, pt independent with mobility and ADL tasks; denies dizziness with activity. All education has been completed and the patient has no further questions. Acute PT is signing off. Thank you for this referral.     Recommendations for follow up therapy are one component of a multi-disciplinary discharge planning process, led by the attending physician.  Recommendations may be updated based on patient status, additional functional criteria and insurance authorization.  Follow Up Recommendations No PT follow up    Assistance Recommended at Discharge None  Functional Status Assessment Patient has not had a recent decline in their functional status  Equipment Recommendations  None recommended by PT    Recommendations for Other Services       Precautions / Restrictions Precautions Precautions: None Restrictions Weight Bearing Restrictions: No      Mobility  Bed Mobility Overal bed mobility: Independent                  Transfers Overall transfer level: Independent                      Ambulation/Gait Ambulation/Gait assistance: Independent Gait Distance (Feet): 540 Feet Assistive device: None Gait Pattern/deviations: WFL(Within Functional Limits)          Stairs Stairs:  (pt declined)          Wheelchair Mobility    Modified Rankin (Stroke Patients Only)       Balance Overall  balance assessment: Independent                                           Pertinent Vitals/Pain Pain Assessment: No/denies pain    Home Living Family/patient expects to be discharged to:: Private residence Living Arrangements: Spouse/significant other Available Help at Discharge: Family Type of Home: House Home Access: Stairs to enter Entrance Stairs-Rails: Right Entrance Stairs-Number of Steps: 6 up, 6 down     Home Equipment: None      Prior Function Prior Level of Function : Independent/Modified Independent;Driving             Mobility Comments: Independent, retired Nature conservation officer; 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        Extremity/Trunk Assessment   Upper Extremity Assessment Upper Extremity Assessment: Overall WFL for tasks assessed    Lower Extremity Assessment Lower Extremity Assessment: Overall WFL for tasks assessed    Cervical / Trunk Assessment Cervical / Trunk Assessment: Normal  Communication   Communication: No difficulties  Cognition Arousal/Alertness: Awake/alert Behavior During Therapy: WFL for tasks assessed/performed Overall Cognitive Status: Within Functional Limits for tasks assessed  General Comments      Exercises     Assessment/Plan    PT Assessment Patient does not need any further PT services  PT Problem List         PT Treatment Interventions      PT Goals (Current goals can be found in the Care Plan section)  Acute Rehab PT Goals PT Goal Formulation: All assessment and education complete, DC therapy    Frequency     Barriers to discharge        Co-evaluation               AM-PAC PT "6 Clicks" Mobility  Outcome Measure Help needed turning from your back to your side while in a flat bed without using bedrails?: None Help needed moving from lying on your back to sitting on the side of a flat bed without using  bedrails?: None Help needed moving to and from a bed to a chair (including a wheelchair)?: None Help needed standing up from a chair using your arms (e.g., wheelchair or bedside chair)?: None Help needed to walk in hospital room?: None Help needed climbing 3-5 steps with a railing? : None 6 Click Score: 24    End of Session   Activity Tolerance: Patient tolerated treatment well Patient left: in bed;with call bell/phone within reach Nurse Communication: Mobility status PT Visit Diagnosis: Other abnormalities of gait and mobility (R26.89)    Time: 2376-2831 PT Time Calculation (min) (ACUTE ONLY): 10 min   Charges:   PT Evaluation $PT Eval Low Complexity: [place], PT, DPT Acute Rehabilitation Services  Pager [phone] Office [place] 07/16/2021, 10:58 AM

## 2021-07-17 ENCOUNTER — Other Ambulatory Visit: Payer: Self-pay | Admitting: Internal Medicine

## 2021-07-17 ENCOUNTER — Encounter (HOSPITAL_COMMUNITY): Payer: Self-pay | Admitting: Gastroenterology

## 2021-07-18 ENCOUNTER — Other Ambulatory Visit: Payer: Self-pay | Admitting: *Deleted

## 2021-07-18 ENCOUNTER — Encounter: Payer: Self-pay | Admitting: Gastroenterology

## 2021-07-18 ENCOUNTER — Telehealth: Payer: Self-pay | Admitting: Gastroenterology

## 2021-07-18 ENCOUNTER — Other Ambulatory Visit (HOSPITAL_COMMUNITY): Payer: Self-pay

## 2021-07-18 MED ORDER — SUCRALFATE 1 G PO TABS: 1.0000 g | ORAL_TABLET | Freq: Three times a day (TID) | ORAL | 1 refills | Status: DC

## 2021-07-18 MED ORDER — PANTOPRAZOLE SODIUM 40 MG PO TBEC: 40.0000 mg | DELAYED_RELEASE_TABLET | Freq: Two times a day (BID) | ORAL | 3 refills | Status: DC

## 2021-07-18 NOTE — Telephone Encounter (Signed)
Spoke with [facility] and confirmed they did have Carafate tablets on hand. Sent in new prescription for Carafate tablets. Spoke with patient to inform her of the change and instructed her how to make a slurry. Patient voiced understanding.

## 2021-07-18 NOTE — Telephone Encounter (Signed)
Spoke with patient and she would like to fax Pantoprazole twice daily to the [facility] to see if they will pay for twice daily. Prescription faxed.

## 2021-07-18 NOTE — Telephone Encounter (Signed)
Patient saw Dr. Rush Landmark in the hospital.  He prescribed Carafate and Pantoprazole.  Her insurance will not cover the Carafate and she was asking if there was an alternative.  The Pantoprazole needs prior authorization because of the amount that was prescribed.  Please reach out to patient if there is an issue with her request.  Thank you.

## 2021-07-19 LAB — SURGICAL PATHOLOGY

## 2021-07-19 NOTE — Telephone Encounter (Signed)
Not a Clinic Patient.  Medications have been refilled by PCP.

## 2021-07-20 ENCOUNTER — Other Ambulatory Visit (INDEPENDENT_AMBULATORY_CARE_PROVIDER_SITE_OTHER): Payer: Medicare Other

## 2021-07-20 ENCOUNTER — Encounter: Payer: Self-pay | Admitting: Gastroenterology

## 2021-07-20 ENCOUNTER — Other Ambulatory Visit: Payer: Self-pay

## 2021-07-20 DIAGNOSIS — K921 Melena: Secondary | ICD-10-CM

## 2021-07-20 LAB — CBC WITH DIFFERENTIAL/PLATELET
Basophils Absolute: 0 K/uL (ref 0.0–0.1)
Basophils Relative: 0.3 % (ref 0.0–3.0)
Eosinophils Absolute: 0.1 K/uL (ref 0.0–0.7)
Eosinophils Relative: 1.3 % (ref 0.0–5.0)
HCT: 28.4 % — ABNORMAL LOW (ref 36.0–46.0)
Hemoglobin: 9.5 g/dL — ABNORMAL LOW (ref 12.0–15.0)
Lymphocytes Relative: 23.1 % (ref 12.0–46.0)
Lymphs Abs: 1.2 K/uL (ref 0.7–4.0)
MCHC: 33.5 g/dL (ref 30.0–36.0)
MCV: 89.1 fl (ref 78.0–100.0)
Monocytes Absolute: 0.3 K/uL (ref 0.1–1.0)
Monocytes Relative: 5.7 % (ref 3.0–12.0)
Neutro Abs: 3.6 K/uL (ref 1.4–7.7)
Neutrophils Relative %: 69.6 % (ref 43.0–77.0)
Platelets: 148 K/uL — ABNORMAL LOW (ref 150.0–400.0)
RBC: 3.18 Mil/uL — ABNORMAL LOW (ref 3.87–5.11)
RDW: 14.5 % (ref 11.5–15.5)
WBC: 5.1 K/uL (ref 4.0–10.5)

## 2021-07-20 NOTE — Telephone Encounter (Signed)
Left message for patient to call office.  

## 2021-07-20 NOTE — Telephone Encounter (Signed)
Patient would like you to call her back regarding  prescriptions sent to the [place].  Please give patient a call.  Thank you.

## 2021-07-20 NOTE — Telephone Encounter (Signed)
Re-faxed prescription to [facility]. Received confirmation that prescription went successfully.

## 2021-07-25 NOTE — Progress Notes (Deleted)
Cardiology Office Note:    Date:  07/25/2021   ID:  Carrie Reynolds, DOB 02-Apr-1956, MRN [id number]  PCP:  Carrie Downing, MD [place] Cardiologist: Carrie Hew, MD   Reason for visit: Follow-up  History of Present Illness:    Carrie Reynolds is a 66 y.o. female with a hx of CAD with drug-eluting stent placed to the LAD, PACs, hypertension, diabetes, OSA on CPAP, hyperlipidemia and obesity.  She last saw Dr. Ellyn Reynolds in September 2022.  She continue with a fluttering sensation associated with shortness of breath.  No further episodes of syncope.  CAD -*** -Status post DES to LAD -Negative Myoview in April 2021 -Continue Plavix monotherapy  Palpitations PACs -Beta-blocker and calcium channel blocker stopped secondary to fatigue/brady; Dr. Ellyn Reynolds prescribed propanolol as needed for palpitations  Hypertension -*** -Goal BP is <130/80.  Recommend DASH diet (high in vegetables, fruits, low-fat dairy products, whole grains, poultry, fish, and nuts and low in sweets, sugar-sweetened beverages, and red meats), salt restriction and increase physical activity.  Hyperlipidemia -*** -Discussed cholesterol lowering diets - Mediterranean diet, DASH diet, vegetarian diet, low-carbohydrate diet and avoidance of trans fats.  Discussed healthier choice substitutes.  Nuts, high-fiber foods, and fiber supplements may also improve lipids.    Obesity -Did not tolerate Ozempic 2/2 GI upset -Discussed how even a 5-10% weight loss can have cardiovascular benefits.   -Recommend moderate intensity activity for 30 minutes 5 days/week and the DASH diet.  Disposition - Follow-up in ***     Past Medical History:  Diagnosis Date   Anxiety    Arnold-Chiari malformation ([place]) 2006   CAD S/P PCI 05/06/2019   05/06/2019: prox LAD (@ SPI) PCI with Resolute Onyx DES 3.5  x 8, Residual 60% PDA, Normal LVF; 07/02/2019: relook Cath for Anterior Ischemia on Mhoview --> CATH 70%  pre-stent/proximal edge dissection --> proximal overlapping DES (Resolute Onyx 3.5 x 8 - new & old stent post-dilated to 3.8 mm)   Cervical strain    syndrome   Cervicogenic headache    Concussion with loss of consciousness 10/09/2017   CTS (carpal tunnel syndrome)    Depression    GERD (gastroesophageal reflux disease)    Glucose intolerance (impaired glucose tolerance)    High coronary artery calcium score    Score of 1460.  Coronary CTA shows high-grade proximal-mid LAD stenosis (CT FFR 0.50).  Also PDA/PLA 60 to 70% stenosis (CTO FFR 0.75)   History of kidney stones    Hypertension    Migraine headache    with visual aura   Obesity    OSA (obstructive sleep apnea)    not treated   Postmenopausal    Vertigo    post concussive    Past Surgical History:  Procedure Laterality Date   BIOPSY  07/15/2021   Procedure: BIOPSY;  Surgeon: Carrie Reynolds., MD;  Location: [place];  Service: Gastroenterology;;   Lillard Anes     right great toe   CHOLECYSTECTOMY N/A 06/05/2018   Procedure: LAPAROSCOPIC CHOLECYSTECTOMY WITH POSSIBLE INTRAOPERATIVE CHOLANGIOGRAM;  Surgeon: Carrie Luna, MD;  Location: [place];  Service: General;  Laterality: N/A;   CORONARY STENT INTERVENTION N/A 05/12/2019   Procedure: CORONARY STENT INTERVENTION;  Surgeon: Carrie Man, MD;  Location: [place] [facility];  Service: Cardiovascular;;; Culprit-p-mLAD 95% 950% SP1) --> Scoring Balloon PTCA -> DES PCI (Resolute DES 3.5 x 38 - 3.8 mm; 0% LAD& 55% SP1). -->  SP1 was somewhat jailed with TIMI II flow  post PCI.   CORONARY STENT INTERVENTION N/A 07/02/2019   Procedure: CORONARY STENT INTERVENTION;  Surgeon: Carrie Man, MD;  Location: [place] [facility];  Service: Cardiovascular;; * 70% Proximal stent edge dissection (edge of previous stent that is patent) -->  successfully covered with additional RESOLUTE ONYX DES 3.5 mm x 8 mm overlapping proximal edge of previous stent (postdilated to 3.8 mm)    ENTEROSCOPY N/A 07/15/2021   Procedure: ENTEROSCOPY;  Surgeon: Carrie Reynolds., MD;  Location: [place];  Service: Gastroenterology;  Laterality: N/A;   GASTRIC BYPASS  11/2015   [facility]   GYNECOLOGIC CRYOSURGERY     HAND SURGERY     LEFT HEART CATH AND CORONARY ANGIOGRAPHY N/A 05/12/2019   Procedure: LEFT HEART CATH AND CORONARY ANGIOGRAPHY;  Surgeon: Carrie Man, MD;  Location: [place] [facility];  Service: Cardiovascular;;; Culprit-p-mLAD 95% 950% SP1) --> DES PCI.;  RPAV 60% -small caliber vessel, did not appear flow-limiting (medical management).  EF 55 to 60%.  Normal LVEDP.   LEFT HEART CATH AND CORONARY ANGIOGRAPHY N/A 07/02/2019   Procedure: LEFT HEART CATH AND CORONARY ANGIOGRAPHY;  Surgeon: Carrie Man, MD;  Location: [place] [facility];  Service: Cardiovascular;; CULPRIT = ~70% proximal edge dissection of previous stent (DES PCI overlapping stent placed) - jails SP1 w/ ~60-70% ostial stenosis.[name] mLAD 25%. RI 30%, RPAV 60%.   NM MYOVIEW LTD  06/24/2019   Carrie Reynolds): HIGH RISK.  EF 55%.  Large size, moderate severe defect in the anterior wall with associated anterior hypokinesis.  Suspect LAD territory.   NM MYOVIEW LTD  10/23/2019   Normal EF 55 to 60%.  No EKG changes.  Small size mild severity fixed defect in the apical wall consistent with breast attenuation versus small prior infarct.  Marked improvement from anterior apical perfusion abnormality seen in February 2020.   TRANSTHORACIC ECHOCARDIOGRAM  12/08/2020   Normal LV Fxn - EF 60-65%. No RWMA. Mod Asymmetric Basal Septa LVH with mild Concentric LVH. Gr II DD. Normal Longitudinal Strain. Normal RV size & fxn - normal RAP/CVP.  Normal MV. Mild AoV sclerosis w/ stenosis. Mild Ascending Ao dilation - ~38 mm (borderline).  => No change from 04/2017.   TUBAL LIGATION Bilateral    VAGINAL HYSTERECTOMY  2003   with LSO and right salpingectomy; right ovary still present    Current Medications: No outpatient  medications have been marked as taking for the 07/26/21 encounter (Appointment) with Carrie Lacy, PA-C.     Allergies:   Ceclor [cefaclor], Tetracyclines & related, Lipitor [atorvastatin calcium], Penicillins, Amoxicillin, Ampicillin, Lisinopril, and Simvastatin   Social History   Socioeconomic History   Marital status: Married    Spouse name: Lennette Fader   Number of children: 2   Years of education: 12   Highest education level: High school graduate  Occupational History   Occupation: Retired Best boy: [employer]    Comment: Logistics, warehouse  Tobacco Use   Smoking status: Former    Packs/day: 0.10    Years: 30.00    Pack years: 3.00    Types: Cigarettes    Quit date: 04/26/2012    Years since quitting: 9.2   Smokeless tobacco: Never  Vaping Use   Vaping Use: Never used  Substance and Sexual Activity   Alcohol use: Yes    Comment: occasional   Drug use: No   Sexual activity: Yes    Partners: Male    Birth control/protection: Surgical  Comment: hysterectomy  Other Topics Concern   Not on file  Social History Narrative   ** Merged History Encounter **       Lives with husband [name]). She travels very frequently with her job and is gone most weeks out of the month. She is active and exercises when able.   Social Determinants of Health   Financial Resource Strain: Not on file  Food Insecurity: Not on file  Transportation Needs: Not on file  Physical Activity: Not on file  Stress: Not on file  Social Connections: Not on file     Family History: The patient's family history includes Alzheimer's disease in her father and paternal grandmother; Colon cancer in her maternal grandmother; Heart failure in her maternal grandfather; Hyperlipidemia in her sister; Hypertension in her mother and sister; Lung cancer in her father and paternal grandfather; Migraines in her father.  ROS:   Please see the history of present illness.      EKGs/Labs/Other Studies Reviewed:    EKG:  The ekg ordered today demonstrates ***  Recent Labs: 07/15/2021: TSH 1.216 07/16/2021: ALT 16; BUN 16; Creatinine, Ser 0.87; Potassium 3.4; Sodium 140 07/20/2021: Hemoglobin 9.5; Platelets 148.0   Recent Lipid Panel Lab Results  Component Value Date/Time   CHOL 138 06/24/2020 11:52 AM   TRIG 151 (H) 06/24/2020 11:52 AM   HDL 60 06/24/2020 11:52 AM   LDLCALC 53 06/24/2020 11:52 AM    Physical Exam:    VS:  LMP 07/24/2003 (Within Months)    No data found.  Wt Readings from Last 3 Encounters:  07/16/21 202 lb 1.6 oz (91.7 kg)  04/12/21 214 lb 12.8 oz (97.4 kg)  02/08/21 212 lb 6.4 oz (96.3 kg)     GEN: *** Well nourished, well developed in no acute distress HEENT: Normal NECK: No JVD; No carotid bruits CARDIAC: ***RRR, no murmurs, rubs, gallops RESPIRATORY:  Clear to auscultation without rales, wheezing or rhonchi  ABDOMEN: Soft, non-tender, non-distended MUSCULOSKELETAL: No edema; No deformity  SKIN: Warm and dry NEUROLOGIC:  Alert and oriented PSYCHIATRIC:  Normal affect     ASSESSMENT AND PLAN   ***   {Are you ordering a CV Procedure (e.g. stress test, cath, DCCV, TEE, etc)?   Press F2        :[id number]}    Medication Adjustments/Labs and Tests Ordered: Current medicines are reviewed at length with the patient today.  Concerns regarding medicines are outlined above.  No orders of the defined types were placed in this encounter.  No orders of the defined types were placed in this encounter.   There are no Patient Instructions on file for this visit.   Signed, Carrie Lacy, PA-C  07/25/2021 9:13 PM    [facility]

## 2021-07-26 ENCOUNTER — Ambulatory Visit: Payer: Medicare Other | Admitting: Physician Assistant

## 2021-07-26 DIAGNOSIS — I2510 Atherosclerotic heart disease of native coronary artery without angina pectoris: Secondary | ICD-10-CM

## 2021-07-26 DIAGNOSIS — I491 Atrial premature depolarization: Secondary | ICD-10-CM

## 2021-07-26 DIAGNOSIS — I10 Essential (primary) hypertension: Secondary | ICD-10-CM

## 2021-07-26 DIAGNOSIS — I25119 Atherosclerotic heart disease of native coronary artery with unspecified angina pectoris: Secondary | ICD-10-CM

## 2021-07-26 DIAGNOSIS — E785 Hyperlipidemia, unspecified: Secondary | ICD-10-CM

## 2021-07-27 ENCOUNTER — Telehealth: Payer: Self-pay | Admitting: Cardiology

## 2021-07-27 NOTE — Telephone Encounter (Signed)
Patient was recently in the hospital and had some blood loss. She wanted to know if she should continue with her Blood thinner. She was scheduled for a hospital f/u for 07/26/21 but wrote the date down wrong and missed her appt. She is not scheduled to come in until 09/14/21.   The patient is active on MyChart and the office can communicate with her that way if needed.  Please advise

## 2021-07-27 NOTE — Telephone Encounter (Signed)
Spoke to patient. She states  she restarted  Plavix 75 mg on 07/19/21 as instructed by [place] doctor. Patient had recently been hospitalized for bleeding ulcer , anemia.  She wanted to know from Dr Ellyn Hack if  that would be okay. Regular Follow up appointment reschedule for 08/21/21. Will defer to Dr harding for  any further  update?

## 2021-07-28 ENCOUNTER — Telehealth: Payer: Self-pay | Admitting: Gastroenterology

## 2021-07-28 ENCOUNTER — Other Ambulatory Visit: Payer: Self-pay

## 2021-07-28 DIAGNOSIS — K922 Gastrointestinal hemorrhage, unspecified: Secondary | ICD-10-CM

## 2021-07-28 NOTE — Progress Notes (Signed)
Pt is aware that she needs to come in for labs at her earliest convenience. No further questions. Lab orders placed.

## 2021-07-28 NOTE — Telephone Encounter (Signed)
Patient called stated that she is still having the feeling of passing out like she had before. Patient has not actually passed out, but the feeling is off and on throughout the day. Also stated that she is having abd pain. Patient has appointment on 3/3, but Is seeking advice in the meantime. Please advise.

## 2021-07-28 NOTE — Telephone Encounter (Signed)
I think it is fine for her to restart when felt to be safe by GI medicine.  She should only be on Plavix and not on aspirin. Does not need to load with 300 mg, can simply just restart @ 75 mg daily.  If she has more signs of blood in her stool, would hold Plavix for 5 days.  [name], MD

## 2021-07-28 NOTE — Telephone Encounter (Signed)
Spoke to patient.  Direction given to patient per Dr Wilhemina Cash instructions.  Patient voiced understanding.

## 2021-07-28 NOTE — Telephone Encounter (Signed)
The pt tells me that she had an episode of dizziness today.  She did not pass out but she is concerned because of the recent hospitalization for GI bleed.  She has seen no dark stools or bleeding- no coffee ground emesis. She has some abd discomfort.  No nausea.  She had an appt with Dr Rush Landmark on 3/3.  I moved that appt up to 2/15.  She also has an appt on Monday with PCP. She is drinking and her husband is making sure she is eating.  He states that her BP med and blood thinner were stopped while hospitalized but upon discharge they were started back, he believes she may be hypotensive.  I advised her and her husband that if she continues to have dizziness or if she sees any dark stools or blood she should go to the ED for eval.  They agreed and I will send to Dr Rush Landmark for any further recommendations.

## 2021-08-06 ENCOUNTER — Other Ambulatory Visit: Payer: Self-pay | Admitting: Internal Medicine

## 2021-08-08 ENCOUNTER — Other Ambulatory Visit: Payer: Self-pay | Admitting: Internal Medicine

## 2021-08-14 NOTE — Telephone Encounter (Signed)
Pt called back & is aware that she can come in for labs at anytime this week during office hours.

## 2021-08-14 NOTE — Telephone Encounter (Signed)
Inbound call from patient states she is still experiencing episodes of dizziness not as bad but slightly. Is wondering if she should have her hemoglobin checked again before her appt

## 2021-08-14 NOTE — Telephone Encounter (Signed)
Unable to reach pt, left vm to call back. °

## 2021-08-15 ENCOUNTER — Other Ambulatory Visit (INDEPENDENT_AMBULATORY_CARE_PROVIDER_SITE_OTHER): Payer: Medicare Other

## 2021-08-15 DIAGNOSIS — K922 Gastrointestinal hemorrhage, unspecified: Secondary | ICD-10-CM | POA: Diagnosis not present

## 2021-08-15 LAB — CBC WITH DIFFERENTIAL/PLATELET
Basophils Absolute: 0 10*3/uL (ref 0.0–0.1)
Basophils Relative: 0.5 % (ref 0.0–3.0)
Eosinophils Absolute: 0.1 10*3/uL (ref 0.0–0.7)
Eosinophils Relative: 2.1 % (ref 0.0–5.0)
HCT: 35.9 % — ABNORMAL LOW (ref 36.0–46.0)
Hemoglobin: 11.8 g/dL — ABNORMAL LOW (ref 12.0–15.0)
Lymphocytes Relative: 33.1 % (ref 12.0–46.0)
Lymphs Abs: 1.4 10*3/uL (ref 0.7–4.0)
MCHC: 32.9 g/dL (ref 30.0–36.0)
MCV: 89.7 fl (ref 78.0–100.0)
Monocytes Absolute: 0.3 10*3/uL (ref 0.1–1.0)
Monocytes Relative: 6.5 % (ref 3.0–12.0)
Neutro Abs: 2.5 10*3/uL (ref 1.4–7.7)
Neutrophils Relative %: 57.8 % (ref 43.0–77.0)
Platelets: 162 10*3/uL (ref 150.0–400.0)
RBC: 4.01 Mil/uL (ref 3.87–5.11)
RDW: 14.9 % (ref 11.5–15.5)
WBC: 4.3 10*3/uL (ref 4.0–10.5)

## 2021-08-15 LAB — COMPREHENSIVE METABOLIC PANEL
ALT: 11 U/L (ref 0–35)
AST: 12 U/L (ref 0–37)
Albumin: 4.3 g/dL (ref 3.5–5.2)
Alkaline Phosphatase: 55 U/L (ref 39–117)
BUN: 19 mg/dL (ref 6–23)
CO2: 26 mEq/L (ref 19–32)
Calcium: 9.4 mg/dL (ref 8.4–10.5)
Chloride: 104 mEq/L (ref 96–112)
Creatinine, Ser: 0.89 mg/dL (ref 0.40–1.20)
GFR: 68.64 mL/min (ref >60.00)
Glucose, Bld: 143 mg/dL — ABNORMAL HIGH (ref 70–99)
Potassium: 3.8 mEq/L (ref 3.5–5.1)
Sodium: 139 mEq/L (ref 135–145)
Total Bilirubin: 0.7 mg/dL (ref 0.2–1.2)
Total Protein: 6.8 g/dL (ref 6.0–8.3)

## 2021-08-21 ENCOUNTER — Ambulatory Visit (INDEPENDENT_AMBULATORY_CARE_PROVIDER_SITE_OTHER): Payer: Medicare Other | Admitting: Cardiology

## 2021-08-21 ENCOUNTER — Other Ambulatory Visit: Payer: Self-pay

## 2021-08-21 ENCOUNTER — Encounter: Payer: Self-pay | Admitting: Cardiology

## 2021-08-21 VITALS — BP 138/74 | HR 68 | Ht 67.0 in | Wt 201.0 lb

## 2021-08-21 DIAGNOSIS — I10 Essential (primary) hypertension: Secondary | ICD-10-CM

## 2021-08-21 DIAGNOSIS — R001 Bradycardia, unspecified: Secondary | ICD-10-CM

## 2021-08-21 DIAGNOSIS — E785 Hyperlipidemia, unspecified: Secondary | ICD-10-CM | POA: Diagnosis not present

## 2021-08-21 DIAGNOSIS — I2510 Atherosclerotic heart disease of native coronary artery without angina pectoris: Secondary | ICD-10-CM | POA: Diagnosis not present

## 2021-08-21 DIAGNOSIS — I208 Other forms of angina pectoris: Secondary | ICD-10-CM | POA: Diagnosis not present

## 2021-08-21 DIAGNOSIS — R002 Palpitations: Secondary | ICD-10-CM | POA: Diagnosis not present

## 2021-08-21 DIAGNOSIS — Z9861 Coronary angioplasty status: Secondary | ICD-10-CM

## 2021-08-21 DIAGNOSIS — K289 Gastrojejunal ulcer, unspecified as acute or chronic, without hemorrhage or perforation: Secondary | ICD-10-CM | POA: Insufficient documentation

## 2021-08-21 DIAGNOSIS — E669 Obesity, unspecified: Secondary | ICD-10-CM

## 2021-08-21 MED ORDER — PROPRANOLOL HCL 10 MG PO TABS: ORAL_TABLET | ORAL | 11 refills | Status: DC

## 2021-08-21 NOTE — Progress Notes (Signed)
Primary Care Provider: Leonard Downing, MD Cardiologist: [name], MD Electrophysiologist: None  Clinic Note: Chief Complaint  Patient presents with   Follow-up    4-5 months.   Hospitalization Follow-up    Recently admitted for GI bleed/syncope   ===================================  ASSESSMENT/PLAN   Problem List Items Addressed This Visit       Cardiology Problems   CAD S/P DES PCI-proximal LAD - Primary (Chronic)    2 overlapping stents in the proximal LAD.  No further anginal symptoms.  She has had nuclear stress test in April 2021 and 2022 both of which were nonischemic.  Actually since treatment of her anemia, she is feeling much better.  With no active cardiac symptoms, would be fine to proceed with GI procedure coming up in the next month or so.  No need for further cardiac testing.  Not on beta-blocker because of bradycardia as discussed. On ARB and HCTZ combined. On 40 mg rosuvastatin. On maintenance dose Plavix which was restarted post hospital. Okay to hold Plavix 5 to 7 days preop for surgeries or procedures.  Would hold for 7 days prior to upcoming GI procedure. Also okay to hold Plavix 3 to 4 days at the sign of bleeding (GI bleed, nosebleed, hematuria or significant bruising).      Relevant Medications   propranolol (INDERAL) 10 MG tablet   Other Relevant Orders   Hepatic function panel   Lipid panel   Essential (primary) hypertension (Chronic)    Blood pressure is borderline high today but in light of her recent syncope from anemia, would be a little bit less aggressive at this point.  Plan: Continue losartan-HCTZ at one half of the 100-12.5 mg tablet.  Low threshold to titrate to full dose      Relevant Medications   propranolol (INDERAL) 10 MG tablet   Hyperlipidemia with target LDL less than 70 (Chronic)    Labs as of December 2021 looked great.  LDL 53 at that time.  She should be due for having labs checked soon.  This was not done  because she was in the hospital.  We will allow her to wait till her blood counts are rechecked in March.  We can add lipid panel. Plan: Continue current dose of rosuvastatin.       Relevant Medications   propranolol (INDERAL) 10 MG tablet   Other Relevant Orders   Hepatic function panel   Lipid panel   Atypical angina (HCC) (Chronic)    Thankfully, no further episodes of chest pain or pressure.  She had atypical symptoms in the time of her PCI both times.  Thankfully, no further symptoms at this point.  She has had 2 negative stress test in the last 2 years.  Not currently on beta-blocker.  Low threshold to consider dihydropyridine calcium channel blocker for additional blood pressure and antianginal control.      Relevant Medications   propranolol (INDERAL) 10 MG tablet   Sinus bradycardia (Chronic)    No longer on beta-blocker as a standing dose.  She has heart rate of 68.  Still acceptable to use short acting propranolol as needed for tachycardia spells.  These are not happening as frequently.  Suspect it could have been related anemia.      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  Should have repeat study upcoming.  Safely back on clopidogrel, following hemoglobin levels.  No longer on aspirin as aspirin more likely to irritate gastric mucosa. Low threshold to hold Plavix as needed for bleeding issues as well as for procedures as noted.      Obesity (BMI 30.0-34.9) (Chronic)    Did not tolerate Ozempic. Working on diet and exercise.  She has lost 13 pounds since her September visit.  I corrected her efforts.  At this point BMI is 31.5.  Doing well.  Encourage continued efforts.      Rapid palpitations (Chronic)    Less frequent palpitations.  No longer on beta-blocker because of bradycardia.  I reassured her that she is safe to take PRN propranolol for this  palpitation spells especially if she is on to feel lightheaded or dizzy.  These are usually associated with anxiety and I think there would also be anxiety.  Refill PRN propranolol prescription.       ===================================  HPI:    Carrie Reynolds is a 66 y.o. female with a PMH below who presents today for hospital follow-up-admitted for syncope-found to have acute upper GI bleed secondary to jejunal ulcers..  CAD History: 05/11/20: DES PCI pLAD Resolute Onyx DES 3.5 x 8. 60% PDA. Recurrent Sx - Abnormal Myoview - anterior ischemia --> relook Cath Cath - PCI 07/02/19: 70% pre-stent prox edge dissection,  patent p-mLAD stent, 55% SP1 (jailed by PCI). mLAD 25%. RI 30%, RPAV 60%.  --> 70% prox LAD = covered with 2nd Resolute Onyx DES 3.5 x 8 mm -- 3.8 mm).   F/u Myoview 10/2019: EF 55-60%. Small sized fixed apical defect - NO ISCHEMIA.  LOW RISK. 10/24/2020 - Nuclear ST ([place], [place]): by Report - non-ischemic.  DM-2 - Dec 2021 - did not feel well after starting Ozempic. - GI upset.  April 2022 - ER visit to [facility] & Near Syncope -> thought to be dehydration & Diltiazem stopped (HR noted to be in 40s).  Post-Hosp f/u with Sande Rives, PA-C-- noted L Arm pain & Near Syncope -> feeling better, no further episodes, just poor balance.  NO CHF: Echo ordered. Continued Bystolic, but d/c Diltiazem.  Alba Perillo was last seen on April 12, 2021.  No longer on Bystolic because of bradycardia, but did note having some palpitations as PACs.  Plan was PRN propranolol -> did not tolerate Ozempic  Recent Hospitalizations:  Admitted overnight 12/24-25/2022: Upper GI bleed-syncope.  Plan was to Protonix 40 mg twice daily, Carafate x1 month with iron supplementation.  [facility] follow-up for EGD in 4 weeks.  Follow-up CBC in 1 week.  H. pylori testing. Plavix held until 07/19/2021.  PRN propranolol ordered for  anxiety/palpitations (not filled)  Reviewed  CV studies:    The following studies were reviewed today: (if available, images/films reviewed: From Epic Chart or Care Everywhere) 14-day Zio patch July 2022: Predominant rhythm sinus-heart rate range 40 to 124 bpm.  Average 70 bpm.  Rare PVCs and isolated PACs noted.  PAC couplets and triplets also noted as well as trigeminy.  3 atrial runs of 4-5 beats ranging from 102 to 150 bpm.  (2 of 3 noted on patient trigger.  Remainder patient triggers were sinus rhythm with PACs or PVCs); no atrial fibrillation or other sustained arrhythmias noted.   Interval History:   Aralynn Brake returns here today for follow-up after hospitalization doing fairly well.Since her discharge, she has been  feeling much better.  No further syncope or near syncopal type symptoms.  She is starting to wonder if this recent spell where she was found to have anemia could explain some of the symptoms she has been having over the last 6 to 8 months.  She feeling much better since discharge.  She is not having any more episodes of syncope or near syncope.  She really says that the irregular heartbeats stabilized as well.  No real prolonged rapid irregular heartbeats or palpitations.  Just a few skipped beats here and there.  Nothing rapid or prolonged.  No further episodes of chest pain or pressure with rest or exertion.  No heart failure symptoms.  CV Review of Symptoms (Summary) Cardiovascular ROS: no chest pain or dyspnea on exertion positive for - - Since discharge, only rare skipped beats.  Nothing prolonged.  Lightheadedness has resolved. negative for - edema, orthopnea, paroxysmal nocturnal dyspnea, rapid heart rate, shortness of breath, or Further episodes of syncope/near syncope or TIA/amaurosis fugax.  No claudication.  REVIEWED OF SYSTEMS   Symptoms since discharge.  Review of Systems  Constitutional:  Negative for malaise/fatigue (Energy level is definitely  improving.  More active now.) and weight loss (She is working on it.  Changing her diet and increased exercise).  HENT:  Negative for congestion and sinus pain.   Respiratory:  Positive for shortness of breath (Only if she overexerts). Negative for cough.   Cardiovascular:        Per HPI  Gastrointestinal:  Negative for blood in stool and melena.       Since discharge: No further GI bleeding issues.  No melena, hematochezia  Genitourinary:  Negative for hematuria.  Musculoskeletal:  Positive for joint pain. Negative for falls and myalgias.  Neurological:  Negative for dizziness (May be a little bit orthostatic every now and then, but nothing like when she went to the hospital.), focal weakness and weakness.  Psychiatric/Behavioral: Negative.     I have reviewed and (if needed) personally updated the patient's problem list, medications, allergies, past medical and surgical history, social and family history.   PAST MEDICAL HISTORY   Past Medical History:  Diagnosis Date   Anxiety    Arnold-Chiari malformation ([place]) 2006   CAD S/P PCI 05/06/2019   05/06/2019: prox LAD (@ SPI) PCI with Resolute Onyx DES 3.5  x 8, Residual 60% PDA, Normal LVF; 07/02/2019: relook Cath for Anterior Ischemia on [place] --> CATH 70% pre-stent/proximal edge dissection --> proximal overlapping DES (Resolute Onyx 3.5 x 8 - new & old stent post-dilated to 3.8 mm)   Cervical strain    syndrome   Cervicogenic headache    Concussion with loss of consciousness 10/09/2017   CTS (carpal tunnel syndrome)    Depression    GERD (gastroesophageal reflux disease)    Glucose intolerance (impaired glucose tolerance)    High coronary artery calcium score    Score of 1460.  Coronary CTA shows high-grade proximal-mid LAD stenosis (CT FFR 0.50).  Also PDA/PLA 60 to 70% stenosis (CTO FFR 0.75)   History of kidney stones    Hypertension    Migraine headache    with visual aura   Obesity    OSA (obstructive sleep apnea)    not  treated   Postmenopausal    Vertigo    post concussive    PAST SURGICAL HISTORY   Past Surgical History:  Procedure Laterality Date   BIOPSY  07/15/2021   Procedure: BIOPSY;  Surgeon: Rush Landmark,  Telford Nab., MD;  Location: [facility];  Service: Gastroenterology;;   Lillard Anes     right great toe   CHOLECYSTECTOMY N/A 06/05/2018   Procedure: LAPAROSCOPIC CHOLECYSTECTOMY WITH POSSIBLE INTRAOPERATIVE CHOLANGIOGRAM;  Surgeon: Erroll Luna, MD;  Location: [facility];  Service: General;  Laterality: N/A;   CORONARY STENT INTERVENTION N/A 05/12/2019   Procedure: CORONARY STENT INTERVENTION;  Surgeon: Leonie Man, MD;  Location: [place] [facility];  Service: Cardiovascular;;; Culprit-p-mLAD 95% 950% SP1) --> Scoring Balloon PTCA -> DES PCI (Resolute DES 3.5 x 38 - 3.8 mm; 0% LAD& 55% SP1). -->  SP1 was somewhat jailed with TIMI II flow post PCI.   CORONARY STENT INTERVENTION N/A 07/02/2019   Procedure: CORONARY STENT INTERVENTION;  Surgeon: Leonie Man, MD;  Location: [place] [facility];  Service: Cardiovascular;; * 70% Proximal stent edge dissection (edge of previous stent that is patent) -->  successfully covered with additional RESOLUTE ONYX DES 3.5 mm x 8 mm overlapping proximal edge of previous stent (postdilated to 3.8 mm)   ENTEROSCOPY N/A 07/15/2021   Procedure: ENTEROSCOPY;  Surgeon: Rush Landmark Telford Nab., MD;  Location: [facility];  Service: Gastroenterology;  Laterality: N/A;   GASTRIC BYPASS  11/2015   [facility]   GYNECOLOGIC CRYOSURGERY     HAND SURGERY     LEFT HEART CATH AND CORONARY ANGIOGRAPHY N/A 05/12/2019   Procedure: LEFT HEART CATH AND CORONARY ANGIOGRAPHY;  Surgeon: Leonie Man, MD;  Location: [facility];  Service: Cardiovascular;;; Culprit-p-mLAD 95% 950% SP1) --> DES PCI.;  RPAV 60% -small caliber vessel, did not appear flow-limiting (medical management).  EF 55 to 60%.  Normal LVEDP.   LEFT HEART CATH AND CORONARY ANGIOGRAPHY N/A 07/02/2019    Procedure: LEFT HEART CATH AND CORONARY ANGIOGRAPHY;  Surgeon: Leonie Man, MD;  Location: [place] [facility];  Service: Cardiovascular;; CULPRIT = ~70% proximal edge dissection of previous stent (DES PCI overlapping stent placed) - jails SP1 w/ ~60-70% ostial stenosis.[name] mLAD 25%. RI 30%, RPAV 60%.   NM MYOVIEW LTD  06/24/2019   [facility] Adam): HIGH RISK.  EF 55%.  Large size, moderate severe defect in the anterior wall with associated anterior hypokinesis.  Suspect LAD territory.   NM MYOVIEW LTD  10/23/2019   Normal EF 55 to 60%.  No EKG changes.  Small size mild severity fixed defect in the apical wall consistent with breast attenuation versus small prior infarct.  Marked improvement from anterior apical perfusion abnormality seen in February 2020.   TRANSTHORACIC ECHOCARDIOGRAM  12/08/2020   Normal LV Fxn - EF 60-65%. No RWMA. Mod Asymmetric Basal Septa LVH with mild Concentric LVH. Gr II DD. Normal Longitudinal Strain. Normal RV size & fxn - normal RAP/CVP.  Normal MV. Mild AoV sclerosis w/ stenosis. Mild Ascending Ao dilation - ~38 mm (borderline).  => No change from 04/2017.   TUBAL LIGATION Bilateral    VAGINAL HYSTERECTOMY  2003   with LSO and right salpingectomy; right ovary still present   ZIO [facility] MONITOR-14-day  01/2021   Predominant rhythm sinus-heart rate range 40 to 124 bpm.  Average 70 bpm.  Rare PVCs and isolated PACs noted.  PAC couplets and triplets also noted as well as trigeminy.  3 atrial runs of 4-5 beats ranging from 102 to 150 bpm.  (2 of 3 noted on patient trigger.  Remainder patient triggers were sinus rhythm with PACs or PVCs); no atrial fibrillation or other sustained arrhythmias noted.   Relook CATH-PCI 07/02/2019: proxLAD proximal edge  dissection - covered with 2nd Resolute Onyx DES 3.5 x 8 (post-dilated to 3.8 mm)    Immunization History  Administered Date(s) Administered   Influenza Inj Mdck Quad Pf 04/03/2018   Influenza,inj,Quad PF,6+ Mos 04/08/2017   Janssen  (J&J) SARS-COV-2 Vaccination 09/29/2019   Td 07/23/2008   Tdap 05/24/2019   Zoster Recombinat (Shingrix) 04/03/2018    MEDICATIONS/ALLERGIES   Current Meds  Medication Sig   clopidogrel (PLAVIX) 75 MG tablet TAKE 1 TABLET BY MOUTH EVERY DAY (Patient taking differently: Take 75 mg by mouth daily.)   ferrous sulfate 325 (65 FE) MG tablet Take 1 tablet (325 mg total) by mouth daily with breakfast.   losartan-hydrochlorothiazide (HYZAAR) 100-12.5 MG tablet Take 0.5 tablets by mouth daily.   nitroGLYCERIN (NITROSTAT) 0.4 MG SL tablet Place 1 tablet (0.4 mg total) under the tongue every 5 (five) minutes as needed.   pantoprazole (PROTONIX) 40 MG tablet Take 1 tablet (40 mg total) by mouth 2 (two) times daily.   propranolol (INDERAL) 10 MG tablet May take  1 to 2  tablets a day  as needed for palpiptation   rosuvastatin (CRESTOR) 40 MG tablet Take 40 mg by mouth daily.   sertraline (ZOLOFT) 50 MG tablet Take 50 mg by mouth in the morning and at bedtime.   sucralfate (CARAFATE) 1 g tablet Take 1 tablet (1 g total) by mouth 4 (four) times daily -  with meals and at bedtime.    Allergies  Allergen Reactions   Ceclor [Cefaclor] Hives and Rash   Tetracyclines & Related Hives   Lipitor [Atorvastatin Calcium] Cough   Penicillins Swelling and Other (See Comments)    Has patient had a PCN reaction causing immediate rash, facial/tongue/throat swelling, SOB or lightheadedness with hypotension: No Has patient had a PCN reaction causing severe rash involving mucus membranes or skin necrosis: No Has patient had a PCN reaction that required hospitalization: Yes Has patient had a PCN reaction occurring within the last 10 years: No If all of the above answers are "NO", then may proceed with Cephalosporin use.   Amoxicillin Rash   Ampicillin Rash   Lisinopril Cough   Simvastatin Cough    SOCIAL HISTORY/FAMILY HISTORY   Reviewed in Epic:  Pertinent findings:  Social History   Tobacco Use   Smoking  status: Former    Packs/day: 0.10    Years: 30.00    Pack years: 3.00    Types: Cigarettes    Quit date: 04/26/2012    Years since quitting: 9.3   Smokeless tobacco: Never  Vaping Use   Vaping Use: Never used  Substance Use Topics   Alcohol use: Yes    Comment: occasional   Drug use: No   Social History   Social History Narrative   ** Merged History Encounter **       Lives with husband [name]). She travels very frequently with her job and is gone most weeks out of the month. She is active and exercises when able.    OBJCTIVE -PE, EKG, labs   Wt Readings from Last 3 Encounters:  08/21/21 201 lb (91.2 kg)  07/16/21 202 lb 1.6 oz (91.7 kg)  04/12/21 214 lb 12.8 oz (97.4 kg)    Physical Exam: BP 138/74 (BP Location: Left Arm, Patient Position: Sitting, Cuff Size: Normal)    Pulse 68    Ht 5\' 7"  (1.702 m)    Wt 201 lb (91.2 kg)    LMP 07/24/2003 (Within Months)    BMI  31.48 kg/m  Physical Exam Vitals reviewed.  Constitutional:      General: She is not in acute distress.    Appearance: Normal appearance. She is obese. She is not ill-appearing or toxic-appearing.     Comments: Well-nourished, well-groomed.  Healthy-appearing.  HENT:     Head: Normocephalic and atraumatic.  Neck:     Vascular: No carotid bruit or JVD.  Cardiovascular:     Rate and Rhythm: Normal rate and regular rhythm. Occasional Extrasystoles are present.    Chest Wall: PMI is not displaced.     Pulses: Normal pulses.     Heart sounds: S1 normal and S2 normal. Heart sounds are distant. Murmur (Cannot exclude soft 1/6 SEM at RUSB but very soft.) heard.    No friction rub. No gallop.  Pulmonary:     Effort: Pulmonary effort is normal. No respiratory distress.     Breath sounds: Normal breath sounds. No wheezing, rhonchi or rales.  Chest:     Chest wall: No tenderness.  Musculoskeletal:        General: No swelling. Normal range of motion.     Cervical back: Normal range of motion and neck supple.   Skin:    General: Skin is warm and dry.  Neurological:     General: No focal deficit present.     Mental Status: She is alert and oriented to person, place, and time.  Psychiatric:        Mood and Affect: Mood normal.        Behavior: Behavior normal.        Thought Content: Thought content normal.        Judgment: Judgment normal.    Adult ECG Report Not checked  Recent Labs:  Will add Lipid Panel to March Labs  Lab Results  Component Value Date   CHOL 138 06/24/2020   HDL 60 06/24/2020   LDLCALC 53 06/24/2020   TRIG 151 (H) 06/24/2020   CHOLHDL 2.3 06/24/2020   Lab Results  Component Value Date   CREATININE 0.89 08/15/2021   BUN 19 08/15/2021   NA 139 08/15/2021   K 3.8 08/15/2021   CL 104 08/15/2021   CO2 26 08/15/2021   CBC Latest Ref Rng & Units 08/15/2021 07/20/2021 07/16/2021  WBC 4.0 - 10.5 K/uL 4.3 5.1 -  Hemoglobin 12.0 - 15.0 g/dL 11.8(L) 9.5(L) 8.9(L)  Hematocrit 36.0 - 46.0 % 35.9(L) 28.4(L) 26.6(L)  Platelets 150.0 - 400.0 K/uL 162.0 148.0(L) -    No results found for: HGBA1C Lab Results  Component Value Date   TSH 1.216 07/15/2021    ==================================================  COVID-19 Education: The signs and symptoms of COVID-19 were discussed with the patient and how to seek care for testing (follow up with PCP or arrange E-visit).    I spent a total of 24 minutes with the patient spent in direct patient consultation.  Additional time spent with chart review  / charting (studies, outside notes, etc): 17 min Total Time: 41 min  Current medicines are reviewed at length with the patient today.  (+/- concerns) none  This visit occurred during the SARS-CoV-2 public health emergency.  Safety protocols were in place, including screening questions prior to the visit, additional usage of staff PPE, and extensive cleaning of exam room while observing appropriate contact time as indicated for disinfecting solutions.  Notice: This dictation was  prepared with Dragon dictation along with smart phrase technology. Any transcriptional errors that result from this process are unintentional and may  not be corrected upon review.  Studies Ordered:   Orders Placed This Encounter  Procedures   Hepatic function panel   Lipid panel    Patient Instructions / Medication Changes & Studies & Tests Ordered   Patient Instructions  Medication Instructions:     PLACED PROPRANOLOL 10 MG  BACK ON MEDICATION LIST  *If you need a refill on your cardiac medications before your next appointment, please call your pharmacy*   Lab Work: LIPID  LIVER FUNCTION PANEL  If you have labs (blood work) drawn today and your tests are completely normal, you will receive your results only by: MyChart Message (if you have MyChart) OR A paper copy in the mail If you have any lab test that is abnormal or we need to change your treatment, we will call you to review the results.   Testing/Procedures:  NOT NEEDED  Follow-Up: At [facility], you and your health needs are our priority.  As part of our continuing mission to provide you with exceptional heart care, we have created designated Provider Care Teams.  These Care Teams include your primary Cardiologist (physician) and Advanced Practice Providers (APPs -  Physician Assistants and Nurse Practitioners) who all work together to provide you with the care you need, when you need it.     Your next appointment:   6 month(s)  The format for your next appointment:   In Person  Provider:   [name], MD    Other Instructions      [name], M.D., M.S. Interventional Cardiologist   Pager # [phone] Phone # [phone] [address]. [place], [place] [address]   Thank you for choosing Heartcare at [facility]!!

## 2021-08-21 NOTE — Patient Instructions (Addendum)
Medication Instructions:     PLACED PROPRANOLOL 10 MG  BACK ON MEDICATION LIST  *If you need a refill on your cardiac medications before your next appointment, please call your pharmacy*   Lab Work: LIPID  LIVER FUNCTION PANEL  If you have labs (blood work) drawn today and your tests are completely normal, you will receive your results only by: [place] (if you have MyChart) OR A paper copy in the mail If you have any lab test that is abnormal or we need to change your treatment, we will call you to review the results.   Testing/Procedures:  NOT NEEDED  Follow-Up: At [facility], you and your health needs are our priority.  As part of our continuing mission to provide you with exceptional heart care, we have created designated Provider Care Teams.  These Care Teams include your primary Cardiologist (physician) and Advanced Practice Providers (APPs -  Physician Assistants and Nurse Practitioners) who all work together to provide you with the care you need, when you need it.     Your next appointment:   6 month(s)  The format for your next appointment:   In Person  Provider:   [name], MD    Other Instructions

## 2021-08-22 ENCOUNTER — Encounter: Payer: Self-pay | Admitting: Cardiology

## 2021-08-23 ENCOUNTER — Encounter: Payer: Self-pay | Admitting: Cardiology

## 2021-08-23 NOTE — Assessment & Plan Note (Signed)
Blood pressure is borderline high today but in light of her recent syncope from anemia, would be a little bit less aggressive at this point.  Plan: Continue losartan-HCTZ at one half of the 100-12.5 mg tablet.  Low threshold to titrate to full dose

## 2021-08-23 NOTE — Assessment & Plan Note (Signed)
Labs as of December 2021 looked great.  LDL 53 at that time.  She should be due for having labs checked soon.  This was not done because she was in the hospital.  We will allow her to wait till her blood counts are rechecked in March.  We can add lipid panel. Plan: Continue current dose of rosuvastatin.

## 2021-08-23 NOTE — Assessment & Plan Note (Signed)
Less frequent palpitations.  No longer on beta-blocker because of bradycardia.  I reassured her that she is safe to take PRN propranolol for this palpitation spells especially if she is on to feel lightheaded or dizzy.  These are usually associated with anxiety and I think there would also be anxiety.  Refill PRN propranolol prescription.

## 2021-08-23 NOTE — Assessment & Plan Note (Signed)
No longer on beta-blocker as a standing dose.  She has heart rate of 68.  Still acceptable to use short acting propranolol as needed for tachycardia spells.  These are not happening as frequently.  Suspect it could have been related anemia.

## 2021-08-23 NOTE — Assessment & Plan Note (Addendum)
Thankfully, no further episodes of chest pain or pressure.  She had atypical symptoms in the time of her PCI both times.  Thankfully, no further symptoms at this point.  She has had 2 negative stress test in the last 2 years.  Not currently on beta-blocker.  Low threshold to consider dihydropyridine calcium channel blocker for additional blood pressure and antianginal control.

## 2021-08-23 NOTE — Assessment & Plan Note (Signed)
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  Should have repeat study upcoming.  Safely back on clopidogrel, following hemoglobin levels.  No longer on aspirin as aspirin more likely to irritate gastric mucosa. Low threshold to hold Plavix as needed for bleeding issues as well as for procedures as noted.

## 2021-08-23 NOTE — Assessment & Plan Note (Signed)
Did not tolerate Ozempic. Working on diet and exercise.  She has lost 13 pounds since her September visit.  I corrected her efforts.  At this point BMI is 31.5.  Doing well.  Encourage continued efforts.

## 2021-08-23 NOTE — Assessment & Plan Note (Signed)
2 overlapping stents in the proximal LAD.  No further anginal symptoms.  She has had nuclear stress test in April 2021 and 2022 both of which were nonischemic.  Actually since treatment of her anemia, she is feeling much better.  With no active cardiac symptoms, would be fine to proceed with GI procedure coming up in the next month or so.  No need for further cardiac testing.   Not on beta-blocker because of bradycardia as discussed.  On ARB and HCTZ combined.  On 40 mg rosuvastatin.  On maintenance dose Plavix which was restarted post hospital.  Okay to hold Plavix 5 to 7 days preop for surgeries or procedures.  Would hold for 7 days prior to upcoming GI procedure.  Also okay to hold Plavix 3 to 4 days at the sign of bleeding (GI bleed, nosebleed, hematuria or significant bruising).

## 2021-09-06 ENCOUNTER — Encounter: Payer: Self-pay | Admitting: Gastroenterology

## 2021-09-06 ENCOUNTER — Other Ambulatory Visit (INDEPENDENT_AMBULATORY_CARE_PROVIDER_SITE_OTHER): Payer: Medicare Other

## 2021-09-06 ENCOUNTER — Ambulatory Visit (INDEPENDENT_AMBULATORY_CARE_PROVIDER_SITE_OTHER): Payer: Medicare Other | Admitting: Gastroenterology

## 2021-09-06 VITALS — BP 162/90 | HR 59 | Ht 67.0 in | Wt 201.0 lb

## 2021-09-06 DIAGNOSIS — Z1211 Encounter for screening for malignant neoplasm of colon: Secondary | ICD-10-CM | POA: Diagnosis not present

## 2021-09-06 DIAGNOSIS — R6889 Other general symptoms and signs: Secondary | ICD-10-CM

## 2021-09-06 DIAGNOSIS — E611 Iron deficiency: Secondary | ICD-10-CM

## 2021-09-06 DIAGNOSIS — R140 Abdominal distension (gaseous): Secondary | ICD-10-CM

## 2021-09-06 DIAGNOSIS — E538 Deficiency of other specified B group vitamins: Secondary | ICD-10-CM | POA: Diagnosis not present

## 2021-09-06 DIAGNOSIS — Z7902 Long term (current) use of antithrombotics/antiplatelets: Secondary | ICD-10-CM

## 2021-09-06 DIAGNOSIS — D62 Acute posthemorrhagic anemia: Secondary | ICD-10-CM | POA: Diagnosis not present

## 2021-09-06 DIAGNOSIS — K289 Gastrojejunal ulcer, unspecified as acute or chronic, without hemorrhage or perforation: Secondary | ICD-10-CM

## 2021-09-06 DIAGNOSIS — K633 Ulcer of intestine: Secondary | ICD-10-CM

## 2021-09-06 LAB — CBC
HCT: 39.8 % (ref 36.0–46.0)
Hemoglobin: 13.1 g/dL (ref 12.0–15.0)
MCHC: 32.9 g/dL (ref 30.0–36.0)
MCV: 86.9 fl (ref 78.0–100.0)
Platelets: 176 10*3/uL (ref 150.0–400.0)
RBC: 4.58 Mil/uL (ref 3.87–5.11)
RDW: 13.6 % (ref 11.5–15.5)
WBC: 4.5 10*3/uL (ref 4.0–10.5)

## 2021-09-06 LAB — RETICULOCYTES
ABS Retic: 57720 cells/uL (ref 20000–80000)
Retic Ct Pct: 1.3 %

## 2021-09-06 LAB — FOLATE: Folate: 12.5 ng/mL (ref >5.9)

## 2021-09-06 LAB — VITAMIN B12: Vitamin B-12: 790 pg/mL (ref 211–911)

## 2021-09-06 MED ORDER — NA SULFATE-K SULFATE-MG SULF 17.5-3.13-1.6 GM/177ML PO SOLN: 1.0000 | ORAL | 0 refills | Status: DC

## 2021-09-06 NOTE — Progress Notes (Signed)
[facility] VISIT   Primary Care Provider Leonard Downing, MD [address] [place] [address] [phone]  Patient Profile: Carrie Reynolds is a 66 y.o. female with a pmh significant for CAD (status post PCI on Plavix), Arnold-Chiari malformation of brain, MDD, anxiety, nephrolithiasis, hypertension, obesity, OSA, status post cholecystectomy, GERD, Barrett's esophagus, status post gastric bypass with gastrojejunal anastomosis ulcer, jejunal ulcers.  The patient presents to the [facility] for an evaluation and management of problem(s) noted below:  Problem List 1. Gastrojejunal ulceration   2. Ulcer of small intestine   3. Acute blood loss anemia   4. Iron deficiency   5. Bloating symptom   6. Colon cancer screening   7. Long term (current) use of antithrombotics/antiplatelets     History of Present Illness This is a patient that I met during a recent hospitalization who presented with chronic abdominal pain and bloating but who also developed acute onset syncope and found to have hemoglobin drop and elevated BUN and subsequent melena development while in the emergency department.  We took her for an upper endoscopy to evaluate potential symptoms and found some gastrojejunal anastomosis inflammation and some jejunal ulcers.  No active bleeding was occurring that required therapy.  Today, the patient returns for follow-up and she is accompanied by her husband.  She continues to have issues of sensation of fatigue and infrequent presyncope.  However her hemoglobin had continued to increase.  She still had evidence of anemia when her labs were last checked however.  She is not passing dark stools.  She is overdue for colon cancer screening by a year.  She is not experiencing other major symptoms at this time and she is not taking any NSAIDs or BC/Goody powders.  GI Review of Systems Positive as above with bloating at  times Negative for pyrosis, dysphagia, odynophagia, nausea, vomiting, alteration of bowel habits, melena, hematochezia  Review of Systems General: Denies fevers/chills/weight loss unintentionally Cardiovascular: Denies chest pain Pulmonary: Denies shortness of breath Gastroenterological: See HPI Genitourinary: Denies darkened urine Hematological: Positive for history of easy bruising/bleeding due to Plavix Dermatological: Denies jaundice Psychological: Mood is stable   Medications Current Outpatient Medications  Medication Sig Dispense Refill   clopidogrel (PLAVIX) 75 MG tablet TAKE 1 TABLET BY MOUTH EVERY DAY (Patient taking differently: Take 75 mg by mouth daily.) 90 tablet 3   losartan-hydrochlorothiazide (HYZAAR) 100-12.5 MG tablet Take 0.5 tablets by mouth daily. 30 tablet 11   Na Sulfate-K Sulfate-Mg Sulf (SUPREP BOWEL PREP KIT) 17.5-3.13-1.6 GM/177ML SOLN Take 1 kit by mouth as directed. For colonoscopy prep 354 mL 0   nitroGLYCERIN (NITROSTAT) 0.4 MG SL tablet Place 1 tablet (0.4 mg total) under the tongue every 5 (five) minutes as needed. 25 tablet 2   propranolol (INDERAL) 10 MG tablet May take  1 to 2  tablets a day  as needed for palpiptation 30 tablet 11   rosuvastatin (CRESTOR) 40 MG tablet Take 40 mg by mouth daily.     sertraline (ZOLOFT) 50 MG tablet Take 50 mg by mouth in the morning and at bedtime.     sucralfate (CARAFATE) 1 g tablet Take 1 tablet (1 g total) by mouth 4 (four) times daily -  with meals and at bedtime. 120 tablet 1   ferrous sulfate 325 (65 FE) MG tablet Take 1 tablet (325 mg total) by mouth daily with breakfast. 30 tablet 2   pantoprazole (PROTONIX) 40 MG tablet Take 1 tablet (40  mg total) by mouth 2 (two) times daily. 60 tablet 3   No current facility-administered medications for this visit.    Allergies Allergies  Allergen Reactions   Ceclor [Cefaclor] Hives and Rash   Tetracyclines & Related Hives   Lipitor [Atorvastatin Calcium] Cough    Penicillins Swelling and Other (See Comments)    Has patient had a PCN reaction causing immediate rash, facial/tongue/throat swelling, SOB or lightheadedness with hypotension: No Has patient had a PCN reaction causing severe rash involving mucus membranes or skin necrosis: No Has patient had a PCN reaction that required hospitalization: Yes Has patient had a PCN reaction occurring within the last 10 years: No If all of the above answers are "NO", then may proceed with Cephalosporin use.   Amoxicillin Rash   Ampicillin Rash   Lisinopril Cough   Simvastatin Cough    Histories Past Medical History:  Diagnosis Date   Anxiety    Arnold-Chiari malformation ([place]) 2006   CAD S/P PCI 05/06/2019   05/06/2019: prox LAD (@ SPI) PCI with Resolute Onyx DES 3.5  x 8, Residual 60% PDA, Normal LVF; 07/02/2019: relook Cath for Anterior Ischemia on [place] --> CATH 70% pre-stent/proximal edge dissection --> proximal overlapping DES (Resolute Onyx 3.5 x 8 - new & old stent post-dilated to 3.8 mm)   Cervical strain    syndrome   Cervicogenic headache    Concussion with loss of consciousness 10/09/2017   CTS (carpal tunnel syndrome)    Depression    GERD (gastroesophageal reflux disease)    Glucose intolerance (impaired glucose tolerance)    High coronary artery calcium score    Score of 1460.  Coronary CTA shows high-grade proximal-mid LAD stenosis (CT FFR 0.50).  Also PDA/PLA 60 to 70% stenosis (CTO FFR 0.75)   History of kidney stones    Hypertension    Migraine headache    with visual aura   Obesity    OSA (obstructive sleep apnea)    not treated   Postmenopausal    Vertigo    post concussive   Past Surgical History:  Procedure Laterality Date   BIOPSY  07/15/2021   Procedure: BIOPSY;  Surgeon: Rush Landmark Telford Nab., MD;  Location: [facility];  Service: Gastroenterology;;   Lillard Anes     right great toe   CHOLECYSTECTOMY N/A 06/05/2018   Procedure: LAPAROSCOPIC CHOLECYSTECTOMY  WITH POSSIBLE INTRAOPERATIVE CHOLANGIOGRAM;  Surgeon: Erroll Luna, MD;  Location: [facility];  Service: General;  Laterality: N/A;   CORONARY STENT INTERVENTION N/A 05/12/2019   Procedure: CORONARY STENT INTERVENTION;  Surgeon: Leonie Man, MD;  Location: [facility];  Service: Cardiovascular;;; Culprit-p-mLAD 95% 950% SP1) --> Scoring Balloon PTCA -> DES PCI (Resolute DES 3.5 x 38 - 3.8 mm; 0% LAD& 55% SP1). -->  SP1 was somewhat jailed with TIMI II flow post PCI.   CORONARY STENT INTERVENTION N/A 07/02/2019   Procedure: CORONARY STENT INTERVENTION;  Surgeon: Leonie Man, MD;  Location: [facility];  Service: Cardiovascular;; * 70% Proximal stent edge dissection (edge of previous stent that is patent) -->  successfully covered with additional RESOLUTE ONYX DES 3.5 mm x 8 mm overlapping proximal edge of previous stent (postdilated to 3.8 mm)   ENTEROSCOPY N/A 07/15/2021   Procedure: ENTEROSCOPY;  Surgeon: Rush Landmark Telford Nab., MD;  Location: [facility];  Service: Gastroenterology;  Laterality: N/A;   GASTRIC BYPASS  11/2015   [facility]   GYNECOLOGIC CRYOSURGERY     HAND SURGERY  LEFT HEART CATH AND CORONARY ANGIOGRAPHY N/A 05/12/2019   Procedure: LEFT HEART CATH AND CORONARY ANGIOGRAPHY;  Surgeon: Leonie Man, MD;  Location: [facility];  Service: Cardiovascular;;; Culprit-p-mLAD 95% 950% SP1) --> DES PCI.;  RPAV 60% -small caliber vessel, did not appear flow-limiting (medical management).  EF 55 to 60%.  Normal LVEDP.   LEFT HEART CATH AND CORONARY ANGIOGRAPHY N/A 07/02/2019   Procedure: LEFT HEART CATH AND CORONARY ANGIOGRAPHY;  Surgeon: Leonie Man, MD;  Location: [facility];  Service: Cardiovascular;; CULPRIT = ~70% proximal edge dissection of previous stent (DES PCI overlapping stent placed) - jails SP1 w/ ~60-70% ostial stenosis.[name] mLAD 25%. RI 30%, RPAV 60%.   NM MYOVIEW LTD  06/24/2019   Carlton Adam): HIGH RISK.  EF 55%.  Large size, moderate  severe defect in the anterior wall with associated anterior hypokinesis.  Suspect LAD territory.   NM MYOVIEW LTD  10/23/2019   Normal EF 55 to 60%.  No EKG changes.  Small size mild severity fixed defect in the apical wall consistent with breast attenuation versus small prior infarct.  Marked improvement from anterior apical perfusion abnormality seen in February 2020.   TRANSTHORACIC ECHOCARDIOGRAM  12/08/2020   Normal LV Fxn - EF 60-65%. No RWMA. Mod Asymmetric Basal Septa LVH with mild Concentric LVH. Gr II DD. Normal Longitudinal Strain. Normal RV size & fxn - normal RAP/CVP.  Normal MV. Mild AoV sclerosis w/ stenosis. Mild Ascending Ao dilation - ~38 mm (borderline).  => No change from 04/2017.   TUBAL LIGATION Bilateral    VAGINAL HYSTERECTOMY  2003   with LSO and right salpingectomy; right ovary still present   Z[facility] MONITOR-14-day  01/2021   Predominant rhythm sinus-heart rate range 40 to 124 bpm.  Average 70 bpm.  Rare PVCs and isolated PACs noted.  PAC couplets and triplets also noted as well as trigeminy.  3 atrial runs of 4-5 beats ranging from 102 to 150 bpm.  (2 of 3 noted on patient trigger.  Remainder patient triggers were sinus rhythm with PACs or PVCs); no atrial fibrillation or other sustained arrhythmias noted.   Social History   Socioeconomic History   Marital status: Married    Spouse name: Jaycelynn Knickerbocker   Number of children: 2   Years of education: 12   Highest education level: High school graduate  Occupational History   Occupation: Retired Best boy: [employer]    Comment: Logistics, warehouse  Tobacco Use   Smoking status: Former    Packs/day: 0.10    Years: 30.00    Pack years: 3.00    Types: Cigarettes    Quit date: 04/26/2012    Years since quitting: 9.3   Smokeless tobacco: Never  Vaping Use   Vaping Use: Never used  Substance and Sexual Activity   Alcohol use: Yes    Comment: occasional   Drug use: No   Sexual activity: Yes     Partners: Male    Birth control/protection: Surgical    Comment: hysterectomy  Other Topics Concern   Not on file  Social History Narrative   ** Merged History Encounter **       Lives with husband [name]). She travels very frequently with her job and is gone most weeks out of the month. She is active and exercises when able.   Social Determinants of Health   Financial Resource Strain: Not on file  Food Insecurity: Not on file  Transportation Needs: Not on file  Physical Activity: Not on file  Stress: Not on file  Social Connections: Not on file  Intimate Partner Violence: Not on file   Family History  Problem Relation Age of Onset   Hypertension Mother    Lung cancer Father    Alzheimer's disease Father    Migraines Father    Hyperlipidemia Sister    Hypertension Sister    Colon cancer Maternal Grandmother    Heart failure Maternal Grandfather    Alzheimer's disease Paternal Grandmother    Lung cancer Paternal Grandfather    Esophageal cancer Neg Hx    Inflammatory bowel disease Neg Hx    Liver disease Neg Hx    Pancreatic cancer Neg Hx    Rectal cancer Neg Hx    Stomach cancer Neg Hx    I have reviewed her medical, social, and family history in detail and updated the electronic medical record as necessary.    PHYSICAL EXAMINATION  BP (!) 162/90    Pulse (!) 59    Ht '5\' 7"'  (1.702 m)    Wt 201 lb (91.2 kg)    LMP 07/24/2003 (Within Months)    BMI 31.48 kg/m  Wt Readings from Last 3 Encounters:  09/06/21 201 lb (91.2 kg)  08/21/21 201 lb (91.2 kg)  07/16/21 202 lb 1.6 oz (91.7 kg)  GEN: NAD, appears stated age, doesn't appear chronically ill, accompanied by husband PSYCH: Cooperative, without pressured speech EYE: Conjunctivae pink, sclerae anicteric ENT: MMM CV: Nontachycardic RESP: No audible wheezing GI: NABS, soft, rounded, obese, NT/ND, without rebound MSK/EXT: No lower extremity edema SKIN: No jaundice NEURO:  Alert & Oriented x 3, no focal  deficits   REVIEW OF DATA  I reviewed the following data at the time of this encounter:  GI Procedures and Studies  December 2022 enteroscopy - No gross lesions in esophagus proximally. Salmon-colored mucosa suspicious for Barrett's esophagus distally - biopsied. - Z-line irregular, 35 cm from the incisors. - Roux-en-Y gastrojejunostomy with gastrojejunal anastomosis characterized by slight congestion, erosion, inflammation. Gastric pouch with a staple that was removed. - 2 non-bleeding jejunal ulcers with a clean ulcer base (Forrest Class III) were found just distal to the anastomosis within the jejunum (largest was 24 mm in size). - Normal mucosa was found in the visualized jejunal limbs.  Pathology FINAL MICROSCOPIC DIAGNOSIS:  A. STOMACH, BIOPSY:  - Gastric oxyntic mucosa with no significant histopathologic change.  - Negative for H. pylori, dysplasia, and malignancy.  B. ESOPHAGUS, BIOPSY:  - Squamocolumnar mucosa with intestinal metaplasia.  - Negative for dysplasia and malignancy.  - See comment.   Laboratory Studies  Reviewed those in epic  Imaging Studies  No new imaging studies to review   ASSESSMENT  Ms. Richner is a 66 y.o. female with a pmh significant for CAD (status post PCI on Plavix), Arnold-Chiari malformation of brain, MDD, anxiety, nephrolithiasis, hypertension, obesity, OSA, status post cholecystectomy, GERD, Barrett's esophagus, status post gastric bypass with gastrojejunal anastomosis ulcer, jejunal ulcers.  The patient is seen today for evaluation and management of:  1. Gastrojejunal ulceration   2. Ulcer of small intestine   3. Acute blood loss anemia   4. Iron deficiency   5. Bloating symptom   6. Colon cancer screening   7. Long term (current) use of antithrombotics/antiplatelets    The patient is clinically and hemodynamically stable at this time.  She seems to be doing well on her PPI therapy and Carafate therapy.  We will decrease her Carafate to  twice daily therapy.  Her blood counts have been increasing appropriately, though slowly.  Suspect that this will continue to be improving and we will plan to recheck our blood counts today and iron indices.  We will plan to perform her upper endoscopy for surveillance and healing check in the next few weeks.  She is also due for colon cancer screening and we will perform colonoscopy at the same time.  We will get approval for Plavix hold from her cardiology team as well.  The risks and benefits of endoscopic evaluation were discussed with the patient; these include but are not limited to the risk of perforation, infection, bleeding, missed lesions, lack of diagnosis, severe illness requiring hospitalization, as well as anesthesia and sedation related illnesses.  The patient and/or family is agreeable to proceed.  All patient questions were answered to the best of my ability, and the patient agrees to the aforementioned plan of action with follow-up as indicated.   PLAN  Laboratories as outlined below Continue PPI twice daily Continue Carafate twice daily Surveillance endoscopy to be scheduled Screening colonoscopy to be scheduled at same time Continue oral iron daily for now   Orders Placed This Encounter  Procedures   CBC   Comp Met (CMET)   IBC + Ferritin   Reticulocytes   B12   Folate   Ambulatory referral to Gastroenterology    New Prescriptions   NA SULFATE-K SULFATE-MG SULF (SUPREP BOWEL PREP KIT) 17.5-3.13-1.6 GM/177ML SOLN    Take 1 kit by mouth as directed. For colonoscopy prep   Modified Medications   Modified Medication Previous Medication   FERROUS SULFATE 325 (65 FE) MG TABLET ferrous sulfate 325 (65 FE) MG tablet      Take 1 tablet (325 mg total) by mouth daily with breakfast.    Take 1 tablet (325 mg total) by mouth daily with breakfast.    Planned Follow Up No follow-ups on file.   Total Time in Face-to-Face and in Coordination of Care for patient including  independent/personal interpretation/review of prior testing, medical history, examination, medication adjustment, communicating results with the patient directly, and documentation within the EHR is 25 minutes.   Justice Britain, MD [facility] Advanced [facility]oscopy Office # [phone]

## 2021-09-06 NOTE — Patient Instructions (Signed)
Your provider has requested that you go to the basement level for lab work before leaving today. Press "B" on the elevator. The lab is located at the first door on the left as you exit the elevator.  You have been scheduled for an endoscopy and colonoscopy. Please follow the written instructions given to you at your visit today. Please pick up your prep supplies at the pharmacy within the next 1-3 days. If you use inhalers (even only as needed), please bring them with you on the day of your procedure.  We have sent the following medications to your pharmacy for you to pick up at your convenience: Suprep   Decrease your Carafate to twice daily.   If you are age 56 or older, your body mass index should be between 23-30. Your Body mass index is 31.48 kg/m. If this is out of the aforementioned range listed, please consider follow up with your Primary Care Provider.  If you are age 65 or younger, your body mass index should be between 19-25. Your Body mass index is 31.48 kg/m. If this is out of the aformentioned range listed, please consider follow up with your Primary Care Provider.   ________________________________________________________  The [facility] providers would like to encourage you to use [facility] to communicate with providers for non-urgent requests or questions.  Due to long hold times on the telephone, sending your provider a message by [facility] may be a faster and more efficient way to get a response.  Please allow 48 business hours for a response.  Please remember that this is for non-urgent requests.  _______________________________________________________  Thank you for choosing me and [facility].  Dr. Rush Landmark

## 2021-09-07 ENCOUNTER — Telehealth: Payer: Self-pay

## 2021-09-07 ENCOUNTER — Other Ambulatory Visit: Payer: Self-pay

## 2021-09-07 DIAGNOSIS — D62 Acute posthemorrhagic anemia: Secondary | ICD-10-CM

## 2021-09-07 LAB — COMPREHENSIVE METABOLIC PANEL
ALT: 14 U/L (ref 0–35)
AST: 15 U/L (ref 0–37)
Albumin: 4.6 g/dL (ref 3.5–5.2)
Alkaline Phosphatase: 62 U/L (ref 39–117)
BUN: 20 mg/dL (ref 6–23)
CO2: 25 mEq/L (ref 19–32)
Calcium: 9.7 mg/dL (ref 8.4–10.5)
Chloride: 105 mEq/L (ref 96–112)
Creatinine, Ser: 0.95 mg/dL (ref 0.40–1.20)
GFR: 63.45 mL/min (ref >60.00)
Glucose, Bld: 89 mg/dL (ref 70–99)
Potassium: 4 mEq/L (ref 3.5–5.1)
Sodium: 142 mEq/L (ref 135–145)
Total Bilirubin: 0.6 mg/dL (ref 0.2–1.2)
Total Protein: 8 g/dL (ref 6.0–8.3)

## 2021-09-07 LAB — IBC + FERRITIN
Ferritin: 7.7 ng/mL — ABNORMAL LOW (ref 10.0–291.0)
Iron: 49 ug/dL (ref 42–145)
Saturation Ratios: 12.2 % — ABNORMAL LOW (ref 20.0–50.0)
TIBC: 401.8 ug/dL (ref 250.0–450.0)
Transferrin: 287 mg/dL (ref 212.0–360.0)

## 2021-09-07 MED ORDER — FERROUS SULFATE 325 (65 FE) MG PO TABS: 325.0000 mg | ORAL_TABLET | Freq: Every day | ORAL | 2 refills | Status: DC

## 2021-09-07 NOTE — Telephone Encounter (Signed)
Dr Rush Landmark please advise on 5 or 7 day hold for plavix?   Per Dr Ellyn Hack   On maintenance dose Plavix which was restarted post hospital. Okay to hold Plavix 5 to 7 days preop for surgeries or procedures.  Would hold for 7 days prior to upcoming GI procedure. Also okay to hold Plavix 3 to 4 days at the sign of bleeding (GI bleed, nosebleed, hematuria or significant bruising).

## 2021-09-07 NOTE — Telephone Encounter (Addendum)
Patient returned call.She has been informed to hold Plavix for 5 days prior to procedure. Voiced understanding.   Patient also states that she will need refill on Ferrous Sulfate -per pt she does not have any further refills. Refills sent to pharmacy.   Patient also needed to r/s colon/egd due to her husband's schedule. Pt has been r/s to 10/24/21. New instructions will be sent to patient via my chart.

## 2021-09-07 NOTE — Telephone Encounter (Signed)
Perfect. I am OK with typical 5-day hold. GM

## 2021-09-07 NOTE — Addendum Note (Signed)
Addended byDebbe Mounts on: 09/07/2021 10:24 AM   Modules accepted: Orders

## 2021-09-07 NOTE — Telephone Encounter (Signed)
Left message for pt,asking for return call.

## 2021-09-08 DIAGNOSIS — R140 Abdominal distension (gaseous): Secondary | ICD-10-CM | POA: Insufficient documentation

## 2021-09-14 ENCOUNTER — Ambulatory Visit: Payer: Medicare Other | Admitting: Physician Assistant

## 2021-09-21 ENCOUNTER — Other Ambulatory Visit: Payer: Self-pay | Admitting: Gastroenterology

## 2021-09-22 ENCOUNTER — Ambulatory Visit: Payer: Medicare Other | Admitting: Gastroenterology

## 2021-09-25 ENCOUNTER — Other Ambulatory Visit: Payer: Self-pay | Admitting: Internal Medicine

## 2021-10-03 ENCOUNTER — Encounter: Payer: 59 | Admitting: Gastroenterology

## 2021-10-24 ENCOUNTER — Encounter: Payer: Self-pay | Admitting: Gastroenterology

## 2021-10-24 ENCOUNTER — Ambulatory Visit (AMBULATORY_SURGERY_CENTER): Payer: Medicare Other | Admitting: Gastroenterology

## 2021-10-24 VITALS — BP 136/76 | HR 57 | Temp 98.6°F | Resp 14 | Ht 67.0 in | Wt 201.0 lb

## 2021-10-24 DIAGNOSIS — K289 Gastrojejunal ulcer, unspecified as acute or chronic, without hemorrhage or perforation: Secondary | ICD-10-CM

## 2021-10-24 DIAGNOSIS — D62 Acute posthemorrhagic anemia: Secondary | ICD-10-CM

## 2021-10-24 DIAGNOSIS — K2960 Other gastritis without bleeding: Secondary | ICD-10-CM

## 2021-10-24 DIAGNOSIS — K31A0 Gastric intestinal metaplasia, unspecified: Secondary | ICD-10-CM | POA: Diagnosis not present

## 2021-10-24 DIAGNOSIS — D125 Benign neoplasm of sigmoid colon: Secondary | ICD-10-CM

## 2021-10-24 DIAGNOSIS — K2100 Gastro-esophageal reflux disease with esophagitis, without bleeding: Secondary | ICD-10-CM | POA: Diagnosis not present

## 2021-10-24 DIAGNOSIS — D127 Benign neoplasm of rectosigmoid junction: Secondary | ICD-10-CM

## 2021-10-24 DIAGNOSIS — K635 Polyp of colon: Secondary | ICD-10-CM | POA: Diagnosis not present

## 2021-10-24 DIAGNOSIS — D123 Benign neoplasm of transverse colon: Secondary | ICD-10-CM

## 2021-10-24 DIAGNOSIS — Z1211 Encounter for screening for malignant neoplasm of colon: Secondary | ICD-10-CM

## 2021-10-24 DIAGNOSIS — D122 Benign neoplasm of ascending colon: Secondary | ICD-10-CM

## 2021-10-24 DIAGNOSIS — D124 Benign neoplasm of descending colon: Secondary | ICD-10-CM

## 2021-10-24 MED ORDER — SODIUM CHLORIDE 0.9 % IV SOLN: 500.0000 mL | Freq: Once | INTRAVENOUS | Status: DC

## 2021-10-24 NOTE — Progress Notes (Signed)
Pt non-responsive, VVS, Report to RN  °

## 2021-10-24 NOTE — Progress Notes (Signed)
Called to room to assist during endoscopic procedure.  Patient ID and intended procedure confirmed with present staff. Received instructions for my participation in the procedure from the performing physician.  

## 2021-10-24 NOTE — Progress Notes (Signed)
VS completed by CW. ? ?Medical history reviewed and updated. ? ? ?

## 2021-10-24 NOTE — Progress Notes (Signed)
? ?GASTROENTEROLOGY PROCEDURE H&P NOTE  ? ?Primary Care Physician: ?Leonard Downing, MD ? ?HPI: ?Carrie Reynolds is a 66 y.o. 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 ? ?Past Medical History:  ?Diagnosis Date  ? Anxiety   ? Arnold-Chiari malformation ([place]) 2006  ? CAD S/P PCI 05/06/2019  ? 05/06/2019: prox LAD (@ SPI) PCI with Resolute Onyx DES 3.5  x 8, Residual 60% PDA, Normal LVF; 07/02/2019: relook Cath for Anterior Ischemia on [place] --> CATH 70% pre-stent/proximal edge dissection --> proximal overlapping DES (Resolute Onyx 3.5 x 8 - new & old stent post-dilated to 3.8 mm)  ? Cervical strain   ? syndrome  ? Cervicogenic headache   ? Chronic kidney disease   ? Concussion with loss of consciousness 10/09/2017  ? CTS (carpal tunnel syndrome)   ? Depression   ? GERD (gastroesophageal reflux disease)   ? Glucose intolerance (impaired glucose tolerance)   ? High coronary artery calcium score   ? Score of 1460.  Coronary CTA shows high-grade proximal-mid LAD stenosis (CT FFR 0.50).  Also PDA/PLA 60 to 70% stenosis (CTO FFR 0.75)  ? History of kidney stones   ? Hypertension   ? Migraine headache   ? with visual aura  ? Obesity   ? OSA (obstructive sleep apnea)   ? not treated  ? Postmenopausal   ? Sleep apnea   ? Vertigo   ? post concussive  ? ?Past Surgical History:  ?Procedure Laterality Date  ? BIOPSY  07/15/2021  ? Procedure: BIOPSY;  Surgeon: Irving Copas., MD;  Location: [place];  Service: Gastroenterology;;  ? Lillard Anes    ? right great toe  ? CHOLECYSTECTOMY N/A 06/05/2018  ? Procedure: LAPAROSCOPIC CHOLECYSTECTOMY WITH POSSIBLE INTRAOPERATIVE CHOLANGIOGRAM;  Surgeon: Erroll Luna, MD;  Location: [place];  Service: General;  Laterality: N/A;  ? COLOSTOMY    ? CORONARY STENT INTERVENTION N/A 05/12/2019  ? Procedure: CORONARY STENT INTERVENTION;  Surgeon: Leonie Man, MD;  Location: [facility] [facility];  Service: Cardiovascular;;; Culprit-p-mLAD 95% 950% SP1) --> Scoring Balloon PTCA -> DES PCI (Resolute DES 3.5 x 38 - 3.8 mm; 0% LAD& 55% SP1). -->  SP1 was somewhat jailed with TIMI II flow post PCI.  ? CORONARY STENT INTERVENTION N/A 07/02/2019  ? Procedure: CORONARY STENT INTERVENTION;  Surgeon: Leonie Man, MD;  Location: [place] [facility];  Service: Cardiovascular;; * 70% Proximal stent edge dissection (edge of previous stent that is patent) -->  successfully covered with additional RESOLUTE ONYX DES 3.5 mm x 8 mm overlapping proximal edge of previous stent (postdilated to 3.8 mm)  ? ENTEROSCOPY N/A 07/15/2021  ? Procedure: ENTEROSCOPY;  Surgeon: Rush Landmark Telford Nab., MD;  Location: [place];  Service: Gastroenterology;  Laterality: N/A;  ? GASTRIC BYPASS  11/2015  ? [facility]  ? GYNECOLOGIC CRYOSURGERY    ? HAND SURGERY    ? LEFT HEART CATH AND CORONARY ANGIOGRAPHY N/A 05/12/2019  ? Procedure: LEFT HEART CATH AND CORONARY ANGIOGRAPHY;  Surgeon: Leonie Man, MD;  Location: [place] [facility];  Service: Cardiovascular;;; Culprit-p-mLAD 95% 950% SP1) --> DES PCI.;  RPAV 60% -small caliber vessel, did not appear flow-limiting (medical management).  EF 55 to 60%.  Normal LVEDP.  ? LEFT HEART CATH AND CORONARY ANGIOGRAPHY N/A 07/02/2019  ? Procedure: LEFT HEART CATH AND CORONARY ANGIOGRAPHY;  Surgeon: Leonie Man, MD;  Location: [place] [facility];  Service: Cardiovascular;; CULPRIT = ~70% proximal edge dissection of previous stent (DES PCI overlapping stent placed) - jails SP1 w/ ~60-70% ostial stenosis.[name] mLAD 25%. RI 30%, RPAV 60%.  ? NM MYOVIEW LTD  06/24/2019  ? Carlton Adam): HIGH RISK.  EF 55%.  Large size, moderate severe defect in the anterior wall with associated anterior hypokinesis.  Suspect LAD territory.  ? NM MYOVIEW LTD  10/23/2019  ? Normal EF 55 to 60%.  No EKG changes.  Small size mild severity fixed defect in the apical wall consistent with breast attenuation versus small prior  infarct.  Marked improvement from anterior apical perfusion abnormality seen in February 2020.  ? TRANSTHORACIC ECHOCARDIOGRAM  12/08/2020  ? Normal LV Fxn - EF 60-65%. No RWMA. Mod Asymmetric Basal Septa LVH with mild Concentric LVH. Gr II DD. Normal Longitudinal Strain. Normal RV size & fxn - normal RAP/CVP.  Normal MV. Mild AoV sclerosis w/ stenosis. Mild Ascending Ao dilation - ~38 mm (borderline).  => No change from 04/2017.  ? TUBAL LIGATION Bilateral   ? UPPER GASTROINTESTINAL ENDOSCOPY    ? VAGINAL HYSTERECTOMY  2003  ? with LSO and right salpingectomy; right ovary still present  ? ZIO PATCH MONITOR-14-day  01/2021  ? Predominant rhythm sinus-heart rate range 40 to 124 bpm.  Average 70 bpm.  Rare PVCs and isolated PACs noted.  PAC couplets and triplets also noted as well as trigeminy.  3 atrial runs of 4-5 beats ranging from 102 to 150 bpm.  (2 of 3 noted on patient trigger.  Remainder patient triggers were sinus rhythm with PACs or PVCs); no atrial fibrillation or other sustained arrhythmias noted.  ? ?Current Outpatient Medications  ?Medication Sig Dispense Refill  ? losartan-hydrochlorothiazide (HYZAAR) 100-12.5 MG tablet Take 0.5 tablets by mouth daily. 30 tablet 11  ? rosuvastatin (CRESTOR) 40 MG tablet Take 40 mg by mouth daily.    ? sertraline (ZOLOFT) 50 MG tablet Take 50 mg by mouth in the morning and at bedtime.    ? clopidogrel (PLAVIX) 75 MG tablet TAKE 1 TABLET BY MOUTH EVERY DAY (Patient taking differently: Take 75 mg by mouth daily.) 90 tablet 3  ? ferrous sulfate 325 (65 FE) MG tablet Take 1 tablet (325 mg total) by mouth daily with breakfast. 30 tablet 2  ? nitroGLYCERIN (NITROSTAT) 0.4 MG SL tablet Place 1 tablet (0.4 mg total) under the tongue every 5 (five) minutes as needed. 25 tablet 2  ? pantoprazole (PROTONIX) 40 MG tablet Take 1 tablet (40 mg total) by mouth 2 (two) times daily. 60 tablet 3  ? propranolol (INDERAL) 10 MG tablet May take  1 to 2  tablets a day  as needed for  palpiptation 30 tablet 11  ? sucralfate (CARAFATE) 1 g tablet TAKE 1 TABLET (1 G TOTAL) BY MOUTH 4 TIMES A DAY WITH MEALS AND AT BEDTIME 120 tablet 1  ? ?Current Facility-Administered Medications  ?Medication Dose Route Frequency Provider Last Rate Last Admin  ? 0.9 %  sodium chloride infusion  500 mL Intravenous Once Mansouraty, Telford Nab., MD      ? ? ?Current Outpatient Medications:  ?  losartan-hydrochlorothiazide (HYZAAR) 100-12.5 MG tablet, Take 0.5 tablets by mouth daily., Disp: 30 tablet, Rfl: 11 ?  rosuvastatin (CRESTOR) 40 MG tablet, Take 40 mg by mouth daily., Disp: , Rfl:  ?  sertraline (ZOLOFT) 50 MG tablet, Take 50 mg by mouth in the morning and at bedtime., Disp: , Rfl:  ?  clopidogrel (PLAVIX) 75 MG  tablet, TAKE 1 TABLET BY MOUTH EVERY DAY (Patient taking differently: Take 75 mg by mouth daily.), Disp: 90 tablet, Rfl: 3 ?  ferrous sulfate 325 (65 FE) MG tablet, Take 1 tablet (325 mg total) by mouth daily with breakfast., Disp: 30 tablet, Rfl: 2 ?  nitroGLYCERIN (NITROSTAT) 0.4 MG SL tablet, Place 1 tablet (0.4 mg total) under the tongue every 5 (five) minutes as needed., Disp: 25 tablet, Rfl: 2 ?  pantoprazole (PROTONIX) 40 MG tablet, Take 1 tablet (40 mg total) by mouth 2 (two) times daily., Disp: 60 tablet, Rfl: 3 ?  propranolol (INDERAL) 10 MG tablet, May take  1 to 2  tablets a day  as needed for palpiptation, Disp: 30 tablet, Rfl: 11 ?  sucralfate (CARAFATE) 1 g tablet, TAKE 1 TABLET (1 G TOTAL) BY MOUTH 4 TIMES A DAY WITH MEALS AND AT BEDTIME, Disp: 120 tablet, Rfl: 1 ? ?Current Facility-Administered Medications:  ?  0.9 %  sodium chloride infusion, 500 mL, Intravenous, Once, Mansouraty, Telford Nab., MD ?Allergies  ?Allergen Reactions  ? Ceclor [Cefaclor] Hives and Rash  ? Tetracyclines & Related Hives  ? Lipitor [Atorvastatin Calcium] Cough  ? Penicillins Swelling and Other (See Comments)  ?  Has patient had a PCN reaction causing immediate rash, facial/tongue/throat swelling, SOB or  lightheadedness with hypotension: No ?Has patient had a PCN reaction causing severe rash involving mucus membranes or skin necrosis: No ?Has patient had a PCN reaction that required hospitalization: Yes ?Has patient ha

## 2021-10-24 NOTE — Patient Instructions (Signed)
Please read handouts provided. ?Continue present medications. ?High Fiber Diet. ?Use FiberCon 1-2 tablets daily. ?Restart Plavix in 72 hours ( 4/7 ) . ?No NSAIDS. ?Repeat colonoscopy in 1 year. ?Clip card. ?May decrease Carafate twice daily for next month, then stop if tolerate. ?Repeat upper endoscopy in 3 years. ?Continue Protonix twice daily. ? ? ?YOU HAD AN ENDOSCOPIC PROCEDURE TODAY AT [facility]:   Refer to the procedure report that was given to you for any specific questions about what was found during the examination.  If the procedure report does not answer your questions, please call your gastroenterologist to clarify.  If you requested that your care partner not be given the details of your procedure findings, then the procedure report has been included in a sealed envelope for you to review at your convenience later. ? ?YOU SHOULD EXPECT: Some feelings of bloating in the abdomen. Passage of more gas than usual.  Walking can help get rid of the air that was put into your GI tract during the procedure and reduce the bloating. If you had a lower endoscopy (such as a colonoscopy or flexible sigmoidoscopy) you may notice spotting of blood in your stool or on the toilet paper. If you underwent a bowel prep for your procedure, you may not have a normal bowel movement for a few days. ? ?Please Note:  You might notice some irritation and congestion in your nose or some drainage.  This is from the oxygen used during your procedure.  There is no need for concern and it should clear up in a day or so. ? ?SYMPTOMS TO REPORT IMMEDIATELY: ? ?Following lower endoscopy (colonoscopy or flexible sigmoidoscopy): ? Excessive amounts of blood in the stool ? Significant tenderness or worsening of abdominal pains ? Swelling of the abdomen that is new, acute ? Fever of 100?F or higher ? ?Following upper endoscopy (EGD) ? Vomiting of blood or coffee ground material ? New chest pain or pain under the shoulder  blades ? Painful or persistently difficult swallowing ? New shortness of breath ? Fever of 100?F or higher ? Black, tarry-looking stools ? ?For urgent or emergent issues, a gastroenterologist can be reached at any hour by calling [phone]. ?Do not use MyChart messaging for urgent concerns.  ? ? ?DIET:  We do recommend a small meal at first, but then you may proceed to your regular diet.  Drink plenty of fluids but you should avoid alcoholic beverages for 24 hours. ? ?ACTIVITY:  You should plan to take it easy for the rest of today and you should NOT DRIVE or use heavy machinery until tomorrow (because of the sedation medicines used during the test).   ? ?FOLLOW UP: ?Our staff will call the number listed on your records 48-72 hours following your procedure to check on you and address any questions or concerns that you may have regarding the information given to you following your procedure. If we do not reach you, we will leave a message.  We will attempt to reach you two times.  During this call, we will ask if you have developed any symptoms of COVID 19. If you develop any symptoms (ie: fever, flu-like symptoms, shortness of breath, cough etc.) before then, please call [phone].  If you test positive for Covid 19 in the 2 weeks post procedure, please call and report this information to [place].   ? ?If any biopsies were taken you will be contacted by phone or by letter within the  next 1-3 weeks.  Please call us at [phone] if you have not heard about the biopsies in 3 weeks.  ? ? ?SIGNATURES/CONFIDENTIALITY: ?You and/or your care partner have signed paperwork which will be entered into your electronic medical record.  These signatures attest to the fact that that the information above on your After Visit Summary has been reviewed and is understood.  Full responsibility of the confidentiality of this discharge information lies with you and/or your care-partner.  ?

## 2021-10-24 NOTE — Op Note (Signed)
[place] ?Patient Name: Carrie Reynolds ?Procedure Date: 10/24/2021 7:24 AM ?MRN: [id number] ?Endoscopist: Justice Britain , MD ?Age: 66 ?Referring MD:  ?Date of Birth: 1956/02/22 ?Gender: Female ?Account #: [id number] ?Procedure:                Upper GI endoscopy ?Indications:              Follow-up of Barrett's esophagus, Follow-up of  ?                          gastrojejunal ulcer with hemorrhage ?Medicines:                Monitored Anesthesia Care ?Procedure:                Pre-Anesthesia Assessment: ?                          - Prior to the procedure, a History and Physical  ?                          was performed, and patient medications and  ?                          allergies were reviewed. The patient's tolerance of  ?                          previous anesthesia was also reviewed. The risks  ?                          and benefits of the procedure and the sedation  ?                          options and risks were discussed with the patient.  ?                          All questions were answered, and informed consent  ?                          was obtained. Prior Anticoagulants: The patient has  ?                          taken Plavix (clopidogrel), last dose was 5 days  ?                          prior to procedure. ASA Grade Assessment: III - A  ?                          patient with severe systemic disease. After  ?                          reviewing the risks and benefits, the patient was  ?                          deemed in satisfactory condition to undergo the  ?  procedure. ?                          After obtaining informed consent, the endoscope was  ?                          passed under direct vision. Throughout the  ?                          procedure, the patient's blood pressure, pulse, and  ?                          oxygen saturations were monitored continuously. The  ?                          GIF [id number] #[id number] was introduced through the  ?                           mouth, and advanced to the afferent jejunal loop.  ?                          The upper GI endoscopy was accomplished without  ?                          difficulty. The patient tolerated the procedure. ?Scope In: ?Scope Out: ?Findings:                 No gross lesions were noted in the proximal  ?                          esophagus and in the mid esophagus. ?                          The esophagus and gastroesophageal junction were  ?                          examined with white light and narrow band imaging  ?                          (NBI) from a forward view and retroflexed position.  ?                          There were esophageal mucosal changes consistent  ?                          with short-segment Barrett's esophagus. These  ?                          changes involved the mucosa along an irregular  ?                          Z-line (40 cm from the incisors). Scattered islands  ?                          of salmon-colored mucosa were  present from 38 to 40  ?                          cm. The maximum longitudinal extent of these  ?                          esophageal mucosal changes was 2 cm in length. This  ?                          was biopsied with a cold forceps for histology. ?                          Evidence of a gastrojejunostomy was found in the  ?                          gastric body. This was characterized by healthy  ?                          appearing mucosa. The previous ulcers have healed. ?                          Normal mucosa was found in the entire examined  ?                          stomach. ?                          Normal mucosa was found in the afferent/efferent  ?                          jejunum. ?Complications:            No immediate complications. ?Estimated Blood Loss:     Estimated blood loss was minimal. ?Impression:               - No gross lesions in esophagus proximally.  ?                          Esophageal mucosal changes consistent with  ?                           short-segment Barrett's esophagus distally -  ?                          biopsied. ?                          - A gastrojejunostomy was found, characterized by  ?                          healthy appearing mucosa - the previous ulcers have  ?                          healed. ?                          -  Normal mucosa was found in the entire stomach  ?                          otherwise. ?                          - Normal mucosa was found in the afferent/efferent  ?                          jejunum. ?Recommendation:           - Proceed to scheduled colonoscopy. ?                          - Continue Protonix twice daily. ?                          - May decrease to Carafate twice daily for next  ?                          month, then off if tolerates. ?                          - Await pathology results. ?                          - Repeat upper endoscopy in 3 years for  ?                          surveillance of Barrett's esophagus unless any  ?                          dysplasia is found. ?                          - The findings and recommendations were discussed  ?                          with the patient. ?                          - The findings and recommendations were discussed  ?                          with the patient's family. ?Justice Britain, MD ?10/24/2021 8:59:14 AM ?

## 2021-10-24 NOTE — Op Note (Addendum)
[place] ?Patient Name: Carrie Reynolds ?Procedure Date: 10/24/2021 7:23 AM ?MRN: [id number] ?Endoscopist: Justice Britain , MD ?Age: 66 ?Referring MD:  ?Date of Birth: 12-15-1955 ?Gender: Female ?Account #: [id number] ?Procedure:                Colonoscopy ?Indications:              Screening for colorectal malignant neoplasm ?Medicines:                Monitored Anesthesia Care ?Procedure:                Pre-Anesthesia Assessment: ?                          - Prior to the procedure, a History and Physical  ?                          was performed, and patient medications and  ?                          allergies were reviewed. The patient's tolerance of  ?                          previous anesthesia was also reviewed. The risks  ?                          and benefits of the procedure and the sedation  ?                          options and risks were discussed with the patient.  ?                          All questions were answered, and informed consent  ?                          was obtained. Prior Anticoagulants: The patient has  ?                          taken Plavix (clopidogrel), last dose was 5 days  ?                          prior to procedure. ASA Grade Assessment: III - A  ?                          patient with severe systemic disease. After  ?                          reviewing the risks and benefits, the patient was  ?                          deemed in satisfactory condition to undergo the  ?                          procedure. ?  After obtaining informed consent, the colonoscope  ?                          was passed under direct vision. Throughout the  ?                          procedure, the patient's blood pressure, pulse, and  ?                          oxygen saturations were monitored continuously. The  ?                          Colonoscope was introduced through the anus and  ?                          advanced to the 5 cm into the ileum. The  ?                           colonoscopy was performed without difficulty. The  ?                          patient tolerated the procedure. The quality of the  ?                          bowel preparation was good. The terminal ileum,  ?                          ileocecal valve, appendiceal orifice, and rectum  ?                          were photographed. ?Scope In: 8:15:19 AM ?Scope Out: 8:48:16 AM ?Scope Withdrawal Time: 0 hours 29 minutes 17 seconds  ?Total Procedure Duration: 0 hours 32 minutes 57 seconds  ?Findings:                 The digital rectal exam findings include  ?                          hemorrhoids. Pertinent negatives include no  ?                          palpable rectal lesions. ?                          The terminal ileum and ileocecal valve appeared  ?                          normal. ?                          Six sessile polyps were found in the recto-sigmoid  ?                          colon (1), sigmoid colon (1), transverse colon (2)  ?  and ascending colon (2). The polyps were 4 to 15 mm  ?                          in size. These polyps were removed with a cold  ?                          snare. Resection and retrieval were complete. ?                          A 30 mm polyp was found in the descending colon.  ?                          The polyp was sessile and has features of a sessile  ?                          serrated adeoma. Preparations were made for mucosal  ?                          resection. NBI imaging and [place] endoscopy  ?                          was done to demarcate the borders of the lesion.  ?                          Saline was injected to raise the lesion. Piecemeal  ?                          mucosal resection using a cold snare was performed.  ?                          Resection and retrieval were complete. To prevent  ?                          bleeding after mucosal resection, two hemostatic  ?                          clips were  successfully placed (MR conditional).  ?                          There was no bleeding at the end of the procedure. ?                          Multiple small-mouthed diverticula were found in  ?                          the recto-sigmoid colon and sigmoid colon. ?                          Normal mucosa was found in the entire colon  ?                          otherwise. ?  Non-bleeding non-thrombosed external and internal  ?                          hemorrhoids were found during retroflexion, during  ?                          perianal exam and during digital exam. The  ?                          hemorrhoids were Grade II (internal hemorrhoids  ?                          that prolapse but reduce spontaneously). ?Complications:            No immediate complications. ?Estimated Blood Loss:     Estimated blood loss was minimal. ?Impression:               - Hemorrhoids found on digital rectal exam. ?                          - The examined portion of the ileum was normal. ?                          - Six 4 to 15 mm polyps at the recto-sigmoid colon,  ?                          in the sigmoid colon, in the transverse colon and  ?                          in the ascending colon, removed with a cold snare.  ?                          Resected and retrieved. ?                          - One 30 mm polyp in the descending colon, removed  ?                          with cold piecemeal mucosal resection. Resected and  ?                          retrieved. Clips (MR conditional) were placed. ?                          - Diverticulosis in the recto-sigmoid colon and in  ?                          the sigmoid colon. ?                          - Normal mucosa in the entire examined colon  ?                          otherwise. ?                          -  Non-bleeding non-thrombosed external and internal  ?                          hemorrhoids. ?Recommendation:           - The patient will be observed  post-procedure,  ?                          until all discharge criteria are met. ?                          - Discharge patient to home. ?                          - Patient has a contact number available for  ?                          emergencies. The signs and symptoms of potential  ?                          delayed complications were discussed with the  ?                          patient. Return to normal activities tomorrow.  ?                          Written discharge instructions were provided to the  ?                          patient. ?                          - High fiber diet. ?                          - Use FiberCon 1-2 tablets PO daily. ?                          - May restart Plavix in 72 hours (4/7 AM) to  ?                          decrease risk of post-interventional bleeding. ?                          - No NSAIDs (also important from upper GI tract  ?                          perspective as well). ?                          - Continue present medications. ?                          - Await pathology results. ?                          - Repeat colonoscopy in 1 year  for surveillance due  ?                          to large polyp resection. ?                          - The findings and recommendations were discussed  ?                          with the patient. ?                          - The findings and recommendations were discussed  ?                          with the patient's family. ?Justice Britain, MD ?10/24/2021 9:05:24 AM ?

## 2021-10-25 ENCOUNTER — Ambulatory Visit (INDEPENDENT_AMBULATORY_CARE_PROVIDER_SITE_OTHER): Payer: 59 | Admitting: Orthopedic Surgery

## 2021-10-25 ENCOUNTER — Ambulatory Visit: Payer: 59

## 2021-10-25 ENCOUNTER — Ambulatory Visit (INDEPENDENT_AMBULATORY_CARE_PROVIDER_SITE_OTHER): Payer: Medicare Other

## 2021-10-25 ENCOUNTER — Ambulatory Visit: Payer: Self-pay

## 2021-10-25 DIAGNOSIS — M1811 Unilateral primary osteoarthritis of first carpometacarpal joint, right hand: Secondary | ICD-10-CM

## 2021-10-25 DIAGNOSIS — M79641 Pain in right hand: Secondary | ICD-10-CM

## 2021-10-25 DIAGNOSIS — M6530 Trigger finger, unspecified finger: Secondary | ICD-10-CM

## 2021-10-25 DIAGNOSIS — M65321 Trigger finger, right index finger: Secondary | ICD-10-CM | POA: Diagnosis not present

## 2021-10-25 DIAGNOSIS — M79642 Pain in left hand: Secondary | ICD-10-CM

## 2021-10-25 DIAGNOSIS — I208 Other forms of angina pectoris: Secondary | ICD-10-CM

## 2021-10-26 ENCOUNTER — Telehealth: Payer: Self-pay

## 2021-10-26 NOTE — Telephone Encounter (Signed)
?  Follow up Call- ? ? ?  10/24/2021  ?  7:20 AM  ?Call back number  ?Post procedure Call Back phone  # [phone]  ?Permission to leave phone message Yes  ?  ? ?Patient questions: ? ?Do you have a fever, pain , or abdominal swelling? No. ?Pain Score  0 * ? ?Have you tolerated food without any problems? Yes.   ? ?Have you been able to return to your normal activities? Yes.   ? ?Do you have any questions about your discharge instructions: ?Diet   No. ?Medications  No. ?Follow up visit  No. ? ?Do you have questions or concerns about your Care? No. ? ?Actions: ?* If pain score is 4 or above: ?No action needed, pain <4. ? ? ?

## 2021-10-27 ENCOUNTER — Encounter: Payer: Self-pay | Admitting: Orthopedic Surgery

## 2021-10-27 MED ORDER — LIDOCAINE HCL 1 % IJ SOLN
3.0000 mL | INTRAMUSCULAR | Status: AC | PRN
  Administered 2021-10-25: 3 mL

## 2021-10-27 MED ORDER — METHYLPREDNISOLONE ACETATE 40 MG/ML IJ SUSP
13.3300 mg | INTRAMUSCULAR | Status: AC | PRN
  Administered 2021-10-25: 13.33 mg via INTRA_ARTICULAR

## 2021-10-27 MED ORDER — BUPIVACAINE HCL 0.25 % IJ SOLN
0.3300 mL | INTRAMUSCULAR | Status: AC | PRN
  Administered 2021-10-25: .33 mL

## 2021-10-27 MED ORDER — BUPIVACAINE HCL 0.25 % IJ SOLN
0.3300 mL | INTRAMUSCULAR | Status: AC | PRN
  Administered 2021-10-25: .33 mL via INTRA_ARTICULAR

## 2021-10-27 MED ORDER — METHYLPREDNISOLONE ACETATE 40 MG/ML IJ SUSP
13.3300 mg | INTRAMUSCULAR | Status: AC | PRN
  Administered 2021-10-25: 13.33 mg

## 2021-10-27 NOTE — Progress Notes (Signed)
? ?Office Visit Note ?  ?Patient: Carrie Reynolds           ?Date of Birth: Oct 07, 1955           ?MRN: [id number] ?Visit Date: 10/25/2021 ?Requested by: Leonard Downing, MD ?[address] ?[place],  [place] [address] ?PCP: Leonard Downing, MD ? ?Subjective: ?Chief Complaint  ?Patient presents with  ? Other  ?   ?Bilateral hand pain  ? ? ?HPI: Patient presents for evaluation of bilateral hand pain.  She has bilateral index trigger fingers which have been going on for several months.  She says she also has arthritis in her hands.  She is right-hand dominant.  Having a little bit of a tremor issue with the right hand.  She has had pain at the base of the thumb left greater than right for 9 months.  She is seeing a neurologist at [facility] on May 3.  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  She would like to have surgical intervention for [facility] arthritis and trigger finger on the left.  She is requesting injections for the similar problem she is having on the right ?             ?ROS: All systems reviewed are negative as they relate to the chief complaint within the history of present illness.  Patient denies  fevers or chills. ? ? ?Assessment & Plan: ?Visit Diagnoses:  ?1. Bilateral hand pain   ? ? ?Plan: Impression is bilateral index trigger finger.  She also has bilateral CMC arthritis.  Left is worse than the right.  She like to consider surgical intervention consisting of CMC arthroplasty and trigger finger release on that left hand.  We will refer her to Dr. Sherilyn Cooter for that intervention.  Regarding her right hand we will inject the [facility] joint under ultrasound today and inject that tendon sheath under ultrasound as well.  Patient tolerated both injections well.  We will see her back as needed. ? ?Follow-Up Instructions: No follow-ups on file.  ? ?Orders:  ?Orders Placed This Encounter  ?Procedures  ? XR Hand Complete Right  ? XR Hand Complete Left  ? US Guided Needle  Placement - No Linked Charges  ? ?No orders of the defined types were placed in this encounter. ? ? ? ? Procedures: ?Hand/UE Inj: R index A1 for trigger finger on 10/25/2021 11:22 PM ?Indications: therapeutic ?Details: 25 G needle, ultrasound-guided volar approach ?Medications: 0.33 mL bupivacaine 0.25 %; 13.33 mg methylPREDNISolone acetate 40 MG/ML; 3 mL lidocaine 1 % ?Outcome: tolerated well, no immediate complications ?Procedure, treatment alternatives, risks and benefits explained, specific risks discussed. Consent was given by the patient. Immediately prior to procedure a time out was called to verify the correct patient, procedure, equipment, support staff and site/side marked as required. Patient was prepped and draped in the usual sterile fashion.  ? ? ?Small Joint Inj: R thumb CMC on 10/25/2021 11:23 PM ?Indications: pain ?Details: 25 G needle, ultrasound-guided radial approach ? ?Spinal Needle: No ? ?Medications: 3 mL lidocaine 1 %; 0.33 mL bupivacaine 0.25 %; 13.33 mg methylPREDNISolone acetate 40 MG/ML ?Outcome: tolerated well, no immediate complications ?Procedure, treatment alternatives, risks and benefits explained, specific risks discussed. Consent was given by the patient. Immediately prior to procedure a time out was called to verify the correct patient, procedure, equipment, support staff and site/side marked as required. Patient was prepped and draped in the usual sterile fashion.  ? ? ? ? ?  Clinical Data: ?No additional findings. ? ?Objective: ?Vital Signs: LMP 07/24/2003 (Within Months)  ? ?Physical Exam:  ? ?Constitutional: Patient appears well-developed ?HEENT:  ?Head: Normocephalic ?Eyes:EOM are normal ?Neck: Normal range of motion ?Cardiovascular: Normal rate ?Pulmonary/chest: Effort normal ?Neurologic: Patient is alert ?Skin: Skin is warm ?Psychiatric: Patient has normal mood and affect ? ? ?Ortho Exam: Ortho exam demonstrates positive grind test bilaterally with intact EPL FPL interosseous  function.  Does have tenderness at the A1 pulley index finger on the right and left hand.  Wrist range of motion intact.  Radial pulse intact bilaterally.  No paresthesias on the dorsal or palmar aspect of the hand. ? ?Specialty Comments:  ?No specialty comments available. ? ?Imaging: ?No results found. ? ? ?PMFS History: ?Patient Active Problem List  ? Diagnosis Date Noted  ? Bloating symptom 09/08/2021  ? 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 Ulcer of small intestine 09/06/2021  ? Acute blood loss anemia 09/06/2021  ? Iron deficiency 09/06/2021  ? Colon cancer screening 09/06/2021  ? Long term (current) use of antithrombotics/antiplatelets 09/06/2021  ? Jejunal ulcer 08/21/2021  ? Syncope 07/15/2021  ? Sinus bradycardia 11/03/2019  ? Atypical angina ([place]) 10/12/2019  ? Fatigue 10/12/2019  ? Presence of 2 overlapping Drug Coated Stents in LAD coronary artery   ? Coronary artery disease involving native coronary artery of native heart with angina pectoris ([place]) 06/17/2019  ? DOE (dyspnea on exertion) 06/16/2019  ? CAD S/P DES PCI-proximal LAD 05/20/2019  ? Agatston coronary artery calcium score greater than 400 04/16/2019  ? Migraine with vertigo 07/29/2018  ? Rapid palpitations 04/16/2017  ? Systolic murmur 85/63/1497  ? 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  ? OSA on CPAP 11/19/2013  ? Hyperlipidemia with target LDL less than 70 07/29/2013  ? Obesity (BMI 30.0-34.9) 04/25/2012  ? Depression with anxiety 04/25/2012  ? Environmental allergies 04/25/2012  ? Essential (primary) hypertension 04/25/2012  ? Chiari malformation type I ([place]) 01/03/2012  ? Postmenopausal 09/18/2011  ? ?Past Medical History:  ?Diagnosis Date  ? Anxiety   ? Arnold-Chiari malformation ([place]) 2006  ? CAD S/P PCI 05/06/2019  ? 05/06/2019: prox LAD (@ SPI) PCI with Resolute Onyx DES 3.5  x 8, Residual 60% PDA, Normal LVF; 07/02/2019: relook Cath for Anterior Ischemia on [place] --> CATH 70% pre-stent/proximal edge dissection --> proximal overlapping DES  (Resolute Onyx 3.5 x 8 - new & old stent post-dilated to 3.8 mm)  ? Cervical strain   ? syndrome  ? Cervicogenic headache   ? Chronic kidney disease   ? Concussion with loss of consciousness 10/09/2017  ? CTS (carpal tunnel syndrome)   ? Depression   ? GERD (gastroesophageal reflux disease)   ? Glucose intolerance (impaired glucose tolerance)   ? High coronary artery calcium score   ? Score of 1460.  Coronary CTA shows high-grade proximal-mid LAD stenosis (CT FFR 0.50).  Also PDA/PLA 60 to 70% stenosis (CTO FFR 0.75)  ? History of kidney stones   ? Hypertension   ? Migraine headache   ? with visual aura  ? Obesity   ? OSA (obstructive sleep apnea)   ? not treated  ? Postmenopausal   ? Sleep apnea   ? Vertigo   ? post concussive  ?  ?Family History  ?Problem Relation Age of Onset  ? Hypertension Mother   ? Lung cancer Father   ? Alzheimer's disease Father   ? Migraines Father   ? Hyperlipidemia Sister   ? Hypertension Sister   ?  Colon cancer Maternal Grandmother   ? Heart failure Maternal Grandfather   ? Alzheimer's disease Paternal Grandmother   ? Lung cancer Paternal Grandfather   ? Esophageal cancer Neg Hx   ? Inflammatory bowel disease Neg Hx   ? Liver disease Neg Hx   ? Pancreatic cancer Neg Hx   ? Rectal cancer Neg Hx   ? Stomach cancer Neg Hx   ?  ?Past Surgical History:  ?Procedure Laterality Date  ? BIOPSY  07/15/2021  ? Procedure: BIOPSY;  Surgeon: Irving Copas., MD;  Location: [facility];  Service: Gastroenterology;;  ? Lillard Anes    ? right great toe  ? CHOLECYSTECTOMY N/A 06/05/2018  ? Procedure: LAPAROSCOPIC CHOLECYSTECTOMY WITH POSSIBLE INTRAOPERATIVE CHOLANGIOGRAM;  Surgeon: Erroll Luna, MD;  Location: [facility];  Service: General;  Laterality: N/A;  ? COLOSTOMY    ? CORONARY STENT INTERVENTION N/A 05/12/2019  ? Procedure: CORONARY STENT INTERVENTION;  Surgeon: Leonie Man, MD;  Location: [facility];  Service: Cardiovascular;;; Culprit-p-mLAD 95% 950% SP1) --> Scoring  Balloon PTCA -> DES PCI (Resolute DES 3.5 x 38 - 3.8 mm; 0% LAD& 55% SP1). -->  SP1 was somewhat jailed with TIMI II flow post PCI.  ? CORONARY STENT INTERVENTION N/A 07/02/2019  ? Procedure: CORONARY STENT INTERVENTI

## 2021-10-28 ENCOUNTER — Encounter: Payer: Self-pay | Admitting: Gastroenterology

## 2021-10-30 NOTE — Progress Notes (Signed)
Scheduled for 4-18 @ 330 ?

## 2021-11-07 ENCOUNTER — Ambulatory Visit: Payer: Medicare Other | Admitting: Orthopedic Surgery

## 2021-11-22 ENCOUNTER — Other Ambulatory Visit: Payer: Self-pay | Admitting: Gastroenterology

## 2022-02-23 ENCOUNTER — Other Ambulatory Visit: Payer: Self-pay

## 2022-03-05 ENCOUNTER — Telehealth: Payer: Self-pay | Admitting: Cardiology

## 2022-03-05 NOTE — Telephone Encounter (Signed)
  Pt c/o of Chest Pain: STAT if CP now or developed within 24 hours  1. Are you having CP right now? yes  2. Are you experiencing any other symptoms (ex. SOB, nausea, vomiting, sweating)? SOB, left arm went numb   3. How long have you been experiencing CP? Today. 30 mins ago  4. Is your CP continuous or coming and going?   5. Have you taken Nitroglycerin? Yes   Pt said,  30 mins ago she had CP her left arm went numb and SOB. She said she did take nitroglycerine and the pain subside a little bit. She made an appt with [name] on Wednesday  ?

## 2022-03-05 NOTE — Telephone Encounter (Signed)
Call sent straight to triage. Patient has been having chest pian, SOB, and numbness/tingling in left arm that just started 30 minutes ago. Advised patient to go to ED to get evaluated.

## 2022-03-07 ENCOUNTER — Encounter: Payer: Self-pay | Admitting: Physician Assistant

## 2022-03-07 ENCOUNTER — Ambulatory Visit (INDEPENDENT_AMBULATORY_CARE_PROVIDER_SITE_OTHER): Payer: Medicare Other | Admitting: Physician Assistant

## 2022-03-07 VITALS — BP 128/76 | HR 66 | Ht 67.0 in | Wt 206.4 lb

## 2022-03-07 DIAGNOSIS — R072 Precordial pain: Secondary | ICD-10-CM | POA: Diagnosis not present

## 2022-03-07 DIAGNOSIS — I10 Essential (primary) hypertension: Secondary | ICD-10-CM | POA: Diagnosis not present

## 2022-03-07 DIAGNOSIS — I208 Other forms of angina pectoris: Secondary | ICD-10-CM | POA: Diagnosis not present

## 2022-03-07 DIAGNOSIS — E785 Hyperlipidemia, unspecified: Secondary | ICD-10-CM

## 2022-03-07 DIAGNOSIS — I25119 Atherosclerotic heart disease of native coronary artery with unspecified angina pectoris: Secondary | ICD-10-CM

## 2022-03-07 NOTE — Patient Instructions (Signed)
Medication Instructions:  Your physician recommends that you continue on your current medications as directed. Please refer to the Current Medication list given to you today.  *If you need a refill on your cardiac medications before your next appointment, please call your pharmacy*   Lab Work: CBC and BMP If you have labs (blood work) drawn today and your tests are completely normal, you will receive your results only by: [place] (if you have MyChart) OR A paper copy in the mail If you have any lab test that is abnormal or we need to change your treatment, we will call you to review the results.   Testing/Procedures: Your physician has requested that you have a lexiscan myoview. For further information please visit [email or web address]. Please follow instruction sheet, as given.   The test will take approximately 3 to 4 hours to complete; you may bring reading material.  If someone comes with you to your appointment, they will need to remain in the main lobby due to limited space in the testing area. **If you are pregnant or breastfeeding, please notify the nuclear lab prior to your appointment**  How to prepare for your Myocardial Perfusion Test: Do not eat or drink 3 hours prior to your test, except you may have water. Do not consume products containing caffeine (regular or decaffeinated) 12 hours prior to your test. (ex: coffee, chocolate, sodas, tea). Do bring a list of your current medications with you.  If not listed below, you may take your medications as normal. Do wear comfortable clothes (no dresses or overalls) and walking shoes, tennis shoes preferred (No heels or open toe shoes are allowed). Do NOT wear cologne, perfume, aftershave, or lotions (deodorant is allowed). If these instructions are not followed, your test will have to be rescheduled.    Follow-Up: At [facility], you and your health needs are our priority.  As part of our continuing mission to provide  you with exceptional heart care, we have created designated Provider Care Teams.  These Care Teams include your primary Cardiologist (physician) and Advanced Practice Providers (APPs -  Physician Assistants and Nurse Practitioners) who all work together to provide you with the care you need, when you need it.  We recommend signing up for the patient portal called "MyChart".  Sign up information is provided on this After Visit Summary.  MyChart is used to connect with patients for Virtual Visits (Telemedicine).  Patients are able to view lab/test results, encounter notes, upcoming appointments, etc.  Non-urgent messages can be sent to your provider as well.   To learn more about what you can do with MyChart, go to [email or web address].    Your next appointment:   6 week(s)  The format for your next appointment:   In Person  Provider:   [name], PA-C

## 2022-03-07 NOTE — H&P (View-Only) (Signed)
Cardiology Office Note:    Date:  03/09/2022   ID:  Carrie Reynolds, DOB June 22, 1956, MRN [id number]  PCP:  Leonard Downing, MD   [place] [facility] Cardiologist:  [name], MD     Referring MD: Leonard Downing, *   Chief Complaint  Patient presents with   Follow-up    Seen for Dr. Ellyn Hack    History of Present Illness:    Carrie Reynolds is a 66 y.o. female with a hx of CAD s/p DESx 2 to LAD, sinus bradycardia, hypertension, hyperlipidemia, and OSA.  Cardiac catheterization performed on 05/12/2019 following abnormal coronary CT revealed 95% proximal to mid LAD lesion treated with Resolute Onyx 3.5 x 8 mm DES, 50% lesion in the sidebranch in first septal perforator.  Due to recurrent chest pain and abnormal Myoview, patient underwent cardiac catheterization again on 07/02/2019 that revealed EF 55 to 65%, widely patent proximal to mid LAD stent with 70% stenosis proximal to the stent edge due to likely dissection, this was treated with Resolute Onyx 3.5 x 8 mm DES, 25% mid LAD lesion, 30% ramus lesion, 60% RPAV lesion.  She had a nuclear stress test in April 2021 and Apr 2022 [facility]) which were nonischemic.  She is no longer on beta-blocker due to sinus bradycardia.  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  Patient was admitted at [facility] due to arm pain and a near syncope in April 2022, Myoview was nonischemic.  The incidence was felt to be due to dehydration.  The diltiazem stopped as her heart rate was in the 40s.  Echocardiogram performed on 12/08/2020 showed EF 60 to 65%, grade 2 DD, trivial MR.  Patient was last seen by Dr. Ellyn Hack on 08/21/2021 at which time he was doing well without further chest discomfort.  Patient presents today for follow-up.  She had an episode of chest pain about 2 nights ago, she was a sitting down watching TV at the time.  Symptom lasted about 1-1/2 minutes before going away.   She did take a nitroglycerin.  Prior to that, she had another episode of chest pain roughly 2 months ago, she was also not exerting herself at that time.  Although she says the characteristic the chest pain is similar to her previous anginal symptom back in 2020, however her symptom is atypical in the sense that it does not occur with physical exertion.  She does admit she does not do much activity.  Since the patient has a history of chest pain related to anemia, I did recommend some blood work including CBC and basic metabolic panel.  I also recommended a Lexiscan nuclear stress test as well.  We will bring the patient back in 6 weeks for reassessment.  Past Medical History:  Diagnosis Date   Anxiety    Arnold-Chiari malformation ([place]) 2006   CAD S/P PCI 05/06/2019   05/06/2019: prox LAD (@ SPI) PCI with Resolute Onyx DES 3.5  x 8, Residual 60% PDA, Normal LVF; 07/02/2019: relook Cath for Anterior Ischemia on Mhoview --> CATH 70% pre-stent/proximal edge dissection --> proximal overlapping DES (Resolute Onyx 3.5 x 8 - new & old stent post-dilated to 3.8 mm)   Cervical strain    syndrome   Cervicogenic headache    Chronic kidney disease    Concussion with loss of consciousness 10/09/2017   CTS (carpal tunnel syndrome)    Depression    GERD (  gastroesophageal reflux disease)    Glucose intolerance (impaired glucose tolerance)    High coronary artery calcium score    Score of 1460.  Coronary CTA shows high-grade proximal-mid LAD stenosis (CT FFR 0.50).  Also PDA/PLA 60 to 70% stenosis (CTO FFR 0.75)   History of kidney stones    Hypertension    Migraine headache    with visual aura   Obesity    OSA (obstructive sleep apnea)    not treated   Postmenopausal    Sleep apnea    Vertigo    post concussive    Past Surgical History:  Procedure Laterality Date   BIOPSY  07/15/2021   Procedure: BIOPSY;  Surgeon: Rush Landmark Telford Nab., MD;  Location: [place];  Service: Gastroenterology;;    Lillard Anes     right great toe   CHOLECYSTECTOMY N/A 06/05/2018   Procedure: LAPAROSCOPIC CHOLECYSTECTOMY WITH POSSIBLE INTRAOPERATIVE CHOLANGIOGRAM;  Surgeon: Erroll Luna, MD;  Location: [place];  Service: General;  Laterality: N/A;   COLOSTOMY     CORONARY STENT INTERVENTION N/A 05/12/2019   Procedure: CORONARY STENT INTERVENTION;  Surgeon: Leonie Man, MD;  Location: [place] [facility];  Service: Cardiovascular;;; Culprit-p-mLAD 95% 950% SP1) --> Scoring Balloon PTCA -> DES PCI (Resolute DES 3.5 x 38 - 3.8 mm; 0% LAD& 55% SP1). -->  SP1 was somewhat jailed with TIMI II flow post PCI.   CORONARY STENT INTERVENTION N/A 07/02/2019   Procedure: CORONARY STENT INTERVENTION;  Surgeon: Leonie Man, MD;  Location: [place] [facility];  Service: Cardiovascular;; * 70% Proximal stent edge dissection (edge of previous stent that is patent) -->  successfully covered with additional RESOLUTE ONYX DES 3.5 mm x 8 mm overlapping proximal edge of previous stent (postdilated to 3.8 mm)   ENTEROSCOPY N/A 07/15/2021   Procedure: ENTEROSCOPY;  Surgeon: Rush Landmark Telford Nab., MD;  Location: [place];  Service: Gastroenterology;  Laterality: N/A;   GASTRIC BYPASS  11/2015   [facility]   GYNECOLOGIC CRYOSURGERY     HAND SURGERY     LEFT HEART CATH AND CORONARY ANGIOGRAPHY N/A 05/12/2019   Procedure: LEFT HEART CATH AND CORONARY ANGIOGRAPHY;  Surgeon: Leonie Man, MD;  Location: [place] [facility];  Service: Cardiovascular;;; Culprit-p-mLAD 95% 950% SP1) --> DES PCI.;  RPAV 60% -small caliber vessel, did not appear flow-limiting (medical management).  EF 55 to 60%.  Normal LVEDP.   LEFT HEART CATH AND CORONARY ANGIOGRAPHY N/A 07/02/2019   Procedure: LEFT HEART CATH AND CORONARY ANGIOGRAPHY;  Surgeon: Leonie Man, MD;  Location: [place] [facility];  Service: Cardiovascular;; CULPRIT = ~70% proximal edge dissection of previous stent (DES PCI overlapping stent placed) - jails SP1 w/ ~60-70% ostial  stenosis.[name] mLAD 25%. RI 30%, RPAV 60%.   NM MYOVIEW LTD  06/24/2019   [facility] Adam): HIGH RISK.  EF 55%.  Large size, moderate severe defect in the anterior wall with associated anterior hypokinesis.  Suspect LAD territory.   NM MYOVIEW LTD  10/23/2019   Normal EF 55 to 60%.  No EKG changes.  Small size mild severity fixed defect in the apical wall consistent with breast attenuation versus small prior infarct.  Marked improvement from anterior apical perfusion abnormality seen in February 2020.   TRANSTHORACIC ECHOCARDIOGRAM  12/08/2020   Normal LV Fxn - EF 60-65%. No RWMA. Mod Asymmetric Basal Septa LVH with mild Concentric LVH. Gr II DD. Normal Longitudinal Strain. Normal RV size & fxn - normal RAP/CVP.  Normal MV. Mild AoV sclerosis  w/ stenosis. Mild Ascending Ao dilation - ~38 mm (borderline).  => No change from 04/2017.   TUBAL LIGATION Bilateral    UPPER GASTROINTESTINAL ENDOSCOPY     VAGINAL HYSTERECTOMY  2003   with LSO and right salpingectomy; right ovary still present   [facility] MONITOR-14-day  01/2021   Predominant rhythm sinus-heart rate range 40 to 124 bpm.  Average 70 bpm.  Rare PVCs and isolated PACs noted.  PAC couplets and triplets also noted as well as trigeminy.  3 atrial runs of 4-5 beats ranging from 102 to 150 bpm.  (2 of 3 noted on patient trigger.  Remainder patient triggers were sinus rhythm with PACs or PVCs); no atrial fibrillation or other sustained arrhythmias noted.    Current Medications: Current Meds  Medication Sig   cetirizine (ZYRTEC) 10 MG tablet Take 1 tablet by mouth at bedtime.   clopidogrel (PLAVIX) 75 MG tablet TAKE 1 TABLET BY MOUTH EVERY DAY (Patient taking differently: Take 75 mg by mouth daily.)   diclofenac Sodium (VOLTAREN) 1 % GEL APPLY 2 GRAMS TO AFFECTED AREA TWICE A DAY   ferrous sulfate 325 (65 FE) MG tablet Take 1 tablet (325 mg total) by mouth daily with breakfast.   losartan-hydrochlorothiazide (HYZAAR) 100-12.5 MG tablet Take 0.5 tablets by  mouth daily.   nitroGLYCERIN (NITROSTAT) 0.4 MG SL tablet Place 1 tablet (0.4 mg total) under the tongue every 5 (five) minutes as needed.   propranolol (INDERAL) 10 MG tablet May take  1 to 2  tablets a day  as needed for palpiptation   rosuvastatin (CRESTOR) 40 MG tablet Take 40 mg by mouth daily.   sertraline (ZOLOFT) 50 MG tablet Take 50 mg by mouth in the morning and at bedtime.     Allergies:   Ceclor [cefaclor], Tetracyclines & related, Lipitor [atorvastatin calcium], Penicillins, Amoxicillin, Ampicillin, Lisinopril, and Simvastatin   Social History   Socioeconomic History   Marital status: Married    Spouse name: Ardyn Forge   Number of children: 2   Years of education: 12   Highest education level: High school graduate  Occupational History   Occupation: Retired Best boy: [employer]    Comment: Logistics, warehouse  Tobacco Use   Smoking status: Former    Packs/day: 0.10    Years: 30.00    Total pack years: 3.00    Types: Cigarettes    Quit date: 04/26/2012    Years since quitting: 9.8   Smokeless tobacco: Never  Vaping Use   Vaping Use: Never used  Substance and Sexual Activity   Alcohol use: Yes    Comment: occasional   Drug use: Yes    Types: Marijuana    Comment: weed juice   Sexual activity: Yes    Partners: Male    Birth control/protection: Surgical    Comment: hysterectomy  Other Topics Concern   Not on file  Social History Narrative   ** Merged History Encounter **       Lives with husband [name]). She travels very frequently with her job and is gone most weeks out of the month. She is active and exercises when able.   Social Determinants of Health   Financial Resource Strain: Low Risk  (05/28/2019)   Overall Financial Resource Strain (CARDIA)    Difficulty of Paying Living Expenses: Not hard at all  Food Insecurity: No Food Insecurity (05/28/2019)   Hunger Vital Sign    Worried About Charity fundraiser in  the Last Year:  Never true    [place] in the Last Year: Never true  Transportation Needs: No Transportation Needs (05/28/2019)   PRAPARE - Hydrologist (Medical): No    Lack of Transportation (Non-Medical): No  Physical Activity: Inactive (05/28/2019)   Exercise Vital Sign    Days of Exercise per Week: 0 days    Minutes of Exercise per Session: 0 min  Stress: Stress Concern Present (05/28/2019)   [place]    Feeling of Stress : To some extent  Social Connections: Not on file     Family History: The patient's family history includes Alzheimer's disease in her father and paternal grandmother; Colon cancer in her maternal grandmother; Heart failure in her maternal grandfather; Hyperlipidemia in her sister; Hypertension in her mother and sister; Lung cancer in her father and paternal grandfather; Migraines in her father. There is no history of Esophageal cancer, Inflammatory bowel disease, Liver disease, Pancreatic cancer, Rectal cancer, or Stomach cancer.  ROS:   Please see the history of present illness.     All other systems reviewed and are negative.  EKGs/Labs/Other Studies Reviewed:    The following studies were reviewed today:  Echo 12/08/2020  1. Left ventricular ejection fraction, by estimation, is 60 to 65%. Left  ventricular ejection fraction by 3D volume is 64 %. The left ventricle has  normal function. The left ventricle has no regional wall motion  abnormalities. There is moderate  asymmetric hypertrophy of the basal septum measuring 1.5cm. The rest of  the LV segments demonstrate mild concentric left ventricular hypertrophy.  Left ventricular diastolic parameters are consistent with Grade II  diastolic dysfunction  (pseudonormalization). The average left ventricular global longitudinal  strain is -22.9 %. The global longitudinal strain is normal.   2. Right ventricular systolic  function is normal. The right ventricular  size is normal. There is normal pulmonary artery systolic pressure. The  estimated right ventricular systolic pressure is 75.1 mmHg.   3. The mitral valve is normal in structure. Trivial mitral valve  regurgitation. No evidence of mitral stenosis.   4. The aortic valve is tricuspid. There is mild calcification of the  aortic valve. There is mild thickening of the aortic valve. Aortic valve  regurgitation is trivial. Mild aortic valve sclerosis is present, with no  evidence of aortic valve stenosis.   5. Aortic dilatation noted. There is mild dilatation of the ascending  aorta, measuring 38 mm.   6. The inferior vena cava is normal in size with greater than 50%  respiratory variability, suggesting right atrial pressure of 3 mmHg.   Comparison(s): Compared to prior TTE in 2018, there is no significant  change. 05/01/17 EF 60-65%.   EKG:  EKG is ordered today.  The ekg ordered today demonstrates normal sinus rhythm, poor R wave progression in the anterior leads.  Recent Labs: 07/15/2021: TSH 1.216 09/06/2021: ALT 14; BUN 20; Creatinine, Ser 0.95; Hemoglobin 13.1; Platelets 176.0; Potassium 4.0; Sodium 142  Recent Lipid Panel    Component Value Date/Time   CHOL 138 06/24/2020 1152   TRIG 151 (H) 06/24/2020 1152   HDL 60 06/24/2020 1152   CHOLHDL 2.3 06/24/2020 1152   CHOLHDL 5.5 04/25/2012 1406   VLDL 76 (H) 04/25/2012 1406   LDLCALC 53 06/24/2020 1152     Risk Assessment/Calculations:           Physical Exam:    VS:  BP 128/76   Pulse 66   Ht '5\' 7"'$  (1.702 m)   Wt 206 lb 6.4 oz (93.6 kg)   LMP 07/24/2003 (Within Months)   SpO2 95%   BMI 32.33 kg/m        Wt Readings from Last 3 Encounters:  03/07/22 206 lb 6.4 oz (93.6 kg)  10/24/21 201 lb (91.2 kg)  09/06/21 201 lb (91.2 kg)     GEN:  Well nourished, well developed in no acute distress HEENT: Normal NECK: No JVD; No carotid bruits LYMPHATICS: No  lymphadenopathy CARDIAC: RRR, no murmurs, rubs, gallops RESPIRATORY:  Clear to auscultation without rales, wheezing or rhonchi  ABDOMEN: Soft, non-tender, non-distended MUSCULOSKELETAL:  No edema; No deformity  SKIN: Warm and dry NEUROLOGIC:  Alert and oriented x 3 PSYCHIATRIC:  Normal affect   ASSESSMENT:    1. Precordial pain   2. Coronary artery disease involving native coronary artery of native heart with angina pectoris ([place])   3. Primary hypertension   4. Hyperlipidemia LDL goal <70    PLAN:    In order of problems listed above:  Precordial chest pain: Although characteristic of the chest pain similar to the previous angina, symptom is brief and does not occur with physical activity.  I recommended proceed with nuclear stress test  CAD: On Plavix and Crestor  Hypertension: Blood pressure stable  Hyperlipidemia: On Crestor      Shared Decision Making/Informed Consent The risks [chest pain, shortness of breath, cardiac arrhythmias, dizziness, blood pressure fluctuations, myocardial infarction, stroke/transient ischemic attack, nausea, vomiting, allergic reaction, radiation exposure, metallic taste sensation and life-threatening complications (estimated to be 1 in 10,000)], benefits (risk stratification, diagnosing coronary artery disease, treatment guidance) and alternatives of a nuclear stress test were discussed in detail with Ms. Culbreth and she agrees to proceed.    Medication Adjustments/Labs and Tests Ordered: Current medicines are reviewed at length with the patient today.  Concerns regarding medicines are outlined above.  Orders Placed This Encounter  Procedures   CBC   Basic Metabolic Panel (BMET)   Cardiac Stress Test: Informed Consent Details: Physician/Practitioner Attestation; Transcribe to consent form and obtain patient signature   MYOCARDIAL PERFUSION IMAGING   EKG 12-Lead   No orders of the defined types were placed in this encounter.   Patient  Instructions  Medication Instructions:  Your physician recommends that you continue on your current medications as directed. Please refer to the Current Medication list given to you today.  *If you need a refill on your cardiac medications before your next appointment, please call your pharmacy*   Lab Work: CBC and BMP If you have labs (blood work) drawn today and your tests are completely normal, you will receive your results only by: [place] (if you have MyChart) OR A paper copy in the mail If you have any lab test that is abnormal or we need to change your treatment, we will call you to review the results.   Testing/Procedures: Your physician has requested that you have a lexiscan myoview. For further information please visit [email or web address]. Please follow instruction sheet, as given.   The test will take approximately 3 to 4 hours to complete; you may bring reading material.  If someone comes with you to your appointment, they will need to remain in the main lobby due to limited space in the testing area. **If you are pregnant or breastfeeding, please notify the nuclear lab prior to your appointment**  How to prepare for your Myocardial Perfusion Test:  Do not eat or drink 3 hours prior to your test, except you may have water. Do not consume products containing caffeine (regular or decaffeinated) 12 hours prior to your test. (ex: coffee, chocolate, sodas, tea). Do bring a list of your current medications with you.  If not listed below, you may take your medications as normal. Do wear comfortable clothes (no dresses or overalls) and walking shoes, tennis shoes preferred (No heels or open toe shoes are allowed). Do NOT wear cologne, perfume, aftershave, or lotions (deodorant is allowed). If these instructions are not followed, your test will have to be rescheduled.    Follow-Up: At [facility], you and your health needs are our priority.  As part of our continuing  mission to provide you with exceptional heart care, we have created designated Provider Care Teams.  These Care Teams include your primary Cardiologist (physician) and Advanced Practice Providers (APPs -  Physician Assistants and Nurse Practitioners) who all work together to provide you with the care you need, when you need it.  We recommend signing up for the patient portal called "MyChart".  Sign up information is provided on this After Visit Summary.  MyChart is used to connect with patients for Virtual Visits (Telemedicine).  Patients are able to view lab/test results, encounter notes, upcoming appointments, etc.  Non-urgent messages can be sent to your provider as well.   To learn more about what you can do with MyChart, go to [email or web address].    Your next appointment:   6 week(s)  The format for your next appointment:   In Person  Provider:   [name], PA-C        Signed, [name], [place]  03/09/2022 11:55 PM    [place]

## 2022-03-07 NOTE — Progress Notes (Signed)
Cardiology Office Note:    Date:  03/09/2022   ID:  Carrie Reynolds, DOB Apr 17, 1956, MRN [id number]  PCP:  Leonard Downing, MD   [facility] Providers Cardiologist:  [name], MD     Referring MD: Leonard Downing, *   Chief Complaint  Patient presents with   Follow-up    Seen for Dr. Ellyn Hack    History of Present Illness:    Carrie Reynolds is a 66 y.o. female with a hx of CAD s/p DESx 2 to LAD, sinus bradycardia, hypertension, hyperlipidemia, and OSA.  Cardiac catheterization performed on 05/12/2019 following abnormal coronary CT revealed 95% proximal to mid LAD lesion treated with Resolute Onyx 3.5 x 8 mm DES, 50% lesion in the sidebranch in first septal perforator.  Due to recurrent chest pain and abnormal Myoview, patient underwent cardiac catheterization again on 07/02/2019 that revealed EF 55 to 65%, widely patent proximal to mid LAD stent with 70% stenosis proximal to the stent edge due to likely dissection, this was treated with Resolute Onyx 3.5 x 8 mm DES, 25% mid LAD lesion, 30% ramus lesion, 60% RPAV lesion.  She had a nuclear stress test in April 2021 and Apr 2022 [facility]) which were nonischemic.  She is no longer on beta-blocker due to sinus bradycardia.  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  Patient was admitted at [facility] due to arm pain and a near syncope in April 2022, Myoview was nonischemic.  The incidence was felt to be due to dehydration.  The diltiazem stopped as her heart rate was in the 40s.  Echocardiogram performed on 12/08/2020 showed EF 60 to 65%, grade 2 DD, trivial MR.  Patient was last seen by Dr. Ellyn Hack on 08/21/2021 at which time he was doing well without further chest discomfort.  Patient presents today for follow-up.  She had an episode of chest pain about 2 nights ago, she was a sitting down watching TV at the time.  Symptom lasted about 1-1/2 minutes before going away.   She did take a nitroglycerin.  Prior to that, she had another episode of chest pain roughly 2 months ago, she was also not exerting herself at that time.  Although she says the characteristic the chest pain is similar to her previous anginal symptom back in 2020, however her symptom is atypical in the sense that it does not occur with physical exertion.  She does admit she does not do much activity.  Since the patient has a history of chest pain related to anemia, I did recommend some blood work including CBC and basic metabolic panel.  I also recommended a Lexiscan nuclear stress test as well.  We will bring the patient back in 6 weeks for reassessment.  Past Medical History:  Diagnosis Date   Anxiety    Arnold-Chiari malformation ([place]) 2006   CAD S/P PCI 05/06/2019   05/06/2019: prox LAD (@ SPI) PCI with Resolute Onyx DES 3.5  x 8, Residual 60% PDA, Normal LVF; 07/02/2019: relook Cath for Anterior Ischemia on Mhoview --> CATH 70% pre-stent/proximal edge dissection --> proximal overlapping DES (Resolute Onyx 3.5 x 8 - new & old stent post-dilated to 3.8 mm)   Cervical strain    syndrome   Cervicogenic headache    Chronic kidney disease    Concussion with loss of consciousness 10/09/2017   CTS (carpal tunnel syndrome)    Depression    GERD (  gastroesophageal reflux disease)    Glucose intolerance (impaired glucose tolerance)    High coronary artery calcium score    Score of 1460.  Coronary CTA shows high-grade proximal-mid LAD stenosis (CT FFR 0.50).  Also PDA/PLA 60 to 70% stenosis (CTO FFR 0.75)   History of kidney stones    Hypertension    Migraine headache    with visual aura   Obesity    OSA (obstructive sleep apnea)    not treated   Postmenopausal    Sleep apnea    Vertigo    post concussive    Past Surgical History:  Procedure Laterality Date   BIOPSY  07/15/2021   Procedure: BIOPSY;  Surgeon: Rush Landmark Telford Nab., MD;  Location: [place];  Service: Gastroenterology;;    Lillard Anes     right great toe   CHOLECYSTECTOMY N/A 06/05/2018   Procedure: LAPAROSCOPIC CHOLECYSTECTOMY WITH POSSIBLE INTRAOPERATIVE CHOLANGIOGRAM;  Surgeon: Erroll Luna, MD;  Location: [place];  Service: General;  Laterality: N/A;   COLOSTOMY     CORONARY STENT INTERVENTION N/A 05/12/2019   Procedure: CORONARY STENT INTERVENTION;  Surgeon: Leonie Man, MD;  Location: [place] [facility];  Service: Cardiovascular;;; Culprit-p-mLAD 95% 950% SP1) --> Scoring Balloon PTCA -> DES PCI (Resolute DES 3.5 x 38 - 3.8 mm; 0% LAD& 55% SP1). -->  SP1 was somewhat jailed with TIMI II flow post PCI.   CORONARY STENT INTERVENTION N/A 07/02/2019   Procedure: CORONARY STENT INTERVENTION;  Surgeon: Leonie Man, MD;  Location: [place] [facility];  Service: Cardiovascular;; * 70% Proximal stent edge dissection (edge of previous stent that is patent) -->  successfully covered with additional RESOLUTE ONYX DES 3.5 mm x 8 mm overlapping proximal edge of previous stent (postdilated to 3.8 mm)   ENTEROSCOPY N/A 07/15/2021   Procedure: ENTEROSCOPY;  Surgeon: Rush Landmark Telford Nab., MD;  Location: [place];  Service: Gastroenterology;  Laterality: N/A;   GASTRIC BYPASS  11/2015   [facility]   GYNECOLOGIC CRYOSURGERY     HAND SURGERY     LEFT HEART CATH AND CORONARY ANGIOGRAPHY N/A 05/12/2019   Procedure: LEFT HEART CATH AND CORONARY ANGIOGRAPHY;  Surgeon: Leonie Man, MD;  Location: [place] [facility];  Service: Cardiovascular;;; Culprit-p-mLAD 95% 950% SP1) --> DES PCI.;  RPAV 60% -small caliber vessel, did not appear flow-limiting (medical management).  EF 55 to 60%.  Normal LVEDP.   LEFT HEART CATH AND CORONARY ANGIOGRAPHY N/A 07/02/2019   Procedure: LEFT HEART CATH AND CORONARY ANGIOGRAPHY;  Surgeon: Leonie Man, MD;  Location: [place] [facility];  Service: Cardiovascular;; CULPRIT = ~70% proximal edge dissection of previous stent (DES PCI overlapping stent placed) - jails SP1 w/ ~60-70% ostial  stenosis.[name] mLAD 25%. RI 30%, RPAV 60%.   NM MYOVIEW LTD  06/24/2019   Carlton Adam): HIGH RISK.  EF 55%.  Large size, moderate severe defect in the anterior wall with associated anterior hypokinesis.  Suspect LAD territory.   NM MYOVIEW LTD  10/23/2019   Normal EF 55 to 60%.  No EKG changes.  Small size mild severity fixed defect in the apical wall consistent with breast attenuation versus small prior infarct.  Marked improvement from anterior apical perfusion abnormality seen in February 2020.   TRANSTHORACIC ECHOCARDIOGRAM  12/08/2020   Normal LV Fxn - EF 60-65%. No RWMA. Mod Asymmetric Basal Septa LVH with mild Concentric LVH. Gr II DD. Normal Longitudinal Strain. Normal RV size & fxn - normal RAP/CVP.  Normal MV. Mild AoV sclerosis  w/ stenosis. Mild Ascending Ao dilation - ~38 mm (borderline).  => No change from 04/2017.   TUBAL LIGATION Bilateral    UPPER GASTROINTESTINAL ENDOSCOPY     VAGINAL HYSTERECTOMY  2003   with LSO and right salpingectomy; right ovary still present   ZIO [facility] MONITOR-14-day  01/2021   Predominant rhythm sinus-heart rate range 40 to 124 bpm.  Average 70 bpm.  Rare PVCs and isolated PACs noted.  PAC couplets and triplets also noted as well as trigeminy.  3 atrial runs of 4-5 beats ranging from 102 to 150 bpm.  (2 of 3 noted on patient trigger.  Remainder patient triggers were sinus rhythm with PACs or PVCs); no atrial fibrillation or other sustained arrhythmias noted.    Current Medications: Current Meds  Medication Sig   cetirizine (ZYRTEC) 10 MG tablet Take 1 tablet by mouth at bedtime.   clopidogrel (PLAVIX) 75 MG tablet TAKE 1 TABLET BY MOUTH EVERY DAY (Patient taking differently: Take 75 mg by mouth daily.)   diclofenac Sodium (VOLTAREN) 1 % GEL APPLY 2 GRAMS TO AFFECTED AREA TWICE A DAY   ferrous sulfate 325 (65 FE) MG tablet Take 1 tablet (325 mg total) by mouth daily with breakfast.   losartan-hydrochlorothiazide (HYZAAR) 100-12.5 MG tablet Take 0.5 tablets by  mouth daily.   nitroGLYCERIN (NITROSTAT) 0.4 MG SL tablet Place 1 tablet (0.4 mg total) under the tongue every 5 (five) minutes as needed.   propranolol (INDERAL) 10 MG tablet May take  1 to 2  tablets a day  as needed for palpiptation   rosuvastatin (CRESTOR) 40 MG tablet Take 40 mg by mouth daily.   sertraline (ZOLOFT) 50 MG tablet Take 50 mg by mouth in the morning and at bedtime.     Allergies:   Ceclor [cefaclor], Tetracyclines & related, Lipitor [atorvastatin calcium], Penicillins, Amoxicillin, Ampicillin, Lisinopril, and Simvastatin   Social History   Socioeconomic History   Marital status: Married    Spouse name: Tarae Wooden   Number of children: 2   Years of education: 12   Highest education level: High school graduate  Occupational History   Occupation: Retired Best boy: [employer]    Comment: Logistics, warehouse  Tobacco Use   Smoking status: Former    Packs/day: 0.10    Years: 30.00    Total pack years: 3.00    Types: Cigarettes    Quit date: 04/26/2012    Years since quitting: 9.8   Smokeless tobacco: Never  Vaping Use   Vaping Use: Never used  Substance and Sexual Activity   Alcohol use: Yes    Comment: occasional   Drug use: Yes    Types: Marijuana    Comment: weed juice   Sexual activity: Yes    Partners: Male    Birth control/protection: Surgical    Comment: hysterectomy  Other Topics Concern   Not on file  Social History Narrative   ** Merged History Encounter **       Lives with husband [name]). She travels very frequently with her job and is gone most weeks out of the month. She is active and exercises when able.   Social Determinants of Health   Financial Resource Strain: Low Risk  (05/28/2019)   Overall Financial Resource Strain (CARDIA)    Difficulty of Paying Living Expenses: Not hard at all  Food Insecurity: No Food Insecurity (05/28/2019)   Hunger Vital Sign    Worried About Charity fundraiser in  the Last Year:  Never true    [place] in the Last Year: Never true  Transportation Needs: No Transportation Needs (05/28/2019)   PRAPARE - Hydrologist (Medical): No    Lack of Transportation (Non-Medical): No  Physical Activity: Inactive (05/28/2019)   Exercise Vital Sign    Days of Exercise per Week: 0 days    Minutes of Exercise per Session: 0 min  Stress: Stress Concern Present (05/28/2019)   [place]    Feeling of Stress : To some extent  Social Connections: Not on file     Family History: The patient's family history includes Alzheimer's disease in her father and paternal grandmother; Colon cancer in her maternal grandmother; Heart failure in her maternal grandfather; Hyperlipidemia in her sister; Hypertension in her mother and sister; Lung cancer in her father and paternal grandfather; Migraines in her father. There is no history of Esophageal cancer, Inflammatory bowel disease, Liver disease, Pancreatic cancer, Rectal cancer, or Stomach cancer.  ROS:   Please see the history of present illness.     All other systems reviewed and are negative.  EKGs/Labs/Other Studies Reviewed:    The following studies were reviewed today:  Echo 12/08/2020  1. Left ventricular ejection fraction, by estimation, is 60 to 65%. Left  ventricular ejection fraction by 3D volume is 64 %. The left ventricle has  normal function. The left ventricle has no regional wall motion  abnormalities. There is moderate  asymmetric hypertrophy of the basal septum measuring 1.5cm. The rest of  the LV segments demonstrate mild concentric left ventricular hypertrophy.  Left ventricular diastolic parameters are consistent with Grade II  diastolic dysfunction  (pseudonormalization). The average left ventricular global longitudinal  strain is -22.9 %. The global longitudinal strain is normal.   2. Right ventricular systolic  function is normal. The right ventricular  size is normal. There is normal pulmonary artery systolic pressure. The  estimated right ventricular systolic pressure is 66.0 mmHg.   3. The mitral valve is normal in structure. Trivial mitral valve  regurgitation. No evidence of mitral stenosis.   4. The aortic valve is tricuspid. There is mild calcification of the  aortic valve. There is mild thickening of the aortic valve. Aortic valve  regurgitation is trivial. Mild aortic valve sclerosis is present, with no  evidence of aortic valve stenosis.   5. Aortic dilatation noted. There is mild dilatation of the ascending  aorta, measuring 38 mm.   6. The inferior vena cava is normal in size with greater than 50%  respiratory variability, suggesting right atrial pressure of 3 mmHg.   Comparison(s): Compared to prior TTE in 2018, there is no significant  change. 05/01/17 EF 60-65%.   EKG:  EKG is ordered today.  The ekg ordered today demonstrates normal sinus rhythm, poor R wave progression in the anterior leads.  Recent Labs: 07/15/2021: TSH 1.216 09/06/2021: ALT 14; BUN 20; Creatinine, Ser 0.95; Hemoglobin 13.1; Platelets 176.0; Potassium 4.0; Sodium 142  Recent Lipid Panel    Component Value Date/Time   CHOL 138 06/24/2020 1152   TRIG 151 (H) 06/24/2020 1152   HDL 60 06/24/2020 1152   CHOLHDL 2.3 06/24/2020 1152   CHOLHDL 5.5 04/25/2012 1406   VLDL 76 (H) 04/25/2012 1406   LDLCALC 53 06/24/2020 1152     Risk Assessment/Calculations:           Physical Exam:    VS:  BP 128/76   Pulse 66   Ht '5\' 7"'$  (1.702 m)   Wt 206 lb 6.4 oz (93.6 kg)   LMP 07/24/2003 (Within Months)   SpO2 95%   BMI 32.33 kg/m        Wt Readings from Last 3 Encounters:  03/07/22 206 lb 6.4 oz (93.6 kg)  10/24/21 201 lb (91.2 kg)  09/06/21 201 lb (91.2 kg)     GEN:  Well nourished, well developed in no acute distress HEENT: Normal NECK: No JVD; No carotid bruits LYMPHATICS: No  lymphadenopathy CARDIAC: RRR, no murmurs, rubs, gallops RESPIRATORY:  Clear to auscultation without rales, wheezing or rhonchi  ABDOMEN: Soft, non-tender, non-distended MUSCULOSKELETAL:  No edema; No deformity  SKIN: Warm and dry NEUROLOGIC:  Alert and oriented x 3 PSYCHIATRIC:  Normal affect   ASSESSMENT:    1. Precordial pain   2. Coronary artery disease involving native coronary artery of native heart with angina pectoris ([place])   3. Primary hypertension   4. Hyperlipidemia LDL goal <70    PLAN:    In order of problems listed above:  Precordial chest pain: Although characteristic of the chest pain similar to the previous angina, symptom is brief and does not occur with physical activity.  I recommended proceed with nuclear stress test  CAD: On Plavix and Crestor  Hypertension: Blood pressure stable  Hyperlipidemia: On Crestor      Shared Decision Making/Informed Consent The risks [chest pain, shortness of breath, cardiac arrhythmias, dizziness, blood pressure fluctuations, myocardial infarction, stroke/transient ischemic attack, nausea, vomiting, allergic reaction, radiation exposure, metallic taste sensation and life-threatening complications (estimated to be 1 in 10,000)], benefits (risk stratification, diagnosing coronary artery disease, treatment guidance) and alternatives of a nuclear stress test were discussed in detail with Ms. Fesler and she agrees to proceed.    Medication Adjustments/Labs and Tests Ordered: Current medicines are reviewed at length with the patient today.  Concerns regarding medicines are outlined above.  Orders Placed This Encounter  Procedures   CBC   Basic Metabolic Panel (BMET)   Cardiac Stress Test: Informed Consent Details: Physician/Practitioner Attestation; Transcribe to consent form and obtain patient signature   MYOCARDIAL PERFUSION IMAGING   EKG 12-Lead   No orders of the defined types were placed in this encounter.   Patient  Instructions  Medication Instructions:  Your physician recommends that you continue on your current medications as directed. Please refer to the Current Medication list given to you today.  *If you need a refill on your cardiac medications before your next appointment, please call your pharmacy*   Lab Work: CBC and BMP If you have labs (blood work) drawn today and your tests are completely normal, you will receive your results only by: [place] (if you have MyChart) OR A paper copy in the mail If you have any lab test that is abnormal or we need to change your treatment, we will call you to review the results.   Testing/Procedures: Your physician has requested that you have a lexiscan myoview. For further information please visit [email or web address]. Please follow instruction sheet, as given.   The test will take approximately 3 to 4 hours to complete; you may bring reading material.  If someone comes with you to your appointment, they will need to remain in the main lobby due to limited space in the testing area. **If you are pregnant or breastfeeding, please notify the nuclear lab prior to your appointment**  How to prepare for your Myocardial Perfusion Test:  Do not eat or drink 3 hours prior to your test, except you may have water. Do not consume products containing caffeine (regular or decaffeinated) 12 hours prior to your test. (ex: coffee, chocolate, sodas, tea). Do bring a list of your current medications with you.  If not listed below, you may take your medications as normal. Do wear comfortable clothes (no dresses or overalls) and walking shoes, tennis shoes preferred (No heels or open toe shoes are allowed). Do NOT wear cologne, perfume, aftershave, or lotions (deodorant is allowed). If these instructions are not followed, your test will have to be rescheduled.    Follow-Up: At [facility], you and your health needs are our priority.  As part of our continuing  mission to provide you with exceptional heart care, we have created designated Provider Care Teams.  These Care Teams include your primary Cardiologist (physician) and Advanced Practice Providers (APPs -  Physician Assistants and Nurse Practitioners) who all work together to provide you with the care you need, when you need it.  We recommend signing up for the patient portal called "MyChart".  Sign up information is provided on this After Visit Summary.  MyChart is used to connect with patients for Virtual Visits (Telemedicine).  Patients are able to view lab/test results, encounter notes, upcoming appointments, etc.  Non-urgent messages can be sent to your provider as well.   To learn more about what you can do with MyChart, go to [email or web address].    Your next appointment:   6 week(s)  The format for your next appointment:   In Person  Provider:   [name], PA-C        Signed, [name], [place]  03/09/2022 11:55 PM    [place]

## 2022-03-09 ENCOUNTER — Encounter: Payer: Self-pay | Admitting: Physician Assistant

## 2022-03-14 ENCOUNTER — Telehealth (HOSPITAL_COMMUNITY): Payer: Self-pay | Admitting: *Deleted

## 2022-03-14 NOTE — Telephone Encounter (Signed)
Close encounter 

## 2022-03-15 ENCOUNTER — Ambulatory Visit (HOSPITAL_COMMUNITY)
Admission: RE | Admit: 2022-03-15 | Discharge: 2022-03-15 | Disposition: A | Discharge status: Home / Self Care | Payer: Medicare Other | Source: Ambulatory Visit | Attending: Cardiology | Admitting: Cardiology

## 2022-03-15 DIAGNOSIS — R072 Precordial pain: Secondary | ICD-10-CM | POA: Diagnosis present

## 2022-03-15 MED ORDER — REGADENOSON 0.4 MG/5ML IV SOLN
0.4000 mg | Freq: Once | INTRAVENOUS | Status: AC
  Administered 2022-03-15: 0.4 mg via INTRAVENOUS

## 2022-03-15 MED ORDER — TECHNETIUM TC 99M TETROFOSMIN IV KIT
10.0000 | PACK | Freq: Once | INTRAVENOUS | Status: AC | PRN
  Administered 2022-03-15: 10 via INTRAVENOUS

## 2022-03-15 MED ORDER — TECHNETIUM TC 99M TETROFOSMIN IV KIT
32.1000 | PACK | Freq: Once | INTRAVENOUS | Status: AC | PRN
Start: 2022-03-15 — End: 2022-03-15
  Administered 2022-03-15: 32.1 via INTRAVENOUS

## 2022-03-16 LAB — MYOCARDIAL PERFUSION IMAGING
LV dias vol: 108 mL (ref 46–106)
LV sys vol: 52 mL
Nuc Stress EF: 52 %
Peak HR: 82 {beats}/min
Rest HR: 55 {beats}/min
Rest Nuclear Isotope Dose: 10 mCi
SDS: 3
SRS: 3
SSS: 6
ST Depression (mm): 0 mm
Stress Nuclear Isotope Dose: 32.1 mCi
TID: 1.14

## 2022-03-20 ENCOUNTER — Telehealth: Payer: Self-pay | Admitting: Gastroenterology

## 2022-03-20 ENCOUNTER — Telehealth: Payer: Self-pay | Admitting: Physician Assistant

## 2022-03-20 ENCOUNTER — Other Ambulatory Visit: Payer: Self-pay | Admitting: Physician Assistant

## 2022-03-20 MED ORDER — ISOSORBIDE MONONITRATE ER 30 MG PO TB24: 30.0000 mg | ORAL_TABLET | Freq: Every day | ORAL | 11 refills | Status: DC

## 2022-03-20 MED ORDER — NITROGLYCERIN 0.4 MG SL SUBL
0.4000 mg | SUBLINGUAL_TABLET | SUBLINGUAL | 2 refills | Status: DC | PRN
Start: 2022-03-20 — End: 2023-06-26

## 2022-03-20 NOTE — Telephone Encounter (Signed)
Inbound call from patient stating that she is having issues with stomach ulcers and is requesting  a call back to discuss. Please advise.

## 2022-03-20 NOTE — Telephone Encounter (Signed)
I spoke with the patient regarding the stress test result.  Unfortunately the stress test result is ambiguous.  Talking with the patient, she is still having occasional chest pain and shortness of breath.  She has a history of jejunal ulcer, unclear if this is cardiac versus GI in nature.  I will bring the patient back next Wednesday to discuss that the next course of option.  I have started the patient on 30 mg daily of Imdur and refilled her sublingual nitroglycerin.  I have instructed the patient to go to the emergency room if her chest pain worsens in intensity.

## 2022-03-20 NOTE — Telephone Encounter (Signed)
Left message on machine to call back  

## 2022-03-21 NOTE — Telephone Encounter (Signed)
Patient returned your call. Requesting a call back before 1 pm.

## 2022-03-21 NOTE — Telephone Encounter (Signed)
Patient returning your call.

## 2022-03-21 NOTE — Telephone Encounter (Signed)
Left message on machine to call back  

## 2022-03-22 NOTE — Telephone Encounter (Signed)
The pt has a history of gastric ulcers earlier this year.  She has recently developed bloating and fatigue as well as dizziness. She has not seen any bleeding, however she is on iron daily and has black stools.  She saw her PCP and will be having labs today.  She has been scheduled to see Ellouise Newer PA for tomorrow at 10 AM.

## 2022-03-23 ENCOUNTER — Encounter: Payer: Self-pay | Admitting: Physician Assistant

## 2022-03-23 ENCOUNTER — Ambulatory Visit (INDEPENDENT_AMBULATORY_CARE_PROVIDER_SITE_OTHER): Payer: Medicare Other | Admitting: Physician Assistant

## 2022-03-23 ENCOUNTER — Other Ambulatory Visit (INDEPENDENT_AMBULATORY_CARE_PROVIDER_SITE_OTHER): Payer: Medicare Other

## 2022-03-23 ENCOUNTER — Telehealth: Payer: Self-pay

## 2022-03-23 VITALS — BP 120/68 | HR 70 | Ht 67.0 in | Wt 211.0 lb

## 2022-03-23 DIAGNOSIS — Z8711 Personal history of peptic ulcer disease: Secondary | ICD-10-CM

## 2022-03-23 DIAGNOSIS — R1013 Epigastric pain: Secondary | ICD-10-CM

## 2022-03-23 DIAGNOSIS — E611 Iron deficiency: Secondary | ICD-10-CM | POA: Diagnosis not present

## 2022-03-23 DIAGNOSIS — R5383 Other fatigue: Secondary | ICD-10-CM

## 2022-03-23 DIAGNOSIS — I208 Other forms of angina pectoris: Secondary | ICD-10-CM

## 2022-03-23 LAB — BASIC METABOLIC PANEL
BUN/Creatinine Ratio: 21 (ref 12–28)
BUN: 17 mg/dL (ref 8–27)
CO2: 21 mmol/L (ref 20–29)
Calcium: 9.5 mg/dL (ref 8.7–10.3)
Chloride: 104 mmol/L (ref 96–106)
Creatinine, Ser: 0.82 mg/dL (ref 0.57–1.00)
Glucose: 153 mg/dL — ABNORMAL HIGH (ref 70–99)
Potassium: 3.7 mmol/L (ref 3.5–5.2)
Sodium: 141 mmol/L (ref 134–144)
eGFR: 80 mL/min/{1.73_m2} (ref >59)

## 2022-03-23 LAB — CBC
Hematocrit: 37.8 % (ref 34.0–46.6)
Hemoglobin: 13.3 g/dL (ref 11.1–15.9)
MCH: 32.4 pg (ref 26.6–33.0)
MCHC: 35.2 g/dL (ref 31.5–35.7)
MCV: 92 fL (ref 79–97)
Platelets: 158 10*3/uL (ref 150–450)
RBC: 4.11 x10E6/uL (ref 3.77–5.28)
RDW: 13 % (ref 11.7–15.4)
WBC: 4.3 10*3/uL (ref 3.4–10.8)

## 2022-03-23 LAB — IBC + FERRITIN
Ferritin: 51.3 ng/mL (ref 10.0–291.0)
Iron: 104 ug/dL (ref 42–145)
Saturation Ratios: 30.7 % (ref 20.0–50.0)
TIBC: 338.8 ug/dL (ref 250.0–450.0)
Transferrin: 242 mg/dL (ref 212.0–360.0)

## 2022-03-23 LAB — TSH: TSH: 1.79 u[IU]/mL (ref 0.35–5.50)

## 2022-03-23 MED ORDER — FAMOTIDINE 40 MG PO TABS: 40.0000 mg | ORAL_TABLET | Freq: Two times a day (BID) | ORAL | 3 refills | Status: DC

## 2022-03-23 MED ORDER — AMBULATORY NON FORMULARY MEDICATION
0 refills | Status: DC
Start: 2022-03-23 — End: 2022-03-29

## 2022-03-23 NOTE — Progress Notes (Signed)
Chief Complaint: Epigastric pain  HPI:    Carrie Reynolds is a 66 year old female with a past medical history as listed below including CAD (status post PCI on Plavix), Arnold-Chiari malformation of brain, MDD, anxiety, OSA, status postcholecystectomy, GERD, Barrett's esophagus, status post gastric bypass with gastrojejunal anastomosis and previous ulcers, known to Dr. Rush Landmark, who presents to clinic today for complaint of epigastric pain.    10/24/21 EGD and colonoscopy with Dr. Rush Landmark.  Colonoscopy with six 4 to 15 mm polyps of rectosigmoid colon, sigmoid colon, transverse colon and ascending colon.  130 mm polyp in the descending colon removed piecemeal with clips placed and diverticulosis in the rectosigmoid colon and sigmoid colon as well as nonbleeding nonthrombosed external and internal hemorrhoids.  Patient was told to repeat colonoscopy in a year given large polyp resection.  Pathology showed sessile serrated polyps.  EGD done for follow-up of Barrett's and follow-up of gastrojejunal ulcer with hemorrhage.  Gastrojejunostomy was found characterized by healthy-appearing mucosa, previous ulcers that healed, continue on Protonix twice a day and told to decrease Carafate to twice daily for the next month and then stop.  Repeat EGD recommended 3 years for surveillance of Barrett's.    03/20/2022 patient called and described "trouble with ulcers".  She described bloating, fatigue and dizziness.    03/22/2022 CBC with a normal hemoglobin.  BMP normal with elevated glucose.    Today, patient tells me that for the past 3 weeks she has had epigastric pain that is only better if she has food in her stomach.  Due to this she has been eating constantly and has gained 10 pounds.  Tells me that she increased her Carafate back to 4 times a day about 4 days ago now but it has not made much difference yet and continues her Pantoprazole 40 mg twice a day.  She is taking her Pantoprazole and Carafate together.  She  is worried that she has ulcers coming back.  Also describes ongoing fatigue and is following with cardiology about some of that as well as dizziness.  Continues on daily iron supplement.    Denies fever, chills, weight loss, change in stool coloration or symptoms that awaken her from sleep.     Past Medical History:  Diagnosis Date   Anxiety    Arnold-Chiari malformation ([place]) 2006   CAD S/P PCI 05/06/2019   05/06/2019: prox LAD (@ SPI) PCI with Resolute Onyx DES 3.5  x 8, Residual 60% PDA, Normal LVF; 07/02/2019: relook Cath for Anterior Ischemia on [place] --> CATH 70% pre-stent/proximal edge dissection --> proximal overlapping DES (Resolute Onyx 3.5 x 8 - new & old stent post-dilated to 3.8 mm)   Cervical strain    syndrome   Cervicogenic headache    Chronic kidney disease    Concussion with loss of consciousness 10/09/2017   CTS (carpal tunnel syndrome)    Depression    GERD (gastroesophageal reflux disease)    Glucose intolerance (impaired glucose tolerance)    High coronary artery calcium score    Score of 1460.  Coronary CTA shows high-grade proximal-mid LAD stenosis (CT FFR 0.50).  Also PDA/PLA 60 to 70% stenosis (CTO FFR 0.75)   History of kidney stones    Hypertension    Migraine headache    with visual aura   Obesity    OSA (obstructive sleep apnea)    not treated   Postmenopausal    Sleep apnea    Vertigo    post concussive  Past Surgical History:  Procedure Laterality Date   BIOPSY  07/15/2021   Procedure: BIOPSY;  Surgeon: Mansouraty, Telford Nab., MD;  Location: [facility];  Service: Gastroenterology;;   BUNIONECTOMY     right great toe   CHOLECYSTECTOMY N/A 06/05/2018   Procedure: LAPAROSCOPIC CHOLECYSTECTOMY WITH POSSIBLE INTRAOPERATIVE CHOLANGIOGRAM;  Surgeon: Erroll Luna, MD;  Location: [facility];  Service: General;  Laterality: N/A;   COLOSTOMY     CORONARY STENT INTERVENTION N/A 05/12/2019   Procedure: CORONARY STENT INTERVENTION;  Surgeon: Leonie Man, MD;  Location: [facility];  Service: Cardiovascular;;; Culprit-p-mLAD 95% 950% SP1) --> Scoring Balloon PTCA -> DES PCI (Resolute DES 3.5 x 38 - 3.8 mm; 0% LAD& 55% SP1). -->  SP1 was somewhat jailed with TIMI II flow post PCI.   CORONARY STENT INTERVENTION N/A 07/02/2019   Procedure: CORONARY STENT INTERVENTION;  Surgeon: Leonie Man, MD;  Location: [facility];  Service: Cardiovascular;; * 70% Proximal stent edge dissection (edge of previous stent that is patent) -->  successfully covered with additional RESOLUTE ONYX DES 3.5 mm x 8 mm overlapping proximal edge of previous stent (postdilated to 3.8 mm)   ENTEROSCOPY N/A 07/15/2021   Procedure: ENTEROSCOPY;  Surgeon: Rush Landmark Telford Nab., MD;  Location: [facility];  Service: Gastroenterology;  Laterality: N/A;   GASTRIC BYPASS  11/2015   [facility]   GYNECOLOGIC CRYOSURGERY     HAND SURGERY     LEFT HEART CATH AND CORONARY ANGIOGRAPHY N/A 05/12/2019   Procedure: LEFT HEART CATH AND CORONARY ANGIOGRAPHY;  Surgeon: Leonie Man, MD;  Location: [facility];  Service: Cardiovascular;;; Culprit-p-mLAD 95% 950% SP1) --> DES PCI.;  RPAV 60% -small caliber vessel, did not appear flow-limiting (medical management).  EF 55 to 60%.  Normal LVEDP.   LEFT HEART CATH AND CORONARY ANGIOGRAPHY N/A 07/02/2019   Procedure: LEFT HEART CATH AND CORONARY ANGIOGRAPHY;  Surgeon: Leonie Man, MD;  Location: [facility];  Service: Cardiovascular;; CULPRIT = ~70% proximal edge dissection of previous stent (DES PCI overlapping stent placed) - jails SP1 w/ ~60-70% ostial stenosis.[name] mLAD 25%. RI 30%, RPAV 60%.   NM MYOVIEW LTD  06/24/2019   Carlton Adam): HIGH RISK.  EF 55%.  Large size, moderate severe defect in the anterior wall with associated anterior hypokinesis.  Suspect LAD territory.   NM MYOVIEW LTD  10/23/2019   Normal EF 55 to 60%.  No EKG changes.  Small size mild severity fixed defect in the apical wall consistent  with breast attenuation versus small prior infarct.  Marked improvement from anterior apical perfusion abnormality seen in February 2020.   TRANSTHORACIC ECHOCARDIOGRAM  12/08/2020   Normal LV Fxn - EF 60-65%. No RWMA. Mod Asymmetric Basal Septa LVH with mild Concentric LVH. Gr II DD. Normal Longitudinal Strain. Normal RV size & fxn - normal RAP/CVP.  Normal MV. Mild AoV sclerosis w/ stenosis. Mild Ascending Ao dilation - ~38 mm (borderline).  => No change from 04/2017.   TUBAL LIGATION Bilateral    UPPER GASTROINTESTINAL ENDOSCOPY     VAGINAL HYSTERECTOMY  2003   with LSO and right salpingectomy; right ovary still present   [facility] MONITOR-14-day  01/2021   Predominant rhythm sinus-heart rate range 40 to 124 bpm.  Average 70 bpm.  Rare PVCs and isolated PACs noted.  PAC couplets and triplets also noted as well as trigeminy.  3 atrial runs of 4-5 beats ranging from 102 to 150 bpm.  (2 of  3 noted on patient trigger.  Remainder patient triggers were sinus rhythm with PACs or PVCs); no atrial fibrillation or other sustained arrhythmias noted.    Current Outpatient Medications  Medication Sig Dispense Refill   cetirizine (ZYRTEC) 10 MG tablet Take 1 tablet by mouth at bedtime.     clopidogrel (PLAVIX) 75 MG tablet TAKE 1 TABLET BY MOUTH EVERY DAY (Patient taking differently: Take 75 mg by mouth daily.) 90 tablet 3   diclofenac Sodium (VOLTAREN) 1 % GEL APPLY 2 GRAMS TO AFFECTED AREA TWICE A DAY     ferrous sulfate 325 (65 FE) MG tablet Take 1 tablet (325 mg total) by mouth daily with breakfast. 30 tablet 2   isosorbide mononitrate (IMDUR) 30 MG 24 hr tablet Take 1 tablet (30 mg total) by mouth daily. 30 tablet 11   losartan-hydrochlorothiazide (HYZAAR) 100-12.5 MG tablet Take 0.5 tablets by mouth daily. 30 tablet 11   nitroGLYCERIN (NITROSTAT) 0.4 MG SL tablet Place 1 tablet (0.4 mg total) under the tongue every 5 (five) minutes as needed. 25 tablet 2   pantoprazole (PROTONIX) 40 MG tablet Take 1  tablet (40 mg total) by mouth 2 (two) times daily. 60 tablet 3   propranolol (INDERAL) 10 MG tablet May take  1 to 2  tablets a day  as needed for palpiptation 30 tablet 11   rosuvastatin (CRESTOR) 40 MG tablet Take 40 mg by mouth daily.     sertraline (ZOLOFT) 50 MG tablet Take 50 mg by mouth in the morning and at bedtime.     sucralfate (CARAFATE) 1 g tablet TAKE 1 TABLET (1 G TOTAL) BY MOUTH 4 TIMES A DAY WITH MEALS AND AT BEDTIME (Patient not taking: Reported on 03/07/2022) 148 tablet 1   No current facility-administered medications for this visit.    Allergies as of 03/23/2022 - Review Complete 03/15/2022  Allergen Reaction Noted   Ceclor [cefaclor] Hives and Rash 06/12/2010   Tetracyclines & related Hives 07/29/2013   Lipitor [atorvastatin calcium] Cough 10/07/2011   Penicillins Swelling and Other (See Comments) 06/12/2010   Amoxicillin Rash 06/12/2010   Ampicillin Rash 06/12/2010   Lisinopril Cough 04/25/2012   Simvastatin Cough 09/22/2011    Family History  Problem Relation Age of Onset   Hypertension Mother    Lung cancer Father    Alzheimer's disease Father    Migraines Father    Hyperlipidemia Sister    Hypertension Sister    Colon cancer Maternal Grandmother    Heart failure Maternal Grandfather    Alzheimer's disease Paternal Grandmother    Lung cancer Paternal Grandfather    Esophageal cancer Neg Hx    Inflammatory bowel disease Neg Hx    Liver disease Neg Hx    Pancreatic cancer Neg Hx    Rectal cancer Neg Hx    Stomach cancer Neg Hx     Social History   Socioeconomic History   Marital status: Married    Spouse name: Caitlen Worth   Number of children: 2   Years of education: 12   Highest education level: High school graduate  Occupational History   Occupation: Retired Best boy: [employer]    Comment: Logistics, warehouse  Tobacco Use   Smoking status: Former    Packs/day: 0.10    Years: 30.00    Total pack years: 3.00     Types: Cigarettes    Quit date: 04/26/2012    Years since quitting: 9.9   Smokeless tobacco: Never  Vaping Use   Vaping Use: Never used  Substance and Sexual Activity   Alcohol use: Yes    Comment: occasional   Drug use: Yes    Types: Marijuana    Comment: weed juice   Sexual activity: Yes    Partners: Male    Birth control/protection: Surgical    Comment: hysterectomy  Other Topics Concern   Not on file  Social History Narrative   ** Merged History Encounter **       Lives with husband [name]). She travels very frequently with her job and is gone most weeks out of the month. She is active and exercises when able.   Social Determinants of Health   Financial Resource Strain: Low Risk  (05/28/2019)   Overall Financial Resource Strain (CARDIA)    Difficulty of Paying Living Expenses: Not hard at all  Food Insecurity: No Food Insecurity (05/28/2019)   Hunger Vital Sign    Worried About Running Out of Food in the Last Year: Never true    Ran Out of Food in the Last Year: Never true  Transportation Needs: No Transportation Needs (05/28/2019)   PRAPARE - Hydrologist (Medical): No    Lack of Transportation (Non-Medical): No  Physical Activity: Inactive (05/28/2019)   Exercise Vital Sign    Days of Exercise per Week: 0 days    Minutes of Exercise per Session: 0 min  Stress: Stress Concern Present (05/28/2019)   [place]    Feeling of Stress : To some extent  Social Connections: Not on file  Intimate Partner Violence: Not on file    Review of Systems:    Constitutional: No weight loss, fever or chills Cardiovascular: No chest pain Respiratory: No SOB  Gastrointestinal: See HPI and otherwise negative   Physical Exam:  Vital signs: BP 120/68   Pulse 70   Ht '5\' 7"'$  (1.702 m)   Wt 211 lb (95.7 kg)   LMP 07/24/2003 (Within Months)   SpO2 98%   BMI 33.05 kg/m    Constitutional:    Very Pleasant Caucasian female appears to be in NAD, Well developed, Well nourished, alert and cooperative Respiratory: Respirations even and unlabored. Lungs clear to auscultation bilaterally.   No wheezes, crackles, or rhonchi.  Cardiovascular: Normal S1, S2. No MRG. Regular rate and rhythm. No peripheral edema, cyanosis or pallor.  Gastrointestinal:  Soft, nondistended, mild-moderate epigastric ttp, No rebound or guarding. Normal bowel sounds. No appreciable masses or hepatomegaly. Rectal:  Not performed.  Psychiatric: Oriented to person, place and time. Demonstrates good judgement and reason without abnormal affect or behaviors.  RELEVANT LABS AND IMAGING: CBC    Component Value Date/Time   WBC 4.3 03/22/2022 1110   WBC 4.5 09/06/2021 1223   RBC 4.11 03/22/2022 1110   RBC 4.58 09/06/2021 1223   HGB 13.3 03/22/2022 1110   HCT 37.8 03/22/2022 1110   PLT 158 03/22/2022 1110   MCV 92 03/22/2022 1110   MCH 32.4 03/22/2022 1110   MCH 30.3 07/16/2021 1258   MCHC 35.2 03/22/2022 1110   MCHC 32.9 09/06/2021 1223   RDW 13.0 03/22/2022 1110   LYMPHSABS 1.4 08/15/2021 1322   MONOABS 0.3 08/15/2021 1322   EOSABS 0.1 08/15/2021 1322   BASOSABS 0.0 08/15/2021 1322    CMP     Component Value Date/Time   NA 141 03/22/2022 1110   K 3.7 03/22/2022 1110   CL 104 03/22/2022 1110  CO2 21 03/22/2022 1110   GLUCOSE 153 (H) 03/22/2022 1110   GLUCOSE 89 09/06/2021 1223   BUN 17 03/22/2022 1110   CREATININE 0.82 03/22/2022 1110   CREATININE 0.81 09/17/2012 1711   CALCIUM 9.5 03/22/2022 1110   PROT 8.0 09/06/2021 1223   PROT 7.0 06/24/2020 1152   ALBUMIN 4.6 09/06/2021 1223   ALBUMIN 4.3 06/24/2020 1152   AST 15 09/06/2021 1223   ALT 14 09/06/2021 1223   ALKPHOS 62 09/06/2021 1223   BILITOT 0.6 09/06/2021 1223   BILITOT 0.5 06/24/2020 1152   GFRNONAA >60 07/16/2021 0552   GFRAA 87 06/24/2020 1152    Assessment: 1.  Epigastric pain: Over the past 3 weeks, only better when she has a  full stomach, history of ulcers as below; concern for ulcers versus gastritis versus other 2.  History of anastomotic and jejunal ulcers: Recent endoscopy in April with no further signs of ulceration, but now with increased pain as above, recent BMP and CBC yesterday normal and no sign of GI blood loss at this time, though patient is fatigued, work-up by cardiology unrevealing including recent stress test  Plan: 1.  Patient should continue her Pantoprazole 40 mg twice a day, 30-60 minutes before breakfast and dinner.  She has enough of this medication at home. 2.  Discussed timing of Carafate.  She needs to take this an hour before or 2 hours after other medications.  It is likely blocking the absorption of Pantoprazole at this point in time with the way she is taking it.  She should continue this 4 times a day if she is able to fit it in with time constraints.  She has enough of this at home. 3.  Added Pepcid 40 mg twice a day, every morning and nightly #60 with 5 refills. 4.  Also prescribed GI cocktail 5-10 mL every 4-6 hours as needed for breakthrough pain. 5.  Ordered iron studies and TSH today given ongoing fatigue. 6.  Discussed labs which are reassuring, they do not show any sign of anemia. 7.  Scheduled the patient for an EGD on 9/12 with Dr. Rush Landmark in the [facility], if patient's symptoms clear up before then, patient will call and cancel procedure.  Did provide the patient a detailed list of risks for the procedure and she agrees to proceed. Patient is appropriate for endoscopic procedure(s) in the ambulatory ([place]) setting. 8.  Patient is advised to hold her Plavix for 5 days prior to time of procedure.  We will communicate with her cardiologist to ensure this is acceptable for her. 9.  Patient will call if any of her symptoms increase/worsen, at that point recommend she go ahead to the ER. 10.  Patient to follow in clinic per recommendations after procedure above.   Ellouise Newer,  PA-C [facility] 03/23/2022, 9:53 AM  Cc: Leonard Downing, *

## 2022-03-23 NOTE — Progress Notes (Signed)
Attending Physician's Attestation   I have reviewed the chart.   I agree with the Advanced Practitioner's note, impression, and recommendations with any updates as below.    Tremane Spurgeon Mansouraty, MD [facility] Advanced Endoscopy Office # [phone]  

## 2022-03-23 NOTE — Telephone Encounter (Signed)
Please delay GI procedure until further cardiology evaluation given possible abnormal nuclear stress test.  The patient is scheduled to see me next week

## 2022-03-23 NOTE — Patient Instructions (Addendum)
If you are age 65 or older, your body mass index should be between 23-30. Your Body mass index is 33.05 kg/m. If this is out of the aforementioned range listed, please consider follow up with your Primary Care Provider.  If you are age 46 or younger, your body mass index should be between 19-25. Your Body mass index is 33.05 kg/m. If this is out of the aformentioned range listed, please consider follow up with your Primary Care Provider.   __________________________________________________________  The [place] [facility] providers would like to encourage you to use [facility] to communicate with providers for non-urgent requests or questions.  Due to long hold times on the telephone, sending your provider a message by [facility] may be a faster and more efficient way to get a response.  Please allow 48 business hours for a response.  Please remember that this is for non-urgent requests.   Due to recent changes in healthcare laws, you may see the results of your imaging and laboratory studies on MyChart before your provider has had a chance to review them.  We understand that in some cases there may be results that are confusing or concerning to you. Not all laboratory results come back in the same time frame and the provider may be waiting for multiple results in order to interpret others.  Please give [place] 48 hours in order for your provider to thoroughly review all the results before contacting the office for clarification of your results.    Your provider has requested that you go to the basement level for lab work before leaving today. Press "B" on the elevator. The lab is located at the first door on the left as you exit the elevator.  We have sent the following medications to your pharmacy for you to pick up at your convenience: GI Cocktail, Pepcid  Remember to take carafate 1 hr before or 2 hrs. After other meds.  You have been scheduled for an endoscopy. Please follow written instructions given to you at  your visit today. If you use inhalers (even only as needed), please bring them with you on the day of your procedure.  You will be contaced by our office prior to your procedure for directions on holding your Plavix.  If you do not hear from our office 1 week prior to your scheduled procedure, please call [phone] to discuss.   Follow up appointment as needed.   Thank you for choosing me and [facility].

## 2022-03-23 NOTE — Telephone Encounter (Signed)
[facility] Pre-operative Risk Assessment     Request for surgical clearance:   Endoscopy Procedure  What type of surgery is being performed?     Endoscopy  When is this surgery scheduled?     04/03/22  What type of clearance is required ?   Pharmacy  Are there any medications that need to be held prior to surgery and how long? Plavix for 5 days  Practice name and name of physician performing surgery?  [facility], Dr. Rush Landmark   What is your office phone and fax number?      Phone- [phone]  [phone]  Anesthesia type (None, local, MAC, general) ?       MAC

## 2022-03-28 ENCOUNTER — Ambulatory Visit: Payer: Medicare Other | Attending: Physician Assistant | Admitting: Physician Assistant

## 2022-03-28 ENCOUNTER — Encounter: Payer: Self-pay | Admitting: Physician Assistant

## 2022-03-28 ENCOUNTER — Telehealth: Payer: Self-pay | Admitting: Physician Assistant

## 2022-03-28 VITALS — BP 128/82 | HR 66 | Ht 67.0 in | Wt 209.0 lb

## 2022-03-28 DIAGNOSIS — E785 Hyperlipidemia, unspecified: Secondary | ICD-10-CM | POA: Insufficient documentation

## 2022-03-28 DIAGNOSIS — I25119 Atherosclerotic heart disease of native coronary artery with unspecified angina pectoris: Secondary | ICD-10-CM

## 2022-03-28 DIAGNOSIS — I10 Essential (primary) hypertension: Secondary | ICD-10-CM

## 2022-03-28 DIAGNOSIS — R0789 Other chest pain: Secondary | ICD-10-CM

## 2022-03-28 DIAGNOSIS — I208 Other forms of angina pectoris: Secondary | ICD-10-CM | POA: Diagnosis not present

## 2022-03-28 NOTE — Progress Notes (Signed)
Cardiology Office Note:    Date:  03/29/2022   ID:  Carrie Reynolds, DOB 01/14/1956, MRN [id number]  PCP:  Leonard Downing, MD   [facility] Cardiologist:  [name], MD     Referring MD: Leonard Downing, *   Chief Complaint  Patient presents with   Follow-up    Seen for Dr. Ellyn Hack    History of Present Illness:    Carrie Reynolds is a 66 y.o. female with a hx of  CAD s/p DESx 2 to LAD, sinus bradycardia, hypertension, hyperlipidemia, and OSA.  Cardiac catheterization performed on 05/12/2019 following abnormal coronary CT revealed 95% proximal to mid LAD lesion treated with Resolute Onyx 3.5 x 8 mm DES, 50% lesion in the sidebranch in first septal perforator.  Due to recurrent chest pain and abnormal Myoview, patient underwent cardiac catheterization again on 07/02/2019 that revealed EF 55 to 65%, widely patent proximal to mid LAD stent with 70% stenosis proximal to the stent edge due to likely dissection, this was treated with Resolute Onyx 3.5 x 8 mm DES, 25% mid LAD lesion, 30% ramus lesion, 60% RPAV lesion.  She had a nuclear stress test in April 2021 and Apr 2022 [facility]) which were nonischemic.  She is no longer on beta-blocker due to sinus bradycardia.  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  Patient was admitted at [facility] due to arm pain and a near syncope in April 2022, Myoview was nonischemic.  The incidence was felt to be due to dehydration.  The diltiazem stopped as her heart rate was in the 40s.  Echocardiogram performed on 12/08/2020 showed EF 60 to 65%, grade 2 DD, trivial MR.  Patient was last seen by Dr. Ellyn Hack on 08/21/2021 at which time he was doing well without further chest discomfort.  I last saw the patient on 03/07/2022 due to episode of chest pain.  Symptom lasted 1-1/2-minute before going away.  This prompted her to take a nitroglycerin.  I ordered a nuclear stress test  that showed EF 52%, electrically negative for ischemia, however thinning with decreased tracer activity in the anterior and anteroseptal wall in recovery images, this is consistent either with ischemia or possible breast attenuation artifact.  Talking with the patient today, she continued to have intermittent chest discomfort.  EKG today is unchanged.  Interestingly, her chest pain is worse with deep inspiration and improved with food.  The chest discomfort sounds more GI rather than cardiac.  I reviewed her nuclear stress test with Dr. Ellyn Hack, there is clearly anterior wall perfusion abnormality on the recent nuclear stress test.  Unfortunately the first day Dr. Ellyn Hack is back in the Cath Lab is on 9/12, which is the day she is going to go for endoscopy procedure.  Dr. Ellyn Hack recommend go ahead and schedule a earlier cardiac catheterization, if cardiac catheterization shows no culprit lesion, then she is cleared to proceed with GI work-up.  Risk and the benefit of the procedure has been discussed with the patient who is agreeable to proceed.  Past Medical History:  Diagnosis Date   Anxiety    Arnold-Chiari malformation ([place]) 2006   CAD S/P PCI 05/06/2019   05/06/2019: prox LAD (@ SPI) PCI with Resolute Onyx DES 3.5  x 8, Residual 60% PDA, Normal LVF; 07/02/2019: relook Cath for Anterior Ischemia on Mhoview --> CATH 70% pre-stent/proximal edge dissection --> proximal overlapping DES (Resolute Onyx 3.5 x  8 - new & old stent post-dilated to 3.8 mm)   Cervical strain    syndrome   Cervicogenic headache    Chronic kidney disease    Concussion with loss of consciousness 10/09/2017   CTS (carpal tunnel syndrome)    Depression    GERD (gastroesophageal reflux disease)    Glucose intolerance (impaired glucose tolerance)    High coronary artery calcium score    Score of 1460.  Coronary CTA shows high-grade proximal-mid LAD stenosis (CT FFR 0.50).  Also PDA/PLA 60 to 70% stenosis (CTO FFR 0.75)    History of kidney stones    Hypertension    Migraine headache    with visual aura   Obesity    OSA (obstructive sleep apnea)    not treated   Postmenopausal    Sleep apnea    Vertigo    post concussive    Past Surgical History:  Procedure Laterality Date   BIOPSY  07/15/2021   Procedure: BIOPSY;  Surgeon: Rush Landmark Telford Nab., MD;  Location: [place];  Service: Gastroenterology;;   Lillard Anes     right great toe   CHOLECYSTECTOMY N/A 06/05/2018   Procedure: LAPAROSCOPIC CHOLECYSTECTOMY WITH POSSIBLE INTRAOPERATIVE CHOLANGIOGRAM;  Surgeon: Erroll Luna, MD;  Location: [place];  Service: General;  Laterality: N/A;   COLOSTOMY     CORONARY STENT INTERVENTION N/A 05/12/2019   Procedure: CORONARY STENT INTERVENTION;  Surgeon: Leonie Man, MD;  Location: [place] [facility];  Service: Cardiovascular;;; Culprit-p-mLAD 95% 950% SP1) --> Scoring Balloon PTCA -> DES PCI (Resolute DES 3.5 x 38 - 3.8 mm; 0% LAD& 55% SP1). -->  SP1 was somewhat jailed with TIMI II flow post PCI.   CORONARY STENT INTERVENTION N/A 07/02/2019   Procedure: CORONARY STENT INTERVENTION;  Surgeon: Leonie Man, MD;  Location: [place] [facility];  Service: Cardiovascular;; * 70% Proximal stent edge dissection (edge of previous stent that is patent) -->  successfully covered with additional RESOLUTE ONYX DES 3.5 mm x 8 mm overlapping proximal edge of previous stent (postdilated to 3.8 mm)   ENTEROSCOPY N/A 07/15/2021   Procedure: ENTEROSCOPY;  Surgeon: Rush Landmark Telford Nab., MD;  Location: [place];  Service: Gastroenterology;  Laterality: N/A;   GASTRIC BYPASS  11/2015   [facility]   GYNECOLOGIC CRYOSURGERY     HAND SURGERY     LEFT HEART CATH AND CORONARY ANGIOGRAPHY N/A 05/12/2019   Procedure: LEFT HEART CATH AND CORONARY ANGIOGRAPHY;  Surgeon: Leonie Man, MD;  Location: [place] [facility];  Service: Cardiovascular;;; Culprit-p-mLAD 95% 950% SP1) --> DES PCI.;  RPAV 60% -small caliber vessel,  did not appear flow-limiting (medical management).  EF 55 to 60%.  Normal LVEDP.   LEFT HEART CATH AND CORONARY ANGIOGRAPHY N/A 07/02/2019   Procedure: LEFT HEART CATH AND CORONARY ANGIOGRAPHY;  Surgeon: Leonie Man, MD;  Location: [place] [facility];  Service: Cardiovascular;; CULPRIT = ~70% proximal edge dissection of previous stent (DES PCI overlapping stent placed) - jails SP1 w/ ~60-70% ostial stenosis.[name] mLAD 25%. RI 30%, RPAV 60%.   NM MYOVIEW LTD  06/24/2019   Carlton Adam): HIGH RISK.  EF 55%.  Large size, moderate severe defect in the anterior wall with associated anterior hypokinesis.  Suspect LAD territory.   NM MYOVIEW LTD  10/23/2019   Normal EF 55 to 60%.  No EKG changes.  Small size mild severity fixed defect in the apical wall consistent with breast attenuation versus small prior infarct.  Marked improvement from anterior apical  perfusion abnormality seen in February 2020.   TRANSTHORACIC ECHOCARDIOGRAM  12/08/2020   Normal LV Fxn - EF 60-65%. No RWMA. Mod Asymmetric Basal Septa LVH with mild Concentric LVH. Gr II DD. Normal Longitudinal Strain. Normal RV size & fxn - normal RAP/CVP.  Normal MV. Mild AoV sclerosis w/ stenosis. Mild Ascending Ao dilation - ~38 mm (borderline).  => No change from 04/2017.   TUBAL LIGATION Bilateral    UPPER GASTROINTESTINAL ENDOSCOPY     VAGINAL HYSTERECTOMY  2003   with LSO and right salpingectomy; right ovary still present   ZIO A [place] MONITOR-14-day  01/2021   Predominant rhythm sinus-heart rate range 40 to 124 bpm.  Average 70 bpm.  Rare PVCs and isolated PACs noted.  PAC couplets and triplets also noted as well as trigeminy.  3 atrial runs of 4-5 beats ranging from 102 to 150 bpm.  (2 of 3 noted on patient trigger.  Remainder patient triggers were sinus rhythm with PACs or PVCs); no atrial fibrillation or other sustained arrhythmias noted.    Current Medications: Current Meds  Medication Sig   cetirizine (ZYRTEC) 10 MG tablet Take 10 mg by  mouth daily as needed for allergies.   clopidogrel (PLAVIX) 75 MG tablet TAKE 1 TABLET BY MOUTH EVERY DAY (Patient taking differently: Take 75 mg by mouth daily.)   diclofenac Sodium (VOLTAREN) 1 % GEL Apply 2 g topically daily as needed (Pain).   famotidine (PEPCID) 40 MG tablet Take 1 tablet (40 mg total) by mouth 2 (two) times daily.   hydrochlorothiazide (HYDRODIURIL) 12.5 MG tablet Take 12.5 mg by mouth daily.   isosorbide mononitrate (IMDUR) 30 MG 24 hr tablet Take 1 tablet (30 mg total) by mouth daily. (Patient not taking: Reported on 03/29/2022)   losartan-hydrochlorothiazide (HYZAAR) 100-12.5 MG tablet Take 0.5 tablets by mouth daily. (Patient taking differently: Take 1 tablet by mouth daily.)   nitroGLYCERIN (NITROSTAT) 0.4 MG SL tablet Place 1 tablet (0.4 mg total) under the tongue every 5 (five) minutes as needed.   propranolol (INDERAL) 10 MG tablet May take  1 to 2  tablets a day  as needed for palpiptation (Patient taking differently: Take 10 mg by mouth every 8 (eight) hours as needed (panic attack).)   rosuvastatin (CRESTOR) 40 MG tablet Take 40 mg by mouth daily.   sertraline (ZOLOFT) 100 MG tablet Take 100-200 mg by mouth daily as needed ([place]).   sucralfate (CARAFATE) 1 g tablet TAKE 1 TABLET (1 G TOTAL) BY MOUTH 4 TIMES A DAY WITH MEALS AND AT BEDTIME (Patient taking differently: Take 1 g by mouth 2 (two) times daily.)   [DISCONTINUED] AMBULATORY NON FORMULARY MEDICATION Medication Name: GI Cocktail 90 ml Viscous Lidocaine, 90 ML Dicyclomine 10 mg/16m, 270 ML Maalox, Total of 450 ML 5cc opr 10 cc every 4 to 6 hours as needed for Epigastric pain. (Patient not taking: Reported on 03/29/2022)   Current Facility-Administered Medications for the 03/28/22 encounter (Office Visit) with MAlmyra Deforest PA  Medication   sodium chloride flush (NS) 0.9 % injection 3 mL     Allergies:   Ceclor [cefaclor], Tetracyclines & related, Aspirin, Lipitor [atorvastatin calcium], Penicillins, Amoxicillin,  Ampicillin, Lisinopril, and Simvastatin   Social History   Socioeconomic History   Marital status: Married    Spouse name: DJariah Tarkowski  Number of children: 2   Years of education: 12   Highest education level: High school graduate  Occupational History   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 [employer]    Comment: Logistics, warehouse  Tobacco Use   Smoking status: Former    Packs/day: 0.10    Years: 30.00    Total pack years: 3.00    Types: Cigarettes    Quit date: 04/26/2012    Years since quitting: 9.9   Smokeless tobacco: Never  Vaping Use   Vaping Use: Never used  Substance and Sexual Activity   Alcohol use: Yes    Comment: occasional   Drug use: Yes    Types: Marijuana    Comment: weed juice   Sexual activity: Yes    Partners: Male    Birth control/protection: Surgical    Comment: hysterectomy  Other Topics Concern   Not on file  Social History Narrative   ** Merged History Encounter **       Lives with husband [name]). She travels very frequently with her job and is gone most weeks out of the month. She is active and exercises when able.   Social Determinants of Health   Financial Resource Strain: Low Risk  (05/28/2019)   Overall Financial Resource Strain (CARDIA)    Difficulty of Paying Living Expenses: Not hard at all  Food Insecurity: No Food Insecurity (05/28/2019)   Hunger Vital Sign    Worried About Running Out of Food in the Last Year: Never true    Ran Out of Food in the Last Year: Never true  Transportation Needs: No Transportation Needs (05/28/2019)   PRAPARE - Hydrologist (Medical): No    Lack of Transportation (Non-Medical): No  Physical Activity: Inactive (05/28/2019)   Exercise Vital Sign    Days of Exercise per Week: 0 days    Minutes of Exercise per Session: 0 min  Stress: Stress Concern Present (05/28/2019)   [place]     Feeling of Stress : To some extent  Social Connections: Not on file     Family History: The patient's family history includes Alzheimer's disease in her father and paternal grandmother; Colon cancer in her maternal grandmother; Heart failure in her maternal grandfather; Hyperlipidemia in her sister; Hypertension in her mother and sister; Lung cancer in her father and paternal grandfather; Migraines in her father. There is no history of Esophageal cancer, Inflammatory bowel disease, Liver disease, Pancreatic cancer, Rectal cancer, or Stomach cancer.  ROS:   Please see the history of present illness.     All other systems reviewed and are negative.  EKGs/Labs/Other Studies Reviewed:    The following studies were reviewed today:  Myoview 03/15/2022   Lexiscan stress is electrically negative for ischemia   Thinning with decreased tracer activity in the anterior/anteorseptal  walls (base, mid, distal) In the recovery images, there is improvement in this region Overall consistent with ischemia and possible breast attenuation. Cannot completely exclude shifting breast attenuation as thinning noted in this region previous study done in 2021.   LVEF on gating calculated at 52%   Intermediate risk study  EKG:  EKG is ordered today.  The ekg ordered today demonstrates normal sinus rhythm, no significant ST-T wave changes  Recent Labs: 09/06/2021: ALT 14 03/22/2022: BUN 17; Creatinine, Ser 0.82; Hemoglobin 13.3; Platelets 158; Potassium 3.7; Sodium 141 03/23/2022: TSH 1.79  Recent Lipid Panel    Component Value Date/Time   CHOL 138 06/24/2020 1152   TRIG 151 (H) 06/24/2020 1152   HDL 60 06/24/2020 1152   CHOLHDL 2.3 06/24/2020 1152   CHOLHDL  5.5 04/25/2012 1406   VLDL 76 (H) 04/25/2012 1406   LDLCALC 53 06/24/2020 1152     Risk Assessment/Calculations:           Physical Exam:    VS:  BP 128/82   Pulse 66   Ht '5\' 7"'$  (1.702 m)   Wt 209 lb (94.8 kg)   LMP 07/24/2003 (Within Months)    SpO2 99%   BMI 32.73 kg/m         Wt Readings from Last 3 Encounters:  03/28/22 209 lb (94.8 kg)  03/23/22 211 lb (95.7 kg)  03/15/22 206 lb (93.4 kg)     GEN:  Well nourished, well developed in no acute distress HEENT: Normal NECK: No JVD; No carotid bruits LYMPHATICS: No lymphadenopathy CARDIAC: RRR, no murmurs, rubs, gallops RESPIRATORY:  Clear to auscultation without rales, wheezing or rhonchi  ABDOMEN: Soft, non-tender, non-distended MUSCULOSKELETAL:  No edema; No deformity  SKIN: Warm and dry NEUROLOGIC:  Alert and oriented x 3 PSYCHIATRIC:  Normal affect   ASSESSMENT:    1. Atypical chest pain   2. Coronary artery disease involving native coronary artery of native heart with angina pectoris ([place])   3. Primary hypertension   4. Hyperlipidemia LDL goal <70    PLAN:    In order of problems listed above:  Atypical chest pain: Symptom is worse with deep inspiration and better with food.  She has a history of jejunal ulcer.  I previously ordered a nuclear stress test given sudden onset of chest pain, nuclear stress test came back somewhat ambiguous suggesting possible ischemia in the anterior wall.  I reviewed the nuclear stress test with Dr. Ellyn Hack who agrees there is clearly anterior wall perfusion defect.  Patient is currently scheduled for GI procedure endoscopy on 9/12 which is also the first day Dr. Ellyn Hack will be back in the Cath Lab.  Dr. Ellyn Hack recommended a diagnostic cardiac catheterization, however if no culprit lesion is identified from the cardiac perspective, she can proceed with GI work-up  CAD: Patient previously underwent DES to RCA twice in 2020.  This is the same location where she has perfusion defect on the recent nuclear stress test  Hypertension: Blood pressure stable  Hyperlipidemia: On Crestor      Shared Decision Making/Informed Consent The risks [stroke (1 in 1000), death (1 in 1000), kidney failure [usually temporary] (1 in 500), bleeding  (1 in 200), allergic reaction [possibly serious] (1 in 200)], benefits (diagnostic support and management of coronary artery disease) and alternatives of a cardiac catheterization were discussed in detail with Ms. Odaniel and she is willing to proceed.    Medication Adjustments/Labs and Tests Ordered: Current medicines are reviewed at length with the patient today.  Concerns regarding medicines are outlined above.  Orders Placed This Encounter  Procedures   EKG 12-Lead   No orders of the defined types were placed in this encounter.   Patient Instructions  Medication Instructions:  Your physician recommends that you continue on your current medications as directed. Please refer to the Current Medication list given to you today.  *If you need a refill on your cardiac medications before your next appointment, please call your pharmacy*   Lab Work: NONE ordered at this time of appointment   If you have labs (blood work) drawn today and your tests are completely normal, you will receive your results only by: [place] (if you have MyChart) OR A paper copy in the mail If you have any lab  test that is abnormal or we need to change your treatment, we will call you to review the results.   Testing/Procedures: Your physician has requested that you have a cardiac catheterization. Cardiac catheterization is used to diagnose and/or treat various heart conditions. Doctors may recommend this procedure for a number of different reasons. The most common reason is to evaluate chest pain. Chest pain can be a symptom of coronary artery disease (CAD), and cardiac catheterization can show whether plaque is narrowing or blocking your heart's arteries. This procedure is also used to evaluate the valves, as well as measure the blood flow and oxygen levels in different parts of your heart. For further information please visit HugeFiesta.[email or web address]. Please follow instruction sheet, as given.  Scheduled for  Friday 03/30/22 at 12:00 PM with Dr. Ali Lowe   Follow-Up: At [facility], you and your health needs are our priority.  As part of our continuing mission to provide you with exceptional heart care, we have created designated Provider Care Teams.  These Care Teams include your primary Cardiologist (physician) and Advanced Practice Providers (APPs -  Physician Assistants and Nurse Practitioners) who all work together to provide you with the care you need, when you need it.   Your next appointment:   4-6 week(s)  The format for your next appointment:   In Person  Provider:   [name], PA-C        Other Instructions  [place] A DEPT OF [place] A DEPT OF [place]. [facility] Center Line [id number] [place] [address] Dept: [place]: [phone]  Jerrianne Hartin  03/28/2022  You are scheduled for a Cardiac Catheterization on Friday, September 8 with Dr. Lenna Sciara.  1. Please arrive at the [facility] (Main Entrance A) at [facility]: [address] [place],  [address] at 10:00 AM (This time is two hours before your procedure to ensure your preparation). Free valet parking service is available.   Special note: Every effort is made to have your procedure done on time. Please understand that emergencies sometimes delay scheduled procedures.  2. Diet: Do not eat solid foods after midnight.  The patient may have clear liquids until 5am upon the day of the procedure.  3. Medication instructions in preparation for your procedure:   Contrast Allergy: No  Stop taking, HCTZ and Hyzaar the morning of Heart Cath  Friday, September 8,  On the morning of your procedure, take your Aspirin and any morning medicines NOT listed above.  You may use sips of water.  4. Plan for one night stay--bring personal belongings. 5. Bring a current list of your medications and  current insurance cards. 6. You MUST have a responsible person to drive you home. 7. Someone MUST be with you the first 24 hours after you arrive home or your discharge will be delayed. 8. Please wear clothes that are easy to get on and off and wear slip-on shoes.  Thank you for allowing [place] to care for you!   -- [facility] Invasive Cardiovascular services   Important Information About Sugar         Hilbert Corrigan, [place]  03/29/2022 5:03 PM    [place]

## 2022-03-28 NOTE — Telephone Encounter (Signed)
Inbound call from patient stating she was returning a call about her upcoming appointment for her EGD on 9/12. Please advise.

## 2022-03-28 NOTE — Patient Instructions (Signed)
Medication Instructions:  Your physician recommends that you continue on your current medications as directed. Please refer to the Current Medication list given to you today.  *If you need a refill on your cardiac medications before your next appointment, please call your pharmacy*   Lab Work: NONE ordered at this time of appointment   If you have labs (blood work) drawn today and your tests are completely normal, you will receive your results only by: [place] (if you have MyChart) OR A paper copy in the mail If you have any lab test that is abnormal or we need to change your treatment, we will call you to review the results.   Testing/Procedures: Your physician has requested that you have a cardiac catheterization. Cardiac catheterization is used to diagnose and/or treat various heart conditions. Doctors may recommend this procedure for a number of different reasons. The most common reason is to evaluate chest pain. Chest pain can be a symptom of coronary artery disease (CAD), and cardiac catheterization can show whether plaque is narrowing or blocking your heart's arteries. This procedure is also used to evaluate the valves, as well as measure the blood flow and oxygen levels in different parts of your heart. For further information please visit HugeFiesta.[email or web address]. Please follow instruction sheet, as given.  Scheduled for Friday 03/30/22 at 12:00 PM with Dr. Ali Lowe   Follow-Up: At [facility], you and your health needs are our priority.  As part of our continuing mission to provide you with exceptional heart care, we have created designated Provider Care Teams.  These Care Teams include your primary Cardiologist (physician) and Advanced Practice Providers (APPs -  Physician Assistants and Nurse Practitioners) who all work together to provide you with the care you need, when you need it.   Your next appointment:   4-6 week(s)  The format for your next appointment:    In Person  Provider:   [name], PA-C        Other Instructions  [place] A DEPT OF [place] A DEPT OF . [facility] [place] [id number] [place] [address] Dept: [place]: [phone]  Nylani Michetti  03/28/2022  You are scheduled for a Cardiac Catheterization on Friday, September 8 with Dr. Lenna Sciara.  1. Please arrive at the [facility] (Main Entrance A) at [facility]: [address] [place], [place] [address] at 10:00 AM (This time is two hours before your procedure to ensure your preparation). Free valet parking service is available.   Special note: Every effort is made to have your procedure done on time. Please understand that emergencies sometimes delay scheduled procedures.  2. Diet: Do not eat solid foods after midnight.  The patient may have clear liquids until 5am upon the day of the procedure.  3. Medication instructions in preparation for your procedure:   Contrast Allergy: No  Stop taking, HCTZ and Hyzaar the morning of Heart Cath  Friday, September 8,  On the morning of your procedure, take your Aspirin and any morning medicines NOT listed above.  You may use sips of water.  4. Plan for one night stay--bring personal belongings. 5. Bring a current list of your medications and current insurance cards. 6. You MUST have a responsible person to drive you home. 7. Someone MUST be with you the first 24 hours after you arrive home or your discharge will be delayed. 8. Please  wear clothes that are easy to get on and off and wear slip-on shoes.  Thank you for allowing [place] to care for you!   -- [place] Invasive Cardiovascular services   Important Information About Sugar

## 2022-03-29 ENCOUNTER — Other Ambulatory Visit: Payer: Self-pay | Admitting: Physician Assistant

## 2022-03-29 ENCOUNTER — Other Ambulatory Visit: Payer: Self-pay

## 2022-03-29 ENCOUNTER — Telehealth: Payer: Self-pay | Admitting: *Deleted

## 2022-03-29 ENCOUNTER — Encounter: Payer: Self-pay | Admitting: Physician Assistant

## 2022-03-29 ENCOUNTER — Telehealth: Payer: Self-pay | Admitting: Physician Assistant

## 2022-03-29 MED ORDER — AMBULATORY NON FORMULARY MEDICATION: 0 refills | Status: DC

## 2022-03-29 MED ORDER — SODIUM CHLORIDE 0.9% FLUSH: 3.0000 mL | Freq: Two times a day (BID) | INTRAVENOUS | Status: DC

## 2022-03-29 NOTE — Telephone Encounter (Signed)
Cardiac Catheterization scheduled at [facility] for: Friday March 30, 2022 10:30 AM Arrival time and place: [place] Entrance A at: 8:30 AM  Nothing to eat after midnight prior to procedure, clear liquids until 5 AM day of procedure.  Medication instructions: -Hold:  HCTZ-AM of procedure  Losartan/HCTZ-AM of procedure -Usual morning medications can be taken with sips of water including Plavix 75 mg.  Confirmed patient has responsible adult to drive home post procedure and be with patient first 24 hours after arriving home.  Patient reports no new symptoms concerning for COVID-19 in the past 10 days.  Reviewed procedure instructions with patient.

## 2022-03-29 NOTE — Telephone Encounter (Signed)
GI Cocktail is faxed to [facility]. [facility] has shortage of lidocaine. Pharmacy will notify the patient when it is ready for pick up. Patient made aware.

## 2022-03-29 NOTE — Telephone Encounter (Signed)
GI cocktail faxed to the patient's pharmacy. Patient is informed.

## 2022-03-29 NOTE — Telephone Encounter (Signed)
Olegario Messier, will you follow up on this please? Thanks

## 2022-03-29 NOTE — Addendum Note (Signed)
Addended by: Howell Pringle on: 03/29/2022 11:49 AM   Modules accepted: Orders

## 2022-03-29 NOTE — Telephone Encounter (Signed)
Called and spoke with patient. She is aware that EGD has been cancelled due to upcoming cardiac cath appt. Pt has been advised that Anderson Malta would like to see her back in the office in October before rescheduling EGD. Pt has been scheduled for a follow up appt with Ellouise Newer, PA-C on Monday, 04/30/22 at 10:30 am. Pt verbalized understanding and had no concerns at the end of the call.

## 2022-03-29 NOTE — Telephone Encounter (Signed)
Chart reviewed this morning. Pt had cardiology visit yesterday and patient will be having a cardiac catheterization on 03/30/22. Pt has a 6-week follow up with cardiology on 04/18/22.  How far out would you like to postpone EGD? Or would you like to see patient back in the office prior to rescheduling? Thanks  I left patient a detailed vm letting her know that we are aware of the plan for cardiac cath and we will have to postpone procedures. I told pt that I will check with Ellouise Newer, PA-C about rescheduling and will give her a call back once I have more information. I told pt that she can call back in the interim if she has any questions.

## 2022-03-29 NOTE — Telephone Encounter (Signed)
Inbound call from patient stating the pharmacy has not received prescription for GI cocktail. Please give a call to further advise   Thank you

## 2022-03-30 ENCOUNTER — Ambulatory Visit (HOSPITAL_COMMUNITY)
Admission: RE | Admit: 2022-03-30 | Discharge: 2022-03-30 | Disposition: A | Discharge status: Home / Self Care | Payer: Medicare Other | Attending: Internal Medicine | Admitting: Internal Medicine

## 2022-03-30 ENCOUNTER — Other Ambulatory Visit: Payer: Self-pay

## 2022-03-30 ENCOUNTER — Encounter (HOSPITAL_COMMUNITY)
Admission: RE | Disposition: A | Discharge status: Home / Self Care | Payer: Self-pay | Source: Home / Self Care | Attending: Internal Medicine

## 2022-03-30 DIAGNOSIS — I25119 Atherosclerotic heart disease of native coronary artery with unspecified angina pectoris: Secondary | ICD-10-CM | POA: Diagnosis not present

## 2022-03-30 DIAGNOSIS — Z87891 Personal history of nicotine dependence: Secondary | ICD-10-CM | POA: Insufficient documentation

## 2022-03-30 DIAGNOSIS — Z79899 Other long term (current) drug therapy: Secondary | ICD-10-CM | POA: Insufficient documentation

## 2022-03-30 DIAGNOSIS — G4733 Obstructive sleep apnea (adult) (pediatric): Secondary | ICD-10-CM | POA: Insufficient documentation

## 2022-03-30 DIAGNOSIS — I2510 Atherosclerotic heart disease of native coronary artery without angina pectoris: Secondary | ICD-10-CM | POA: Diagnosis not present

## 2022-03-30 DIAGNOSIS — Z7902 Long term (current) use of antithrombotics/antiplatelets: Secondary | ICD-10-CM | POA: Diagnosis not present

## 2022-03-30 DIAGNOSIS — Z955 Presence of coronary angioplasty implant and graft: Secondary | ICD-10-CM | POA: Insufficient documentation

## 2022-03-30 DIAGNOSIS — I10 Essential (primary) hypertension: Secondary | ICD-10-CM | POA: Diagnosis not present

## 2022-03-30 DIAGNOSIS — E785 Hyperlipidemia, unspecified: Secondary | ICD-10-CM | POA: Insufficient documentation

## 2022-03-30 DIAGNOSIS — R001 Bradycardia, unspecified: Secondary | ICD-10-CM | POA: Diagnosis not present

## 2022-03-30 DIAGNOSIS — R9439 Abnormal result of other cardiovascular function study: Secondary | ICD-10-CM | POA: Diagnosis present

## 2022-03-30 HISTORY — PX: LEFT HEART CATH AND CORONARY ANGIOGRAPHY: CATH118249

## 2022-03-30 SURGERY — LEFT HEART CATH AND CORONARY ANGIOGRAPHY
Anesthesia: LOCAL

## 2022-03-30 MED ORDER — MIDAZOLAM HCL 2 MG/2ML IJ SOLN
INTRAMUSCULAR | Status: AC
  Filled 2022-03-30: qty 2

## 2022-03-30 MED ORDER — HEPARIN SODIUM (PORCINE) 1000 UNIT/ML IJ SOLN
INTRAMUSCULAR | Status: DC | PRN
  Administered 2022-03-30: 5000 [IU] via INTRAVENOUS

## 2022-03-30 MED ORDER — SODIUM CHLORIDE 0.9 % IV SOLN: 250.0000 mL | INTRAVENOUS | Status: DC | PRN

## 2022-03-30 MED ORDER — HYDRALAZINE HCL 20 MG/ML IJ SOLN: 10.0000 mg | INTRAMUSCULAR | Status: DC | PRN

## 2022-03-30 MED ORDER — FENTANYL CITRATE (PF) 100 MCG/2ML IJ SOLN
INTRAMUSCULAR | Status: AC
  Filled 2022-03-30: qty 2

## 2022-03-30 MED ORDER — ASPIRIN 81 MG PO CHEW
81.0000 mg | CHEWABLE_TABLET | ORAL | Status: AC
  Administered 2022-03-30: 81 mg via ORAL
  Filled 2022-03-30: qty 1

## 2022-03-30 MED ORDER — LIDOCAINE HCL (PF) 1 % IJ SOLN
INTRAMUSCULAR | Status: AC
  Filled 2022-03-30: qty 30

## 2022-03-30 MED ORDER — MIDAZOLAM HCL 2 MG/2ML IJ SOLN
INTRAMUSCULAR | Status: DC | PRN
  Administered 2022-03-30: 1 mg via INTRAVENOUS

## 2022-03-30 MED ORDER — VERAPAMIL HCL 2.5 MG/ML IV SOLN
INTRAVENOUS | Status: AC
  Filled 2022-03-30: qty 2

## 2022-03-30 MED ORDER — LABETALOL HCL 5 MG/ML IV SOLN: 10.0000 mg | INTRAVENOUS | Status: DC | PRN

## 2022-03-30 MED ORDER — HEPARIN SODIUM (PORCINE) 1000 UNIT/ML IJ SOLN
INTRAMUSCULAR | Status: AC
  Filled 2022-03-30: qty 10

## 2022-03-30 MED ORDER — FENTANYL CITRATE (PF) 100 MCG/2ML IJ SOLN
INTRAMUSCULAR | Status: DC | PRN
  Administered 2022-03-30: 25 ug via INTRAVENOUS

## 2022-03-30 MED ORDER — IOHEXOL 350 MG/ML SOLN
INTRAVENOUS | Status: DC | PRN
  Administered 2022-03-30: 25 mL

## 2022-03-30 MED ORDER — LIDOCAINE HCL (PF) 1 % IJ SOLN
INTRAMUSCULAR | Status: DC | PRN
  Administered 2022-03-30: 2 mL

## 2022-03-30 MED ORDER — SODIUM CHLORIDE 0.9 % WEIGHT BASED INFUSION: 1.0000 mL/kg/h | INTRAVENOUS | Status: DC

## 2022-03-30 MED ORDER — VERAPAMIL HCL 2.5 MG/ML IV SOLN
INTRAVENOUS | Status: DC | PRN
  Administered 2022-03-30: 5 mL via INTRA_ARTERIAL

## 2022-03-30 MED ORDER — ASPIRIN 81 MG PO CHEW: 81.0000 mg | CHEWABLE_TABLET | ORAL | Status: DC

## 2022-03-30 MED ORDER — ONDANSETRON HCL 4 MG/2ML IJ SOLN: 4.0000 mg | Freq: Four times a day (QID) | INTRAMUSCULAR | Status: DC | PRN

## 2022-03-30 MED ORDER — HEPARIN (PORCINE) IN NACL 1000-0.9 UT/500ML-% IV SOLN
INTRAVENOUS | Status: DC | PRN
  Administered 2022-03-30 (×2): 500 mL

## 2022-03-30 MED ORDER — CLOPIDOGREL BISULFATE 75 MG PO TABS
75.0000 mg | ORAL_TABLET | Freq: Once | ORAL | Status: AC
  Administered 2022-03-30: 75 mg via ORAL
  Filled 2022-03-30: qty 1

## 2022-03-30 MED ORDER — SODIUM CHLORIDE 0.9% FLUSH: 3.0000 mL | INTRAVENOUS | Status: DC | PRN

## 2022-03-30 MED ORDER — SODIUM CHLORIDE 0.9 % IV SOLN: INTRAVENOUS | Status: DC

## 2022-03-30 MED ORDER — SODIUM CHLORIDE 0.9 % WEIGHT BASED INFUSION
3.0000 mL/kg/h | INTRAVENOUS | Status: AC
  Administered 2022-03-30: 3 mL/kg/h via INTRAVENOUS

## 2022-03-30 MED ORDER — HEPARIN (PORCINE) IN NACL 1000-0.9 UT/500ML-% IV SOLN
INTRAVENOUS | Status: AC
  Filled 2022-03-30: qty 1000

## 2022-03-30 MED ORDER — SODIUM CHLORIDE 0.9% FLUSH: 3.0000 mL | Freq: Two times a day (BID) | INTRAVENOUS | Status: DC

## 2022-03-30 MED ORDER — ACETAMINOPHEN 325 MG PO TABS: 650.0000 mg | ORAL_TABLET | ORAL | Status: DC | PRN

## 2022-03-30 SURGICAL SUPPLY — 11 items
BAND CMPR LRG ZPHR (HEMOSTASIS) ×1
BAND ZEPHYR COMPRESS 30 LONG (HEMOSTASIS) IMPLANT
CATH DXT MULTI JL4 JR4 ANG PIG (CATHETERS) IMPLANT
CATH OPTITORQUE TIG 4.0 6F (CATHETERS) IMPLANT
GLIDESHEATH SLEND SS 6F .021 (SHEATH) IMPLANT
GUIDEWIRE INQWIRE 1.5J.035X260 (WIRE) IMPLANT
INQWIRE 1.5J .035X260CM (WIRE) ×1
KIT HEART LEFT (KITS) ×1 IMPLANT
PACK CARDIAC CATHETERIZATION (CUSTOM PROCEDURE TRAY) ×1 IMPLANT
TRANSDUCER W/STOPCOCK (MISCELLANEOUS) ×1 IMPLANT
TUBING CIL FLEX 10 FLL-RA (TUBING) ×1 IMPLANT

## 2022-03-30 NOTE — Interval H&P Note (Signed)
History and Physical Interval Note:  03/30/2022 12:46 PM  Carrie Reynolds  has presented today for surgery, with the diagnosis of abnormal stress test.  The various methods of treatment have been discussed with the patient and family. After consideration of risks, benefits and other options for treatment, the patient has consented to  Procedure(s): LEFT HEART CATH AND CORONARY ANGIOGRAPHY (N/A) as a surgical intervention.  The patient's history has been reviewed, patient examined, no change in status, stable for surgery.  I have reviewed the patient's chart and labs.  Questions were answered to the patient's satisfaction.     Early Osmond

## 2022-03-30 NOTE — Progress Notes (Signed)
TR BAND REMOVAL  LOCATION:    right radial  DEFLATED PER PROTOCOL:    Yes.    TIME BAND OFF / DRESSING APPLIED:    1600   SITE UPON ARRIVAL:    Level 0  SITE AFTER BAND REMOVAL:    Level 0  CIRCULATION SENSATION AND MOVEMENT:    Within Normal Limits   Yes.    COMMENTS:   No bleeding noted

## 2022-04-02 ENCOUNTER — Encounter (HOSPITAL_COMMUNITY): Payer: Self-pay | Admitting: Internal Medicine

## 2022-04-03 ENCOUNTER — Encounter: Payer: Medicare Other | Admitting: Gastroenterology

## 2022-04-09 ENCOUNTER — Ambulatory Visit (INDEPENDENT_AMBULATORY_CARE_PROVIDER_SITE_OTHER): Payer: Medicare Other

## 2022-04-09 ENCOUNTER — Ambulatory Visit (INDEPENDENT_AMBULATORY_CARE_PROVIDER_SITE_OTHER): Payer: Medicare Other | Admitting: Orthopedic Surgery

## 2022-04-09 VITALS — Ht 67.0 in | Wt 209.0 lb

## 2022-04-09 DIAGNOSIS — M545 Low back pain, unspecified: Secondary | ICD-10-CM

## 2022-04-09 DIAGNOSIS — M79672 Pain in left foot: Secondary | ICD-10-CM

## 2022-04-09 DIAGNOSIS — M25562 Pain in left knee: Secondary | ICD-10-CM

## 2022-04-09 DIAGNOSIS — I208 Other forms of angina pectoris: Secondary | ICD-10-CM | POA: Diagnosis not present

## 2022-04-09 DIAGNOSIS — M1811 Unilateral primary osteoarthritis of first carpometacarpal joint, right hand: Secondary | ICD-10-CM | POA: Diagnosis not present

## 2022-04-11 ENCOUNTER — Encounter: Payer: Self-pay | Admitting: Orthopedic Surgery

## 2022-04-11 NOTE — Progress Notes (Signed)
Office Visit Note   Patient: Carrie Reynolds           Date of Birth: January 04, 1956           MRN: [id number] Visit Date: 04/09/2022 Requested by: Leonard Downing, MD [address] [place],  [place] [address] PCP: Leonard Downing, MD  Subjective: No chief complaint on file.   HPI: Carrie Reynolds is a patient with multiple orthopedic issues today.  She reports left great toe pain which comes and goes and is worse with activity.  Localizes that pain to the MTP joint.  She also describes left knee pain with grinding locking popping stiffness.  Feels like it gets tight when she goes upstairs.  Patient also reports bilateral thumb pain with prior left thumb injection.  That helped some but now it has worn off.  She is right-hand dominant.  She also describes back pain which radiates down to both ankles.  She did have an MRI of her lumbar spine in 2018 which showed right-sided L3-4 and L4-5 degenerative disc disease.  Takes Tylenol PM with minimal relief.  Has a history of gastric bypass.  She is in school for [employer] work.  Heart stents placed for 2 blockages since her last office visit.  She is on Plavix.              ROS: All systems reviewed are negative as they relate to the chief complaint within the history of present illness.  Patient denies  fevers or chills.   Assessment & Plan: Visit Diagnoses:  1. Pain in left foot   2. Left knee pain, unspecified chronicity   3. Low back pain, unspecified back pain laterality, unspecified chronicity, unspecified whether sciatica present   4. Arthritis of carpometacarpal [facility]) joint of right thumb     Plan: Impression is multiple orthopedic complaints today.  Radiographs of the left foot demonstrate reasonable alignment with no bunion and no arthritis in the MTP joint.  This could be EHL tendinitis with stress fracture or stress reaction being less likely.  Left knee has lateral compartment mild arthritis on radiographs but no  bone-on-bone changes.  She is having some mechanical symptoms plus effusion.  Prior history of aspiration and injection with unsustained relief.  Plan for that is MRI scan of the left knee to evaluate lateral meniscal tear.  Patient does have bilateral thumb pain with bilateral CMC arthritis right worse than left.  She would like surgical evaluation which we will perform by sending her to Dr. Harl Favor.  She also has some low back pain with history of known degenerative disc disease.  MRI scan 66 years old but I think that is recent enough that Dr. Ernestina Patches could evaluate and perform lumbar spine ESI for low back pain and right-sided radiculopathy.  Follow-up with me as needed.  Follow-Up Instructions: No follow-ups on file.   Orders:  Orders Placed This Encounter  Procedures   XR Foot Complete Left   XR KNEE 3 VIEW LEFT   XR Lumbar Spine 2-3 Views   MR Knee Left w/o contrast   Ambulatory referral to Physical Medicine Rehab   Ambulatory referral to Orthopedic Surgery   No orders of the defined types were placed in this encounter.     Procedures: No procedures performed   Clinical Data: No additional findings.  Objective: Vital Signs: Ht '5\' 7"'$  (1.702 m)   Wt 209 lb (94.8 kg)   LMP 07/24/2003 (Within Months)   BMI 32.73  kg/m   Physical Exam:   Constitutional: Patient appears well-developed HEENT:  Head: Normocephalic Eyes:EOM are normal Neck: Normal range of motion Cardiovascular: Normal rate Pulmonary/chest: Effort normal Neurologic: Patient is alert Skin: Skin is warm Psychiatric: Patient has normal mood and affect   Ortho Exam: Ortho exam demonstrates pretty symmetric left great toe range of motion at the IP and MTP joint left foot versus right foot.  Pedal pulses intact.  Palpable nontender anterior to posterior to peroneal and Achilles tendons on the left-hand side.  Symmetric tibiotalar subtalar transverse tarsal range of motion without restriction and without  too much in terms of Achilles tightness.  No spurring palpable around the MTP joint on that left great toe.  Left knee demonstrates normal alignment with trace effusion and lateral greater than medial joint line tenderness.  Extensor mechanism intact with mild patellofemoral crepitus and no patellar apprehension.  Collateral crucial ligaments are stable.  Pedal pulses palpable.  Bilateral thumbs have positive CMC grind test with intact EPL FPL function.  Wrist range of motion intact.  Pinching is more painful on the right than the left.  Radial pulse intact bilaterally.  Patient has right greater than left nerve root tension signs good.  Good motor strength to ankle dorsiflexion plantarflexion quad and hamstring testing.  No definite paresthesias L1-S1 bilaterally but she does have some pain with forward and lateral bending.  Specialty Comments:  MRI LUMBAR SPINE FINDINGS   Segmentation: Assumed standard. The last well-formed disc space is designated L5-S1 for the purposes of this report.   Alignment:  Physiologic.   Vertebrae: No fracture, evidence of discitis, or bone lesion. Small hemangioma in the L1 vertebral body.   Conus medullaris and cauda equina: Conus extends to the L1 level. Conus and cauda equina appear normal.   Paraspinal and other soft tissues: Negative.   Disc levels:   T12-L1:  Negative.   L1-L2:  Negative.   L2-L3:  Negative.   L3-L4: Small disc bulge, asymmetric to the left, with annular fissure. Moderate right greater than left facet arthropathy with small right facet joint effusion. No stenosis.   L4-L5: Small disc bulge and annular fissure. Moderate bilateral facet arthropathy with ligamentum flavum hypertrophy. Small right synovial cyst projecting into the paraspinous soft tissues. Mild bilateral neuroforaminal stenosis. No spinal canal stenosis.   L5-S1:  Mild to moderate bilateral facet arthropathy.  No stenosis.   IMPRESSION: 1. No acute  traumatic injury within the cervical, thoracic, or lumbar spine. 2. Mild degenerative disc disease at C4-C5 and C5-C6. 3. Mild degenerative disc disease and moderate bilateral facet arthropathy at L3-L4 and L4-L5. Mild bilateral neuroforaminal stenosis at L4-L5. 4. 2.3 cm indeterminate right adrenal gland nodule. Recommend nonemergent adrenal protocol CT with and without contrast for further evaluation. This recommendation follows ACR consensus guidelines: Management of Incidental Adrenal Masses: A White Paper of the ACR Incidental Findings Committee. J Am Coll Radiol 2017;14:1038-1044.     Electronically Signed   By: Titus Dubin M.D.   On: 10/17/2017 16:56  Imaging: No results found.   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   Ulcer of small intestine 09/06/2021   Acute blood loss anemia 09/06/2021   Iron deficiency 09/06/2021   Colon cancer screening 09/06/2021   Long term (current) use of antithrombotics/antiplatelets 09/06/2021   Jejunal ulcer 08/21/2021   Syncope 07/15/2021   Sinus bradycardia 11/03/2019   Atypical angina ([place]) 10/12/2019  Fatigue 10/12/2019   Presence of 2 overlapping Drug Coated Stents in LAD coronary artery    Coronary artery disease involving native coronary artery of native heart with angina pectoris ([place]) 06/17/2019   DOE (dyspnea on exertion) 06/16/2019   CAD S/P DES PCI-proximal LAD 05/20/2019   Agatston coronary artery calcium score greater than 400 04/16/2019   Migraine with vertigo 07/29/2018   Rapid palpitations 79/39/0300   Systolic murmur 92/33/0076   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   OSA on CPAP 11/19/2013   Hyperlipidemia with target LDL less than 70 07/29/2013   Obesity (BMI 30.0-34.9) 04/25/2012   Depression with anxiety 04/25/2012   Environmental allergies 04/25/2012   Essential (primary) hypertension 04/25/2012   Chiari  malformation type I ([place]) 01/03/2012   Postmenopausal 09/18/2011   Past Medical History:  Diagnosis Date   Anxiety    Arnold-Chiari malformation ([place]) 2006   CAD S/P PCI 05/06/2019   05/06/2019: prox LAD (@ SPI) PCI with Resolute Onyx DES 3.5  x 8, Residual 60% PDA, Normal LVF; 07/02/2019: relook Cath for Anterior Ischemia on [place] --> CATH 70% pre-stent/proximal edge dissection --> proximal overlapping DES (Resolute Onyx 3.5 x 8 - new & old stent post-dilated to 3.8 mm)   Cervical strain    syndrome   Cervicogenic headache    Chronic kidney disease    Concussion with loss of consciousness 10/09/2017   CTS (carpal tunnel syndrome)    Depression    GERD (gastroesophageal reflux disease)    Glucose intolerance (impaired glucose tolerance)    High coronary artery calcium score    Score of 1460.  Coronary CTA shows high-grade proximal-mid LAD stenosis (CT FFR 0.50).  Also PDA/PLA 60 to 70% stenosis (CTO FFR 0.75)   History of kidney stones    Hypertension    Migraine headache    with visual aura   Obesity    OSA (obstructive sleep apnea)    not treated   Postmenopausal    Sleep apnea    Vertigo    post concussive    Family History  Problem Relation Age of Onset   Hypertension Mother    Lung cancer Father    Alzheimer's disease Father    Migraines Father    Hyperlipidemia Sister    Hypertension Sister    Colon cancer Maternal Grandmother    Heart failure Maternal Grandfather    Alzheimer's disease Paternal Grandmother    Lung cancer Paternal Grandfather    Esophageal cancer Neg Hx    Inflammatory bowel disease Neg Hx    Liver disease Neg Hx    Pancreatic cancer Neg Hx    Rectal cancer Neg Hx    Stomach cancer Neg Hx     Past Surgical History:  Procedure Laterality Date   BIOPSY  07/15/2021   Procedure: BIOPSY;  Surgeon: Mansouraty, Telford Nab., MD;  Location: [facility] ENDOSCOPY;  Service: Gastroenterology;;   Lillard Anes     right great toe   CHOLECYSTECTOMY N/A  06/05/2018   Procedure: LAPAROSCOPIC CHOLECYSTECTOMY WITH POSSIBLE INTRAOPERATIVE CHOLANGIOGRAM;  Surgeon: Erroll Luna, MD;  Location: [place];  Service: General;  Laterality: N/A;   COLOSTOMY     CORONARY STENT INTERVENTION N/A 05/12/2019   Procedure: CORONARY STENT INTERVENTION;  Surgeon: Leonie Man, MD;  Location: [place] CV LAB;  Service: Cardiovascular;;; Culprit-p-mLAD 95% 950% SP1) --> Scoring Balloon PTCA -> DES PCI (Resolute DES 3.5 x 38 - 3.8 mm; 0% LAD& 55% SP1). -->  SP1 was somewhat jailed with TIMI  II flow post PCI.   CORONARY STENT INTERVENTION N/A 07/02/2019   Procedure: CORONARY STENT INTERVENTION;  Surgeon: Leonie Man, MD;  Location: [place] CV LAB;  Service: Cardiovascular;; * 70% Proximal stent edge dissection (edge of previous stent that is patent) -->  successfully covered with additional RESOLUTE ONYX DES 3.5 mm x 8 mm overlapping proximal edge of previous stent (postdilated to 3.8 mm)   ENTEROSCOPY N/A 07/15/2021   Procedure: ENTEROSCOPY;  Surgeon: Rush Landmark Telford Nab., MD;  Location: [place];  Service: Gastroenterology;  Laterality: N/A;   GASTRIC BYPASS  11/2015   [facility]   GYNECOLOGIC CRYOSURGERY     HAND SURGERY     LEFT HEART CATH AND CORONARY ANGIOGRAPHY N/A 05/12/2019   Procedure: LEFT HEART CATH AND CORONARY ANGIOGRAPHY;  Surgeon: Leonie Man, MD;  Location: [place] CV LAB;  Service: Cardiovascular;;; Culprit-p-mLAD 95% 950% SP1) --> DES PCI.;  RPAV 60% -small caliber vessel, did not appear flow-limiting (medical management).  EF 55 to 60%.  Normal LVEDP.   LEFT HEART CATH AND CORONARY ANGIOGRAPHY N/A 07/02/2019   Procedure: LEFT HEART CATH AND CORONARY ANGIOGRAPHY;  Surgeon: Leonie Man, MD;  Location: [place] CV LAB;  Service: Cardiovascular;; CULPRIT = ~70% proximal edge dissection of previous stent (DES PCI overlapping stent placed) - jails SP1 w/ ~60-70% ostial stenosis.[name] mLAD 25%. RI 30%, RPAV 60%.   LEFT HEART CATH  AND CORONARY ANGIOGRAPHY N/A 03/30/2022   Procedure: LEFT HEART CATH AND CORONARY ANGIOGRAPHY;  Surgeon: Early Osmond, MD;  Location: [place] CV LAB;  Service: Cardiovascular;  Laterality: N/A;   NM MYOVIEW LTD  06/24/2019   Carlton Adam): HIGH RISK.  EF 55%.  Large size, moderate severe defect in the anterior wall with associated anterior hypokinesis.  Suspect LAD territory.   NM MYOVIEW LTD  10/23/2019   Normal EF 55 to 60%.  No EKG changes.  Small size mild severity fixed defect in the apical wall consistent with breast attenuation versus small prior infarct.  Marked improvement from anterior apical perfusion abnormality seen in February 2020.   TRANSTHORACIC ECHOCARDIOGRAM  12/08/2020   Normal LV Fxn - EF 60-65%. No RWMA. Mod Asymmetric Basal Septa LVH with mild Concentric LVH. Gr II DD. Normal Longitudinal Strain. Normal RV size & fxn - normal RAP/CVP.  Normal MV. Mild AoV sclerosis w/ stenosis. Mild Ascending Ao dilation - ~38 mm (borderline).  => No change from 04/2017.   TUBAL LIGATION Bilateral    UPPER GASTROINTESTINAL ENDOSCOPY     VAGINAL HYSTERECTOMY  2003   with LSO and right salpingectomy; right ovary still present   [facility] MONITOR-14-day  01/2021   Predominant rhythm sinus-heart rate range 40 to 124 bpm.  Average 70 bpm.  Rare PVCs and isolated PACs noted.  PAC couplets and triplets also noted as well as trigeminy.  3 atrial runs of 4-5 beats ranging from 102 to 150 bpm.  (2 of 3 noted on patient trigger.  Remainder patient triggers were sinus rhythm with PACs or PVCs); no atrial fibrillation or other sustained arrhythmias noted.   Social History   Occupational History   Occupation: Retired Best boy: [employer]    Comment: Logistics, warehouse  Tobacco Use   Smoking status: Former    Packs/day: 0.10    Years: 30.00    Total pack years: 3.00    Types: Cigarettes    Quit date: 04/26/2012    Years since quitting: 9.9   Smokeless  tobacco: Never   Vaping Use   Vaping Use: Never used  Substance and Sexual Activity   Alcohol use: Yes    Comment: occasional   Drug use: Yes    Types: Marijuana    Comment: weed juice   Sexual activity: Yes    Partners: Male    Birth control/protection: Surgical    Comment: hysterectomy

## 2022-04-18 ENCOUNTER — Ambulatory Visit: Payer: Medicare Other | Admitting: Physician Assistant

## 2022-04-18 ENCOUNTER — Encounter: Payer: Self-pay | Admitting: Physician Assistant

## 2022-04-18 NOTE — Progress Notes (Signed)
This encounter was created in error - please disregard.

## 2022-04-20 ENCOUNTER — Ambulatory Visit: Payer: Medicare Other | Admitting: Physician Assistant

## 2022-04-24 ENCOUNTER — Ambulatory Visit: Payer: Medicare Other | Attending: Physician Assistant | Admitting: Physician Assistant

## 2022-04-24 ENCOUNTER — Encounter: Payer: Self-pay | Admitting: Physician Assistant

## 2022-04-24 ENCOUNTER — Telehealth: Payer: Self-pay | Admitting: Physical Medicine and Rehabilitation

## 2022-04-24 ENCOUNTER — Telehealth: Payer: Self-pay | Admitting: Orthopedic Surgery

## 2022-04-24 VITALS — BP 130/78 | HR 76 | Ht 67.0 in | Wt 212.6 lb

## 2022-04-24 DIAGNOSIS — E785 Hyperlipidemia, unspecified: Secondary | ICD-10-CM | POA: Diagnosis not present

## 2022-04-24 DIAGNOSIS — I10 Essential (primary) hypertension: Secondary | ICD-10-CM | POA: Diagnosis not present

## 2022-04-24 DIAGNOSIS — I2510 Atherosclerotic heart disease of native coronary artery without angina pectoris: Secondary | ICD-10-CM | POA: Diagnosis not present

## 2022-04-24 DIAGNOSIS — I2089 Other forms of angina pectoris: Secondary | ICD-10-CM

## 2022-04-24 DIAGNOSIS — Z01810 Encounter for preprocedural cardiovascular examination: Secondary | ICD-10-CM | POA: Diagnosis not present

## 2022-04-24 NOTE — Telephone Encounter (Signed)
Pt called requesting to reschedule appt. Please call pt at [phone].

## 2022-04-24 NOTE — Patient Instructions (Signed)
Medication Instructions:  HOLD Plavix 5-7 days prior to upcoming procedure  Your physician recommends that you continue on your current medications as directed. Please refer to the Current Medication list given to you today.  *If you need a refill on your cardiac medications before your next appointment, please call your pharmacy*  Lab Work: NONE ordered at this time of appointment   If you have labs (blood work) drawn today and your tests are completely normal, you will receive your results only by: [place] (if you have MyChart) OR A paper copy in the mail If you have any lab test that is abnormal or we need to change your treatment, we will call you to review the results.  Testing/Procedures: NONE ordered at this time of appointment   Follow-Up: At [facility], you and your health needs are our priority.  As part of our continuing mission to provide you with exceptional heart care, we have created designated Provider Care Teams.  These Care Teams include your primary Cardiologist (physician) and Advanced Practice Providers (APPs -  Physician Assistants and Nurse Practitioners) who all work together to provide you with the care you need, when you need it.  Your next appointment:   6-9 month(s)  The format for your next appointment:   In Person  Provider:   [name], MD     Other Instructions   Important Information About Sugar

## 2022-04-24 NOTE — Telephone Encounter (Signed)
Called patient left message to return call to schedule an appointment for MRI review with Dr. Marlou Sa

## 2022-04-24 NOTE — Progress Notes (Signed)
Cardiology Office Note:    Date:  04/26/2022   ID:  Carrie Reynolds, DOB November 18, 1955, MRN [id number]  PCP:  Leonard Downing, MD   [facility] Cardiologist:  [name], MD     Referring MD: Leonard Downing, *   Chief Complaint  Patient presents with   Follow-up    Seen for Dr. Ellyn Hack    History of Present Illness:    Carrie Reynolds is a 66 y.o. female with a hx of CAD s/p DESx 2 to LAD, sinus bradycardia, hypertension, hyperlipidemia, and OSA.  Cardiac catheterization performed on 05/12/2019 following abnormal coronary CT revealed 95% proximal to mid LAD lesion treated with Resolute Onyx 3.5 x 8 mm DES, 50% lesion in the sidebranch in first septal perforator.  Due to recurrent chest pain and abnormal Myoview, patient underwent cardiac catheterization again on 07/02/2019 that revealed EF 55 to 65%, widely patent proximal to mid LAD stent with 70% stenosis proximal to the stent edge due to likely dissection, this was treated with Resolute Onyx 3.5 x 8 mm DES, 25% mid LAD lesion, 30% ramus lesion, 60% RPAV lesion.  She had a nuclear stress test in April 2021 and Apr 2022 [facility]) which were nonischemic.  She is no longer on beta-blocker due to sinus bradycardia.  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  Patient was admitted at [facility] due to arm pain and a near syncope in April 2022, Myoview was nonischemic.  The incidence was felt to be due to dehydration.  The diltiazem stopped as her heart rate was in the 40s.  Echocardiogram performed on 12/08/2020 showed EF 60 to 65%, grade 2 DD, trivial MR.  Patient was last seen by Dr. Ellyn Hack on 08/21/2021 at which time he was doing well without further chest discomfort.  I recently seen the patient for intermittent chest discomfort and ordered a Myoview which came back somewhat abnormal.  We proceeded with cardiac catheterization on 04/09/2022 which showed  minimal residual CAD and patent stent in the LAD territory.  Patient still has intermittent shortness of breath, however her lung is clear.  Myoview showed a normal EF.  At this time, I do not recommend any further work-up.  She is going to undergo orthopedic injection and also GI evaluation.  She will be cleared for both procedures.  She may hold the Plavix for 5 to 7 days prior to any surgical procedure and restart as soon as possible afterward.  She will be a low risk candidate for above procedures.  Past Medical History:  Diagnosis Date   Anxiety    Arnold-Chiari malformation ([place]) 2006   CAD S/P PCI 05/06/2019   05/06/2019: prox LAD (@ SPI) PCI with Resolute Onyx DES 3.5  x 8, Residual 60% PDA, Normal LVF; 07/02/2019: relook Cath for Anterior Ischemia on Mhoview --> CATH 70% pre-stent/proximal edge dissection --> proximal overlapping DES (Resolute Onyx 3.5 x 8 - new & old stent post-dilated to 3.8 mm)   Cervical strain    syndrome   Cervicogenic headache    Chronic kidney disease    Concussion with loss of consciousness 10/09/2017   CTS (carpal tunnel syndrome)    Depression    GERD (gastroesophageal reflux disease)    Glucose intolerance (impaired glucose tolerance)    High coronary artery calcium score    Score of 1460.  Coronary CTA shows high-grade proximal-mid LAD stenosis (CT FFR 0.50).  Also PDA/PLA 60 to 70% stenosis (CTO FFR 0.75)   History of kidney stones    Hypertension    Migraine headache    with visual aura   Obesity    OSA (obstructive sleep apnea)    not treated   Postmenopausal    Sleep apnea    Vertigo    post concussive    Past Surgical History:  Procedure Laterality Date   BIOPSY  07/15/2021   Procedure: BIOPSY;  Surgeon: Rush Landmark Telford Nab., MD;  Location: [place];  Service: Gastroenterology;;   Lillard Anes     right great toe   CHOLECYSTECTOMY N/A 06/05/2018   Procedure: LAPAROSCOPIC CHOLECYSTECTOMY WITH POSSIBLE INTRAOPERATIVE  CHOLANGIOGRAM;  Surgeon: Erroll Luna, MD;  Location: [place];  Service: General;  Laterality: N/A;   COLOSTOMY     CORONARY STENT INTERVENTION N/A 05/12/2019   Procedure: CORONARY STENT INTERVENTION;  Surgeon: Leonie Man, MD;  Location: [place] [facility];  Service: Cardiovascular;;; Culprit-p-mLAD 95% 950% SP1) --> Scoring Balloon PTCA -> DES PCI (Resolute DES 3.5 x 38 - 3.8 mm; 0% LAD& 55% SP1). -->  SP1 was somewhat jailed with TIMI II flow post PCI.   CORONARY STENT INTERVENTION N/A 07/02/2019   Procedure: CORONARY STENT INTERVENTION;  Surgeon: Leonie Man, MD;  Location: [place] [facility];  Service: Cardiovascular;; * 70% Proximal stent edge dissection (edge of previous stent that is patent) -->  successfully covered with additional RESOLUTE ONYX DES 3.5 mm x 8 mm overlapping proximal edge of previous stent (postdilated to 3.8 mm)   ENTEROSCOPY N/A 07/15/2021   Procedure: ENTEROSCOPY;  Surgeon: Rush Landmark Telford Nab., MD;  Location: [place];  Service: Gastroenterology;  Laterality: N/A;   GASTRIC BYPASS  11/2015   [facility]   GYNECOLOGIC CRYOSURGERY     HAND SURGERY     LEFT HEART CATH AND CORONARY ANGIOGRAPHY N/A 05/12/2019   Procedure: LEFT HEART CATH AND CORONARY ANGIOGRAPHY;  Surgeon: Leonie Man, MD;  Location: [place] [facility];  Service: Cardiovascular;;; Culprit-p-mLAD 95% 950% SP1) --> DES PCI.;  RPAV 60% -small caliber vessel, did not appear flow-limiting (medical management).  EF 55 to 60%.  Normal LVEDP.   LEFT HEART CATH AND CORONARY ANGIOGRAPHY N/A 07/02/2019   Procedure: LEFT HEART CATH AND CORONARY ANGIOGRAPHY;  Surgeon: Leonie Man, MD;  Location: [place] [facility];  Service: Cardiovascular;; CULPRIT = ~70% proximal edge dissection of previous stent (DES PCI overlapping stent placed) - jails SP1 w/ ~60-70% ostial stenosis.[name] mLAD 25%. RI 30%, RPAV 60%.   LEFT HEART CATH AND CORONARY ANGIOGRAPHY N/A 03/30/2022   Procedure: LEFT HEART CATH AND CORONARY  ANGIOGRAPHY;  Surgeon: Early Osmond, MD;  Location: [place] [facility];  Service: Cardiovascular;  Laterality: N/A;   NM MYOVIEW LTD  06/24/2019   Carlton Adam): HIGH RISK.  EF 55%.  Large size, moderate severe defect in the anterior wall with associated anterior hypokinesis.  Suspect LAD territory.   NM MYOVIEW LTD  10/23/2019   Normal EF 55 to 60%.  No EKG changes.  Small size mild severity fixed defect in the apical wall consistent with breast attenuation versus small prior infarct.  Marked improvement from anterior apical perfusion abnormality seen in February 2020.   TRANSTHORACIC ECHOCARDIOGRAM  12/08/2020   Normal LV Fxn - EF 60-65%. No RWMA. Mod Asymmetric Basal Septa LVH with mild Concentric LVH. Gr II DD. Normal Longitudinal Strain. Normal RV size & fxn - normal RAP/CVP.  Normal MV. Mild AoV sclerosis  w/ stenosis. Mild Ascending Ao dilation - ~38 mm (borderline).  => No change from 04/2017.   TUBAL LIGATION Bilateral    UPPER GASTROINTESTINAL ENDOSCOPY     VAGINAL HYSTERECTOMY  2003   with LSO and right salpingectomy; right ovary still present   [facility] MONITOR-14-day  01/2021   Predominant rhythm sinus-heart rate range 40 to 124 bpm.  Average 70 bpm.  Rare PVCs and isolated PACs noted.  PAC couplets and triplets also noted as well as trigeminy.  3 atrial runs of 4-5 beats ranging from 102 to 150 bpm.  (2 of 3 noted on patient trigger.  Remainder patient triggers were sinus rhythm with PACs or PVCs); no atrial fibrillation or other sustained arrhythmias noted.    Current Medications: Current Meds  Medication Sig   acetaminophen (TYLENOL) 500 MG tablet Take 1,000 mg by mouth every 6 (six) hours as needed for mild pain, moderate pain or headache.   AMBULATORY NON FORMULARY MEDICATION Medication Name: GI Cocktail 90 ml Viscous Lidocaine, 90 ML Dicyclomine 10 mg/78m, 270 ML Maalox, Total of 450 ML. 5cc or 10 cc every 4 to 6 hours as needed for Epigastric pain.   cetirizine (ZYRTEC) 10  MG tablet Take 10 mg by mouth daily as needed for allergies.   clopidogrel (PLAVIX) 75 MG tablet TAKE 1 TABLET BY MOUTH EVERY DAY (Patient taking differently: Take 75 mg by mouth daily.)   diclofenac Sodium (VOLTAREN) 1 % GEL Apply 2 g topically daily as needed (Pain).   famotidine (PEPCID) 40 MG tablet Take 1 tablet (40 mg total) by mouth 2 (two) times daily.   ferrous sulfate 325 (65 FE) MG tablet Take 1 tablet (325 mg total) by mouth daily with breakfast.   hydrochlorothiazide (HYDRODIURIL) 12.5 MG tablet Take 12.5 mg by mouth daily.   isosorbide mononitrate (IMDUR) 30 MG 24 hr tablet Take 1 tablet (30 mg total) by mouth daily.   losartan-hydrochlorothiazide (HYZAAR) 100-12.5 MG tablet Take 0.5 tablets by mouth daily. (Patient taking differently: Take 1 tablet by mouth daily.)   nitroGLYCERIN (NITROSTAT) 0.4 MG SL tablet Place 1 tablet (0.4 mg total) under the tongue every 5 (five) minutes as needed.   pantoprazole (PROTONIX) 40 MG tablet Take 1 tablet (40 mg total) by mouth 2 (two) times daily.   propranolol (INDERAL) 10 MG tablet May take  1 to 2  tablets a day  as needed for palpiptation (Patient taking differently: Take 10 mg by mouth every 8 (eight) hours as needed (panic attack).)   rosuvastatin (CRESTOR) 40 MG tablet Take 40 mg by mouth daily.   sertraline (ZOLOFT) 100 MG tablet Take 100-200 mg by mouth daily as needed ([place]).   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 [employer]    Comment: Logistics, warehouse  Tobacco Use   Smoking status: Former    Packs/day: 0.10    Years: 30.00    Total pack years: 3.00  Types: Cigarettes    Quit date: 04/26/2012    Years since quitting: 10.0   Smokeless tobacco: Never  Vaping Use   Vaping Use: Never used  Substance and Sexual Activity   Alcohol use: Yes    Comment: occasional   Drug use: Yes    Types: Marijuana    Comment: weed juice   Sexual activity: Yes    Partners: Male    Birth control/protection: Surgical    Comment: hysterectomy  Other Topics Concern   Not on file  Social History Narrative   ** Merged History Encounter **       Lives with husband [name]). She travels very frequently with her job and is gone most weeks out of the month. She is active and exercises when able.   Social Determinants of Health   Financial Resource Strain: Low Risk  (05/28/2019)   Overall Financial Resource Strain (CARDIA)    Difficulty of Paying Living Expenses: Not hard at all  Food Insecurity: No Food Insecurity (05/28/2019)   Hunger Vital Sign    Worried About Running Out of Food in the Last Year: Never true    Ran Out of Food in the Last Year: Never true  Transportation Needs: No Transportation Needs (05/28/2019)   PRAPARE - Hydrologist (Medical): No    Lack of Transportation (Non-Medical): No  Physical Activity: Inactive (05/28/2019)   Exercise Vital Sign    Days of Exercise per Week: 0 days    Minutes of Exercise per Session: 0 min  Stress: Stress Concern Present (05/28/2019)   [employer]    Feeling of Stress : To some extent  Social Connections: Not on file     Family History: The patient's family history includes Alzheimer's disease in her father and paternal grandmother; Colon cancer in her maternal grandmother; Heart failure in her maternal  grandfather; Hyperlipidemia in her sister; Hypertension in her mother and sister; Lung cancer in her father and paternal grandfather; Migraines in her father. There is no history of Esophageal cancer, Inflammatory bowel disease, Liver disease, Pancreatic cancer, Rectal cancer, or Stomach cancer.  ROS:   Please see the history of present illness.     All other systems reviewed and are negative.  EKGs/Labs/Other Studies Reviewed:    The following studies were reviewed today:  Cath 03/30/2022   Mid LAD lesion is 25% stenosed.   Ramus lesion is 30% stenosed.   Non-stenotic Prox LAD to Mid LAD lesion with 0% stenosed side branch in 1st Sept was previously treated.   Non-stenotic Prox LAD lesion was previously treated.   1.  Widely patent proximal LAD stents with mild disease elsewhere. 2.  LVEDP of 10 mmHg.   Recommendation: Continue medical therapy and consider GI evaluation.   EKG:  EKG is not ordered today.    Recent Labs: 09/06/2021: ALT 14 03/22/2022: BUN 17; Creatinine, Ser 0.82; Hemoglobin 13.3; Platelets 158; Potassium 3.7; Sodium 141 03/23/2022: TSH 1.79  Recent Lipid Panel    Component Value Date/Time   CHOL 138 06/24/2020 1152   TRIG 151 (H) 06/24/2020 1152   HDL 60 06/24/2020 1152   CHOLHDL 2.3 06/24/2020 1152   CHOLHDL 5.5 04/25/2012 1406   VLDL 76 (H) 04/25/2012 1406   LDLCALC 53 06/24/2020 1152     Risk Assessment/Calculations:           Physical Exam:    VS:  BP 130/78   Pulse 76  Ht '5\' 7"'$  (1.702 m)   Wt 212 lb 9.6 oz (96.4 kg)   LMP 07/24/2003 (Within Months)   SpO2 97%   BMI 33.30 kg/m        Wt Readings from Last 3 Encounters:  04/24/22 212 lb 9.6 oz (96.4 kg)  04/18/22 211 lb 6.4 oz (95.9 kg)  04/09/22 209 lb (94.8 kg)     GEN:  Well nourished, well developed in no acute distress HEENT: Normal NECK: No JVD; No carotid bruits LYMPHATICS: No lymphadenopathy CARDIAC: RRR, no murmurs, rubs, gallops RESPIRATORY:  Clear to auscultation without  rales, wheezing or rhonchi  ABDOMEN: Soft, non-tender, non-distended MUSCULOSKELETAL:  No edema; No deformity  SKIN: Warm and dry NEUROLOGIC:  Alert and oriented x 3 PSYCHIATRIC:  Normal affect   ASSESSMENT:    1. Coronary artery disease involving native coronary artery of native heart without angina pectoris   2. Essential (primary) hypertension   3. Hyperlipidemia LDL goal <70   4. Preop cardiovascular exam    PLAN:    In order of problems listed above:  CAD: Although recently patient has some chest pain and the Myoview came back abnormal, subsequent cardiac catheterization revealed nonobstructive disease.  Patient's symptom may be GI in nature, no further cardiac work-up is planned at this point.  Continue Plavix monotherapy  Hypertension: Blood pressure stable  Hyperlipidemia: On Crestor  Preoperative clearance: Patient reportedly will need colonoscopy and back injection.  She is cleared to proceed with both procedures if needed.  She has been instructed to hold Plavix for 5 to 7 days prior to the procedures and restart as soon as possible afterward at the surgeon's discretion.           Medication Adjustments/Labs and Tests Ordered: Current medicines are reviewed at length with the patient today.  Concerns regarding medicines are outlined above.  No orders of the defined types were placed in this encounter.  No orders of the defined types were placed in this encounter.   Patient Instructions  Medication Instructions:  HOLD Plavix 5-7 days prior to upcoming procedure  Your physician recommends that you continue on your current medications as directed. Please refer to the Current Medication list given to you today.  *If you need a refill on your cardiac medications before your next appointment, please call your pharmacy*  Lab Work: NONE ordered at this time of appointment   If you have labs (blood work) drawn today and your tests are completely normal, you will  receive your results only by: [place] (if you have MyChart) OR A paper copy in the mail If you have any lab test that is abnormal or we need to change your treatment, we will call you to review the results.  Testing/Procedures: NONE ordered at this time of appointment   Follow-Up: At [facility], you and your health needs are our priority.  As part of our continuing mission to provide you with exceptional heart care, we have created designated Provider Care Teams.  These Care Teams include your primary Cardiologist (physician) and Advanced Practice Providers (APPs -  Physician Assistants and Nurse Practitioners) who all work together to provide you with the care you need, when you need it.  Your next appointment:   6-9 month(s)  The format for your next appointment:   In Person  Provider:   [name], MD     Other Instructions   Important Information About Sugar         Signed,  [name], [place]  04/26/2022 6:37 PM    [place]

## 2022-04-24 NOTE — Telephone Encounter (Signed)
I called, left voicemail for patient to return call.

## 2022-04-25 ENCOUNTER — Encounter: Payer: Medicare Other | Admitting: Physical Medicine and Rehabilitation

## 2022-04-25 ENCOUNTER — Encounter: Payer: Self-pay | Admitting: Physical Medicine and Rehabilitation

## 2022-04-26 ENCOUNTER — Other Ambulatory Visit: Payer: Medicare Other

## 2022-04-26 ENCOUNTER — Encounter: Payer: Self-pay | Admitting: Physician Assistant

## 2022-04-30 ENCOUNTER — Ambulatory Visit: Payer: Medicare Other | Admitting: Physician Assistant

## 2022-05-01 ENCOUNTER — Ambulatory Visit
Admission: RE | Admit: 2022-05-01 | Discharge: 2022-05-01 | Disposition: A | Discharge status: Home / Self Care | Payer: Medicare Other | Source: Ambulatory Visit | Attending: Orthopedic Surgery | Admitting: Orthopedic Surgery

## 2022-05-01 DIAGNOSIS — M25562 Pain in left knee: Secondary | ICD-10-CM

## 2022-05-04 ENCOUNTER — Ambulatory Visit (INDEPENDENT_AMBULATORY_CARE_PROVIDER_SITE_OTHER): Payer: Medicare Other | Admitting: Physician Assistant

## 2022-05-04 ENCOUNTER — Encounter: Payer: Self-pay | Admitting: Physician Assistant

## 2022-05-04 VITALS — BP 120/68 | HR 70 | Ht 67.0 in | Wt 214.0 lb

## 2022-05-04 DIAGNOSIS — Z9884 Bariatric surgery status: Secondary | ICD-10-CM

## 2022-05-04 DIAGNOSIS — I2089 Other forms of angina pectoris: Secondary | ICD-10-CM | POA: Diagnosis not present

## 2022-05-04 DIAGNOSIS — K289 Gastrojejunal ulcer, unspecified as acute or chronic, without hemorrhage or perforation: Secondary | ICD-10-CM | POA: Diagnosis not present

## 2022-05-04 DIAGNOSIS — R1013 Epigastric pain: Secondary | ICD-10-CM | POA: Diagnosis not present

## 2022-05-04 MED ORDER — SUCRALFATE 1 GM/10ML PO SUSP: ORAL | 4 refills | Status: DC

## 2022-05-04 MED ORDER — OMEPRAZOLE 40 MG PO CPDR: DELAYED_RELEASE_CAPSULE | ORAL | 6 refills | Status: DC

## 2022-05-04 NOTE — Progress Notes (Signed)
Subjective:    Patient ID: Carrie Reynolds, female    DOB: [birth year], 66 y.o.   MRN: [id number]  HPI  Carrie Reynolds is a pleasant 66 year old female, established with Dr. Rush Landmark, who was last seen on 03/23/2022 by Ellouise Newer, PA-C and comes back in today with persistent epigastric pain. Patient is status post Roux-en-Y gastric bypass several years ago, and has history of jejunal ulcers and gastrojejunostomy anastomotic laboratory changes/erosive changes without discrete ulcer both noted at the time of enteroscopy in December 2022. She had undergone EGD in April 2023 with finding of a healthy-appearing gastro jejunostomy anastomosis and evidence of healed jejunal ulcers. Colonoscopy was also done in April 2023 with removal of 6 polyps all 4 to 15 mm in size and by path noted to be sessile serrated polyps.  She had in addition a 30 mm polyp removed piecemeal from the descending colon which was also a sessile serrated polyp.  She is indicated for 1 year interval follow-up. When she was seen in September with persistent symptoms repeat EGD was to be scheduled, she was continued on twice daily Protonix, Carafate 4 times daily and added Pepcid.  She has not had any improvement in symptoms.  However she had developed some chest pain in the interim and while that being evaluated by cardiology as she has history of coronary disease and is status post prior drug-eluting stent to the LAD.  She had repeat cardiac cath 03/30/2022 with no obstructive disease and patent proximal LAD stent.  Today she says she is having persistent subxiphoid pain which she describes as a burning sensation that is bad if she has an empty stomach and definitely feels better with a full stomach.  She is distressed because she says she is eating more because she feels better with food in her stomach but is causing her to gain weight.  She is also having intermittent nausea without vomiting, continued alteration in bowel habits which is  not new for her no melena or hematochezia. She is currently taking the Carafate as needed has a prescription for viscous Xylocaine but did not find that helpful.  Continues on Protonix twice daily, denies any NSAID use.  She says she is miserable with these persistent symptoms and wants to have what ever she needs to have done in order to feel better.  Review of Systems Pertinent positive and negative review of systems were noted in the above HPI section.  All other review of systems was otherwise negative.   Outpatient Encounter Medications as of 05/04/2022  Medication Sig   acetaminophen (TYLENOL) 500 MG tablet Take 1,000 mg by mouth every 6 (six) hours as needed for mild pain, moderate pain or headache.   AMBULATORY NON FORMULARY MEDICATION Medication Name: GI Cocktail 90 ml Viscous Lidocaine, 90 ML Dicyclomine 10 mg/32m, 270 ML Maalox, Total of 450 ML. 5cc or 10 cc every 4 to 6 hours as needed for Epigastric pain.   cetirizine (ZYRTEC) 10 MG tablet Take 10 mg by mouth daily as needed for allergies.   clopidogrel (PLAVIX) 75 MG tablet TAKE 1 TABLET BY MOUTH EVERY DAY (Patient taking differently: Take 75 mg by mouth daily.)   diclofenac Sodium (VOLTAREN) 1 % GEL Apply 2 g topically daily as needed (Pain).   famotidine (PEPCID) 40 MG tablet Take 1 tablet (40 mg total) by mouth 2 (two) times daily.   ferrous sulfate 325 (65 FE) MG tablet Take 1 tablet (325 mg total) by mouth daily with breakfast.  hydrochlorothiazide (HYDRODIURIL) 12.5 MG tablet Take 12.5 mg by mouth daily.   isosorbide mononitrate (IMDUR) 30 MG 24 hr tablet Take 1 tablet (30 mg total) by mouth daily.   losartan-hydrochlorothiazide (HYZAAR) 100-12.5 MG tablet Take 0.5 tablets by mouth daily. (Patient taking differently: Take 1 tablet by mouth daily.)   nitroGLYCERIN (NITROSTAT) 0.4 MG SL tablet Place 1 tablet (0.4 mg total) under the tongue every 5 (five) minutes as needed.   omeprazole (PRILOSEC) 40 MG capsule Take 1 capsule  by mouth twice daily. May open capsule and place in applesauce or yogurt.   propranolol (INDERAL) 10 MG tablet May take  1 to 2  tablets a day  as needed for palpiptation (Patient taking differently: Take 10 mg by mouth every 8 (eight) hours as needed (panic attack).)   rosuvastatin (CRESTOR) 40 MG tablet Take 40 mg by mouth daily.   sertraline (ZOLOFT) 100 MG tablet Take 100-200 mg by mouth daily as needed (Mood).   sucralfate (CARAFATE) 1 GM/10ML suspension Take 1 gram (10 ml) by mouth four times daily, between meals and at bedtime.   [DISCONTINUED] pantoprazole (PROTONIX) 40 MG tablet Take 1 tablet (40 mg total) by mouth 2 (two) times daily.   Facility-Administered Encounter Medications as of 05/04/2022  Medication   sodium chloride flush (NS) 0.9 % injection 3 mL   Allergies  Allergen Reactions   Ceclor [Cefaclor] Hives and Rash   Tetracyclines & Related Hives   Aspirin Other (See Comments)    Patient reports she does not take -hx of stomach ulcer 06/2021   Lipitor [Atorvastatin Calcium] Cough   Penicillins Swelling and Other (See Comments)    Has patient had a PCN reaction causing immediate rash, facial/tongue/throat swelling, SOB or lightheadedness with hypotension: No Has patient had a PCN reaction causing severe rash involving mucus membranes or skin necrosis: No Has patient had a PCN reaction that required hospitalization: Yes Has patient had a PCN reaction occurring within the last 10 years: No If all of the above answers are "NO", then may proceed with Cephalosporin use.   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   Ulcer of small intestine 09/06/2021   Acute blood loss anemia 09/06/2021   Iron deficiency 09/06/2021   Colon cancer screening 09/06/2021   Long term (current) use of antithrombotics/antiplatelets  09/06/2021   Jejunal ulcer 08/21/2021   Syncope 07/15/2021   Sinus bradycardia 11/03/2019   Atypical angina 10/12/2019   Fatigue 10/12/2019   Presence of 2 overlapping Drug Coated Stents in LAD coronary artery    Coronary artery disease involving native coronary artery of native heart with angina pectoris ([place]) 06/17/2019   DOE (dyspnea on exertion) 06/16/2019   CAD S/P DES PCI-proximal LAD 05/20/2019   Agatston coronary artery calcium score greater than 400 04/16/2019   Migraine with vertigo 07/29/2018   Rapid palpitations 95/62/1308   Systolic murmur 65/78/4696   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   OSA on CPAP 11/19/2013   Hyperlipidemia with target LDL less than 70 07/29/2013   Obesity (BMI 30.0-34.9) 04/25/2012   Depression with anxiety 04/25/2012   Environmental allergies 04/25/2012   Essential (primary) hypertension 04/25/2012   Chiari malformation type I ([place]) 01/03/2012   Postmenopausal 09/18/2011   Social History   Socioeconomic History   Marital status: Married    Spouse name: Carrie Reynolds  Number of children: 2   Years of education: 12   Highest education level: High school graduate  Occupational History   Occupation: Retired Best boy: [employer]    Comment: Logistics, warehouse  Tobacco Use   Smoking status: Former    Packs/day: 0.10    Years: 30.00    Total pack years: 3.00    Types: Cigarettes    Quit date: 04/26/2012    Years since quitting: 10.0   Smokeless tobacco: Never  Vaping Use   Vaping Use: Never used  Substance and Sexual Activity   Alcohol use: Yes    Comment: occasional   Drug use: Yes    Types: Marijuana    Comment: weed juice   Sexual activity: Yes    Partners: Male    Birth control/protection: Surgical    Comment: hysterectomy  Other Topics Concern   Not on file  Social History Narrative   ** Merged History Encounter **       Lives with husband Carrie Reynolds). She travels very frequently with her job and is gone  most weeks out of the month. She is active and exercises when able.   Social Determinants of Health   Financial Resource Strain: Low Risk  (05/28/2019)   Overall Financial Resource Strain (CARDIA)    Difficulty of Paying Living Expenses: Not hard at all  Food Insecurity: No Food Insecurity (05/28/2019)   Hunger Vital Sign    Worried About Running Out of Food in the Last Year: Never true    Ran Out of Food in the Last Year: Never true  Transportation Needs: No Transportation Needs (05/28/2019)   PRAPARE - Hydrologist (Medical): No    Lack of Transportation (Non-Medical): No  Physical Activity: Inactive (05/28/2019)   Exercise Vital Sign    Days of Exercise per Week: 0 days    Minutes of Exercise per Session: 0 min  Stress: Stress Concern Present (05/28/2019)   [place]    Feeling of Stress : To some extent  Social Connections: Not on file  Intimate Partner Violence: Not on file    Carrie Reynolds's family history includes Alzheimer's disease in her father and paternal grandmother; Colon cancer in her maternal grandmother; Heart failure in her maternal grandfather; Hyperlipidemia in her sister; Hypertension in her mother and sister; Lung cancer in her father and paternal grandfather; Migraines in her father.      Objective:    Vitals:   05/04/22 0926  BP: 120/68  Pulse: 70  SpO2: 99%    Physical Exam  Well-developed well-nourished older white female in no acute distress.  Uncomfortable appearing height, Weight, 214 BMI 33.5  HEENT; nontraumatic normocephalic, EOMI, PE R LA, sclera anicteric. Oropharynx; not examined today Neck; supple, no JVD Cardiovascular; regular rate and rhythm with S1-S2, no murmur rub or gallop Pulmonary; Clear bilaterally Abdomen; soft, obese, she is tender in the epigastrium, nondistended, no palpable mass or hepatosplenomegaly, bowel sounds are active Rectal;  not done today Skin; benign exam, no jaundice rash or appreciable lesions Extremities; no clubbing cyanosis or edema skin warm and dry Neuro/Psych; alert and oriented x4, grossly nonfocal mood and affect appropriate       Assessment & Plan:   #43 66 year old white female status post Roux-en-Y gastric bypass, with history of jejunal ulcers and gastrojejunostomy anastomotic inflammatory changes at time of enteroscopy December 2022. Repeat EGD April 2023 showed healing of  the anastomotic mucosal changes and healing of the jejunal ulcers.  Patient has persistent complaints now of subxiphoid pain over the past 3 to 4 months with no improvement with twice daily PPI, as needed Carafate.  Persistent symptoms are concerning for recurrent ulcer disease  #2 history of multiple sessile serrated polyps including a 30 mm polyp removed piecemeal from the descending colon April 2023 Indicated for 1 year interval follow-up #3 coronary artery disease status postprior drug-eluting stent LAD maintained on Plavix Very recent cath September 2023 with no obstructive disease and patent LAD stent  #4 sleep apnea no oxygen use #5.  Hyperlipidemia #6.  Hypertension #7.  Status post cholecystectomy  Plan; will stop Protonix and switch to omeprazole 40 mg p.o. twice daily.  For better absorption status post gastric bypass have advised patient to open the capsule and take with applesauce or yogurt  Change Carafate to Carafate liquid 1 g p.o. 4 times daily between meals and at bedtime Does not need to continue Pepcid, or GI cocktail as not helping  Will schedule for EGD with Dr. Rush Landmark  in the [facility].  Procedure was discussed in detail with the patient including indications risk and benefits and she is agreeable to proceed. She will need to hold Plavix for 5 days prior to the procedure, we will communicate with her cardiologist to sure this is reasonable for this patient.  If EGD is unrevealing as to source of  her pain then she will need further cross-sectional abdominal imaging  She will be due for follow-up colonoscopy April 2024    Johnavon Mcclafferty Genia Harold PA-C 05/04/2022   Cc: Leonard Downing, *

## 2022-05-04 NOTE — Patient Instructions (Addendum)
You have been scheduled for an endoscopy. Please follow written instructions given to you at your visit today. If you use inhalers (even only as needed), please bring them with you on the day of your procedure.  Per cardiology 04/24/22 office note,  Preoperative clearance: Patient reportedly will need colonoscopy and back injection.  She is cleared to proceed with both procedures if needed.  She has been instructed to hold Plavix for 5 to 7 days prior to the procedures and restart as soon as possible afterward at the surgeon's discretion.  DISCONTINUE pantoprazole.  We have sent the following medications to your pharmacy for you to pick up at your convenience: Omeprazole 40 mg twice daily before meals- open capsules and mix in applesauce or yogurt to take x 1 month.  Carafate 1 gram four times daily between meals and at bedtime (changed to liquid form).  _______________________________________________________  If you are age 63 or older, your body mass index should be between 23-30. Your Body mass index is 33.52 kg/m. If this is out of the aforementioned range listed, please consider follow up with your Primary Care Provider.  If you are age 39 or younger, your body mass index should be between 19-25. Your Body mass index is 33.52 kg/m. If this is out of the aformentioned range listed, please consider follow up with your Primary Care Provider.   ________________________________________________________  The  GI providers would like to encourage you to use [facility] to communicate with providers for non-urgent requests or questions.  Due to long hold times on the telephone, sending your provider a message by [facility] may be a faster and more efficient way to get a response.  Please allow 48 business hours for a response.  Please remember that this is for non-urgent requests.  _______________________________________________________  Due to recent changes in healthcare laws, you may see the  results of your imaging and laboratory studies on MyChart before your provider has had a chance to review them.  We understand that in some cases there may be results that are confusing or concerning to you. Not all laboratory results come back in the same time frame and the provider may be waiting for multiple results in order to interpret others.  Please give [place] 48 hours in order for your provider to thoroughly review all the results before contacting the office for clarification of your results.

## 2022-05-05 NOTE — Progress Notes (Signed)
Attending Physician's Attestation   I have reviewed the chart.   I agree with the Advanced Practitioner's note, impression, and recommendations with any updates as below. If patient wants an earlier EGD spot, I do see availability sooner, if she chose that spot for a reason then OK to maintain.   Justice Britain, MD [facility] Advanced [facility]oscopy Office # [phone]

## 2022-05-10 ENCOUNTER — Ambulatory Visit (INDEPENDENT_AMBULATORY_CARE_PROVIDER_SITE_OTHER): Payer: Medicare Other | Admitting: Physical Medicine and Rehabilitation

## 2022-05-10 ENCOUNTER — Ambulatory Visit: Payer: Medicare Other

## 2022-05-10 VITALS — BP 134/87 | HR 73

## 2022-05-10 DIAGNOSIS — M5416 Radiculopathy, lumbar region: Secondary | ICD-10-CM

## 2022-05-10 MED ORDER — METHYLPREDNISOLONE ACETATE 80 MG/ML IJ SUSP
40.0000 mg | Freq: Once | INTRAMUSCULAR | Status: AC
  Administered 2022-05-10: 40 mg

## 2022-05-10 NOTE — Progress Notes (Signed)
Numeric Pain Rating Scale and Functional Assessment Average Pain 0   In the last MONTH (on 0-10 scale) has pain interfered with the following?  1. General activity like being  able to carry out your everyday physical activities such as walking, climbing stairs, carrying groceries, or moving a chair?  Rating( varies )   +Driver, -BT- Plavix, -Dye Allergies.  Unsure of what causes pain. Pain radiates down both legs. Tylenol for pain

## 2022-05-15 ENCOUNTER — Encounter: Payer: Self-pay | Admitting: Gastroenterology

## 2022-05-15 ENCOUNTER — Ambulatory Visit: Payer: Medicare Other | Admitting: Gastroenterology

## 2022-05-15 VITALS — BP 130/69 | HR 57 | Temp 98.7°F | Resp 16 | Ht 67.0 in | Wt 214.0 lb

## 2022-05-15 DIAGNOSIS — R1013 Epigastric pain: Secondary | ICD-10-CM

## 2022-05-15 DIAGNOSIS — K9189 Other postprocedural complications and disorders of digestive system: Secondary | ICD-10-CM | POA: Diagnosis not present

## 2022-05-15 DIAGNOSIS — K259 Gastric ulcer, unspecified as acute or chronic, without hemorrhage or perforation: Secondary | ICD-10-CM

## 2022-05-15 MED ORDER — SODIUM CHLORIDE 0.9 % IV SOLN: 500.0000 mL | Freq: Once | INTRAVENOUS | Status: DC

## 2022-05-15 NOTE — Progress Notes (Signed)
Dr Rush Landmark notified pt did not hold plavix

## 2022-05-15 NOTE — Progress Notes (Signed)
Called to room to assist during endoscopic procedure.  Patient ID and intended procedure confirmed with present staff. Received instructions for my participation in the procedure from the performing physician.  

## 2022-05-15 NOTE — Op Note (Signed)
[place] Patient Name: Carrie Reynolds Procedure Date: 05/15/2022 1:37 PM MRN: [id number] Endoscopist: Justice Britain , MD Age: 66 Referring MD:  Date of Birth: 11-15-1955 Gender: Female Account #: [id number] Procedure:                Upper GI endoscopy Indications:              Epigastric abdominal pain, Exclusion of                            gastrojejunal ulcer, Chest pain (non cardiac) Medicines:                Monitored Anesthesia Care Procedure:                Pre-Anesthesia Assessment:                           - Prior to the procedure, a History and Physical                            was performed, and patient medications and                            allergies were reviewed. The patient's tolerance of                            previous anesthesia was also reviewed. The risks                            and benefits of the procedure and the sedation                            options and risks were discussed with the patient.                            All questions were answered, and informed consent                            was obtained. Prior Anticoagulants: The patient has                            taken Plavix (clopidogrel), last dose was day of                            procedure. ASA Grade Assessment: III - A patient                            with severe systemic disease. After reviewing the                            risks and benefits, the patient was deemed in                            satisfactory condition to undergo the procedure.  After obtaining informed consent, the endoscope was                            passed under direct vision. Throughout the                            procedure, the patient's blood pressure, pulse, and                            oxygen saturations were monitored continuously. The                            GIF [id number] #[id number] was introduced through the                            mouth, and  advanced to the third part of duodenum.                            The upper GI endoscopy was accomplished without                            difficulty. The patient tolerated the procedure. Scope In: Scope Out: Findings:                 No gross lesions were noted in the entire esophagus.                           The Z-line was irregular and was found 39 cm from                            the incisors.                           Four non-bleeding linear gastric ulcers with a                            clean ulcer base (Forrest Class III) were found in                            the cardia. The largest lesion was 8 mm in largest                            dimension.                           No other gross lesions were noted in the entire                            examined stomach. Biopsies were taken with a cold                            forceps for histology and Helicobacter pylori  testing.                           Evidence of a Roux-en-Y anastomosis was found in                            the gastric body. This was characterized by                            ulceration in the region just distal to the gastric                            anastomosis within the jejunojejunal region                            (marginal). Biopsies were taken with a cold forceps                            for histology.                           Normal mucosa was found in the visualized jejunum. Complications:            No immediate complications. Estimated Blood Loss:     Estimated blood loss was minimal. Impression:               - No gross lesions in esophagus. Z-line irregular,                            39 cm from the incisors.                           - Non-bleeding gastric ulcers with a clean ulcer                            base (Forrest Class III) in the cardia. No other                            gross lesions in the stomach. Biopsied.                           - A  Roux-en-Y anastomosis was found, characterized                            by marginal ulceration. Biopsied.                           - Normal mucosa was found in the visualized jejunum. Recommendation:           - The patient will be observed post-procedure,                            until all discharge criteria are met.                           - Discharge patient  to home.                           - Patient has a contact number available for                            emergencies. The signs and symptoms of potential                            delayed complications were discussed with the                            patient. Return to normal activities tomorrow.                            Written discharge instructions were provided to the                            patient.                           - Resume previous diet.                           - No aspirin, ibuprofen, naproxen, or other                            non-steroidal anti-inflammatory drugs.                           - Await pathology results.                           - Continue Omeprazole 40 mg twice daily (open                            capsule and place granules in applesauce/yogurt).                           - Continue Carafate 4 times daily.                           - Continue Pepcid twice daily.                           - Go ahead and move forward with a CT-Abdomen to                            ensure nothing else is being missed, if she desires                            for further workup/management of pain.                           - If within the next 3-weeks she is still having  issues, then will need to consider PPI transition.                           - The findings and recommendations were discussed                            with the patient.                           - The findings and recommendations were discussed                            with the patient's  family. Justice Britain, MD 05/15/2022 2:08:01 PM

## 2022-05-15 NOTE — Progress Notes (Signed)
GASTROENTEROLOGY PROCEDURE H&P NOTE   Primary Care Physician: Leonard Downing, MD  HPI: Carrie Reynolds is a 66 y.o. female who presents for EGD for evaluation of abdominal pain and atypical chest pain.  Past Medical History:  Diagnosis Date   Anxiety    Arnold-Chiari malformation ([place]) 2006   CAD S/P PCI 05/06/2019   05/06/2019: prox LAD (@ SPI) PCI with Resolute Onyx DES 3.5  x 8, Residual 60% PDA, Normal LVF; 07/02/2019: relook Cath for Anterior Ischemia on [place] --> CATH 70% pre-stent/proximal edge dissection --> proximal overlapping DES (Resolute Onyx 3.5 x 8 - new & old stent post-dilated to 3.8 mm)   Cervical strain    syndrome   Cervicogenic headache    Chronic kidney disease    Concussion with loss of consciousness 10/09/2017   CTS (carpal tunnel syndrome)    Depression    GERD (gastroesophageal reflux disease)    Glucose intolerance (impaired glucose tolerance)    High coronary artery calcium score    Score of 1460.  Coronary CTA shows high-grade proximal-mid LAD stenosis (CT FFR 0.50).  Also PDA/PLA 60 to 70% stenosis (CTO FFR 0.75)   History of kidney stones    Hypertension    Migraine headache    with visual aura   Obesity    OSA (obstructive sleep apnea)    not treated   Postmenopausal    Sleep apnea    Vertigo    post concussive   Past Surgical History:  Procedure Laterality Date   BIOPSY  07/15/2021   Procedure: BIOPSY;  Surgeon: Rush Landmark Telford Nab., MD;  Location: [facility];  Service: Gastroenterology;;   Lillard Anes     right great toe   CHOLECYSTECTOMY N/A 06/05/2018   Procedure: LAPAROSCOPIC CHOLECYSTECTOMY WITH POSSIBLE INTRAOPERATIVE CHOLANGIOGRAM;  Surgeon: Erroll Luna, MD;  Location: [facility];  Service: General;  Laterality: N/A;   COLOSTOMY     CORONARY STENT INTERVENTION N/A 05/12/2019   Procedure: CORONARY STENT INTERVENTION;  Surgeon: Leonie Man, MD;  Location: [facility];  Service: Cardiovascular;;;  Culprit-p-mLAD 95% 950% SP1) --> Scoring Balloon PTCA -> DES PCI (Resolute DES 3.5 x 38 - 3.8 mm; 0% LAD& 55% SP1). -->  SP1 was somewhat jailed with TIMI II flow post PCI.   CORONARY STENT INTERVENTION N/A 07/02/2019   Procedure: CORONARY STENT INTERVENTION;  Surgeon: Leonie Man, MD;  Location: [facility];  Service: Cardiovascular;; * 70% Proximal stent edge dissection (edge of previous stent that is patent) -->  successfully covered with additional RESOLUTE ONYX DES 3.5 mm x 8 mm overlapping proximal edge of previous stent (postdilated to 3.8 mm)   ENTEROSCOPY N/A 07/15/2021   Procedure: ENTEROSCOPY;  Surgeon: Rush Landmark Telford Nab., MD;  Location: [facility] [facility];  Service: Gastroenterology;  Laterality: N/A;   GASTRIC BYPASS  11/2015   [facility]   GYNECOLOGIC CRYOSURGERY     HAND SURGERY     LEFT HEART CATH AND CORONARY ANGIOGRAPHY N/A 05/12/2019   Procedure: LEFT HEART CATH AND CORONARY ANGIOGRAPHY;  Surgeon: Leonie Man, MD;  Location: [facility];  Service: Cardiovascular;;; Culprit-p-mLAD 95% 950% SP1) --> DES PCI.;  RPAV 60% -small caliber vessel, did not appear flow-limiting (medical management).  EF 55 to 60%.  Normal LVEDP.   LEFT HEART CATH AND CORONARY ANGIOGRAPHY N/A 07/02/2019   Procedure: LEFT HEART CATH AND CORONARY ANGIOGRAPHY;  Surgeon: Leonie Man, MD;  Location: [facility];  Service: Cardiovascular;; CULPRIT = ~  70% proximal edge dissection of previous stent (DES PCI overlapping stent placed) - jails SP1 w/ ~60-70% ostial stenosis.[name] mLAD 25%. RI 30%, RPAV 60%.   LEFT HEART CATH AND CORONARY ANGIOGRAPHY N/A 03/30/2022   Procedure: LEFT HEART CATH AND CORONARY ANGIOGRAPHY;  Surgeon: Early Osmond, MD;  Location: [facility];  Service: Cardiovascular;  Laterality: N/A;   NM MYOVIEW LTD  06/24/2019   Carlton Adam): HIGH RISK.  EF 55%.  Large size, moderate severe defect in the anterior wall with associated anterior hypokinesis.  Suspect LAD  territory.   NM MYOVIEW LTD  10/23/2019   Normal EF 55 to 60%.  No EKG changes.  Small size mild severity fixed defect in the apical wall consistent with breast attenuation versus small prior infarct.  Marked improvement from anterior apical perfusion abnormality seen in February 2020.   TRANSTHORACIC ECHOCARDIOGRAM  12/08/2020   Normal LV Fxn - EF 60-65%. No RWMA. Mod Asymmetric Basal Septa LVH with mild Concentric LVH. Gr II DD. Normal Longitudinal Strain. Normal RV size & fxn - normal RAP/CVP.  Normal MV. Mild AoV sclerosis w/ stenosis. Mild Ascending Ao dilation - ~38 mm (borderline).  => No change from 04/2017.   TUBAL LIGATION Bilateral    UPPER GASTROINTESTINAL ENDOSCOPY     VAGINAL HYSTERECTOMY  2003   with LSO and right salpingectomy; right ovary still present   [facility] MONITOR-14-day  01/2021   Predominant rhythm sinus-heart rate range 40 to 124 bpm.  Average 70 bpm.  Rare PVCs and isolated PACs noted.  PAC couplets and triplets also noted as well as trigeminy.  3 atrial runs of 4-5 beats ranging from 102 to 150 bpm.  (2 of 3 noted on patient trigger.  Remainder patient triggers were sinus rhythm with PACs or PVCs); no atrial fibrillation or other sustained arrhythmias noted.   Current Outpatient Medications  Medication Sig Dispense Refill   clopidogrel (PLAVIX) 75 MG tablet TAKE 1 TABLET BY MOUTH EVERY DAY (Patient taking differently: Take 75 mg by mouth daily.) 90 tablet 3   famotidine (PEPCID) 40 MG tablet Take 1 tablet (40 mg total) by mouth 2 (two) times daily. 60 tablet 3   ferrous sulfate 325 (65 FE) MG tablet Take 1 tablet (325 mg total) by mouth daily with breakfast. 30 tablet 2   hydrochlorothiazide (HYDRODIURIL) 12.5 MG tablet Take 12.5 mg by mouth daily.     losartan-hydrochlorothiazide (HYZAAR) 100-12.5 MG tablet Take 0.5 tablets by mouth daily. (Patient taking differently: Take 1 tablet by mouth daily.) 30 tablet 11   propranolol (INDERAL) 10 MG tablet May take  1 to 2   tablets a day  as needed for palpiptation (Patient taking differently: Take 10 mg by mouth every 8 (eight) hours as needed (panic attack).) 30 tablet 11   rosuvastatin (CRESTOR) 40 MG tablet Take 40 mg by mouth daily.     sertraline (ZOLOFT) 100 MG tablet Take 100-200 mg by mouth daily as needed (Mood).     sucralfate (CARAFATE) 1 GM/10ML suspension Take 1 gram (10 ml) by mouth four times daily, between meals and at bedtime. 1200 mL 4   acetaminophen (TYLENOL) 500 MG tablet Take 1,000 mg by mouth every 6 (six) hours as needed for mild pain, moderate pain or headache.     AMBULATORY NON FORMULARY MEDICATION Medication Name: GI Cocktail 90 ml Viscous Lidocaine, 90 ML Dicyclomine 10 mg/49m, 270 ML Maalox, Total of 450 ML. 5cc or 10 cc every 4 to 6 hours as  needed for Epigastric pain. (Patient not taking: Reported on 05/15/2022) 450 mL 0   cetirizine (ZYRTEC) 10 MG tablet Take 10 mg by mouth daily as needed for allergies. (Patient not taking: Reported on 05/15/2022)     diclofenac Sodium (VOLTAREN) 1 % GEL Apply 2 g topically daily as needed (Pain). (Patient not taking: Reported on 05/15/2022)     isosorbide mononitrate (IMDUR) 30 MG 24 hr tablet Take 1 tablet (30 mg total) by mouth daily. (Patient not taking: Reported on 05/15/2022) 30 tablet 11   nitroGLYCERIN (NITROSTAT) 0.4 MG SL tablet Place 1 tablet (0.4 mg total) under the tongue every 5 (five) minutes as needed. 25 tablet 2   omeprazole (PRILOSEC) 40 MG capsule Take 1 capsule by mouth twice daily. May open capsule and place in applesauce or yogurt. 60 capsule 6   Current Facility-Administered Medications  Medication Dose Route Frequency Provider Last Rate Last Admin   0.9 %  sodium chloride infusion  500 mL Intravenous Once Mansouraty, Telford Nab., MD       methylPREDNISolone acetate (DEPO-MEDROL) injection 40 mg  40 mg Other Once Magnus Sinning, MD       sodium chloride flush (NS) 0.9 % injection 3 mL  3 mL Intravenous Q12H [name], [place]         Current Outpatient Medications:    clopidogrel (PLAVIX) 75 MG tablet, TAKE 1 TABLET BY MOUTH EVERY DAY (Patient taking differently: Take 75 mg by mouth daily.), Disp: 90 tablet, Rfl: 3   famotidine (PEPCID) 40 MG tablet, Take 1 tablet (40 mg total) by mouth 2 (two) times daily., Disp: 60 tablet, Rfl: 3   ferrous sulfate 325 (65 FE) MG tablet, Take 1 tablet (325 mg total) by mouth daily with breakfast., Disp: 30 tablet, Rfl: 2   hydrochlorothiazide (HYDRODIURIL) 12.5 MG tablet, Take 12.5 mg by mouth daily., Disp: , Rfl:    losartan-hydrochlorothiazide (HYZAAR) 100-12.5 MG tablet, Take 0.5 tablets by mouth daily. (Patient taking differently: Take 1 tablet by mouth daily.), Disp: 30 tablet, Rfl: 11   propranolol (INDERAL) 10 MG tablet, May take  1 to 2  tablets a day  as needed for palpiptation (Patient taking differently: Take 10 mg by mouth every 8 (eight) hours as needed (panic attack).), Disp: 30 tablet, Rfl: 11   rosuvastatin (CRESTOR) 40 MG tablet, Take 40 mg by mouth daily., Disp: , Rfl:    sertraline (ZOLOFT) 100 MG tablet, Take 100-200 mg by mouth daily as needed (Mood)., Disp: , Rfl:    sucralfate (CARAFATE) 1 GM/10ML suspension, Take 1 gram (10 ml) by mouth four times daily, between meals and at bedtime., Disp: 1200 mL, Rfl: 4   acetaminophen (TYLENOL) 500 MG tablet, Take 1,000 mg by mouth every 6 (six) hours as needed for mild pain, moderate pain or headache., Disp: , Rfl:    AMBULATORY NON FORMULARY MEDICATION, Medication Name: GI Cocktail 90 ml Viscous Lidocaine, 90 ML Dicyclomine 10 mg/58m, 270 ML Maalox, Total of 450 ML. 5cc or 10 cc every 4 to 6 hours as needed for Epigastric pain. (Patient not taking: Reported on 05/15/2022), Disp: 450 mL, Rfl: 0   cetirizine (ZYRTEC) 10 MG tablet, Take 10 mg by mouth daily as needed for allergies. (Patient not taking: Reported on 05/15/2022), Disp: , Rfl:    diclofenac Sodium (VOLTAREN) 1 % GEL, Apply 2 g topically daily as needed (Pain). (Patient  not taking: Reported on 05/15/2022), Disp: , Rfl:    isosorbide mononitrate (IMDUR) 30 MG 24 hr  tablet, Take 1 tablet (30 mg total) by mouth daily. (Patient not taking: Reported on 05/15/2022), Disp: 30 tablet, Rfl: 11   nitroGLYCERIN (NITROSTAT) 0.4 MG SL tablet, Place 1 tablet (0.4 mg total) under the tongue every 5 (five) minutes as needed., Disp: 25 tablet, Rfl: 2   omeprazole (PRILOSEC) 40 MG capsule, Take 1 capsule by mouth twice daily. May open capsule and place in applesauce or yogurt., Disp: 60 capsule, Rfl: 6  Current Facility-Administered Medications:    0.9 %  sodium chloride infusion, 500 mL, Intravenous, Once, Mansouraty, Telford Nab., MD   methylPREDNISolone acetate (DEPO-MEDROL) injection 40 mg, 40 mg, Other, Once, Magnus Sinning, MD   sodium chloride flush (NS) 0.9 % injection 3 mL, 3 mL, Intravenous, Q12H, [name], PA Allergies  Allergen Reactions   Ceclor [Cefaclor] Hives and Rash   Tetracyclines & Related Hives   Aspirin Other (See Comments)    Patient reports she does not take -hx of stomach ulcer 06/2021   Lipitor [Atorvastatin Calcium] Cough   Penicillins Swelling and Other (See Comments)    Has patient had a PCN reaction causing immediate rash, facial/tongue/throat swelling, SOB or lightheadedness with hypotension: No Has patient had a PCN reaction causing severe rash involving mucus membranes or skin necrosis: No Has patient had a PCN reaction that required hospitalization: Yes Has patient had a PCN reaction occurring within the last 10 years: No If all of the above answers are "NO", then may proceed with Cephalosporin use.   Amoxicillin Rash   Ampicillin Rash   Lisinopril Cough   Simvastatin Cough   Family History  Problem Relation Age of Onset   Hypertension Mother    Lung cancer Father    Alzheimer's disease Father    Migraines Father    Hyperlipidemia Sister    Hypertension Sister    Colon cancer Maternal Grandmother    Heart failure Maternal  Grandfather    Alzheimer's disease Paternal Grandmother    Lung cancer Paternal Grandfather    Esophageal cancer Neg Hx    Inflammatory bowel disease Neg Hx    Liver disease Neg Hx    Pancreatic cancer Neg Hx    Rectal cancer Neg Hx    Stomach cancer Neg Hx    Social History   Socioeconomic History   Marital status: Married    Spouse name: Edelyn Heidel   Number of children: 2   Years of education: 12   Highest education level: High school graduate  Occupational History   Occupation: Retired Best boy: [employer]    Comment: Chief Executive Officer, warehouse  Tobacco Use   Smoking status: Former    Packs/day: 0.10    Years: 30.00    Total pack years: 3.00    Types: Cigarettes    Quit date: 04/26/2012    Years since quitting: 10.0   Smokeless tobacco: Never  Vaping Use   Vaping Use: Never used  Substance and Sexual Activity   Alcohol use: Yes    Comment: occasional   Drug use: Yes    Types: Marijuana    Comment: weed juice   Sexual activity: Yes    Partners: Male    Birth control/protection: Surgical    Comment: hysterectomy  Other Topics Concern   Not on file  Social History Narrative   ** Merged History Encounter **       Lives with husband [name]). She travels very frequently with her job and is gone most weeks out of the  month. She is active and exercises when able.   Social Determinants of Health   Financial Resource Strain: Low Risk  (05/28/2019)   Overall Financial Resource Strain (CARDIA)    Difficulty of Paying Living Expenses: Not hard at all  Food Insecurity: No Food Insecurity (05/28/2019)   Hunger Vital Sign    Worried About Running Out of Food in the Last Year: Never true    Ran Out of Food in the Last Year: Never true  Transportation Needs: No Transportation Needs (05/28/2019)   PRAPARE - Hydrologist (Medical): No    Lack of Transportation (Non-Medical): No  Physical Activity: Inactive (05/28/2019)    Exercise Vital Sign    Days of Exercise per Week: 0 days    Minutes of Exercise per Session: 0 min  Stress: Stress Concern Present (05/28/2019)   [place]    Feeling of Stress : To some extent  Social Connections: Not on file  Intimate Partner Violence: Not on file    Physical Exam: Today's Vitals   05/15/22 1234 05/15/22 1240  BP: (!) 141/79   Pulse: (!) 55   Temp: 98.7 F (37.1 C) 98.7 F (37.1 C)  TempSrc: Skin   SpO2: 99%   Weight: 214 lb (97.1 kg)   Height: '5\' 7"'$  (1.702 m)    Body mass index is 33.52 kg/m. GEN: NAD EYE: Sclerae anicteric ENT: MMM CV: Non-tachycardic GI: Soft, NT/ND NEURO:  Alert & Oriented x 3  Lab Results: No results for input(s): "WBC", "HGB", "HCT", "PLT" in the last 72 hours. BMET No results for input(s): "NA", "K", "CL", "CO2", "GLUCOSE", "BUN", "CREATININE", "CALCIUM" in the last 72 hours. LFT No results for input(s): "PROT", "ALBUMIN", "AST", "ALT", "ALKPHOS", "BILITOT", "BILIDIR", "IBILI" in the last 72 hours. PT/INR No results for input(s): "LABPROT", "INR" in the last 72 hours.   Impression / Plan: This is a 66 y.o.female who presents for EGD for evaluation of abdominal pain and atypical chest pain.  The risks and benefits of endoscopic evaluation/treatment were discussed with the patient and/or family; these include but are not limited to the risk of perforation, infection, bleeding, missed lesions, lack of diagnosis, severe illness requiring hospitalization, as well as anesthesia and sedation related illnesses.  The patient's history has been reviewed, patient examined, no change in status, and deemed stable for procedure.  The patient and/or family is agreeable to proceed.    Justice Britain, MD [facility] Office # [phone]

## 2022-05-15 NOTE — Patient Instructions (Addendum)
- Resume previous diet.  - No aspirin, ibuprofen, naproxen, or other non-steroidal anti-inflammatory drugs.  - Continue Omeprazole 40 mg twice daily (open capsule and place granules in applesauce/yogurt).  - Continue Carafate 4 times daily.  - Continue Pepcid twice daily.  - Go ahead and move forward with a CT-Abdomen to ensure nothing else is being missed, if she desires for further workup/management of pain.  - If within the next 3-weeks she is still having issues, then will need to consider PPI transition.  YOU HAD AN ENDOSCOPIC PROCEDURE TODAY AT [facility]:   Refer to the procedure report that was given to you for any specific questions about what was found during the examination.  If the procedure report does not answer your questions, please call your gastroenterologist to clarify.  If you requested that your care partner not be given the details of your procedure findings, then the procedure report has been included in a sealed envelope for you to review at your convenience later.  YOU SHOULD EXPECT: Some feelings of bloating in the abdomen. Passage of more gas than usual.  Walking can help get rid of the air that was put into your GI tract during the procedure and reduce the bloating. If you had a lower endoscopy (such as a colonoscopy or flexible sigmoidoscopy) you may notice spotting of blood in your stool or on the toilet paper. If you underwent a bowel prep for your procedure, you may not have a normal bowel movement for a few days.  Please Note:  You might notice some irritation and congestion in your nose or some drainage.  This is from the oxygen used during your procedure.  There is no need for concern and it should clear up in a day or so.  SYMPTOMS TO REPORT IMMEDIATELY:  Following upper endoscopy (EGD)  Vomiting of blood or coffee ground material  New chest pain or pain under the shoulder blades  Painful or persistently difficult swallowing  New  shortness of breath  Fever of 100F or higher  Black, tarry-looking stools  For urgent or emergent issues, a gastroenterologist can be reached at any hour by calling [phone]. Do not use MyChart messaging for urgent concerns.   DIET:  We do recommend a small meal at first, but then you may proceed to your regular diet.  Drink plenty of fluids but you should avoid alcoholic beverages for 24 hours.  ACTIVITY:  You should plan to take it easy for the rest of today and you should NOT DRIVE or use heavy machinery until tomorrow (because of the sedation medicines used during the test).    FOLLOW UP: Our staff will call the number listed on your records the next business day following your procedure.  We will call around 7:15- 8:00 am to check on you and address any questions or concerns that you may have regarding the information given to you following your procedure. If we do not reach you, we will leave a message.     If any biopsies were taken you will be contacted by phone or by letter within the next 1-3 weeks.  Please call us at [phone] if you have not heard about the biopsies in 3 weeks.    SIGNATURES/CONFIDENTIALITY: You and/or your care partner have signed paperwork which will be entered into your electronic medical record.  These signatures attest to the fact that that the information above on your After Visit Summary has been reviewed and is  understood.  Full responsibility of the confidentiality of this discharge information lies with you and/or your care-partner.

## 2022-05-15 NOTE — Progress Notes (Signed)
Report to PACU, RN, vss, BBS= Clear.  

## 2022-05-16 ENCOUNTER — Telehealth: Payer: Self-pay

## 2022-05-16 NOTE — Telephone Encounter (Signed)
  Follow up Call-     05/15/2022   12:40 PM 10/24/2021    7:20 AM  Call back number  Post procedure Call Back phone  # [phone]  Permission to leave phone message Yes Yes     Patient questions:  Do you have a fever, pain , or abdominal swelling? No. Pain Score  0 *  Have you tolerated food without any problems? Yes.    Have you been able to return to your normal activities? Yes.    Do you have any questions about your discharge instructions: Diet   No. Medications  No. Follow up visit  No.  Do you have questions or concerns about your Care? No.  Actions: * If pain score is 4 or above: No action needed, pain <4.

## 2022-05-18 ENCOUNTER — Ambulatory Visit (INDEPENDENT_AMBULATORY_CARE_PROVIDER_SITE_OTHER): Payer: Medicare Other | Admitting: Surgical

## 2022-05-18 DIAGNOSIS — I2089 Other forms of angina pectoris: Secondary | ICD-10-CM

## 2022-05-18 DIAGNOSIS — M1712 Unilateral primary osteoarthritis, left knee: Secondary | ICD-10-CM

## 2022-05-20 ENCOUNTER — Encounter: Payer: Self-pay | Admitting: Orthopedic Surgery

## 2022-05-20 MED ORDER — BUPIVACAINE HCL 0.25 % IJ SOLN
4.0000 mL | INTRAMUSCULAR | Status: AC | PRN
  Administered 2022-05-18: 4 mL via INTRA_ARTICULAR

## 2022-05-20 MED ORDER — METHYLPREDNISOLONE ACETATE 40 MG/ML IJ SUSP
40.0000 mg | INTRAMUSCULAR | Status: AC | PRN
  Administered 2022-05-18: 40 mg via INTRA_ARTICULAR

## 2022-05-20 MED ORDER — LIDOCAINE HCL 1 % IJ SOLN
5.0000 mL | INTRAMUSCULAR | Status: AC | PRN
  Administered 2022-05-18: 5 mL

## 2022-05-20 NOTE — Progress Notes (Signed)
Carrie Reynolds - 66 y.o. female MRN [id number]  Date of birth: [birth year]  Office Visit Note: Visit Date: 05/10/2022 PCP: Leonard Downing, MD Referred by: Leonard Downing, *  Subjective: Chief Complaint  Patient presents with   Lower Back - Pain   HPI:  Azani Brogdon is a 66 y.o. female who comes in today at the request of Dr. Anderson Malta for planned Left L4-5 Lumbar Transforaminal epidural steroid injection with fluoroscopic guidance.  The patient has failed conservative care including home exercise, medications, time and activity modification.  This injection will be diagnostic and hopefully therapeutic.  Please see requesting physician notes for further details and justification.   ROS Otherwise per HPI.  Assessment & Plan: Visit Diagnoses:    ICD-10-CM   1. Lumbar radiculopathy  M54.16 XR C-ARM NO REPORT    Epidural Steroid injection    methylPREDNISolone acetate (DEPO-MEDROL) injection 40 mg      Plan: No additional findings.   Meds & Orders:  Meds ordered this encounter  Medications   methylPREDNISolone acetate (DEPO-MEDROL) injection 40 mg    Orders Placed This Encounter  Procedures   XR C-ARM NO REPORT   Epidural Steroid injection    Follow-up: Return for visit to requesting provider as needed.   Procedures: No procedures performed  Lumbosacral Transforaminal Epidural Steroid Injection - Sub-Pedicular Approach with Fluoroscopic Guidance  Patient: Tylena Prisk      Date of Birth: 11/09/1955 MRN: [id number] PCP: Leonard Downing, MD      Visit Date: 05/10/2022   Universal Protocol:    Date/Time: 05/10/2022  Consent Given By: the patient  Position: PRONE  Additional Comments: Vital signs were monitored before and after the procedure. Patient was prepped and draped in the usual sterile fashion. The correct patient, procedure, and site was verified.   Injection Procedure Details:   Procedure diagnoses: Lumbar  radiculopathy [M54.16]    Meds Administered:  Meds ordered this encounter  Medications   methylPREDNISolone acetate (DEPO-MEDROL) injection 40 mg    Laterality: Left  Location/Site: L4  Needle:5.0 in., 22 ga.  Short bevel or Quincke spinal needle  Needle Placement: Transforaminal  Findings:    -Comments: Excellent flow of contrast along the nerve, nerve root and into the epidural space.  Procedure Details: After squaring off the end-plates to get a true AP view, the C-arm was positioned so that an oblique view of the foramen as noted above was visualized. The target area is just inferior to the "nose of the scotty dog" or sub pedicular. The soft tissues overlying this structure were infiltrated with 2-3 ml. of 1% Lidocaine without Epinephrine.  The spinal needle was inserted toward the target using a "trajectory" view along the fluoroscope beam.  Under AP and lateral visualization, the needle was advanced so it did not puncture dura and was located close the 6 O'Clock position of the pedical in AP tracterory. Biplanar projections were used to confirm position. Aspiration was confirmed to be negative for CSF and/or blood. A 1-2 ml. volume of Isovue-250 was injected and flow of contrast was noted at each level. Radiographs were obtained for documentation purposes.   After attaining the desired flow of contrast documented above, a 0.5 to 1.0 ml test dose of 0.25% Marcaine was injected into each respective transforaminal space.  The patient was observed for 90 seconds post injection.  After no sensory deficits were reported, and normal lower extremity motor function was noted,   the above  injectate was administered so that equal amounts of the injectate were placed at each foramen (level) into the transforaminal epidural space.   Additional Comments:  The patient tolerated the procedure well Dressing: 2 x 2 sterile gauze and Band-Aid    Post-procedure details: Patient was observed  during the procedure. Post-procedure instructions were reviewed.  Patient left the clinic in stable condition.    Clinical History: MRI LUMBAR SPINE FINDINGS   Segmentation: Assumed standard. The last well-formed disc space is designated L5-S1 for the purposes of this report.   Alignment:  Physiologic.   Vertebrae: No fracture, evidence of discitis, or bone lesion. Small hemangioma in the L1 vertebral body.   Conus medullaris and cauda equina: Conus extends to the L1 level. Conus and cauda equina appear normal.   Paraspinal and other soft tissues: Negative.   Disc levels:   T12-L1:  Negative.   L1-L2:  Negative.   L2-L3:  Negative.   L3-L4: Small disc bulge, asymmetric to the left, with annular fissure. Moderate right greater than left facet arthropathy with small right facet joint effusion. No stenosis.   L4-L5: Small disc bulge and annular fissure. Moderate bilateral facet arthropathy with ligamentum flavum hypertrophy. Small right synovial cyst projecting into the paraspinous soft tissues. Mild bilateral neuroforaminal stenosis. No spinal canal stenosis.   L5-S1:  Mild to moderate bilateral facet arthropathy.  No stenosis.   IMPRESSION: 1. No acute traumatic injury within the cervical, thoracic, or lumbar spine. 2. Mild degenerative disc disease at C4-C5 and C5-C6. 3. Mild degenerative disc disease and moderate bilateral facet arthropathy at L3-L4 and L4-L5. Mild bilateral neuroforaminal stenosis at L4-L5. 4. 2.3 cm indeterminate right adrenal gland nodule. Recommend nonemergent adrenal protocol CT with and without contrast for further evaluation. This recommendation follows ACR consensus guidelines: Management of Incidental Adrenal Masses: A White Paper of the ACR Incidental Findings Committee. J Am Coll Radiol 2017;14:1038-1044.     Electronically Signed   By: Titus Dubin M.D.   On: 10/17/2017 16:56     Objective:  VS:  HT:    WT:   BMI:      BP:134/87  HR:73bpm  TEMP: ( )  RESP:  Physical Exam Vitals and nursing note reviewed.  Constitutional:      General: She is not in acute distress.    Appearance: Normal appearance. She is not ill-appearing.  HENT:     Head: Normocephalic and atraumatic.     Right Ear: External ear normal.     Left Ear: External ear normal.  Eyes:     Extraocular Movements: Extraocular movements intact.  Cardiovascular:     Rate and Rhythm: Normal rate.     Pulses: Normal pulses.  Pulmonary:     Effort: Pulmonary effort is normal. No respiratory distress.  Abdominal:     General: There is no distension.     Palpations: Abdomen is soft.  Musculoskeletal:        General: Tenderness present.     Cervical back: Neck supple.     Right lower leg: No edema.     Left lower leg: No edema.     Comments: Patient has good distal strength with no pain over the greater trochanters.  No clonus or focal weakness.  Skin:    Findings: No erythema, lesion or rash.  Neurological:     General: No focal deficit present.     Mental Status: She is alert and oriented to person, place, and time.     Sensory:  No sensory deficit.     Motor: No weakness or abnormal muscle tone.     Coordination: Coordination normal.  Psychiatric:        Mood and Affect: Mood normal.        Behavior: Behavior normal.      Imaging: No results found.

## 2022-05-20 NOTE — Progress Notes (Signed)
Office Visit Note   Patient: Analiz Tvedt           Date of Birth: 16-Sep-1955           MRN: [id number] Visit Date: 05/18/2022 Requested by: Leonard Downing, MD [address] [place],  [place] [address] PCP: Leonard Downing, MD  Subjective: Chief Complaint  Patient presents with   Other     Scan review    HPI: Stephanie Littman is a 66 y.o. female who presents to the office for MRI review. Patient denies any changes in symptoms.  Continues to complain mainly of medial sided left knee pain.  She has pain daily.  Pain does not wake her up at night.  She is using ice and heat as well as Tylenol.  Cannot take NSAIDs due to her medical history.  Pain is worse with activity and if she is very active the knee will tend to swell up a little bit and cause increased pain that throbs especially at night.  Recently saw Dr. Burney Gauze who recommended CBD oil which she has been using for her hand as well as for her knee with some relief.  MRI results revealed: MR Knee Left w/o contrast  Result Date: 05/02/2022 CLINICAL DATA:  Anterior left knee pain for 4 months EXAM: MRI OF THE LEFT KNEE WITHOUT CONTRAST TECHNIQUE: Multiplanar, multisequence MR imaging of the knee was performed. No intravenous contrast was administered. COMPARISON:  X-ray 04/09/2022 FINDINGS: MENISCI Medial meniscus:  Intact. Lateral meniscus: Complex degenerative tearing/maceration of the lateral meniscal body and anterior horn. LIGAMENTS Cruciates: Intact ACL and PCL. Collaterals: Intact MCL. Lateral collateral ligament complex intact. Soft tissue edema interposed between the distal IT band and lateral femoral condyle. CARTILAGE Patellofemoral: Mild partial-thickness chondral thinning within the patellofemoral compartment, most pronounced within the central trochlea. Medial:  Mild chondral thinning without focal defect. Lateral: Moderate-advanced cartilage thinning and surface irregularity with areas of  full-thickness cartilage loss of the weight-bearing surfaces. MISCELLANEOUS Joint:  Small joint effusion. Fat pads within normal limits. Popliteal Fossa: Moderate-sized Baker's cyst. Intact popliteus tendon. Extensor Mechanism:  Intact quadriceps and patellar tendons. Bones: No acute fracture. No dislocation. Tricompartmental joint space narrowing with marginal osteophyte formation. Reactive subchondral marrow signal changes in the lateral compartment. No marrow replacing bone lesion. Other: No significant periarticular soft tissue findings. IMPRESSION: 1. Complex degenerative tearing/maceration of the lateral meniscal body and anterior horn. 2. Tricompartmental osteoarthritis, most severe in the lateral compartment. 3. Soft tissue edema interposed between the distal IT band and lateral femoral condyle, which can be seen in the setting of iliotibial band syndrome. 4. Small joint effusion and moderate-sized Baker's cyst. Electronically Signed   By: Davina Poke D.O.   On: 05/02/2022 11:12                 ROS: All systems reviewed are negative as they relate to the chief complaint within the history of present illness.  Patient denies fevers or chills.  Assessment & Plan: Visit Diagnoses:  1. Primary osteoarthritis of left knee     Plan: Averyanna Sax is a 66 y.o. female who presents to the office for review of left knee MRI scan.  MRI demonstrates complex tearing of the lateral meniscus with tricompartmental osteoarthritis that is primarily in the lateral compartment.  Interestingly, most of her pain is actually in the anterior medial aspect of the knee.  Does not really have any lateral joint line tenderness on today's exam.  After discussion of options, she would like to try cortisone injection.  She tolerated the procedure well.  She will follow-up with the office as needed if pain does not improve for any significant amount of time after injection.  Follow-Up Instructions: No follow-ups on  file.   Orders:  No orders of the defined types were placed in this encounter.  No orders of the defined types were placed in this encounter.     Procedures: Large Joint Inj: L knee on 05/18/2022 2:08 PM Indications: diagnostic evaluation, joint swelling and pain Details: 18 G 1.5 in needle, superolateral approach  Arthrogram: No  Medications: 5 mL lidocaine 1 %; 40 mg methylPREDNISolone acetate 40 MG/ML; 4 mL bupivacaine 0.25 % Outcome: tolerated well, no immediate complications Procedure, treatment alternatives, risks and benefits explained, specific risks discussed. Consent was given by the patient. Immediately prior to procedure a time out was called to verify the correct patient, procedure, equipment, support staff and site/side marked as required. Patient was prepped and draped in the usual sterile fashion.       Clinical Data: No additional findings.  Objective: Vital Signs: LMP 07/24/2003 (Within Months)   Physical Exam:  Constitutional: Patient appears well-developed HEENT:  Head: Normocephalic Eyes:EOM are normal Neck: Normal range of motion Cardiovascular: Normal rate Pulmonary/chest: Effort normal Neurologic: Patient is alert Skin: Skin is warm Psychiatric: Patient has normal mood and affect  Ortho Exam: Ortho exam demonstrates left knee without effusion.  Tenderness over the medial joint line but no tenderness over the lateral joint line.  She is no calf tenderness.  Negative Homans' sign.  No pain with hip range of motion.  She is able to perform straight leg raise without extensor lag.  0 degrees extension and about 120 degrees of knee flexion.  No cellulitis or skin changes noted.  Specialty Comments:  MRI LUMBAR SPINE FINDINGS   Segmentation: Assumed standard. The last well-formed disc space is designated L5-S1 for the purposes of this report.   Alignment:  Physiologic.   Vertebrae: No fracture, evidence of discitis, or bone lesion.  Small hemangioma in the L1 vertebral body.   Conus medullaris and cauda equina: Conus extends to the L1 level. Conus and cauda equina appear normal.   Paraspinal and other soft tissues: Negative.   Disc levels:   T12-L1:  Negative.   L1-L2:  Negative.   L2-L3:  Negative.   L3-L4: Small disc bulge, asymmetric to the left, with annular fissure. Moderate right greater than left facet arthropathy with small right facet joint effusion. No stenosis.   L4-L5: Small disc bulge and annular fissure. Moderate bilateral facet arthropathy with ligamentum flavum hypertrophy. Small right synovial cyst projecting into the paraspinous soft tissues. Mild bilateral neuroforaminal stenosis. No spinal canal stenosis.   L5-S1:  Mild to moderate bilateral facet arthropathy.  No stenosis.   IMPRESSION: 1. No acute traumatic injury within the cervical, thoracic, or lumbar spine. 2. Mild degenerative disc disease at C4-C5 and C5-C6. 3. Mild degenerative disc disease and moderate bilateral facet arthropathy at L3-L4 and L4-L5. Mild bilateral neuroforaminal stenosis at L4-L5. 4. 2.3 cm indeterminate right adrenal gland nodule. Recommend nonemergent adrenal protocol CT with and without contrast for further evaluation. This recommendation follows ACR consensus guidelines: Management of Incidental Adrenal Masses: A White Paper of the ACR Incidental Findings Committee. J Am Coll Radiol 2017;14:1038-1044.     Electronically Signed   By: Titus Dubin M.D.   On: 10/17/2017 16:56  Imaging: No results found.  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   Ulcer of small intestine 09/06/2021   Acute blood loss anemia 09/06/2021   Iron deficiency 09/06/2021   Colon cancer screening 09/06/2021   Long term (current) use of antithrombotics/antiplatelets 09/06/2021   Jejunal ulcer 08/21/2021   Syncope  07/15/2021   Sinus bradycardia 11/03/2019   Atypical angina 10/12/2019   Fatigue 10/12/2019   Presence of 2 overlapping Drug Coated Stents in LAD coronary artery    Coronary artery disease involving native coronary artery of native heart with angina pectoris ([place]) 06/17/2019   DOE (dyspnea on exertion) 06/16/2019   CAD S/P DES PCI-proximal LAD 05/20/2019   Agatston coronary artery calcium score greater than 400 04/16/2019   Migraine with vertigo 07/29/2018   Rapid palpitations 43/15/4008   Systolic murmur 67/61/9509   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   OSA on CPAP 11/19/2013   Hyperlipidemia with target LDL less than 70 07/29/2013   Obesity (BMI 30.0-34.9) 04/25/2012   Depression with anxiety 04/25/2012   Environmental allergies 04/25/2012   Essential (primary) hypertension 04/25/2012   Chiari malformation type I ([place]) 01/03/2012   Postmenopausal 09/18/2011   Past Medical History:  Diagnosis Date   Anxiety    Arnold-Chiari malformation ([place]) 2006   CAD S/P PCI 05/06/2019   05/06/2019: prox LAD (@ SPI) PCI with Resolute Onyx DES 3.5  x 8, Residual 60% PDA, Normal LVF; 07/02/2019: relook Cath for Anterior Ischemia on [place] --> CATH 70% pre-stent/proximal edge dissection --> proximal overlapping DES (Resolute Onyx 3.5 x 8 - new & old stent post-dilated to 3.8 mm)   Cervical strain    syndrome   Cervicogenic headache    Chronic kidney disease    Concussion with loss of consciousness 10/09/2017   CTS (carpal tunnel syndrome)    Depression    GERD (gastroesophageal reflux disease)    Glucose intolerance (impaired glucose tolerance)    High coronary artery calcium score    Score of 1460.  Coronary CTA shows high-grade proximal-mid LAD stenosis (CT FFR 0.50).  Also PDA/PLA 60 to 70% stenosis (CTO FFR 0.75)   History of kidney stones    Hypertension    Migraine headache    with visual aura   Obesity    OSA (obstructive sleep apnea)    not treated   Postmenopausal    Sleep apnea     Vertigo    post concussive    Family History  Problem Relation Age of Onset   Hypertension Mother    Lung cancer Father    Alzheimer's disease Father    Migraines Father    Hyperlipidemia Sister    Hypertension Sister    Colon cancer Maternal Grandmother    Heart failure Maternal Grandfather    Alzheimer's disease Paternal Grandmother    Lung cancer Paternal Grandfather    Esophageal cancer Neg Hx    Inflammatory bowel disease Neg Hx    Liver disease Neg Hx    Pancreatic cancer Neg Hx    Rectal cancer Neg Hx    Stomach cancer Neg Hx     Past Surgical History:  Procedure Laterality Date   BIOPSY  07/15/2021   Procedure: BIOPSY;  Surgeon: Mansouraty, Telford Nab., MD;  Location: [facility];  Service: Gastroenterology;;   BUNIONECTOMY     right great toe   CHOLECYSTECTOMY N/A 06/05/2018   Procedure: LAPAROSCOPIC CHOLECYSTECTOMY WITH POSSIBLE INTRAOPERATIVE CHOLANGIOGRAM;  Surgeon: Brantley Stage,  Marcello Moores, MD;  Location: [place];  Service: General;  Laterality: N/A;   COLOSTOMY     CORONARY STENT INTERVENTION N/A 05/12/2019   Procedure: CORONARY STENT INTERVENTION;  Surgeon: Leonie Man, MD;  Location: [place] [facility];  Service: Cardiovascular;;; Culprit-p-mLAD 95% 950% SP1) --> Scoring Balloon PTCA -> DES PCI (Resolute DES 3.5 x 38 - 3.8 mm; 0% LAD& 55% SP1). -->  SP1 was somewhat jailed with TIMI II flow post PCI.   CORONARY STENT INTERVENTION N/A 07/02/2019   Procedure: CORONARY STENT INTERVENTION;  Surgeon: Leonie Man, MD;  Location: [place] [facility];  Service: Cardiovascular;; * 70% Proximal stent edge dissection (edge of previous stent that is patent) -->  successfully covered with additional RESOLUTE ONYX DES 3.5 mm x 8 mm overlapping proximal edge of previous stent (postdilated to 3.8 mm)   ENTEROSCOPY N/A 07/15/2021   Procedure: ENTEROSCOPY;  Surgeon: Rush Landmark Telford Nab., MD;  Location: [place];  Service: Gastroenterology;  Laterality: N/A;   GASTRIC  BYPASS  11/2015   [facility]   GYNECOLOGIC CRYOSURGERY     HAND SURGERY     LEFT HEART CATH AND CORONARY ANGIOGRAPHY N/A 05/12/2019   Procedure: LEFT HEART CATH AND CORONARY ANGIOGRAPHY;  Surgeon: Leonie Man, MD;  Location: [place] [facility];  Service: Cardiovascular;;; Culprit-p-mLAD 95% 950% SP1) --> DES PCI.;  RPAV 60% -small caliber vessel, did not appear flow-limiting (medical management).  EF 55 to 60%.  Normal LVEDP.   LEFT HEART CATH AND CORONARY ANGIOGRAPHY N/A 07/02/2019   Procedure: LEFT HEART CATH AND CORONARY ANGIOGRAPHY;  Surgeon: Leonie Man, MD;  Location: [place] [facility];  Service: Cardiovascular;; CULPRIT = ~70% proximal edge dissection of previous stent (DES PCI overlapping stent placed) - jails SP1 w/ ~60-70% ostial stenosis.[name] mLAD 25%. RI 30%, RPAV 60%.   LEFT HEART CATH AND CORONARY ANGIOGRAPHY N/A 03/30/2022   Procedure: LEFT HEART CATH AND CORONARY ANGIOGRAPHY;  Surgeon: Early Osmond, MD;  Location: [place] [facility];  Service: Cardiovascular;  Laterality: N/A;   NM MYOVIEW LTD  06/24/2019   Carlton Adam): HIGH RISK.  EF 55%.  Large size, moderate severe defect in the anterior wall with associated anterior hypokinesis.  Suspect LAD territory.   NM MYOVIEW LTD  10/23/2019   Normal EF 55 to 60%.  No EKG changes.  Small size mild severity fixed defect in the apical wall consistent with breast attenuation versus small prior infarct.  Marked improvement from anterior apical perfusion abnormality seen in February 2020.   TRANSTHORACIC ECHOCARDIOGRAM  12/08/2020   Normal LV Fxn - EF 60-65%. No RWMA. Mod Asymmetric Basal Septa LVH with mild Concentric LVH. Gr II DD. Normal Longitudinal Strain. Normal RV size & fxn - normal RAP/CVP.  Normal MV. Mild AoV sclerosis w/ stenosis. Mild Ascending Ao dilation - ~38 mm (borderline).  => No change from 04/2017.   TUBAL LIGATION Bilateral    UPPER GASTROINTESTINAL ENDOSCOPY     VAGINAL HYSTERECTOMY  2003   with LSO and right  salpingectomy; right ovary still present   [facility] MONITOR-14-day  01/2021   Predominant rhythm sinus-heart rate range 40 to 124 bpm.  Average 70 bpm.  Rare PVCs and isolated PACs noted.  PAC couplets and triplets also noted as well as trigeminy.  3 atrial runs of 4-5 beats ranging from 102 to 150 bpm.  (2 of 3 noted on patient trigger.  Remainder patient triggers were sinus rhythm with PACs or PVCs); no atrial fibrillation  or other sustained arrhythmias noted.   Social History   Occupational History   Occupation: Retired Best boy: [employer]    Comment: Logistics, warehouse  Tobacco Use   Smoking status: Former    Packs/day: 0.10    Years: 30.00    Total pack years: 3.00    Types: Cigarettes    Quit date: 04/26/2012    Years since quitting: 10.0   Smokeless tobacco: Never  Vaping Use   Vaping Use: Never used  Substance and Sexual Activity   Alcohol use: Yes    Comment: occasional   Drug use: Yes    Types: Marijuana    Comment: weed juice   Sexual activity: Yes    Partners: Male    Birth control/protection: Surgical    Comment: hysterectomy

## 2022-05-20 NOTE — Procedures (Signed)
Lumbosacral Transforaminal Epidural Steroid Injection - Sub-Pedicular Approach with Fluoroscopic Guidance  Patient: Carrie Reynolds      Date of Birth: 1955-11-11 MRN: [id number] PCP: Leonard Downing, MD      Visit Date: 05/10/2022   Universal Protocol:    Date/Time: 05/10/2022  Consent Given By: the patient  Position: PRONE  Additional Comments: Vital signs were monitored before and after the procedure. Patient was prepped and draped in the usual sterile fashion. The correct patient, procedure, and site was verified.   Injection Procedure Details:   Procedure diagnoses: Lumbar radiculopathy [M54.16]    Meds Administered:  Meds ordered this encounter  Medications   methylPREDNISolone acetate (DEPO-MEDROL) injection 40 mg    Laterality: Left  Location/Site: L4  Needle:5.0 in., 22 ga.  Short bevel or Quincke spinal needle  Needle Placement: Transforaminal  Findings:    -Comments: Excellent flow of contrast along the nerve, nerve root and into the epidural space.  Procedure Details: After squaring off the end-plates to get a true AP view, the C-arm was positioned so that an oblique view of the foramen as noted above was visualized. The target area is just inferior to the "nose of the scotty dog" or sub pedicular. The soft tissues overlying this structure were infiltrated with 2-3 ml. of 1% Lidocaine without Epinephrine.  The spinal needle was inserted toward the target using a "trajectory" view along the fluoroscope beam.  Under AP and lateral visualization, the needle was advanced so it did not puncture dura and was located close the 6 O'Clock position of the pedical in AP tracterory. Biplanar projections were used to confirm position. Aspiration was confirmed to be negative for CSF and/or blood. A 1-2 ml. volume of Isovue-250 was injected and flow of contrast was noted at each level. Radiographs were obtained for documentation purposes.   After attaining the  desired flow of contrast documented above, a 0.5 to 1.0 ml test dose of 0.25% Marcaine was injected into each respective transforaminal space.  The patient was observed for 90 seconds post injection.  After no sensory deficits were reported, and normal lower extremity motor function was noted,   the above injectate was administered so that equal amounts of the injectate were placed at each foramen (level) into the transforaminal epidural space.   Additional Comments:  The patient tolerated the procedure well Dressing: 2 x 2 sterile gauze and Band-Aid    Post-procedure details: Patient was observed during the procedure. Post-procedure instructions were reviewed.  Patient left the clinic in stable condition.

## 2022-05-22 ENCOUNTER — Encounter: Payer: Self-pay | Admitting: Gastroenterology

## 2022-05-28 ENCOUNTER — Ambulatory Visit: Payer: Medicare Other | Admitting: Physician Assistant

## 2022-06-05 ENCOUNTER — Encounter: Payer: Medicare Other | Admitting: Gastroenterology

## 2022-06-11 ENCOUNTER — Other Ambulatory Visit: Payer: Self-pay | Admitting: Gastroenterology

## 2022-06-13 ENCOUNTER — Emergency Department (HOSPITAL_COMMUNITY): Payer: Medicare Other

## 2022-06-13 ENCOUNTER — Encounter: Payer: Self-pay | Admitting: Surgical

## 2022-06-13 ENCOUNTER — Emergency Department (HOSPITAL_COMMUNITY)
Admission: EM | Admit: 2022-06-13 | Discharge: 2022-06-14 | Disposition: A | Discharge status: Home / Self Care | Payer: Medicare Other | Attending: Emergency Medicine | Admitting: Emergency Medicine

## 2022-06-13 ENCOUNTER — Other Ambulatory Visit: Payer: Self-pay

## 2022-06-13 ENCOUNTER — Encounter (HOSPITAL_COMMUNITY): Payer: Self-pay

## 2022-06-13 DIAGNOSIS — W108XXA Fall (on) (from) other stairs and steps, initial encounter: Secondary | ICD-10-CM | POA: Insufficient documentation

## 2022-06-13 DIAGNOSIS — S8991XA Unspecified injury of right lower leg, initial encounter: Secondary | ICD-10-CM | POA: Diagnosis present

## 2022-06-13 DIAGNOSIS — N189 Chronic kidney disease, unspecified: Secondary | ICD-10-CM | POA: Diagnosis not present

## 2022-06-13 DIAGNOSIS — S8391XA Sprain of unspecified site of right knee, initial encounter: Secondary | ICD-10-CM | POA: Insufficient documentation

## 2022-06-13 DIAGNOSIS — I129 Hypertensive chronic kidney disease with stage 1 through stage 4 chronic kidney disease, or unspecified chronic kidney disease: Secondary | ICD-10-CM | POA: Insufficient documentation

## 2022-06-13 DIAGNOSIS — Z79899 Other long term (current) drug therapy: Secondary | ICD-10-CM | POA: Insufficient documentation

## 2022-06-13 DIAGNOSIS — Z7902 Long term (current) use of antithrombotics/antiplatelets: Secondary | ICD-10-CM | POA: Diagnosis not present

## 2022-06-13 DIAGNOSIS — I2510 Atherosclerotic heart disease of native coronary artery without angina pectoris: Secondary | ICD-10-CM | POA: Insufficient documentation

## 2022-06-13 MED ORDER — OXYCODONE-ACETAMINOPHEN 5-325 MG PO TABS
1.0000 | ORAL_TABLET | Freq: Once | ORAL | Status: AC
  Administered 2022-06-13: 1 via ORAL
  Filled 2022-06-13: qty 1

## 2022-06-13 NOTE — ED Triage Notes (Signed)
Pt reports she fell down about 4 carpeted steps; states she missed a step and fell onto her right knee amd ios reporting pain and swelling to her right knee. Denies head injury. She takes plavix. Denies LOC.

## 2022-06-13 NOTE — ED Provider Triage Note (Signed)
Emergency Medicine Provider Triage Evaluation Note  Carrie Reynolds , a 66 y.o. female  was evaluated in triage.  Pt complains of right knee pain after mechanical, non-syncopal fall down stairs this evening. Reports difficult to walk or bear weight. Reports she takes plavix. Did not strike head, did not lose consciousness.  Review of Systems  Positive: Knee pain Negative: Head injury, LOC  Physical Exam  LMP 07/24/2003 (Within Months)  Gen:   Awake, no distress   Resp:  Normal effort  MSK:   Moves extremities without difficulty  Other:  TTP medial and lateral right knee with minimal effusion, no obvious deformity, questionable varus laxity on exam but somewhat limited 2/2 pain, no other ligamentous laxity noted  Medical Decision Making  Medically screening exam initiated at 11:11 PM.  Appropriate orders placed.  Carrie Reynolds was informed that the remainder of the evaluation will be completed by another provider, this initial triage assessment does not replace that evaluation, and the importance of remaining in the ED until their evaluation is complete.  Workup initiated   Carrie Reynolds, [place] 06/13/22 2313

## 2022-06-13 NOTE — ED Notes (Signed)
Patient transported to X-ray 

## 2022-06-13 NOTE — Progress Notes (Signed)
Patient called office on- call number. Hyper extended her right knee while navigating stairs. Was able to weight bear until about 2 hours after the injury. Now she is wincing with pain and can't put weight on the leg. No groin pain, radicular pain, didn't fall onto the leg. She was advised to report to the ER for further evaluation and agreed with this plan.

## 2022-06-14 DIAGNOSIS — S8391XA Sprain of unspecified site of right knee, initial encounter: Secondary | ICD-10-CM | POA: Diagnosis not present

## 2022-06-14 MED ORDER — OXYCODONE-ACETAMINOPHEN 5-325 MG PO TABS
1.0000 | ORAL_TABLET | Freq: Once | ORAL | Status: AC
  Administered 2022-06-14: 1 via ORAL
  Filled 2022-06-14: qty 1

## 2022-06-14 NOTE — Progress Notes (Signed)
Orthopedic Tech Progress Note Patient Details:  Carrie Reynolds [birth year] [id number]  Ortho Devices Type of Ortho Device: Crutches, Knee Immobilizer Ortho Device/Splint Location: RLE Ortho Device/Splint Interventions: Ordered, Application, Adjustment   Post Interventions Patient Tolerated: Well Instructions Provided: Adjustment of device, Care of device, Poper ambulation with device  Carrie Reynolds 06/14/2022, 2:39 AM

## 2022-06-14 NOTE — ED Provider Notes (Signed)
[facility] EMERGENCY DEPARTMENT Provider Note   CSN: [id number] Arrival date & time: 06/13/22  2238     History  Chief Complaint  Patient presents with   Carrie Reynolds is a 66 y.o. female.  HPI Patient with history of CAD presents with knee pain.  Patient reports she missed a step and fell forward hyperflexing her right knee.  This all occurred on carpeted steps.  No head injury or LOC.  She reports all the pain is in her right knee with swelling.  She reports it hurts to walk   Past Medical History:  Diagnosis Date   Anxiety    Arnold-Chiari malformation ([place]) 2006   CAD S/P PCI 05/06/2019   05/06/2019: prox LAD (@ SPI) PCI with Resolute Onyx DES 3.5  x 8, Residual 60% PDA, Normal LVF; 07/02/2019: relook Cath for Anterior Ischemia on Mhoview --> CATH 70% pre-stent/proximal edge dissection --> proximal overlapping DES (Resolute Onyx 3.5 x 8 - new & old stent post-dilated to 3.8 mm)   Cervical strain    syndrome   Cervicogenic headache    Chronic kidney disease    Concussion with loss of consciousness 10/09/2017   CTS (carpal tunnel syndrome)    Depression    GERD (gastroesophageal reflux disease)    Glucose intolerance (impaired glucose tolerance)    High coronary artery calcium score    Score of 1460.  Coronary CTA shows high-grade proximal-mid LAD stenosis (CT FFR 0.50).  Also PDA/PLA 60 to 70% stenosis (CTO FFR 0.75)   History of kidney stones    Hypertension    Migraine headache    with visual aura   Obesity    OSA (obstructive sleep apnea)    not treated   Postmenopausal    Sleep apnea    Vertigo    post concussive    Home Medications Prior to Admission medications   Medication Sig Start Date End Date Taking? Authorizing Provider  acetaminophen (TYLENOL) 500 MG tablet Take 1,000 mg by mouth every 6 (six) hours as needed for mild pain, moderate pain or headache.    [provider]  AMBULATORY NON FORMULARY MEDICATION  Medication Name: GI Cocktail 90 ml Viscous Lidocaine, 90 ML Dicyclomine 10 mg/23m, 270 ML Maalox, Total of 450 ML. 5cc or 10 cc every 4 to 6 hours as needed for Epigastric pain. Patient not taking: Reported on 05/15/2022 03/29/22   LLevin Erp PA  cetirizine (ZYRTEC) 10 MG tablet Take 10 mg by mouth daily as needed for allergies. Patient not taking: Reported on 05/15/2022 10/04/21   [provider]  clopidogrel (PLAVIX) 75 MG tablet TAKE 1 TABLET BY MOUTH EVERY DAY Patient taking differently: Take 75 mg by mouth daily. 11/29/20   HLeonie Man MD  diclofenac Sodium (VOLTAREN) 1 % GEL Apply 2 g topically daily as needed (Pain). Patient not taking: Reported on 05/15/2022 10/04/21   [provider]  famotidine (PEPCID) 40 MG tablet Take 1 tablet (40 mg total) by mouth 2 (two) times daily. 03/23/22   LLevin Erp PA  ferrous sulfate 325 (65 FE) MG tablet Take 1 tablet (325 mg total) by mouth daily with breakfast. 09/07/21 05/15/22  Mansouraty, GTelford Nab, MD  hydrochlorothiazide (HYDRODIURIL) 12.5 MG tablet Take 12.5 mg by mouth daily. 03/11/22   [provider]  isosorbide mononitrate (IMDUR) 30 MG 24 hr tablet Take 1 tablet (30 mg total) by mouth daily. Patient not taking: Reported on 05/15/2022  03/20/22 03/20/23  [name], PA  losartan-hydrochlorothiazide (HYZAAR) 100-12.5 MG tablet Take 0.5 tablets by mouth daily. Patient taking differently: Take 1 tablet by mouth daily. 06/16/19   Leonie Man, MD  nitroGLYCERIN (NITROSTAT) 0.4 MG SL tablet Place 1 tablet (0.4 mg total) under the tongue every 5 (five) minutes as needed. 03/20/22   [name], PA  omeprazole (PRILOSEC) 40 MG capsule Take 1 capsule by mouth twice daily. May open capsule and place in applesauce or yogurt. 05/04/22   Esterwood, Amy S, PA-C  propranolol (INDERAL) 10 MG tablet May take  1 to 2  tablets a day  as needed for palpiptation Patient taking differently: Take 10 mg by mouth every  8 (eight) hours as needed (panic attack). 08/21/21   Warren Lacy, PA-C  rosuvastatin (CRESTOR) 40 MG tablet Take 40 mg by mouth daily. 06/27/20   [provider]  sertraline (ZOLOFT) 100 MG tablet Take 100-200 mg by mouth daily as needed (Mood).    [provider]  sucralfate (CARAFATE) 1 g tablet TAKE 1 TABLET (1 G TOTAL) BY MOUTH 4 TIMES A DAY WITH MEALS AND AT BEDTIME 06/11/22   Mansouraty, Telford Nab., MD      Allergies    Ceclor [cefaclor], Tetracyclines & related, Aspirin, Lipitor [atorvastatin calcium], Penicillins, Amoxicillin, Ampicillin, Lisinopril, and Simvastatin    Review of Systems   Review of Systems  Musculoskeletal:  Positive for arthralgias and joint swelling.    Physical Exam Updated Vital Signs BP 113/61   Pulse (!) 57   Temp 98.8 F (37.1 C) (Oral)   Resp 18   Ht 1.702 m ('5\' 7"'$ )   Wt 92.5 kg   LMP 07/24/2003 (Within Months)   SpO2 97%   BMI 31.95 kg/m  Physical Exam CONSTITUTIONAL: Well developed/well nourished HEAD: Normocephalic/atraumatic EYES: EOMI NEURO: Pt is awake/alert/appropriate, moves all extremitiesx4.  No facial droop.   EXTREMITIES: pulses normal/equal, full ROM Diffuse tenderness noted to right knee.  No deformity.  Joint effusion is noted.  No erythema. Distal pulses intact. All other extremities/joints palpated/ranged and nontender SKIN: warm, color normal PSYCH: no abnormalities of mood noted, alert and oriented to situation  ED Results / Procedures / Treatments   Labs (all labs ordered are listed, but only abnormal results are displayed) Labs Reviewed - No data to display  EKG None  Radiology DG Knee Complete 4 Views Right  Result Date: 06/13/2022 CLINICAL DATA:  Fall, knee pain EXAM: RIGHT KNEE - COMPLETE 4+ VIEW COMPARISON:  None Available. FINDINGS: No evidence of fracture, dislocation, or joint effusion. No evidence of arthropathy or other focal bone abnormality. Soft tissues are unremarkable.  IMPRESSION: Negative. Electronically Signed   By: Ronney Asters M.D.   On: 06/13/2022 23:36    Procedures Procedures    Medications Ordered in ED Medications  oxyCODONE-acetaminophen (PERCOCET/ROXICET) 5-325 MG per tablet 1 tablet (1 tablet Oral Given 06/13/22 2314)  oxyCODONE-acetaminophen (PERCOCET/ROXICET) 5-325 MG per tablet 1 tablet (1 tablet Oral Given 06/14/22 0215)    ED Course/ Medical Decision Making/ A&P                           Medical Decision Making Risk Prescription drug management.   Patient presents after mechanical fall injuring her right knee.  She reports she hyperflexed it.  She has tenderness and evidence of an effusion.  She request knee immobilizer.  She was given crutches and nursing staff ensured she was able  to safely use them.  She is followed by Dr. Alphonzo Severance.  She is safe for discharge        Final Clinical Impression(s) / ED Diagnoses Final diagnoses:  Sprain of right knee, unspecified ligament, initial encounter    Rx / DC Orders ED Discharge Orders     None         Ripley Fraise, MD 06/14/22 [phone]

## 2022-06-15 ENCOUNTER — Encounter: Payer: Self-pay | Admitting: Orthopedic Surgery

## 2022-06-18 ENCOUNTER — Encounter: Payer: Self-pay | Admitting: Orthopedic Surgery

## 2022-06-18 ENCOUNTER — Ambulatory Visit (INDEPENDENT_AMBULATORY_CARE_PROVIDER_SITE_OTHER): Payer: Medicare Other | Admitting: Orthopedic Surgery

## 2022-06-18 DIAGNOSIS — M25561 Pain in right knee: Secondary | ICD-10-CM | POA: Diagnosis not present

## 2022-06-18 DIAGNOSIS — I2089 Other forms of angina pectoris: Secondary | ICD-10-CM | POA: Diagnosis not present

## 2022-06-18 NOTE — Telephone Encounter (Signed)
She should come in for eval before we can order MRI.  We can work her in this afternoon and then get the ball rolling if it looks like MRI is necessary which is likely given her negative xrays and inability to weightbear several days ago

## 2022-06-19 ENCOUNTER — Encounter: Payer: Self-pay | Admitting: Orthopedic Surgery

## 2022-06-19 NOTE — Progress Notes (Unsigned)
Office Visit Note   Patient: Carrie Reynolds           Date of Birth: Jul 10, 1956           MRN: [id number] Visit Date: 06/18/2022 Requested by: Leonard Downing, MD [address] [place],   [address] PCP: Leonard Downing, MD  Subjective: Chief Complaint  Patient presents with   Right Knee - Injury    DOI 06/13/2022    HPI: Carrie Reynolds is a 66 y.o. female who presents to the office reporting right knee pain.  She fell down the steps and had a hyperflexion injury to the knee on 06/13/2022.  Reports primarily posterior medial pain.  Did describe swelling and effusion at the time.  Radiographs in the emergency department were negative for fracture or significant arthritis.  She is using a cane.  Tylenol helps.  She does describe new mechanical symptoms..                ROS: All systems reviewed are negative as they relate to the chief complaint within the history of present illness.  Patient denies fevers or chills.  Assessment & Plan: Visit Diagnoses:  1. Acute pain of right knee     Plan: Impression is diminished functional ambulatory ability following hyperflexion injury to the right knee with effusion and high likelihood of medial meniscal pathology based on exam.  Symptoms not improving and the patient does have diminished weightbearing ability.  Patient continues to work.  Plan at this time is MRI scan right knee to evaluate possible medial meniscal tear.  Hold off on injection for now based on high likelihood of medial meniscal pathology.  Follow-up after that scan.  Follow-Up Instructions: No follow-ups on file.   Orders:  Orders Placed This Encounter  Procedures   MR Knee Right w/o contrast   No orders of the defined types were placed in this encounter.     Procedures: No procedures performed   Clinical Data: No additional findings.  Objective: Vital Signs: LMP 07/24/2003 (Within Months)   Physical Exam:  Constitutional: Patient  appears well-developed HEENT:  Head: Normocephalic Eyes:EOM are normal Neck: Normal range of motion Cardiovascular: Normal rate Pulmonary/chest: Effort normal Neurologic: Patient is alert Skin: Skin is warm Psychiatric: Patient has normal mood and affect  Ortho Exam: Ortho exam demonstrates mild effusion in the right knee with medial joint line tenderness positive McMurray compression testing for medial compartment pathology as well as stable collateral and cruciate ligaments.  Antalgic gait to the right.  No groin pain on the right with internal/external rotation of the leg.  Extensor mechanism intact.  Range of motion is full but definitely has pain with flexion past 90.  Specialty Comments:  MRI LUMBAR SPINE FINDINGS   Segmentation: Assumed standard. The last well-formed disc space is designated L5-S1 for the purposes of this report.   Alignment:  Physiologic.   Vertebrae: No fracture, evidence of discitis, or bone lesion. Small hemangioma in the L1 vertebral body.   Conus medullaris and cauda equina: Conus extends to the L1 level. Conus and cauda equina appear normal.   Paraspinal and other soft tissues: Negative.   Disc levels:   T12-L1:  Negative.   L1-L2:  Negative.   L2-L3:  Negative.   L3-L4: Small disc bulge, asymmetric to the left, with annular fissure. Moderate right greater than left facet arthropathy with small right facet joint effusion. No stenosis.   L4-L5: Small disc bulge and annular fissure.  Moderate bilateral facet arthropathy with ligamentum flavum hypertrophy. Small right synovial cyst projecting into the paraspinous soft tissues. Mild bilateral neuroforaminal stenosis. No spinal canal stenosis.   L5-S1:  Mild to moderate bilateral facet arthropathy.  No stenosis.   IMPRESSION: 1. No acute traumatic injury within the cervical, thoracic, or lumbar spine. 2. Mild degenerative disc disease at C4-C5 and C5-C6. 3. Mild degenerative disc disease  and moderate bilateral facet arthropathy at L3-L4 and L4-L5. Mild bilateral neuroforaminal stenosis at L4-L5. 4. 2.3 cm indeterminate right adrenal gland nodule. Recommend nonemergent adrenal protocol CT with and without contrast for further evaluation. This recommendation follows ACR consensus guidelines: Management of Incidental Adrenal Masses: A White Paper of the ACR Incidental Findings Committee. J Am Coll Radiol 2017;14:1038-1044.     Electronically Signed   By: Titus Dubin M.D.   On: 10/17/2017 16:56  Imaging: No results found.   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   Ulcer of small intestine 09/06/2021   Acute blood loss anemia 09/06/2021   Iron deficiency 09/06/2021   Colon cancer screening 09/06/2021   Long term (current) use of antithrombotics/antiplatelets 09/06/2021   Jejunal ulcer 08/21/2021   Syncope 07/15/2021   Sinus bradycardia 11/03/2019   Atypical angina 10/12/2019   Fatigue 10/12/2019   Presence of 2 overlapping Drug Coated Stents in LAD coronary artery    Coronary artery disease involving native coronary artery of native heart with angina pectoris ([place]) 06/17/2019   DOE (dyspnea on exertion) 06/16/2019   CAD S/P DES PCI-proximal LAD 05/20/2019   Agatston coronary artery calcium score greater than 400 04/16/2019   Migraine with vertigo 07/29/2018   Rapid palpitations 24/26/8341   Systolic murmur 96/22/2979   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   OSA on CPAP 11/19/2013   Hyperlipidemia with target LDL less than 70 07/29/2013   Obesity (BMI 30.0-34.9) 04/25/2012   Depression with anxiety 04/25/2012   Environmental allergies 04/25/2012   Essential (primary) hypertension 04/25/2012   Chiari malformation type I ([place]) 01/03/2012   Postmenopausal 09/18/2011   Past Medical History:  Diagnosis Date   Anxiety    Arnold-Chiari malformation ([place]) 2006   CAD  S/P PCI 05/06/2019   05/06/2019: prox LAD (@ SPI) PCI with Resolute Onyx DES 3.5  x 8, Residual 60% PDA, Normal LVF; 07/02/2019: relook Cath for Anterior Ischemia on Mhoview --> CATH 70% pre-stent/proximal edge dissection --> proximal overlapping DES (Resolute Onyx 3.5 x 8 - new & old stent post-dilated to 3.8 mm)   Cervical strain    syndrome   Cervicogenic headache    Chronic kidney disease    Concussion with loss of consciousness 10/09/2017   CTS (carpal tunnel syndrome)    Depression    GERD (gastroesophageal reflux disease)    Glucose intolerance (impaired glucose tolerance)    High coronary artery calcium score    Score of 1460.  Coronary CTA shows high-grade proximal-mid LAD stenosis (CT FFR 0.50).  Also PDA/PLA 60 to 70% stenosis (CTO FFR 0.75)   History of kidney stones    Hypertension    Migraine headache    with visual aura   Obesity    OSA (obstructive sleep apnea)    not treated   Postmenopausal    Sleep apnea    Vertigo    post concussive    Family History  Problem Relation Age of Onset   Hypertension Mother    Lung cancer  Father    Alzheimer's disease Father    Migraines Father    Hyperlipidemia Sister    Hypertension Sister    Colon cancer Maternal Grandmother    Heart failure Maternal Grandfather    Alzheimer's disease Paternal Grandmother    Lung cancer Paternal Grandfather    Esophageal cancer Neg Hx    Inflammatory bowel disease Neg Hx    Liver disease Neg Hx    Pancreatic cancer Neg Hx    Rectal cancer Neg Hx    Stomach cancer Neg Hx     Past Surgical History:  Procedure Laterality Date   BIOPSY  07/15/2021   Procedure: BIOPSY;  Surgeon: Mansouraty, Telford Nab., MD;  Location: [place];  Service: Gastroenterology;;   BUNIONECTOMY     right great toe   CHOLECYSTECTOMY N/A 06/05/2018   Procedure: LAPAROSCOPIC CHOLECYSTECTOMY WITH POSSIBLE INTRAOPERATIVE CHOLANGIOGRAM;  Surgeon: Erroll Luna, MD;  Location: [place];  Service: General;   Laterality: N/A;   COLOSTOMY     CORONARY STENT INTERVENTION N/A 05/12/2019   Procedure: CORONARY STENT INTERVENTION;  Surgeon: Leonie Man, MD;  Location: [place] [facility];  Service: Cardiovascular;;; Culprit-p-mLAD 95% 950% SP1) --> Scoring Balloon PTCA -> DES PCI (Resolute DES 3.5 x 38 - 3.8 mm; 0% LAD& 55% SP1). -->  SP1 was somewhat jailed with TIMI II flow post PCI.   CORONARY STENT INTERVENTION N/A 07/02/2019   Procedure: CORONARY STENT INTERVENTION;  Surgeon: Leonie Man, MD;  Location: [place] [facility];  Service: Cardiovascular;; * 70% Proximal stent edge dissection (edge of previous stent that is patent) -->  successfully covered with additional RESOLUTE ONYX DES 3.5 mm x 8 mm overlapping proximal edge of previous stent (postdilated to 3.8 mm)   ENTEROSCOPY N/A 07/15/2021   Procedure: ENTEROSCOPY;  Surgeon: Rush Landmark Telford Nab., MD;  Location: [place];  Service: Gastroenterology;  Laterality: N/A;   GASTRIC BYPASS  11/2015   [facility]   GYNECOLOGIC CRYOSURGERY     HAND SURGERY     LEFT HEART CATH AND CORONARY ANGIOGRAPHY N/A 05/12/2019   Procedure: LEFT HEART CATH AND CORONARY ANGIOGRAPHY;  Surgeon: Leonie Man, MD;  Location: [place] [facility];  Service: Cardiovascular;;; Culprit-p-mLAD 95% 950% SP1) --> DES PCI.;  RPAV 60% -small caliber vessel, did not appear flow-limiting (medical management).  EF 55 to 60%.  Normal LVEDP.   LEFT HEART CATH AND CORONARY ANGIOGRAPHY N/A 07/02/2019   Procedure: LEFT HEART CATH AND CORONARY ANGIOGRAPHY;  Surgeon: Leonie Man, MD;  Location: [place] [facility];  Service: Cardiovascular;; CULPRIT = ~70% proximal edge dissection of previous stent (DES PCI overlapping stent placed) - jails SP1 w/ ~60-70% ostial stenosis.[name] mLAD 25%. RI 30%, RPAV 60%.   LEFT HEART CATH AND CORONARY ANGIOGRAPHY N/A 03/30/2022   Procedure: LEFT HEART CATH AND CORONARY ANGIOGRAPHY;  Surgeon: Early Osmond, MD;  Location: [place] [facility];  Service:  Cardiovascular;  Laterality: N/A;   NM MYOVIEW LTD  06/24/2019   Carlton Adam): HIGH RISK.  EF 55%.  Large size, moderate severe defect in the anterior wall with associated anterior hypokinesis.  Suspect LAD territory.   NM MYOVIEW LTD  10/23/2019   Normal EF 55 to 60%.  No EKG changes.  Small size mild severity fixed defect in the apical wall consistent with breast attenuation versus small prior infarct.  Marked improvement from anterior apical perfusion abnormality seen in February 2020.   TRANSTHORACIC ECHOCARDIOGRAM  12/08/2020   Normal LV Fxn - EF  60-65%. No RWMA. Mod Asymmetric Basal Septa LVH with mild Concentric LVH. Gr II DD. Normal Longitudinal Strain. Normal RV size & fxn - normal RAP/CVP.  Normal MV. Mild AoV sclerosis w/ stenosis. Mild Ascending Ao dilation - ~38 mm (borderline).  => No change from 04/2017.   TUBAL LIGATION Bilateral    UPPER GASTROINTESTINAL ENDOSCOPY     VAGINAL HYSTERECTOMY  2003   with LSO and right salpingectomy; right ovary still present   [facility] MONITOR-14-day  01/2021   Predominant rhythm sinus-heart rate range 40 to 124 bpm.  Average 70 bpm.  Rare PVCs and isolated PACs noted.  PAC couplets and triplets also noted as well as trigeminy.  3 atrial runs of 4-5 beats ranging from 102 to 150 bpm.  (2 of 3 noted on patient trigger.  Remainder patient triggers were sinus rhythm with PACs or PVCs); no atrial fibrillation or other sustained arrhythmias noted.   Social History   Occupational History   Occupation: Retired Best boy: [employer]    Comment: Logistics, warehouse  Tobacco Use   Smoking status: Former    Packs/day: 0.10    Years: 30.00    Total pack years: 3.00    Types: Cigarettes    Quit date: 04/26/2012    Years since quitting: 10.1   Smokeless tobacco: Never  Vaping Use   Vaping Use: Never used  Substance and Sexual Activity   Alcohol use: Yes    Comment: occasional   Drug use: Yes    Types: Marijuana    Comment:  weed juice   Sexual activity: Yes    Partners: Male    Birth control/protection: Surgical    Comment: hysterectomy

## 2022-06-25 ENCOUNTER — Ambulatory Visit
Admission: RE | Admit: 2022-06-25 | Discharge: 2022-06-25 | Disposition: A | Discharge status: Home / Self Care | Payer: Medicare Other | Source: Ambulatory Visit | Attending: Orthopedic Surgery | Admitting: Orthopedic Surgery

## 2022-06-25 DIAGNOSIS — M25561 Pain in right knee: Secondary | ICD-10-CM

## 2022-06-26 NOTE — Progress Notes (Signed)
Can u make f/u thx

## 2022-06-28 ENCOUNTER — Ambulatory Visit (INDEPENDENT_AMBULATORY_CARE_PROVIDER_SITE_OTHER): Payer: Medicare Other | Admitting: Surgical

## 2022-06-28 DIAGNOSIS — M1711 Unilateral primary osteoarthritis, right knee: Secondary | ICD-10-CM

## 2022-06-28 DIAGNOSIS — I2089 Other forms of angina pectoris: Secondary | ICD-10-CM

## 2022-07-01 ENCOUNTER — Encounter: Payer: Self-pay | Admitting: Orthopedic Surgery

## 2022-07-01 NOTE — Progress Notes (Signed)
Office Visit Note   Patient: Carrie Reynolds           Date of Birth: 1956-04-29           MRN: [id number] Visit Date: 06/28/2022 Requested by: Leonard Downing, MD [address] [place],  [place] [address] PCP: Leonard Downing, MD  Subjective: Chief Complaint  Patient presents with   Other     Scan review    HPI: Carrie Reynolds is a 66 y.o. female who presents to the office for MRI review. Continues to complain mainly of medial right knee pain.  Not really having much in the way of lateral pain or posterior pain.  No significant mechanical symptoms.  She is not walking with a cane or walker and ambulation has improved.  She feels she is making consistent progress as she gets further out from her injury.  No new injuries.  No groin pain.  No radicular pain.  MRI results revealed: MR Knee Right w/o contrast  Result Date: 06/26/2022 CLINICAL DATA:  Knee pain, status post fall 2 weeks ago EXAM: MRI OF THE RIGHT KNEE WITHOUT CONTRAST TECHNIQUE: Multiplanar, multisequence MR imaging of the knee was performed. No intravenous contrast was administered. COMPARISON:  None Available. FINDINGS: MENISCI Medial: Degeneration of the posterior horn of the medial meniscus with irregularity along the inferior surface towards the free edge with a probable small undersurface tear (image 10/series 7). Lateral: Small complex tear of the free edge of the posterior horn-body junction of the lateral meniscus. Partial radial tear at the root of the posterior horn of the lateral meniscus. LIGAMENTS Cruciates: ACL and PCL are intact. Collaterals: Medial collateral ligament is intact. Lateral collateral ligament complex is intact. CARTILAGE Patellofemoral: High-grade partial-thickness cartilage loss with areas of full-thickness cartilage loss of the lateral patellar facet. Partial-thickness cartilage loss of the lateral trochlea. Partial-thickness cartilage loss of the medial patellofemoral  compartment. Medial: Partial-thickness cartilage loss of the medial femorotibial compartment. Lateral: High-grade partial-thickness cartilage loss of the lateral femorotibial compartment with areas of full-thickness cartilage loss. JOINT: Small joint effusion. Normal Hoffa's fat-pad. No plical thickening. POPLITEAL FOSSA: Popliteus tendon is intact. Moderate-sized Baker's cyst measuring 4.5 x 1.6 x 4.7 cm. EXTENSOR MECHANISM: Intact quadriceps tendon. Intact patellar tendon. Intact lateral patellar retinaculum. Intact medial patellar retinaculum. Intact MPFL. BONES: No aggressive osseous lesion. No fracture or dislocation. Bone marrow edema in the posterolateral tibial plateau concerning for an osseous contusion. Other: No fluid collection or hematoma. Muscles are normal. IMPRESSION: 1. Small complex tear of the free edge of the posterior horn-body junction of the lateral meniscus. Partial radial tear at the root of the posterior horn of the lateral meniscus. 2. Degeneration of the posterior horn of the medial meniscus with irregularity along the inferior surface towards the free edge with a probable small undersurface tear (image 10/series 7). 3. Tricompartmental cartilage abnormalities as described above. 4. Bone marrow edema in the posterolateral tibial plateau concerning for an osseous contusion. 5. Moderate-sized Baker's cyst. Electronically Signed   By: Kathreen Devoid M.D.   On: 06/26/2022 08:51                 ROS: All systems reviewed are negative as they relate to the chief complaint within the history of present illness.  Patient denies fevers or chills.  Assessment & Plan: Visit Diagnoses:  1. Unilateral primary osteoarthritis, right knee     Plan: Carrie Reynolds is a 66 y.o. female who presents to  the office for review of right knee MRI scan.  MRI demonstrates complex tear of the posterior aspect of the lateral meniscus with partial radial tear at the root of the lateral meniscus.  Already  has high-grade partial-thickness cartilage damage to the lateral compartment.  There is also full-thickness cartilage loss of the lateral patellar facet and the patellofemoral compartment.  Vast majority of her pain is in the medial aspect of the knee.  She does have tenderness over the medial joint line as well as the pes anserine bursa.  Discussed the options available patient but she would like to hold off on any intervention for now and just focus on rehabbing this by herself and see if pain will continually improve as it has over the last several weeks.  She may follow-up with the office as needed and if she would like to try either an injection into the joint or evaluation of the pes anserine bursa with potential injection there, she may return when she feels it is necessary.  She also has Baker's cyst which we could consider aspirating but it is currently asymptomatic based on her exam today.  Follow-Up Instructions: No follow-ups on file.   Orders:  No orders of the defined types were placed in this encounter.  No orders of the defined types were placed in this encounter.     Procedures: No procedures performed   Clinical Data: No additional findings.  Objective: Vital Signs: LMP 07/24/2003 (Within Months)   Physical Exam:  Constitutional: Patient appears well-developed HEENT:  Head: Normocephalic Eyes:EOM are normal Neck: Normal range of motion Cardiovascular: Normal rate Pulmonary/chest: Effort normal Neurologic: Patient is alert Skin: Skin is warm Psychiatric: Patient has normal mood and affect  Ortho Exam: Ortho exam demonstrates right knee with 0 degrees extension and 110 degrees of knee flexion.  Tenderness over the medial joint line.  Really not much tenderness over the lateral joint line.  Able to perform straight leg raise without extensor lag.  No calf tenderness.  Negative Homans' sign.  No pain with hip range of motion.  Stable to anterior posterior drawer sign.   Does have Baker's cyst noted in the back of the knee.  This is not tender for her.  Tender over the pes anserine bursa moderately  Specialty Comments:  MRI LUMBAR SPINE FINDINGS   Segmentation: Assumed standard. The last well-formed disc space is designated L5-S1 for the purposes of this report.   Alignment:  Physiologic.   Vertebrae: No fracture, evidence of discitis, or bone lesion. Small hemangioma in the L1 vertebral body.   Conus medullaris and cauda equina: Conus extends to the L1 level. Conus and cauda equina appear normal.   Paraspinal and other soft tissues: Negative.   Disc levels:   T12-L1:  Negative.   L1-L2:  Negative.   L2-L3:  Negative.   L3-L4: Small disc bulge, asymmetric to the left, with annular fissure. Moderate right greater than left facet arthropathy with small right facet joint effusion. No stenosis.   L4-L5: Small disc bulge and annular fissure. Moderate bilateral facet arthropathy with ligamentum flavum hypertrophy. Small right synovial cyst projecting into the paraspinous soft tissues. Mild bilateral neuroforaminal stenosis. No spinal canal stenosis.   L5-S1:  Mild to moderate bilateral facet arthropathy.  No stenosis.   IMPRESSION: 1. No acute traumatic injury within the cervical, thoracic, or lumbar spine. 2. Mild degenerative disc disease at C4-C5 and C5-C6. 3. Mild degenerative disc disease and moderate bilateral facet arthropathy at L3-L4 and  L4-L5. Mild bilateral neuroforaminal stenosis at L4-L5. 4. 2.3 cm indeterminate right adrenal gland nodule. Recommend nonemergent adrenal protocol CT with and without contrast for further evaluation. This recommendation follows ACR consensus guidelines: Management of Incidental Adrenal Masses: A White Paper of the ACR Incidental Findings Committee. J Am Coll Radiol 2017;14:1038-1044.     Electronically Signed   By: Titus Dubin M.D.   On: 10/17/2017 16:56  Imaging: No results  found.   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   Ulcer of small intestine 09/06/2021   Acute blood loss anemia 09/06/2021   Iron deficiency 09/06/2021   Colon cancer screening 09/06/2021   Long term (current) use of antithrombotics/antiplatelets 09/06/2021   Jejunal ulcer 08/21/2021   Syncope 07/15/2021   Sinus bradycardia 11/03/2019   Atypical angina 10/12/2019   Fatigue 10/12/2019   Presence of 2 overlapping Drug Coated Stents in LAD coronary artery    Coronary artery disease involving native coronary artery of native heart with angina pectoris ([place]) 06/17/2019   DOE (dyspnea on exertion) 06/16/2019   CAD S/P DES PCI-proximal LAD 05/20/2019   Agatston coronary artery calcium score greater than 400 04/16/2019   Migraine with vertigo 07/29/2018   Rapid palpitations 67/20/9470   Systolic murmur 96/28/3662   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   OSA on CPAP 11/19/2013   Hyperlipidemia with target LDL less than 70 07/29/2013   Obesity (BMI 30.0-34.9) 04/25/2012   Depression with anxiety 04/25/2012   Environmental allergies 04/25/2012   Essential (primary) hypertension 04/25/2012   Chiari malformation type I ([place]) 01/03/2012   Postmenopausal 09/18/2011   Past Medical History:  Diagnosis Date   Anxiety    Arnold-Chiari malformation ([place]) 2006   CAD S/P PCI 05/06/2019   05/06/2019: prox LAD (@ SPI) PCI with Resolute Onyx DES 3.5  x 8, Residual 60% PDA, Normal LVF; 07/02/2019: relook Cath for Anterior Ischemia on [place] --> CATH 70% pre-stent/proximal edge dissection --> proximal overlapping DES (Resolute Onyx 3.5 x 8 - new & old stent post-dilated to 3.8 mm)   Cervical strain    syndrome   Cervicogenic headache    Chronic kidney disease    Concussion with loss of consciousness 10/09/2017   CTS (carpal tunnel syndrome)    Depression    GERD (gastroesophageal reflux disease)    Glucose  intolerance (impaired glucose tolerance)    High coronary artery calcium score    Score of 1460.  Coronary CTA shows high-grade proximal-mid LAD stenosis (CT FFR 0.50).  Also PDA/PLA 60 to 70% stenosis (CTO FFR 0.75)   History of kidney stones    Hypertension    Migraine headache    with visual aura   Obesity    OSA (obstructive sleep apnea)    not treated   Postmenopausal    Sleep apnea    Vertigo    post concussive    Family History  Problem Relation Age of Onset   Hypertension Mother    Lung cancer Father    Alzheimer's disease Father    Migraines Father    Hyperlipidemia Sister    Hypertension Sister    Colon cancer Maternal Grandmother    Heart failure Maternal Grandfather    Alzheimer's disease Paternal Grandmother    Lung cancer Paternal Grandfather    Esophageal cancer Neg Hx    Inflammatory bowel disease Neg Hx    Liver disease Neg Hx    Pancreatic  cancer Neg Hx    Rectal cancer Neg Hx    Stomach cancer Neg Hx     Past Surgical History:  Procedure Laterality Date   BIOPSY  07/15/2021   Procedure: BIOPSY;  Surgeon: Mansouraty, Telford Nab., MD;  Location: [facility];  Service: Gastroenterology;;   Lillard Anes     right great toe   CHOLECYSTECTOMY N/A 06/05/2018   Procedure: LAPAROSCOPIC CHOLECYSTECTOMY WITH POSSIBLE INTRAOPERATIVE CHOLANGIOGRAM;  Surgeon: Erroll Luna, MD;  Location: [facility];  Service: General;  Laterality: N/A;   COLOSTOMY     CORONARY STENT INTERVENTION N/A 05/12/2019   Procedure: CORONARY STENT INTERVENTION;  Surgeon: Leonie Man, MD;  Location: [facility];  Service: Cardiovascular;;; Culprit-p-mLAD 95% 950% SP1) --> Scoring Balloon PTCA -> DES PCI (Resolute DES 3.5 x 38 - 3.8 mm; 0% LAD& 55% SP1). -->  SP1 was somewhat jailed with TIMI II flow post PCI.   CORONARY STENT INTERVENTION N/A 07/02/2019   Procedure: CORONARY STENT INTERVENTION;  Surgeon: Leonie Man, MD;  Location: [facility];  Service: Cardiovascular;;  * 70% Proximal stent edge dissection (edge of previous stent that is patent) -->  successfully covered with additional RESOLUTE ONYX DES 3.5 mm x 8 mm overlapping proximal edge of previous stent (postdilated to 3.8 mm)   ENTEROSCOPY N/A 07/15/2021   Procedure: ENTEROSCOPY;  Surgeon: Rush Landmark Telford Nab., MD;  Location: [facility];  Service: Gastroenterology;  Laterality: N/A;   GASTRIC BYPASS  11/2015   [facility]   GYNECOLOGIC CRYOSURGERY     HAND SURGERY     LEFT HEART CATH AND CORONARY ANGIOGRAPHY N/A 05/12/2019   Procedure: LEFT HEART CATH AND CORONARY ANGIOGRAPHY;  Surgeon: Leonie Man, MD;  Location: [facility];  Service: Cardiovascular;;; Culprit-p-mLAD 95% 950% SP1) --> DES PCI.;  RPAV 60% -small caliber vessel, did not appear flow-limiting (medical management).  EF 55 to 60%.  Normal LVEDP.   LEFT HEART CATH AND CORONARY ANGIOGRAPHY N/A 07/02/2019   Procedure: LEFT HEART CATH AND CORONARY ANGIOGRAPHY;  Surgeon: Leonie Man, MD;  Location: [facility];  Service: Cardiovascular;; CULPRIT = ~70% proximal edge dissection of previous stent (DES PCI overlapping stent placed) - jails SP1 w/ ~60-70% ostial stenosis.[name] mLAD 25%. RI 30%, RPAV 60%.   LEFT HEART CATH AND CORONARY ANGIOGRAPHY N/A 03/30/2022   Procedure: LEFT HEART CATH AND CORONARY ANGIOGRAPHY;  Surgeon: Early Osmond, MD;  Location: [facility];  Service: Cardiovascular;  Laterality: N/A;   NM MYOVIEW LTD  06/24/2019   [facility] Adam): HIGH RISK.  EF 55%.  Large size, moderate severe defect in the anterior wall with associated anterior hypokinesis.  Suspect LAD territory.   NM MYOVIEW LTD  10/23/2019   Normal EF 55 to 60%.  No EKG changes.  Small size mild severity fixed defect in the apical wall consistent with breast attenuation versus small prior infarct.  Marked improvement from anterior apical perfusion abnormality seen in February 2020.   TRANSTHORACIC ECHOCARDIOGRAM  12/08/2020   Normal LV Fxn - EF  60-65%. No RWMA. Mod Asymmetric Basal Septa LVH with mild Concentric LVH. Gr II DD. Normal Longitudinal Strain. Normal RV size & fxn - normal RAP/CVP.  Normal MV. Mild AoV sclerosis w/ stenosis. Mild Ascending Ao dilation - ~38 mm (borderline).  => No change from 04/2017.   TUBAL LIGATION Bilateral    UPPER GASTROINTESTINAL ENDOSCOPY     VAGINAL HYSTERECTOMY  2003   with LSO and right salpingectomy; right ovary  still present   [facility] MONITOR-14-day  01/2021   Predominant rhythm sinus-heart rate range 40 to 124 bpm.  Average 70 bpm.  Rare PVCs and isolated PACs noted.  PAC couplets and triplets also noted as well as trigeminy.  3 atrial runs of 4-5 beats ranging from 102 to 150 bpm.  (2 of 3 noted on patient trigger.  Remainder patient triggers were sinus rhythm with PACs or PVCs); no atrial fibrillation or other sustained arrhythmias noted.   Social History   Occupational History   Occupation: Retired Best boy: [employer]    Comment: Logistics, warehouse  Tobacco Use   Smoking status: Former    Packs/day: 0.10    Years: 30.00    Total pack years: 3.00    Types: Cigarettes    Quit date: 04/26/2012    Years since quitting: 10.1   Smokeless tobacco: Never  Vaping Use   Vaping Use: Never used  Substance and Sexual Activity   Alcohol use: Yes    Comment: occasional   Drug use: Yes    Types: Marijuana    Comment: weed juice   Sexual activity: Yes    Partners: Male    Birth control/protection: Surgical    Comment: hysterectomy

## 2022-09-24 ENCOUNTER — Other Ambulatory Visit: Payer: Self-pay | Admitting: Gastroenterology

## 2022-12-06 ENCOUNTER — Encounter: Payer: Self-pay | Admitting: Gastroenterology

## 2023-01-10 ENCOUNTER — Telehealth: Payer: Medicare Other | Admitting: Family Medicine

## 2023-01-10 DIAGNOSIS — M25522 Pain in left elbow: Secondary | ICD-10-CM | POA: Diagnosis not present

## 2023-01-10 NOTE — Progress Notes (Signed)
Virtual Visit Consent   Carrie Reynolds, you are scheduled for a virtual visit with a  provider today. Just as with appointments in the office, your consent must be obtained to participate. Your consent will be active for this visit and any virtual visit you may have with one of our providers in the next 365 days. If you have a MyChart account, a copy of this consent can be sent to you electronically.  As this is a virtual visit, video technology does not allow for your provider to perform a traditional examination. This may limit your provider's ability to fully assess your condition. If your provider identifies any concerns that need to be evaluated in person or the need to arrange testing (such as labs, EKG, etc.), we will make arrangements to do so. Although advances in technology are sophisticated, we cannot ensure that it will always work on either your end or our end. If the connection with a video visit is poor, the visit may have to be switched to a telephone visit. With either a video or telephone visit, we are not always able to ensure that we have a secure connection.  By engaging in this virtual visit, you consent to the provision of healthcare and authorize for your insurance to be billed (if applicable) for the services provided during this visit. Depending on your insurance coverage, you may receive a charge related to this service.  I need to obtain your verbal consent now. Are you willing to proceed with your visit today? Carrie Reynolds has provided verbal consent on 01/10/2023 for a virtual visit (video or telephone). Freddy Finner, NP  Date: 01/10/2023 10:58 AM  Virtual Visit via Video Note   I, Freddy Finner, connected with  Carrie Reynolds  ([id number], November 10, 1955) on 01/10/23 at 11:00 AM EDT by a video-enabled telemedicine application and verified that I am speaking with the correct person using two identifiers.  Location: Patient: Virtual Visit Location  Patient: Home Provider: Virtual Visit Location Provider: Home Office   I discussed the limitations of evaluation and management by telemedicine and the availability of in person appointments. The patient expressed understanding and agreed to proceed.    History of Present Illness: Carrie Reynolds is a 67 y.o. who identifies as a female who was assigned female at birth, and is being seen today for tennis elbow.  Onset was 4 months - nothing triggered it that she is aware of- but reports moving things- pain in elbow Associated symptoms are starts anterior aspect of elbow radiates to the wrist, hand, and up arm to shoulder area. Starting yesterday if she reaches for something she is having pain 4-5/10.  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   Ulcer of small intestine 09/06/2021   Acute blood loss anemia 09/06/2021   Iron deficiency 09/06/2021   Colon cancer screening 09/06/2021   Long term (current) use of antithrombotics/antiplatelets 09/06/2021   Jejunal ulcer 08/21/2021   Syncope 07/15/2021   Sinus bradycardia 11/03/2019   Atypical angina 10/12/2019   Fatigue 10/12/2019   Presence of 2 overlapping Drug Coated Stents in LAD coronary artery    Coronary artery disease involving native coronary artery of native heart with angina pectoris ([facility])  06/17/2019   DOE (dyspnea on exertion) 06/16/2019   CAD S/P DES PCI-proximal LAD 05/20/2019   Agatston coronary artery calcium score greater than 400 04/16/2019   Migraine with vertigo 07/29/2018   Rapid palpitations 04/16/2017   Systolic murmur 04/16/2017   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   OSA on CPAP 11/19/2013   Hyperlipidemia with target LDL less than 70 07/29/2013    Obesity (BMI 30.0-34.9) 04/25/2012   Depression with anxiety 04/25/2012   Environmental allergies 04/25/2012   Essential (primary) hypertension 04/25/2012   Chiari malformation type I ([facility]) 01/03/2012   Postmenopausal 09/18/2011    Allergies:  Allergies  Allergen Reactions   Ceclor [Cefaclor] Hives and Rash   Tetracyclines & Related Hives   Aspirin Other (See Comments)    Patient reports she does not take -hx of stomach ulcer 06/2021   Lipitor [Atorvastatin Calcium] Cough   Penicillins Swelling and Other (See Comments)    Has patient had a PCN reaction causing immediate rash, facial/tongue/throat swelling, SOB or lightheadedness with hypotension: No Has patient had a PCN reaction causing severe rash involving mucus membranes or skin necrosis: No Has patient had a PCN reaction that required hospitalization: Yes Has patient had a PCN reaction occurring within the last 10 years: No If all of the above answers are "NO", then may proceed with Cephalosporin use.   Amoxicillin Rash   Ampicillin Rash   Lisinopril Cough   Simvastatin Cough   Medications:  Current Outpatient Medications:    acetaminophen (TYLENOL) 500 MG tablet, Take 1,000 mg by mouth every 6 (six) hours as needed for mild pain, moderate pain or headache., Disp: , Rfl:    AMBULATORY NON FORMULARY MEDICATION, Medication Name: GI Cocktail 90 ml Viscous Lidocaine, 90 ML Dicyclomine 10 mg/26ml, 270 ML Maalox, Total of 450 ML. 5cc or 10 cc every 4 to 6 hours as needed for Epigastric pain., Disp: 450 mL, Rfl: 0   cetirizine (ZYRTEC) 10 MG tablet, Take 10 mg by mouth daily as needed for allergies., Disp: , Rfl:    clopidogrel (PLAVIX) 75 MG tablet, TAKE 1 TABLET BY MOUTH EVERY DAY (Patient taking differently: Take 75 mg by mouth daily.), Disp: 90 tablet, Rfl: 3   diclofenac Sodium (VOLTAREN) 1 % GEL, Apply 2 g topically daily as needed (Pain)., Disp: , Rfl:    famotidine (PEPCID) 40 MG tablet, Take 1 tablet (40 mg total) by mouth 2  (two) times daily., Disp: 60 tablet, Rfl: 3   ferrous sulfate 325 (65 FE) MG tablet, Take 1 tablet (325 mg total) by mouth daily with breakfast., Disp: 30 tablet, Rfl: 2   hydrochlorothiazide (HYDRODIURIL) 12.5 MG tablet, Take 12.5 mg by mouth daily., Disp: , Rfl:    isosorbide mononitrate (IMDUR) 30 MG 24 hr tablet, Take 1 tablet (30 mg total) by mouth daily., Disp: 30 tablet, Rfl: 11   losartan-hydrochlorothiazide (HYZAAR) 100-12.5 MG tablet, Take 0.5 tablets by mouth daily. (Patient taking differently: Take 1 tablet by mouth daily.), Disp: 30 tablet, Rfl: 11   nitroGLYCERIN (NITROSTAT) 0.4 MG SL tablet, Place 1 tablet (0.4 mg total) under the tongue every 5 (five) minutes as needed., Disp: 25 tablet, Rfl: 2   omeprazole (PRILOSEC) 40 MG capsule, Take 1 capsule by mouth twice daily. May open capsule and place in applesauce or yogurt., Disp: 60 capsule, Rfl: 6   propranolol (INDERAL) 10 MG tablet, May take  1 to 2  tablets a day  as needed for palpiptation (Patient taking differently: Take 10 mg by  mouth every 8 (eight) hours as needed (panic attack).), Disp: 30 tablet, Rfl: 11   rosuvastatin (CRESTOR) 40 MG tablet, Take 40 mg by mouth daily., Disp: , Rfl:    sertraline (ZOLOFT) 100 MG tablet, Take 100-200 mg by mouth daily as needed ([place])., Disp: , Rfl:    sucralfate (CARAFATE) 1 g tablet, TAKE 1 TABLET (1 G TOTAL) BY MOUTH 4 TIMES A DAY WITH MEALS AND AT BEDTIME, Disp: 90 tablet, Rfl: 0  Observations/Objective: Patient is well-developed, well-nourished in no acute distress.  Resting comfortably  at home.  Head is normocephalic, atraumatic.  No labored breathing.  Speech is clear and coherent with logical content.  Patient is alert and oriented at baseline.  Tennis elbow brace in place  Assessment and Plan:  1. Left elbow pain  Discussed on going care at home and the addition of steroid dose pack. She does not feel the dose pack will help much and is opting to be seen in person at the  [place] office she has been seen at in the past. No medications ordered at this visit  Patient acknowledged agreement and understanding of the plan.    Follow Up Instructions: I discussed the assessment and treatment plan with the patient. The patient was provided an opportunity to ask questions and all were answered. The patient agreed with the plan and demonstrated an understanding of the instructions.  A copy of instructions were sent to the patient via MyChart unless otherwise noted below.    The patient was advised to call back or seek an in-person evaluation if the symptoms worsen or if the condition fails to improve as anticipated.  Time:  I spent 10 minutes with the patient via telehealth technology discussing the above problems/concerns.    Freddy Finner, NP

## 2023-01-10 NOTE — Patient Instructions (Signed)
Carrie Reynolds, thank you for joining Carrie Finner, NP for today's virtual visit.  While this provider is not your primary care provider (PCP), if your PCP is located in our provider database this encounter information will be shared with them immediately following your visit.   A [facility] MyChart account gives you access to today's visit and all your visits, tests, and labs performed at [facility] " click here if you don't have a  MyChart account or go to [email or web address]  Consent: (Patient) Carrie Reynolds provided verbal consent for this virtual visit at the beginning of the encounter.  Current Medications:  Current Outpatient Medications:    acetaminophen (TYLENOL) 500 MG tablet, Take 1,000 mg by mouth every 6 (six) hours as needed for mild pain, moderate pain or headache., Disp: , Rfl:    AMBULATORY NON FORMULARY MEDICATION, Medication Name: GI Cocktail 90 ml Viscous Lidocaine, 90 ML Dicyclomine 10 mg/5ml, 270 ML Maalox, Total of 450 ML. 5cc or 10 cc every 4 to 6 hours as needed for Epigastric pain., Disp: 450 mL, Rfl: 0   cetirizine (ZYRTEC) 10 MG tablet, Take 10 mg by mouth daily as needed for allergies., Disp: , Rfl:    clopidogrel (PLAVIX) 75 MG tablet, TAKE 1 TABLET BY MOUTH EVERY DAY (Patient taking differently: Take 75 mg by mouth daily.), Disp: 90 tablet, Rfl: 3   diclofenac Sodium (VOLTAREN) 1 % GEL, Apply 2 g topically daily as needed (Pain)., Disp: , Rfl:    famotidine (PEPCID) 40 MG tablet, Take 1 tablet (40 mg total) by mouth 2 (two) times daily., Disp: 60 tablet, Rfl: 3   ferrous sulfate 325 (65 FE) MG tablet, Take 1 tablet (325 mg total) by mouth daily with breakfast., Disp: 30 tablet, Rfl: 2   hydrochlorothiazide (HYDRODIURIL) 12.5 MG tablet, Take 12.5 mg by mouth daily., Disp: , Rfl:    isosorbide mononitrate (IMDUR) 30 MG 24 hr tablet, Take 1 tablet (30 mg total) by mouth daily., Disp: 30 tablet, Rfl: 11    losartan-hydrochlorothiazide (HYZAAR) 100-12.5 MG tablet, Take 0.5 tablets by mouth daily. (Patient taking differently: Take 1 tablet by mouth daily.), Disp: 30 tablet, Rfl: 11   nitroGLYCERIN (NITROSTAT) 0.4 MG SL tablet, Place 1 tablet (0.4 mg total) under the tongue every 5 (five) minutes as needed., Disp: 25 tablet, Rfl: 2   omeprazole (PRILOSEC) 40 MG capsule, Take 1 capsule by mouth twice daily. May open capsule and place in applesauce or yogurt., Disp: 60 capsule, Rfl: 6   propranolol (INDERAL) 10 MG tablet, May take  1 to 2  tablets a day  as needed for palpiptation (Patient taking differently: Take 10 mg by mouth every 8 (eight) hours as needed (panic attack).), Disp: 30 tablet, Rfl: 11   rosuvastatin (CRESTOR) 40 MG tablet, Take 40 mg by mouth daily., Disp: , Rfl:    sertraline (ZOLOFT) 100 MG tablet, Take 100-200 mg by mouth daily as needed (Mood)., Disp: , Rfl:    sucralfate (CARAFATE) 1 g tablet, TAKE 1 TABLET (1 G TOTAL) BY MOUTH 4 TIMES A DAY WITH MEALS AND AT BEDTIME, Disp: 90 tablet, Rfl: 0   Medications ordered in this encounter:  No orders of the defined types were placed in this encounter.    *If you need refills on other medications prior to your next appointment, please contact your pharmacy*  Follow-Up: Call back or seek an in-person evaluation if the symptoms worsen or if the condition fails to improve as anticipated.  [facility] [phone]  Other Instructions  Follow up with Ortho Provider   If you have been instructed to have an in-person evaluation today at a local Urgent Care facility, please use the link below. It will take you to a list of all of our available [facility], including address, phone number and hours of operation. Please do not delay care.  [facility]  If you or a family member do not have a primary care provider, use the link below to schedule a visit and establish care. When you choose a [facility]  primary care physician or advanced practice provider, you gain a long-term partner in health. Find a Primary Care Provider  Learn more about [facility] in-office and virtual care options:  - Get Care Now

## 2023-01-23 ENCOUNTER — Other Ambulatory Visit (INDEPENDENT_AMBULATORY_CARE_PROVIDER_SITE_OTHER): Payer: Medicare Other

## 2023-01-23 ENCOUNTER — Ambulatory Visit (INDEPENDENT_AMBULATORY_CARE_PROVIDER_SITE_OTHER): Payer: Medicare Other | Admitting: Surgical

## 2023-01-23 DIAGNOSIS — M25522 Pain in left elbow: Secondary | ICD-10-CM

## 2023-01-27 ENCOUNTER — Encounter: Payer: Self-pay | Admitting: Surgical

## 2023-01-27 NOTE — Progress Notes (Signed)
Office Visit Note   Patient: Carrie Reynolds           Date of Birth: [birth year]           MRN: [id number] Visit Date: 01/23/2023 Requested by: Julieanne Cotton, PA-C [address] [place],  [place] [address] PCP: Kaleen Mask, MD  Subjective: Chief Complaint  Patient presents with   Left Elbow - Pain    HPI: Carrie Reynolds is a 67 y.o. female who presents to the office reporting left elbow pain.  Patient states that she has had 4 months of pain without any history of injury.  The only possible inciting event she can recall is having blood drawn from this elbow and she noticed swelling after the blood draw.  Massage helps.  The pad helps the most.  She has been using tennis elbow brace and doing at home exercises without any relief.  She describes dorsal elbow pain that she describes as a aching and burning sensation with radiation that goes down the forearm into her thumb on the dorsal aspect as well as up to the bicep muscle belly at times.  No numbness or tingling, neck pain, scapular pain.  No history of prior surgery to the elbow.  Denies any stiffness or mechanical/locking symptoms.  Pain will wake her up at night..                ROS: All systems reviewed are negative as they relate to the chief complaint within the history of present illness.  Patient denies fevers or chills.  Assessment & Plan: Visit Diagnoses:  1. Pain in left elbow     Plan: Patient is a 67 year old female who presents for evaluation of left elbow pain.  Has 4 months of symptoms that have not been relieved with tennis elbow exercises or tennis elbow brace.  Describes a burning sensation in the dorsal aspect of forearm that radiates to the thumb and occasionally to the bicep region.  Also has aching symptoms in this region as well.  With lack of tenderness over the lateral condyle and the lack of relief from typical exercises for tennis elbow, patient may be experiencing radial tunnel syndrome.   Unsure if this is related to the blood draw that she had from this left elbow prior to her symptom onset.  We will obtain left arm nerve conduction study to evaluate for radial tunnel syndrome with Dr. Alvester Morin and follow-up with Dr. August Saucer afterwards for his evaluation.  Try lidocaine patches in the meantime and let us know if this does not help and she would like to try something else.  Follow-Up Instructions: No follow-ups on file.   Orders:  Orders Placed This Encounter  Procedures   XR Elbow Complete Left (3+View)   No orders of the defined types were placed in this encounter.     Procedures: No procedures performed   Clinical Data: No additional findings.  Objective: Vital Signs: LMP 07/24/2003 (Within Months)   Physical Exam:  Constitutional: Patient appears well-developed HEENT:  Head: Normocephalic Eyes:EOM are normal Neck: Normal range of motion Cardiovascular: Normal rate Pulmonary/chest: Effort normal Neurologic: Patient is alert Skin: Skin is warm Psychiatric: Patient has normal mood and affect  Ortho Exam: Ortho exam demonstrates left elbow without any swelling or ecchymosis.  No cellulitis.  She has full active and passive range of motion of the left elbow in regards to extension/flexion and pronation/supination.  Intact EPL, FPL, finger abduction.  Mild weakness with  finger abduction rated 5 -/5.  Intact pronation and supination strength.  Pronation supination actively do cause some reproduction of pain.  Does not really have much reproduction of pain from resisted wrist extension or grip strength testing.  She has no tenderness over the medial epicondyle, bicep tendon, tricep tendon insertion.  Very minimal tenderness over the lateral condyle with more pronounced moderate tenderness over the area of the radial tunnel.  Specialty Comments:  MRI LUMBAR SPINE FINDINGS   Segmentation: Assumed standard. The last well-formed disc space is designated L5-S1 for the  purposes of this report.   Alignment:  Physiologic.   Vertebrae: No fracture, evidence of discitis, or bone lesion. Small hemangioma in the L1 vertebral body.   Conus medullaris and cauda equina: Conus extends to the L1 level. Conus and cauda equina appear normal.   Paraspinal and other soft tissues: Negative.   Disc levels:   T12-L1:  Negative.   L1-L2:  Negative.   L2-L3:  Negative.   L3-L4: Small disc bulge, asymmetric to the left, with annular fissure. Moderate right greater than left facet arthropathy with small right facet joint effusion. No stenosis.   L4-L5: Small disc bulge and annular fissure. Moderate bilateral facet arthropathy with ligamentum flavum hypertrophy. Small right synovial cyst projecting into the paraspinous soft tissues. Mild bilateral neuroforaminal stenosis. No spinal canal stenosis.   L5-S1:  Mild to moderate bilateral facet arthropathy.  No stenosis.   IMPRESSION: 1. No acute traumatic injury within the cervical, thoracic, or lumbar spine. 2. Mild degenerative disc disease at C4-C5 and C5-C6. 3. Mild degenerative disc disease and moderate bilateral facet arthropathy at L3-L4 and L4-L5. Mild bilateral neuroforaminal stenosis at L4-L5. 4. 2.3 cm indeterminate right adrenal gland nodule. Recommend nonemergent adrenal protocol CT with and without contrast for further evaluation. This recommendation follows ACR consensus guidelines: Management of Incidental Adrenal Masses: A White Paper of the ACR Incidental Findings Committee. J Am Coll Radiol 2017;14:1038-1044.     Electronically Signed   By: Obie Dredge M.D.   On: 10/17/2017 16:56  Imaging: No results found.   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   Ulcer of small intestine 09/06/2021   Acute blood loss anemia 09/06/2021   Iron deficiency 09/06/2021   Colon cancer screening  09/06/2021   Long term (current) use of antithrombotics/antiplatelets 09/06/2021   Jejunal ulcer 08/21/2021   Syncope 07/15/2021   Sinus bradycardia 11/03/2019   Atypical angina 10/12/2019   Fatigue 10/12/2019   Presence of 2 overlapping Drug Coated Stents in LAD coronary artery    Coronary artery disease involving native coronary artery of native heart with angina pectoris (HCC) 06/17/2019   DOE (dyspnea on exertion) 06/16/2019   CAD S/P DES PCI-proximal LAD 05/20/2019   Agatston coronary artery calcium score greater than 400 04/16/2019   Migraine with vertigo 07/29/2018   Rapid palpitations 04/16/2017   Systolic murmur 04/16/2017   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   OSA on CPAP 11/19/2013   Hyperlipidemia with target LDL less than 70 07/29/2013   Obesity (BMI 30.0-34.9) 04/25/2012   Depression with anxiety 04/25/2012   Environmental allergies 04/25/2012   Essential (primary) hypertension 04/25/2012   Chiari malformation type I (HCC) 01/03/2012   Postmenopausal 09/18/2011   Past Medical History:  Diagnosis Date   Anxiety    Arnold-Chiari malformation (HCC) 2006   CAD S/P PCI 05/06/2019   05/06/2019: prox LAD (@ SPI) PCI  with Resolute Onyx DES 3.5  x 8, Residual 60% PDA, Normal LVF; 07/02/2019: relook Cath for Anterior Ischemia on [place] --> CATH 70% pre-stent/proximal edge dissection --> proximal overlapping DES (Resolute Onyx 3.5 x 8 - new & old stent post-dilated to 3.8 mm)   Cervical strain    syndrome   Cervicogenic headache    Chronic kidney disease    Concussion with loss of consciousness 10/09/2017   CTS (carpal tunnel syndrome)    Depression    GERD (gastroesophageal reflux disease)    Glucose intolerance (impaired glucose tolerance)    High coronary artery calcium score    Score of 1460.  Coronary CTA shows high-grade proximal-mid LAD stenosis (CT FFR 0.50).  Also PDA/PLA 60 to 70% stenosis (CTO FFR 0.75)   History of kidney stones    Hypertension    Migraine  headache    with visual aura   Obesity    OSA (obstructive sleep apnea)    not treated   Postmenopausal    Sleep apnea    Vertigo    post concussive    Family History  Problem Relation Age of Onset   Hypertension Mother    Lung cancer Father    Alzheimer's disease Father    Migraines Father    Hyperlipidemia Sister    Hypertension Sister    Colon cancer Maternal Grandmother    Heart failure Maternal Grandfather    Alzheimer's disease Paternal Grandmother    Lung cancer Paternal Grandfather    Esophageal cancer Neg Hx    Inflammatory bowel disease Neg Hx    Liver disease Neg Hx    Pancreatic cancer Neg Hx    Rectal cancer Neg Hx    Stomach cancer Neg Hx     Past Surgical History:  Procedure Laterality Date   BIOPSY  07/15/2021   Procedure: BIOPSY;  Surgeon: Mansouraty, Netty Starring., MD;  Location: [facility] ENDOSCOPY;  Service: Gastroenterology;;   [name]     right great toe   CHOLECYSTECTOMY N/A 06/05/2018   Procedure: LAPAROSCOPIC CHOLECYSTECTOMY WITH POSSIBLE INTRAOPERATIVE CHOLANGIOGRAM;  Surgeon: Harriette Bouillon, MD;  Location: [facility] OR;  Service: General;  Laterality: N/A;   COLOSTOMY     CORONARY STENT INTERVENTION N/A 05/12/2019   Procedure: CORONARY STENT INTERVENTION;  Surgeon: Marykay Lex, MD;  Location: [facility] INVASIVE CV LAB;  Service: Cardiovascular;;; Culprit-p-mLAD 95% 950% SP1) --> Scoring Balloon PTCA -> DES PCI (Resolute DES 3.5 x 38 - 3.8 mm; 0% LAD& 55% SP1). -->  SP1 was somewhat jailed with TIMI II flow post PCI.   CORONARY STENT INTERVENTION N/A 07/02/2019   Procedure: CORONARY STENT INTERVENTION;  Surgeon: Marykay Lex, MD;  Location: [facility] INVASIVE CV LAB;  Service: Cardiovascular;; * 70% Proximal stent edge dissection (edge of previous stent that is patent) -->  successfully covered with additional RESOLUTE ONYX DES 3.5 mm x 8 mm overlapping proximal edge of previous stent (postdilated to 3.8 mm)   ENTEROSCOPY N/A 07/15/2021   Procedure: ENTEROSCOPY;   Surgeon: Meridee Score Netty Starring., MD;  Location: [facility] ENDOSCOPY;  Service: Gastroenterology;  Laterality: N/A;   GASTRIC BYPASS  11/2015   [facility]   GYNECOLOGIC CRYOSURGERY     HAND SURGERY     LEFT HEART CATH AND CORONARY ANGIOGRAPHY N/A 05/12/2019   Procedure: LEFT HEART CATH AND CORONARY ANGIOGRAPHY;  Surgeon: Marykay Lex, MD;  Location: [facility] INVASIVE CV LAB;  Service: Cardiovascular;;; Culprit-p-mLAD 95% 950% SP1) --> DES PCI.;  RPAV 60% -small caliber vessel, did not  appear flow-limiting (medical management).  EF 55 to 60%.  Normal LVEDP.   LEFT HEART CATH AND CORONARY ANGIOGRAPHY N/A 07/02/2019   Procedure: LEFT HEART CATH AND CORONARY ANGIOGRAPHY;  Surgeon: Marykay Lex, MD;  Location: [facility] INVASIVE CV LAB;  Service: Cardiovascular;; CULPRIT = ~70% proximal edge dissection of previous stent (DES PCI overlapping stent placed) - jails SP1 w/ ~60-70% ostial stenosis.[name] mLAD 25%. RI 30%, RPAV 60%.   LEFT HEART CATH AND CORONARY ANGIOGRAPHY N/A 03/30/2022   Procedure: LEFT HEART CATH AND CORONARY ANGIOGRAPHY;  Surgeon: Orbie Pyo, MD;  Location: [facility] INVASIVE CV LAB;  Service: Cardiovascular;  Laterality: N/A;   NM MYOVIEW LTD  06/24/2019   Eugenie Birks): HIGH RISK.  EF 55%.  Large size, moderate severe defect in the anterior wall with associated anterior hypokinesis.  Suspect LAD territory.   NM MYOVIEW LTD  10/23/2019   Normal EF 55 to 60%.  No EKG changes.  Small size mild severity fixed defect in the apical wall consistent with breast attenuation versus small prior infarct.  Marked improvement from anterior apical perfusion abnormality seen in February 2020.   TRANSTHORACIC ECHOCARDIOGRAM  12/08/2020   Normal LV Fxn - EF 60-65%. No RWMA. Mod Asymmetric Basal Septa LVH with mild Concentric LVH. Gr II DD. Normal Longitudinal Strain. Normal RV size & fxn - normal RAP/CVP.  Normal MV. Mild AoV sclerosis w/ stenosis. Mild Ascending Ao dilation - ~38 mm (borderline).  => No change from 04/2017.    TUBAL LIGATION Bilateral    UPPER GASTROINTESTINAL ENDOSCOPY     VAGINAL HYSTERECTOMY  2003   with LSO and right salpingectomy; right ovary still present   [facility] MONITOR-14-day  01/2021   Predominant rhythm sinus-heart rate range 40 to 124 bpm.  Average 70 bpm.  Rare PVCs and isolated PACs noted.  PAC couplets and triplets also noted as well as trigeminy.  3 atrial runs of 4-5 beats ranging from 102 to 150 bpm.  (2 of 3 noted on patient trigger.  Remainder patient triggers were sinus rhythm with PACs or PVCs); no atrial fibrillation or other sustained arrhythmias noted.   Social History   Occupational History   Occupation: Retired Event organiser: [employer]    Comment: Logistics, warehouse  Tobacco Use   Smoking status: Former    Packs/day: 0.10    Years: 30.00    Additional pack years: 0.00    Total pack years: 3.00    Types: Cigarettes    Quit date: 04/26/2012    Years since quitting: 10.7   Smokeless tobacco: Never  Vaping Use   Vaping Use: Never used  Substance and Sexual Activity   Alcohol use: Yes    Comment: occasional   Drug use: Yes    Types: Marijuana    Comment: weed juice   Sexual activity: Yes    Partners: Male    Birth control/protection: Surgical    Comment: hysterectomy

## 2023-01-28 ENCOUNTER — Other Ambulatory Visit: Payer: Self-pay

## 2023-01-28 DIAGNOSIS — M79602 Pain in left arm: Secondary | ICD-10-CM

## 2023-02-01 ENCOUNTER — Ambulatory Visit (INDEPENDENT_AMBULATORY_CARE_PROVIDER_SITE_OTHER): Payer: Medicare Other | Admitting: Physical Medicine and Rehabilitation

## 2023-02-01 DIAGNOSIS — R202 Paresthesia of skin: Secondary | ICD-10-CM | POA: Diagnosis not present

## 2023-02-01 DIAGNOSIS — M542 Cervicalgia: Secondary | ICD-10-CM | POA: Diagnosis not present

## 2023-02-01 DIAGNOSIS — M25522 Pain in left elbow: Secondary | ICD-10-CM

## 2023-02-01 DIAGNOSIS — M79632 Pain in left forearm: Secondary | ICD-10-CM

## 2023-02-01 DIAGNOSIS — M79602 Pain in left arm: Secondary | ICD-10-CM

## 2023-02-01 NOTE — Progress Notes (Signed)
Left forearm pain into thumb x4 months. Not getting any better. Right hand dominant.   Functional Pain Scale - descriptive words and definitions  Moderate (4)   Constantly aware of pain, can complete ADLs with modification/sleep marginally affected at times/passive distraction is of no use, but active distraction gives some relief. Moderate range order  Average Pain 6   left elbow pain. Patient states that she has had 4 months of pain without any history of injury. The only possible inciting event she can recall is having blood drawn from this elbow and she noticed swelling after the blood draw. Massage helps. The pad helps the most. She has been using tennis elbow brace and doing at home exercises without any relief. She describes dorsal elbow pain that she describes as a aching and burning sensation with radiation that goes down the forearm into her thumb on the dorsal aspect as well as up to the bicep muscle belly at times. No numbness or tingling, neck pain, scapular pain. No history of prior surgery to the elbow. Denies any stiffness or mechanical/locking symptoms. Pain will wake her up at night.[name]

## 2023-02-04 NOTE — Procedures (Signed)
EMG & NCV Findings: Evaluation of the left radial motor nerve showed prolonged distal onset latency (2.7 ms), reduced amplitude (1.1 mV), and decreased conduction velocity (Up Arm-8cm, 55 m/s).  The left median (across palm) sensory nerve showed no response (Palm).  All remaining nerves (as indicated in the following tables) were within normal limits.    All examined muscles (as indicated in the following table) showed no evidence of electrical instability.    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  Localization is somewhat difficult to need be somewhat distal to the radial tunnel.  This appears to be more demyelinating than axonal.  **This electrodiagnostic study is more specific then sensitive for radial tunnel syndrome.   There is no significant electrodiagnostic evidence of any other focal nerve entrapment, brachial plexopathy or cervical radiculopathy.   Recommendations: 1.  Follow-up with referring physician.  Clinical correlation is paramount. 2.  Continue current management of symptoms.  ___________________________ Naaman Plummer FAAPMR Board Certified, American Board of Physical Medicine and Rehabilitation    Nerve Conduction Studies Anti Sensory Summary Table   Stim Site NR Peak (ms) Norm Peak (ms) P-T Amp (V) Norm P-T Amp Site1 Site2 Delta-P (ms) Dist (cm) Vel (m/s) Norm Vel (m/s)  Left Median Acr Palm Anti Sensory (2nd Digit)  31C  Wrist    3.3 <3.6 20.1 >10 Wrist Palm  0.0    Palm *NR  <2.0          Left Radial Anti Sensory (Base 1st Digit)  30.7C  Wrist    2.0 <3.1 54.6  Wrist Base 1st Digit 2.0 0.0    Left Ulnar Anti Sensory (5th Digit)  31.6C  Wrist    3.4 <3.7 21.8 >15.0 Wrist 5th Digit 3.4 14.0 41 >38   Motor Summary Table   Stim Site NR Onset (ms) Norm Onset (ms) O-P Amp (mV) Norm O-P Amp Site1 Site2 Delta-0 (ms) Dist (cm)  Vel (m/s) Norm Vel (m/s)  Left Median Motor (Abd Poll Brev)  31.3C  Wrist    3.3 <4.2 8.8 >5 Elbow Wrist 4.4 24.0 55 >50  Elbow    7.7  7.3         Left Radial Motor (Ext Indicis)  29.2C  8cm    *2.7 <2.5 *1.1 >1.7 Up Arm 8cm 4.1 22.5 *55 >60  Up Arm    6.8  3.2         Left Ulnar Motor (Abd Dig Min)  31.5C  Wrist    3.0 <4.2 8.9 >3        Axilla    6.6  8.8         Site 5    8.7  8.3          EMG   Side Muscle Nerve Root Ins Act Fibs Psw Amp Dur Poly Recrt Int Pat Comment  Left Abd Poll Brev Median C8-T1 Nml Nml Nml Nml Nml 0 Nml Nml   Left 1stDorInt Ulnar C8-T1 Nml Nml Nml Nml Nml 0 Nml Nml   Left ExtIndicis Radial (Post Int) C7-8 Nml Nml Nml Nml Nml 0 Nml Nml   Left ExtDigCom   Nml Nml Nml Nml Nml 0 Nml Nml   Left BrachioRad Radial C5-6 Nml Nml Nml Nml Nml 0 Nml Nml   Left Triceps Radial C6-7-8 Nml Nml Nml Nml Nml 0 Nml Nml   Left Deltoid Axillary C5-6  Nml Nml Nml Nml Nml 0 Nml Nml     Nerve Conduction Studies Anti Sensory Left/Right Comparison   Stim Site L Lat (ms) R Lat (ms) L-R Lat (ms) L Amp (V) R Amp (V) L-R Amp (%) Site1 Site2 L Vel (m/s) R Vel (m/s) L-R Vel (m/s)  Median Acr Palm Anti Sensory (2nd Digit)  31C  Wrist 3.3   20.1   Wrist Palm     Palm             Radial Anti Sensory (Base 1st Digit)  30.7C  Wrist 2.0   54.6   Wrist Base 1st Digit     Ulnar Anti Sensory (5th Digit)  31.6C  Wrist 3.4   21.8   Wrist 5th Digit 41     Motor Left/Right Comparison   Stim Site L Lat (ms) R Lat (ms) L-R Lat (ms) L Amp (mV) R Amp (mV) L-R Amp (%) Site1 Site2 L Vel (m/s) R Vel (m/s) L-R Vel (m/s)  Median Motor (Abd Poll Brev)  31.3C  Wrist 3.3   8.8   Elbow Wrist 55    Elbow 7.7   7.3         Radial Motor (Ext Indicis)  29.2C  8cm *2.7   *1.1   Up Arm 8cm *55    Up Arm 6.8   3.2         Ulnar Motor (Abd Dig Min)  31.5C  Wrist 3.0   8.9         Axilla 6.6   8.8         Site 5 8.7   8.3            Waveforms:

## 2023-02-06 ENCOUNTER — Other Ambulatory Visit: Payer: Self-pay

## 2023-02-06 ENCOUNTER — Telehealth: Payer: Self-pay

## 2023-02-06 DIAGNOSIS — M25522 Pain in left elbow: Secondary | ICD-10-CM

## 2023-02-06 NOTE — Telephone Encounter (Signed)
Patient wanting you to call with EMG/NCV results that she had done last week.

## 2023-02-07 ENCOUNTER — Encounter: Payer: Self-pay | Admitting: Physical Medicine and Rehabilitation

## 2023-02-07 DIAGNOSIS — F4312 Post-traumatic stress disorder, chronic: Secondary | ICD-10-CM | POA: Insufficient documentation

## 2023-02-07 DIAGNOSIS — F331 Major depressive disorder, recurrent, moderate: Secondary | ICD-10-CM | POA: Insufficient documentation

## 2023-02-07 DIAGNOSIS — G43109 Migraine with aura, not intractable, without status migrainosus: Secondary | ICD-10-CM | POA: Insufficient documentation

## 2023-02-07 NOTE — Progress Notes (Signed)
Carrie Reynolds - 67 y.o. female MRN [id number]  Date of birth: Feb 09, 1956  Office Visit Note: Visit Date: 02/01/2023 PCP: Kaleen Mask, MD Referred by: Julieanne Cotton, PA-C  Subjective: Chief Complaint  Patient presents with   Left Hand - Numbness, Pain   HPI:  Carrie Reynolds is a 67 y.o. female who comes in today at the request of Karenann Cai, PA-C for evaluation and management of chronic, worsening and severe pain, numbness and tingling in the Left upper extremities.  Patient is Right hand dominant.  She reports today that she has had 4 to 5 months of significant arm and forearm pain on the left.  She relates the pain in the lateral to the elbow and into the extensor wad.  Some referral to the thumb at times and some referral up into the bicep.  No real numbness or tingling at all.  Feels weak at times.  Relates that she feels like it started after a blood draw that had some swelling that she noted.  She is not sure that it happened right away.  She has had some relief with tennis elbow brace exercises and massage.  She denies any new right-sided symptoms.  She has had no shooting pains down the arm from the neck down.  She has occasional neck pain.  Her case is complicated by history of chronic pain syndrome and PTSD.   I spent more than 30 minutes speaking face-to-face with the patient with 50% of the time in counseling and discussing coordination of care.    Review of Systems  Musculoskeletal:  Positive for back pain and joint pain.  Neurological:  Positive for weakness.  All other systems reviewed and are negative.  Otherwise per HPI.  Assessment & Plan: Visit Diagnoses:    ICD-10-CM   1. Paresthesia of skin  R20.2 NCV with EMG (electromyography)    2. Left arm pain  M79.602     3. Pain of left forearm  M79.632     4. Cervicalgia  M54.2     5. Pain in left elbow  M25.522       Plan: Impression: Chronic several month history of forearm and lateral  elbow pain with referral up and down the arm in the setting of possible temporal distribution around the blood draw.  Failure of conservative care.  Differential diagnosis is myofascial pain syndrome versus epicondylitis versus a radial tunnel syndrome.  Electrodiagnostic study performed.  The above electrodiagnostic study is ABNORMAL and reveals evidence of a left radial nerve entrapment at the forearm (radial tunnel syndrome) affecting motor components.  Localization is somewhat difficult to need be somewhat distal to the radial tunnel.  This appears to be more demyelinating than axonal.  **This electrodiagnostic study is more specific then sensitive for radial tunnel syndrome.   There is no significant electrodiagnostic evidence of any other focal nerve entrapment, brachial plexopathy or cervical radiculopathy.   Recommendations: 1.  Follow-up with referring physician.  Clinical correlation is paramount. 2.  Continue current management of symptoms.  Meds & Orders: No orders of the defined types were placed in this encounter.   Orders Placed This Encounter  Procedures   NCV with EMG (electromyography)    Follow-up: Return for [employer], PA-C.   Procedures: No procedures performed  EMG & NCV Findings: Evaluation of the left radial motor nerve showed prolonged distal onset latency (2.7 ms), reduced amplitude (1.1 mV), and decreased conduction velocity (Up Arm-8cm, 55 m/s).  The  left median (across palm) sensory nerve showed no response (Palm).  All remaining nerves (as indicated in the following tables) were within normal limits.    All examined muscles (as indicated in the following table) showed no evidence of electrical instability.    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  Localization is somewhat difficult to need be somewhat distal to the  radial tunnel.  This appears to be more demyelinating than axonal.  **This electrodiagnostic study is more specific then sensitive for radial tunnel syndrome.   There is no significant electrodiagnostic evidence of any other focal nerve entrapment, brachial plexopathy or cervical radiculopathy.   Recommendations: 1.  Follow-up with referring physician.  Clinical correlation is paramount. 2.  Continue current management of symptoms.  ___________________________ Naaman Plummer FAAPMR Board Certified, American Board of Physical Medicine and Rehabilitation    Nerve Conduction Studies Anti Sensory Summary Table   Stim Site NR Peak (ms) Norm Peak (ms) P-T Amp (V) Norm P-T Amp Site1 Site2 Delta-P (ms) Dist (cm) Vel (m/s) Norm Vel (m/s)  Left Median Acr Palm Anti Sensory (2nd Digit)  31C  Wrist    3.3 <3.6 20.1 >10 Wrist Palm  0.0    Palm *NR  <2.0          Left Radial Anti Sensory (Base 1st Digit)  30.7C  Wrist    2.0 <3.1 54.6  Wrist Base 1st Digit 2.0 0.0    Left Ulnar Anti Sensory (5th Digit)  31.6C  Wrist    3.4 <3.7 21.8 >15.0 Wrist 5th Digit 3.4 14.0 41 >38   Motor Summary Table   Stim Site NR Onset (ms) Norm Onset (ms) O-P Amp (mV) Norm O-P Amp Site1 Site2 Delta-0 (ms) Dist (cm) Vel (m/s) Norm Vel (m/s)  Left Median Motor (Abd Poll Brev)  31.3C  Wrist    3.3 <4.2 8.8 >5 Elbow Wrist 4.4 24.0 55 >50  Elbow    7.7  7.3         Left Radial Motor (Ext Indicis)  29.2C  8cm    *2.7 <2.5 *1.1 >1.7 Up Arm 8cm 4.1 22.5 *55 >60  Up Arm    6.8  3.2         Left Ulnar Motor (Abd Dig Min)  31.5C  Wrist    3.0 <4.2 8.9 >3        Axilla    6.6  8.8         Site 5    8.7  8.3          EMG   Side Muscle Nerve Root Ins Act Fibs Psw Amp Dur Poly Recrt Int Pat Comment  Left Abd Poll Brev Median C8-T1 Nml Nml Nml Nml Nml 0 Nml Nml   Left 1stDorInt Ulnar C8-T1 Nml Nml Nml Nml Nml 0 Nml Nml   Left ExtIndicis Radial (Post Int) C7-8 Nml Nml Nml Nml Nml 0 Nml Nml   Left ExtDigCom   Nml Nml  Nml Nml Nml 0 Nml Nml   Left BrachioRad Radial C5-6 Nml Nml Nml Nml Nml 0 Nml Nml   Left Triceps Radial C6-7-8 Nml Nml Nml Nml Nml 0 Nml Nml   Left Deltoid Axillary C5-6 Nml Nml Nml Nml Nml 0 Nml Nml     Nerve Conduction Studies Anti Sensory Left/Right Comparison   Stim Site L Lat (ms) R Lat (ms) L-R Lat (  ms) L Amp (V) R Amp (V) L-R Amp (%) Site1 Site2 L Vel (m/s) R Vel (m/s) L-R Vel (m/s)  Median Acr Palm Anti Sensory (2nd Digit)  31C  Wrist 3.3   20.1   Wrist Palm     Palm             Radial Anti Sensory (Base 1st Digit)  30.7C  Wrist 2.0   54.6   Wrist Base 1st Digit     Ulnar Anti Sensory (5th Digit)  31.6C  Wrist 3.4   21.8   Wrist 5th Digit 41     Motor Left/Right Comparison   Stim Site L Lat (ms) R Lat (ms) L-R Lat (ms) L Amp (mV) R Amp (mV) L-R Amp (%) Site1 Site2 L Vel (m/s) R Vel (m/s) L-R Vel (m/s)  Median Motor (Abd Poll Brev)  31.3C  Wrist 3.3   8.8   Elbow Wrist 55    Elbow 7.7   7.3         Radial Motor (Ext Indicis)  29.2C  8cm *2.7   *1.1   Up Arm 8cm *55    Up Arm 6.8   3.2         Ulnar Motor (Abd Dig Min)  31.5C  Wrist 3.0   8.9         Axilla 6.6   8.8         Site 5 8.7   8.3            Waveforms:              Clinical History: Electrodiagnostic study of the right upper limb:  IMPRESSION:   Nerve conduction studies done on both upper extremities were within normal limits. No evidence of a neuropathy is seen. EMG evaluation of the right upper extremity is unremarkable, no evidence of an overlying cervical radiculopathy is noted.   Marlan Palau MD 04/06/2015 1:41 PM   [facility]     Objective:  VS:  HT:    WT:   BMI:     BP:   HR: bpm  TEMP: ( )  RESP:  Physical Exam Vitals and nursing note reviewed.  Constitutional:      General: She is not in acute distress.    Appearance: Normal appearance. She is well-developed. She is obese. She is not ill-appearing.  HENT:     Head: Normocephalic and  atraumatic.  Eyes:     Conjunctiva/sclera: Conjunctivae normal.     Pupils: Pupils are equal, round, and reactive to light.  Cardiovascular:     Rate and Rhythm: Normal rate.     Pulses: Normal pulses.  Pulmonary:     Effort: Pulmonary effort is normal.  Musculoskeletal:        General: No swelling, tenderness or deformity.     Right lower leg: No edema.     Left lower leg: No edema.     Comments: Inspection reveals no atrophy of the bilateral APB or FDI or hand intrinsics. There is no swelling, color changes, allodynia or dystrophic changes. There is 5 out of 5 strength in the bilateral wrist extension, finger abduction and long finger flexion. There is intact sensation to light touch in all dermatomal and peripheral nerve distributions.  There is a negative Phalen's test bilaterally. There is a negative Hoffmann's test bilaterally.  There is pain to palpation over the extensor wad and epicondyles.  Skin:    General: Skin is warm  and dry.     Findings: No erythema or rash.  Neurological:     General: No focal deficit present.     Mental Status: She is alert and oriented to person, place, and time.     Cranial Nerves: No cranial nerve deficit.     Sensory: No sensory deficit.     Motor: No weakness or abnormal muscle tone.     Coordination: Coordination normal.     Gait: Gait normal.  Psychiatric:        Mood and Affect: Mood normal.        Behavior: Behavior normal.      Imaging: No results found.

## 2023-02-07 NOTE — Telephone Encounter (Signed)
I called patient and left voice mail message.

## 2023-02-08 ENCOUNTER — Telehealth: Payer: Self-pay | Admitting: Radiology

## 2023-02-08 ENCOUNTER — Other Ambulatory Visit: Payer: Self-pay | Admitting: Surgical

## 2023-02-08 MED ORDER — GABAPENTIN 300 MG PO CAPS: 300.0000 mg | ORAL_CAPSULE | Freq: Three times a day (TID) | ORAL | 0 refills | Status: DC

## 2023-02-08 NOTE — Telephone Encounter (Signed)
I called patient.  She is having enough pain to consider surgery. I think it would be best for her to see Dr August Saucer.  Can you add her in to see Dr August Saucer at 2:15 PM on 02/20/23? She said this time works for her. I will also send in gabapentin to see if this will helps

## 2023-02-08 NOTE — Telephone Encounter (Signed)
Added to schedule.

## 2023-02-08 NOTE — Telephone Encounter (Signed)
Patient requests call from [facility] in regards to the message he left her yesterday.  [phone]

## 2023-02-15 ENCOUNTER — Ambulatory Visit: Payer: Medicare Other | Admitting: Surgical

## 2023-02-20 ENCOUNTER — Ambulatory Visit (INDEPENDENT_AMBULATORY_CARE_PROVIDER_SITE_OTHER): Payer: Medicare Other | Admitting: Orthopedic Surgery

## 2023-02-20 DIAGNOSIS — G5632 Lesion of radial nerve, left upper limb: Secondary | ICD-10-CM | POA: Diagnosis not present

## 2023-02-21 ENCOUNTER — Encounter: Payer: Self-pay | Admitting: Orthopedic Surgery

## 2023-02-21 NOTE — Progress Notes (Signed)
Office Visit Note   Patient: Carrie Reynolds           Date of Birth: 07-29-1955           MRN: [id number] Visit Date: 02/20/2023 Requested by: Carrie Mask, Reynolds [address] [place],  [place] [address] PCP: Carrie Mask, Reynolds  Subjective: Chief Complaint  Patient presents with   Left Elbow - Pain    HPI: Carrie Reynolds is a 67 y.o. female who presents to the office reporting continued left elbow pain.  Localizes the pain in the mobile wad region.  Did have an EMG nerve study on 02/01/2023 which showed evidence of left radial nerve entrapment in the forearm affecting motor components.  She has tried CBD oil and lotion on this area without much relief.  Symptoms ongoing for 5 months.  Pronation and supination is most painful.  She is retired.  She also has bilateral CMC arthritis.  She is on Plavix from 2 heart stents placed.  Dr. Susette Reynolds is her cardiologist..                ROS: All systems reviewed are negative as they relate to the chief complaint within the history of present illness.  Patient denies fevers or chills.  Assessment & Plan: Visit Diagnoses:  1. 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  After long discussion with Carrie Reynolds we decided to proceed with radial tunnel decompression.  The risk and benefits of the procedure are discussed with the patient include not limited to infection nerve and vessel damage incomplete pain resolution as well as incomplete restoration of function.  Patient understands risk and benefits as well as expected rehabilitation time.  All questions answered.  I think with the positive EMG nerve study as well as the symptoms which are localizing discretely to that radial tunnel area in the area of the arcade approach that surgical decompression after 5 months is indicated.  Does not really have any signs or symptoms compatible with tennis elbow.  Follow-Up Instructions: No  follow-ups on file.   Orders:  No orders of the defined types were placed in this encounter.  No orders of the defined types were placed in this encounter.     Procedures: No procedures performed   Clinical Data: No additional findings.  Objective: Vital Signs: LMP 07/24/2003 (Within Months)   Physical Exam:  Constitutional: Patient appears well-developed HEENT:  Head: Normocephalic Eyes:EOM are normal Neck: Normal range of motion Cardiovascular: Normal rate Pulmonary/chest: Effort normal Neurologic: Patient is alert Skin: Skin is warm Psychiatric: Patient has normal mood and affect  Ortho Exam: Ortho exam demonstrates no tenderness at the lateral epicondyle.  No real pain with resisted wrist extension at the lateral epicondyle.  Patient does have pain with resisted supination which she localizes to the radial tunnel region.  No definite paresthesias in the forearm in the radial distribution.  The pain does radiate down along the supinator on the dorsal radius.  Elbow range of motion is full.  Radial pulses intact.  Grip strength is intact.  CMC grind test positive bilaterally.  Specialty Comments:  Electrodiagnostic study of the right upper limb:  IMPRESSION:   Nerve conduction studies done on both upper extremities were within normal limits. No evidence of a neuropathy is seen. EMG evaluation of the right upper extremity is unremarkable, no evidence of an overlying cervical radiculopathy is noted.   Carrie Reynolds 04/06/2015 1:41 PM   [facility]  Imaging: No results found.   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   Ulcer of small intestine 09/06/2021    Acute blood loss anemia 09/06/2021   Iron deficiency 09/06/2021   Colon cancer screening 09/06/2021   Long term (current) use of antithrombotics/antiplatelets 09/06/2021   Jejunal ulcer 08/21/2021   Syncope 07/15/2021   Sinus bradycardia 11/03/2019   Atypical angina 10/12/2019   Fatigue 10/12/2019   Presence of 2 overlapping Drug Coated Stents in LAD coronary artery    Coronary artery disease involving native coronary artery of native heart with angina pectoris (HCC) 06/17/2019   DOE (dyspnea on exertion) 06/16/2019   CAD S/P DES PCI-proximal LAD 05/20/2019   Agatston coronary artery calcium score greater than 400 04/16/2019   Migraine with vertigo 07/29/2018   Tinnitus of both ears 07/29/2018   Rapid palpitations 04/16/2017   Systolic murmur 04/16/2017   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   OSA on CPAP 11/19/2013   Hyperlipidemia with target LDL less than 70 07/29/2013   Obesity (BMI 30.0-34.9) 04/25/2012   Depression with anxiety 04/25/2012   Environmental allergies 04/25/2012   Essential (primary) hypertension 04/25/2012   Chiari malformation type I (HCC) 01/03/2012   Postmenopausal 09/18/2011   Past Medical History:  Diagnosis Date   Anxiety    Arnold-Chiari malformation (HCC) 2006   CAD S/P PCI 05/06/2019   05/06/2019: prox LAD (@ SPI) PCI with Resolute Onyx DES 3.5  x 8, Residual 60% PDA, Normal LVF; 07/02/2019: relook Cath for Anterior Ischemia on Mhoview --> CATH 70% pre-stent/proximal edge dissection --> proximal overlapping DES (Resolute Onyx 3.5 x 8 - new & old stent post-dilated to 3.8 mm)   Cervical strain    syndrome   Cervicogenic headache    Chronic kidney disease    Concussion with loss of consciousness 10/09/2017   CTS (carpal tunnel syndrome)    Depression    GERD (gastroesophageal reflux disease)    Glucose intolerance (impaired glucose tolerance)    High coronary artery calcium score    Score of 1460.  Coronary CTA shows high-grade proximal-mid LAD  stenosis (CT FFR 0.50).  Also PDA/PLA 60 to 70% stenosis (CTO FFR 0.75)   History of kidney stones    Hypertension    Migraine headache    with visual aura   Obesity    OSA (obstructive sleep apnea)    not treated   Postmenopausal    Sleep apnea    Vertigo    post concussive    Family History  Problem Relation Age of Onset   Hypertension Mother    Lung cancer Father    Alzheimer's disease Father    Migraines Father    Hyperlipidemia Sister    Hypertension Sister    Colon cancer Maternal Grandmother    Heart failure Maternal Grandfather    Alzheimer's disease Paternal Grandmother    Lung cancer Paternal Grandfather    Esophageal cancer Neg Hx    Inflammatory bowel disease Neg Hx    Liver disease Neg Hx    Pancreatic cancer Neg Hx    Rectal cancer Neg Hx    Stomach cancer Neg Hx     Past Surgical History:  Procedure  Laterality Date   BIOPSY  07/15/2021   Procedure: BIOPSY;  Surgeon: Mansouraty, Netty Starring., Reynolds;  Location: [facility] ENDOSCOPY;  Service: Gastroenterology;;   BUNIONECTOMY     right great toe   CHOLECYSTECTOMY N/A 06/05/2018   Procedure: LAPAROSCOPIC CHOLECYSTECTOMY WITH POSSIBLE INTRAOPERATIVE CHOLANGIOGRAM;  Surgeon: Harriette Bouillon, Reynolds;  Location: [facility] OR;  Service: General;  Laterality: N/A;   COLOSTOMY     CORONARY STENT INTERVENTION N/A 05/12/2019   Procedure: CORONARY STENT INTERVENTION;  Surgeon: Marykay Lex, Reynolds;  Location: [facility] INVASIVE CV LAB;  Service: Cardiovascular;;; Culprit-p-mLAD 95% 950% SP1) --> Scoring Balloon PTCA -> DES PCI (Resolute DES 3.5 x 38 - 3.8 mm; 0% LAD& 55% SP1). -->  SP1 was somewhat jailed with TIMI II flow post PCI.   CORONARY STENT INTERVENTION N/A 07/02/2019   Procedure: CORONARY STENT INTERVENTION;  Surgeon: Marykay Lex, Reynolds;  Location: [facility] INVASIVE CV LAB;  Service: Cardiovascular;; * 70% Proximal stent edge dissection (edge of previous stent that is patent) -->  successfully covered with additional RESOLUTE ONYX DES 3.5 mm x  8 mm overlapping proximal edge of previous stent (postdilated to 3.8 mm)   ENTEROSCOPY N/A 07/15/2021   Procedure: ENTEROSCOPY;  Surgeon: Meridee Score Netty Starring., Reynolds;  Location: [facility] ENDOSCOPY;  Service: Gastroenterology;  Laterality: N/A;   GASTRIC BYPASS  11/2015   [facility]   GYNECOLOGIC CRYOSURGERY     HAND SURGERY     LEFT HEART CATH AND CORONARY ANGIOGRAPHY N/A 05/12/2019   Procedure: LEFT HEART CATH AND CORONARY ANGIOGRAPHY;  Surgeon: Marykay Lex, Reynolds;  Location: [facility] INVASIVE CV LAB;  Service: Cardiovascular;;; Culprit-p-mLAD 95% 950% SP1) --> DES PCI.;  RPAV 60% -small caliber vessel, did not appear flow-limiting (medical management).  EF 55 to 60%.  Normal LVEDP.   LEFT HEART CATH AND CORONARY ANGIOGRAPHY N/A 07/02/2019   Procedure: LEFT HEART CATH AND CORONARY ANGIOGRAPHY;  Surgeon: Marykay Lex, Reynolds;  Location: [facility] INVASIVE CV LAB;  Service: Cardiovascular;; CULPRIT = ~70% proximal edge dissection of previous stent (DES PCI overlapping stent placed) - jails SP1 w/ ~60-70% ostial stenosis.[name] mLAD 25%. RI 30%, RPAV 60%.   LEFT HEART CATH AND CORONARY ANGIOGRAPHY N/A 03/30/2022   Procedure: LEFT HEART CATH AND CORONARY ANGIOGRAPHY;  Surgeon: Orbie Pyo, Reynolds;  Location: [facility] INVASIVE CV LAB;  Service: Cardiovascular;  Laterality: N/A;   NM MYOVIEW LTD  06/24/2019   Eugenie Birks): HIGH RISK.  EF 55%.  Large size, moderate severe defect in the anterior wall with associated anterior hypokinesis.  Suspect LAD territory.   NM MYOVIEW LTD  10/23/2019   Normal EF 55 to 60%.  No EKG changes.  Small size mild severity fixed defect in the apical wall consistent with breast attenuation versus small prior infarct.  Marked improvement from anterior apical perfusion abnormality seen in February 2020.   TRANSTHORACIC ECHOCARDIOGRAM  12/08/2020   Normal LV Fxn - EF 60-65%. No RWMA. Mod Asymmetric Basal Septa LVH with mild Concentric LVH. Gr II DD. Normal Longitudinal Strain. Normal RV size & fxn - normal  RAP/CVP.  Normal MV. Mild AoV sclerosis w/ stenosis. Mild Ascending Ao dilation - ~38 mm (borderline).  => No change from 04/2017.   TUBAL LIGATION Bilateral    UPPER GASTROINTESTINAL ENDOSCOPY     VAGINAL HYSTERECTOMY  2003   with LSO and right salpingectomy; right ovary still present   [facility] MONITOR-14-day  01/2021   Predominant rhythm sinus-heart rate range 40 to 124 bpm.  Average 70 bpm.  Rare  PVCs and isolated PACs noted.  PAC couplets and triplets also noted as well as trigeminy.  3 atrial runs of 4-5 beats ranging from 102 to 150 bpm.  (2 of 3 noted on patient trigger.  Remainder patient triggers were sinus rhythm with PACs or PVCs); no atrial fibrillation or other sustained arrhythmias noted.   Social History   Occupational History   Occupation: Retired Event organiser: [employer]    Comment: Logistics, warehouse  Tobacco Use   Smoking status: Former    Current packs/day: 0.00    Average packs/day: 0.1 packs/day for 30.0 years (3.0 ttl pk-yrs)    Types: Cigarettes    Start date: 04/26/1982    Quit date: 04/26/2012    Years since quitting: 10.8   Smokeless tobacco: Never  Vaping Use   Vaping status: Never Used  Substance and Sexual Activity   Alcohol use: Yes    Comment: occasional   Drug use: Yes    Types: Marijuana    Comment: weed juice   Sexual activity: Yes    Partners: Male    Birth control/protection: Surgical    Comment: hysterectomy

## 2023-02-22 ENCOUNTER — Telehealth: Payer: Self-pay

## 2023-02-22 NOTE — Telephone Encounter (Signed)
Pts last OV was 04/24/22 with Azalee Course, PA.     Pre-operative Risk Assessment    Patient Name: Carrie Reynolds  DOB: 01/07/1956 MRN: [id number]      Request for Surgical Clearance    Procedure:   Left forearm Radial Nerve Decompression  Date of Surgery:  Clearance TBD                                 Surgeon:  Shelda Altes, MD Surgeon's Group or Practice Name:  [facility] Phone number:  [phone] Fax number:  [phone]   Type of Clearance Requested:   - Medical  - Pharmacy:  Hold Clopidogrel (Plavix) pt will need instructions on when/if to hold   Type of Anesthesia:  General    Additional requests/questions:    Wynetta Fines   02/22/2023, 9:17 AM

## 2023-02-25 ENCOUNTER — Telehealth: Payer: Self-pay | Admitting: *Deleted

## 2023-02-25 NOTE — Telephone Encounter (Signed)
Pt has been scheduled a tele visit, 03/07/23 10:20.  Consent on file / medications reconciled.    Patient Consent for Virtual Visit        Carrie Reynolds has provided verbal consent on 02/25/2023 for a virtual visit (video or telephone).   CONSENT FOR VIRTUAL VISIT FOR:  Carrie Reynolds  By participating in this virtual visit I agree to the following:  I hereby voluntarily request, consent and authorize [facility] and its employed or contracted physicians, physician assistants, nurse practitioners or other licensed health care professionals (the Practitioner), to provide me with telemedicine health care services (the "Services") as deemed necessary by the treating Practitioner. I acknowledge and consent to receive the Services by the Practitioner via telemedicine. I understand that the telemedicine visit will involve communicating with the Practitioner through live audiovisual communication technology and the disclosure of certain medical information by electronic transmission. I acknowledge that I have been given the opportunity to request an in-person assessment or other available alternative prior to the telemedicine visit and am voluntarily participating in the telemedicine visit.  I understand that I have the right to withhold or withdraw my consent to the use of telemedicine in the course of my care at any time, without affecting my right to future care or treatment, and that the Practitioner or I may terminate the telemedicine visit at any time. I understand that I have the right to inspect all information obtained and/or recorded in the course of the telemedicine visit and may receive copies of available information for a reasonable fee.  I understand that some of the potential risks of receiving the Services via telemedicine include:  Delay or interruption in medical evaluation due to technological equipment failure or disruption; Information transmitted may not be  sufficient (e.g. poor resolution of images) to allow for appropriate medical decision making by the Practitioner; and/or  In rare instances, security protocols could fail, causing a breach of personal health information.  Furthermore, I acknowledge that it is my responsibility to provide information about my medical history, conditions and care that is complete and accurate to the best of my ability. I acknowledge that Practitioner's advice, recommendations, and/or decision may be based on factors not within their control, such as incomplete or inaccurate data provided by me or distortions of diagnostic images or specimens that may result from electronic transmissions. I understand that the practice of medicine is not an exact science and that Practitioner makes no warranties or guarantees regarding treatment outcomes. I acknowledge that a copy of this consent can be made available to me via my patient portal [facility] MyChart), or I can request a printed copy by calling the office of [facility].    I understand that my insurance will be billed for this visit.   I have read or had this consent read to me. I understand the contents of this consent, which adequately explains the benefits and risks of the Services being provided via telemedicine.  I have been provided ample opportunity to ask questions regarding this consent and the Services and have had my questions answered to my satisfaction. I give my informed consent for the services to be provided through the use of telemedicine in my medical care

## 2023-02-25 NOTE — Telephone Encounter (Signed)
   Name: Carrie Reynolds  DOB: [birth year]  MRN: [id number]  Primary Cardiologist: Bryan Lemma, MD   Preoperative team, please contact this patient and set up a phone call appointment for further preoperative risk assessment. Please obtain consent and complete medication review. Thank you for your help.  I confirm that guidance regarding antiplatelet and oral anticoagulation therapy has been completed and, if necessary, noted below.  Per office protocol, he may hold Plavix for 5-7 days prior to procedure and should resume as soon as hemodynamically stable postoperatively. If not a high bleeding risk, patient should take aspirin 81 mg throughout perioperative period.    Carlos Levering, NP 02/25/2023, 11:40 AM [facility]

## 2023-02-25 NOTE — Telephone Encounter (Signed)
Pt has been scheduled a tele visit, 03/07/23 10:20.  Consent on file / medications reconciled.

## 2023-03-07 ENCOUNTER — Ambulatory Visit: Payer: Medicare Other | Attending: Cardiology

## 2023-03-07 DIAGNOSIS — Z01810 Encounter for preprocedural cardiovascular examination: Secondary | ICD-10-CM

## 2023-03-07 NOTE — Progress Notes (Signed)
Virtual Visit via Telephone Note   Because of Carrie Reynolds's co-morbid illnesses, she is at least at moderate risk for complications without adequate follow up.  This format is felt to be most appropriate for this patient at this time.  The patient did not have access to video technology/had technical difficulties with video requiring transitioning to audio format only (telephone).  All issues noted in this document were discussed and addressed.  No physical exam could be performed with this format.  Please refer to the patient's chart for her consent to telehealth for [facility].  Evaluation Performed:  Preoperative cardiovascular risk assessment _____________   Date:  03/07/2023   Patient ID:  Carrie Reynolds, DOB 1956-04-28, MRN [id number] Patient Location:  Home Provider location:   Office  Primary Care Provider:  Kaleen Mask, MD Primary Cardiologist:  Bryan Lemma, MD  Chief Complaint / Patient Profile   67 y.o. y/o female with a h/o coronary artery disease, diastolic CHF, anxiety, hypertension, hyperlipidemia who is pending left forearm radial nerve decompression and presents today for telephonic preoperative cardiovascular risk assessment.  History of Present Illness    Carrie Reynolds is a 67 y.o. female who presents via audio/video conferencing for a telehealth visit today.  Pt was last seen in cardiology clinic on 04/24/22 by Azalee Course PA-C.  At that time [name] was doing well .  The patient is now pending procedure as outlined above. Since her last visit, she she continues to be stable from a cardiac standpoint.  Today she denies chest pain, shortness of breath, lower extremity edema, fatigue, palpitations, melena, hematuria, hemoptysis, diaphoresis, weakness, presyncope, syncope, orthopnea, and PND.   Past Medical History    Past Medical History:  Diagnosis Date   Anxiety    Arnold-Chiari malformation (HCC) 2006   CAD S/P  PCI 05/06/2019   05/06/2019: prox LAD (@ SPI) PCI with Resolute Onyx DES 3.5  x 8, Residual 60% PDA, Normal LVF; 07/02/2019: relook Cath for Anterior Ischemia on [place] --> CATH 70% pre-stent/proximal edge dissection --> proximal overlapping DES (Resolute Onyx 3.5 x 8 - new & old stent post-dilated to 3.8 mm)   Cervical strain    syndrome   Cervicogenic headache    Chronic kidney disease    Concussion with loss of consciousness 10/09/2017   CTS (carpal tunnel syndrome)    Depression    GERD (gastroesophageal reflux disease)    Glucose intolerance (impaired glucose tolerance)    High coronary artery calcium score    Score of 1460.  Coronary CTA shows high-grade proximal-mid LAD stenosis (CT FFR 0.50).  Also PDA/PLA 60 to 70% stenosis (CTO FFR 0.75)   History of kidney stones    Hypertension    Migraine headache    with visual aura   Obesity    OSA (obstructive sleep apnea)    not treated   Postmenopausal    Sleep apnea    Vertigo    post concussive   Past Surgical History:  Procedure Laterality Date   BIOPSY  07/15/2021   Procedure: BIOPSY;  Surgeon: Meridee Score Netty Starring., MD;  Location: [facility] ENDOSCOPY;  Service: Gastroenterology;;   [name]     right great toe   CHOLECYSTECTOMY N/A 06/05/2018   Procedure: LAPAROSCOPIC CHOLECYSTECTOMY WITH POSSIBLE INTRAOPERATIVE CHOLANGIOGRAM;  Surgeon: Harriette Bouillon, MD;  Location: [facility] OR;  Service: General;  Laterality: N/A;   COLOSTOMY     CORONARY STENT INTERVENTION N/A 05/12/2019   Procedure: CORONARY  STENT INTERVENTION;  Surgeon: Marykay Lex, MD;  Location: [facility] INVASIVE CV LAB;  Service: Cardiovascular;;; Culprit-p-mLAD 95% 950% SP1) --> Scoring Balloon PTCA -> DES PCI (Resolute DES 3.5 x 38 - 3.8 mm; 0% LAD& 55% SP1). -->  SP1 was somewhat jailed with TIMI II flow post PCI.   CORONARY STENT INTERVENTION N/A 07/02/2019   Procedure: CORONARY STENT INTERVENTION;  Surgeon: Marykay Lex, MD;  Location: [facility] INVASIVE CV LAB;   Service: Cardiovascular;; * 70% Proximal stent edge dissection (edge of previous stent that is patent) -->  successfully covered with additional RESOLUTE ONYX DES 3.5 mm x 8 mm overlapping proximal edge of previous stent (postdilated to 3.8 mm)   ENTEROSCOPY N/A 07/15/2021   Procedure: ENTEROSCOPY;  Surgeon: Meridee Score Netty Starring., MD;  Location[facility] ENDOSCOPY;  Service: Gastroenterology;  Laterality: N/A;   GASTRIC BYPASS  11/2015   [facility]   GYNECOLOGIC CRYOSURGERY     HAND SURGERY     LEFT HEART CATH AND CORONARY ANGIOGRAPHY N/A 05/12/2019   Procedure: LEFT HEART CATH AND CORONARY ANGIOGRAPHY;  Surgeon: Marykay Lex, MD;  Location: [facility] INVASIVE CV LAB;  Service: Cardiovascular;;; Culprit-p-mLAD 95% 950% SP1) --> DES PCI.;  RPAV 60% -small caliber vessel, did not appear flow-limiting (medical management).  EF 55 to 60%.  Normal LVEDP.   LEFT HEART CATH AND CORONARY ANGIOGRAPHY N/A 07/02/2019   Procedure: LEFT HEART CATH AND CORONARY ANGIOGRAPHY;  Surgeon: Marykay Lex, MD;  Location: [facility] INVASIVE CV LAB;  Service: Cardiovascular;; CULPRIT = ~70% proximal edge dissection of previous stent (DES PCI overlapping stent placed) - jails SP1 w/ ~60-70% ostial stenosis.[name] mLAD 25%. RI 30%, RPAV 60%.   LEFT HEART CATH AND CORONARY ANGIOGRAPHY N/A 03/30/2022   Procedure: LEFT HEART CATH AND CORONARY ANGIOGRAPHY;  Surgeon: Orbie Pyo, MD;  Location: [facility] INVASIVE CV LAB;  Service: Cardiovascular;  Laterality: N/A;   NM MYOVIEW LTD  06/24/2019   Eugenie Birks): HIGH RISK.  EF 55%.  Large size, moderate severe defect in the anterior wall with associated anterior hypokinesis.  Suspect LAD territory.   NM MYOVIEW LTD  10/23/2019   Normal EF 55 to 60%.  No EKG changes.  Small size mild severity fixed defect in the apical wall consistent with breast attenuation versus small prior infarct.  Marked improvement from anterior apical perfusion abnormality seen in February 2020.   TRANSTHORACIC ECHOCARDIOGRAM  12/08/2020    Normal LV Fxn - EF 60-65%. No RWMA. Mod Asymmetric Basal Septa LVH with mild Concentric LVH. Gr II DD. Normal Longitudinal Strain. Normal RV size & fxn - normal RAP/CVP.  Normal MV. Mild AoV sclerosis w/ stenosis. Mild Ascending Ao dilation - ~38 mm (borderline).  => No change from 04/2017.   TUBAL LIGATION Bilateral    UPPER GASTROINTESTINAL ENDOSCOPY     VAGINAL HYSTERECTOMY  2003   with LSO and right salpingectomy; right ovary still present   [facility] MONITOR-14-day  01/2021   Predominant rhythm sinus-heart rate range 40 to 124 bpm.  Average 70 bpm.  Rare PVCs and isolated PACs noted.  PAC couplets and triplets also noted as well as trigeminy.  3 atrial runs of 4-5 beats ranging from 102 to 150 bpm.  (2 of 3 noted on patient trigger.  Remainder patient triggers were sinus rhythm with PACs or PVCs); no atrial fibrillation or other sustained arrhythmias noted.    Allergies  Allergies  Allergen Reactions   Ceclor [Cefaclor] Hives and Rash   Tetracyclines & Related Hives  Aspirin Other (See Comments)    Patient reports she does not take -hx of stomach ulcer 06/2021   Lipitor [Atorvastatin Calcium] Cough   Penicillins Swelling and Other (See Comments)    Has patient had a PCN reaction causing immediate rash, facial/tongue/throat swelling, SOB or lightheadedness with hypotension: No Has patient had a PCN reaction causing severe rash involving mucus membranes or skin necrosis: No Has patient had a PCN reaction that required hospitalization: Yes Has patient had a PCN reaction occurring within the last 10 years: No If all of the above answers are "NO", then may proceed with Cephalosporin use.   Amoxicillin Rash   Ampicillin Rash   Lisinopril Cough   Simvastatin Cough    Home Medications    Prior to Admission medications   Medication Sig Start Date End Date Taking? Authorizing Provider  acetaminophen (TYLENOL) 500 MG tablet Take 1,000 mg by mouth every 6 (six) hours as needed for mild  pain, moderate pain or headache.    [provider]  cetirizine (ZYRTEC) 10 MG tablet Take 10 mg by mouth daily as needed for allergies. 10/04/21   [provider]  clopidogrel (PLAVIX) 75 MG tablet TAKE 1 TABLET BY MOUTH EVERY DAY Patient taking differently: Take 75 mg by mouth daily. 11/29/20   Marykay Lex, MD  diclofenac Sodium (VOLTAREN) 1 % GEL Apply 2 g topically daily as needed (Pain). 10/04/21   [provider]  famotidine (PEPCID) 40 MG tablet Take 1 tablet (40 mg total) by mouth 2 (two) times daily. 03/23/22   Unk Lightning, PA  ferrous sulfate 325 (65 FE) MG tablet Take 1 tablet (325 mg total) by mouth daily with breakfast. 09/07/21 05/15/22  Mansouraty, Netty Starring., MD  hydrochlorothiazide (HYDRODIURIL) 12.5 MG tablet Take 12.5 mg by mouth daily. 03/11/22   [provider]  isosorbide mononitrate (IMDUR) 30 MG 24 hr tablet Take 1 tablet (30 mg total) by mouth daily. 03/20/22 03/20/23  Azalee Course, PA  losartan-hydrochlorothiazide (HYZAAR) 100-12.5 MG tablet Take 0.5 tablets by mouth daily. Patient taking differently: Take 1 tablet by mouth daily. 06/16/19   Marykay Lex, MD  nitroGLYCERIN (NITROSTAT) 0.4 MG SL tablet Place 1 tablet (0.4 mg total) under the tongue every 5 (five) minutes as needed. 03/20/22   Azalee Course, PA  omeprazole (PRILOSEC) 40 MG capsule Take 1 capsule by mouth twice daily. May open capsule and place in applesauce or yogurt. 05/04/22   Esterwood, Amy S, PA-C  propranolol (INDERAL) 10 MG tablet May take  1 to 2  tablets a day  as needed for palpiptation Patient taking differently: Take 10 mg by mouth every 8 (eight) hours as needed (panic attack). 08/21/21   Cannon Kettle, PA-C  rosuvastatin (CRESTOR) 40 MG tablet Take 40 mg by mouth daily. 06/27/20   [provider]  sertraline (ZOLOFT) 100 MG tablet Take 100-200 mg by mouth daily as needed (Mood).    [provider]  sucralfate (CARAFATE) 1 g tablet TAKE  1 TABLET (1 G TOTAL) BY MOUTH 4 TIMES A DAY WITH MEALS AND AT BEDTIME Patient taking differently: Take 1 g by mouth as needed. FOR FLARE UP / ULCERS 09/26/22   Mansouraty, Netty Starring., MD    Physical Exam    Vital Signs:  Carrie Reynolds does not have vital signs available for review today.  Given telephonic nature of communication, physical exam is limited. AAOx3. NAD. Normal affect.  Speech and respirations are unlabored.  Accessory Clinical  Findings    None  Assessment & Plan    1.  Preoperative Cardiovascular Risk Assessment: Left forearm Radial Nerve Decompression ,  Shelda Altes, MD , [facility] ,   [place] number:  [phone]       Primary Cardiologist: Bryan Lemma, MD  Chart reviewed as part of pre-operative protocol coverage. Given past medical history and time since last visit, based on ACC/AHA guidelines, Breia Abrew would be at acceptable risk for the planned procedure without further cardiovascular testing.   Her RCRI is a class III risk, 6.6% risk of major cardiac event.  She is able to complete greater than 4 METS of physical activity.  Per office protocol, he may hold Plavix for 5-7 days prior to procedure and should resume as soon as hemodynamically stable postoperatively. If not a high bleeding risk, patient should take aspirin 81 mg throughout perioperative period.   Patient was advised that if she develops new symptoms prior to surgery to contact our office to arrange a follow-up appointment.  She verbalized understanding.  I will route this recommendation to the requesting party via Epic fax function and remove from pre-op pool.       Time:   Today, I have spent 5 minutes with the patient with telehealth technology discussing medical history, symptoms, and management plan.  Prior to her phone evaluation I spent greater than 10 minutes reviewing her past medical history and cardiac medications.   Ronney Asters, NP  03/07/2023, 7:32  AM

## 2023-03-18 ENCOUNTER — Other Ambulatory Visit: Payer: Self-pay | Admitting: Surgical

## 2023-03-18 DIAGNOSIS — G5602 Carpal tunnel syndrome, left upper limb: Secondary | ICD-10-CM

## 2023-03-18 DIAGNOSIS — G5632 Lesion of radial nerve, left upper limb: Secondary | ICD-10-CM

## 2023-03-18 MED ORDER — GABAPENTIN 300 MG PO CAPS: 300.0000 mg | ORAL_CAPSULE | Freq: Three times a day (TID) | ORAL | 0 refills | Status: DC

## 2023-03-18 MED ORDER — TRAMADOL HCL 50 MG PO TABS: 50.0000 mg | ORAL_TABLET | Freq: Four times a day (QID) | ORAL | 0 refills | Status: DC | PRN

## 2023-03-19 ENCOUNTER — Telehealth: Payer: Self-pay | Admitting: Orthopedic Surgery

## 2023-03-19 ENCOUNTER — Other Ambulatory Visit: Payer: Self-pay | Admitting: Surgical

## 2023-03-19 ENCOUNTER — Encounter: Payer: Self-pay | Admitting: Orthopedic Surgery

## 2023-03-19 MED ORDER — HYDROCODONE-ACETAMINOPHEN 5-325 MG PO TABS: 1.0000 | ORAL_TABLET | ORAL | 0 refills | Status: AC | PRN

## 2023-03-19 NOTE — Telephone Encounter (Signed)
Answered her mychart msg

## 2023-03-19 NOTE — Telephone Encounter (Signed)
Patient called she just had surgery and need pain medication and she is swelling. CB#[phone]

## 2023-03-27 ENCOUNTER — Encounter: Payer: Medicare Other | Admitting: Surgical

## 2023-03-27 ENCOUNTER — Ambulatory Visit (INDEPENDENT_AMBULATORY_CARE_PROVIDER_SITE_OTHER): Payer: Medicare Other | Admitting: Orthopedic Surgery

## 2023-03-27 ENCOUNTER — Encounter: Payer: Self-pay | Admitting: Orthopedic Surgery

## 2023-03-27 DIAGNOSIS — G5632 Lesion of radial nerve, left upper limb: Secondary | ICD-10-CM

## 2023-03-27 NOTE — Progress Notes (Signed)
Post-Op Visit Note   Patient: Carrie Reynolds           Date of Birth: 06/29/1956           MRN: [id number] Visit Date: 03/27/2023 PCP: Kaleen Mask, MD   Assessment & Plan:  Chief Complaint:  Chief Complaint  Patient presents with   Other    03/18/23 left radial tunnel decompression   Visit Diagnoses:  1. 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  Doing well overall.  States that her preoperative pain she was experiencing is "gone".  Does have some soreness around the area of the incision but overall she is very satisfied with how she is feeling at this point.  Not taking any medication for pain.  No fevers or chills.  No drainage from the incision.  She has been doing some lifting.  On exam, patient has intact EPL, FPL, finger abduction, grip strength testing, wrist extension without weakness.  Excellent pronation/supination strength.  She has full active and passive range of motion of the left elbow.  Incision looks to be healing well without evidence of infection or dehiscence.  2+ radial pulse of the operative extremity.  Plan is to slowly increase activity as tolerated.  No lifting more than 5 pounds.  Follow-up for final check in 4 weeks and call with any concerns in the meantime.  Avoid submersion but she is okay to do some showering.  Steri-Strips reapplied to the incision today.  Follow-Up Instructions: No follow-ups on file.   Orders:  No orders of the defined types were placed in this encounter.  No orders of the defined types were placed in this encounter.   Imaging: No results found.  PMFS History: Patient Active Problem List   Diagnosis Date Noted   Recurrent major depressive episodes, moderate (HCC) 02/07/2023   Migraine with aura, not intractable, without status migrainosus 02/07/2023   Post-traumatic stress disorder, chronic  02/07/2023   Bloating symptom 09/08/2021   Gastrojejunal ulceration 09/06/2021   Ulcer of small intestine 09/06/2021   Acute blood loss anemia 09/06/2021   Iron deficiency 09/06/2021   Colon cancer screening 09/06/2021   Long term (current) use of antithrombotics/antiplatelets 09/06/2021   Jejunal ulcer 08/21/2021   Syncope 07/15/2021   Sinus bradycardia 11/03/2019   Atypical angina 10/12/2019   Fatigue 10/12/2019   Presence of 2 overlapping Drug Coated Stents in LAD coronary artery    Coronary artery disease involving native coronary artery of native heart with angina pectoris (HCC) 06/17/2019   DOE (dyspnea on exertion) 06/16/2019   CAD S/P DES PCI-proximal LAD 05/20/2019   Agatston coronary artery calcium score greater than 400 04/16/2019   Migraine with vertigo 07/29/2018   Tinnitus of both ears 07/29/2018   Rapid palpitations 04/16/2017   Systolic murmur 04/16/2017   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   OSA on CPAP 11/19/2013   Hyperlipidemia with target LDL less than 70 07/29/2013   Obesity (BMI 30.0-34.9) 04/25/2012   Depression with anxiety 04/25/2012   Environmental allergies 04/25/2012   Essential (primary) hypertension 04/25/2012   Chiari malformation type I (HCC) 01/03/2012   Postmenopausal 09/18/2011   Past Medical History:  Diagnosis Date   Anxiety    Arnold-Chiari malformation (HCC) 2006   CAD S/P PCI 05/06/2019   05/06/2019: prox LAD (@ SPI) PCI with Resolute Onyx DES 3.5  x 8, Residual 60% PDA,  Normal LVF; 07/02/2019: relook Cath for Anterior Ischemia on [place] --> CATH 70% pre-stent/proximal edge dissection --> proximal overlapping DES (Resolute Onyx 3.5 x 8 - new & old stent post-dilated to 3.8 mm)   Cervical strain    syndrome   Cervicogenic headache    Chronic kidney disease    Concussion with loss of consciousness 10/09/2017   CTS (carpal tunnel syndrome)    Depression    GERD (gastroesophageal reflux disease)    Glucose intolerance (impaired glucose  tolerance)    High coronary artery calcium score    Score of 1460.  Coronary CTA shows high-grade proximal-mid LAD stenosis (CT FFR 0.50).  Also PDA/PLA 60 to 70% stenosis (CTO FFR 0.75)   History of kidney stones    Hypertension    Migraine headache    with visual aura   Obesity    OSA (obstructive sleep apnea)    not treated   Postmenopausal    Sleep apnea    Vertigo    post concussive    Family History  Problem Relation Age of Onset   Hypertension Mother    Lung cancer Father    Alzheimer's disease Father    Migraines Father    Hyperlipidemia Sister    Hypertension Sister    Colon cancer Maternal Grandmother    Heart failure Maternal Grandfather    Alzheimer's disease Paternal Grandmother    Lung cancer Paternal Grandfather    Esophageal cancer Neg Hx    Inflammatory bowel disease Neg Hx    Liver disease Neg Hx    Pancreatic cancer Neg Hx    Rectal cancer Neg Hx    Stomach cancer Neg Hx     Past Surgical History:  Procedure Laterality Date   BIOPSY  07/15/2021   Procedure: BIOPSY;  Surgeon: Mansouraty, Netty Starring., MD;  Location: [facility] ENDOSCOPY;  Service: Gastroenterology;;   [name]     right great toe   CHOLECYSTECTOMY N/A 06/05/2018   Procedure: LAPAROSCOPIC CHOLECYSTECTOMY WITH POSSIBLE INTRAOPERATIVE CHOLANGIOGRAM;  Surgeon: Harriette Bouillon, MD;  Location: [facility] OR;  Service: General;  Laterality: N/A;   COLOSTOMY     CORONARY STENT INTERVENTION N/A 05/12/2019   Procedure: CORONARY STENT INTERVENTION;  Surgeon: Marykay Lex, MD;  Location: [facility] INVASIVE CV LAB;  Service: Cardiovascular;;; Culprit-p-mLAD 95% 950% SP1) --> Scoring Balloon PTCA -> DES PCI (Resolute DES 3.5 x 38 - 3.8 mm; 0% LAD& 55% SP1). -->  SP1 was somewhat jailed with TIMI II flow post PCI.   CORONARY STENT INTERVENTION N/A 07/02/2019   Procedure: CORONARY STENT INTERVENTION;  Surgeon: Marykay Lex, MD;  Location: [facility] INVASIVE CV LAB;  Service: Cardiovascular;; * 70% Proximal stent edge  dissection (edge of previous stent that is patent) -->  successfully covered with additional RESOLUTE ONYX DES 3.5 mm x 8 mm overlapping proximal edge of previous stent (postdilated to 3.8 mm)   ENTEROSCOPY N/A 07/15/2021   Procedure: ENTEROSCOPY;  Surgeon: Meridee Score Netty Starring., MD;  Location: [facility] ENDOSCOPY;  Service: Gastroenterology;  Laterality: N/A;   GASTRIC BYPASS  11/2015   [facility]   GYNECOLOGIC CRYOSURGERY     HAND SURGERY     LEFT HEART CATH AND CORONARY ANGIOGRAPHY N/A 05/12/2019   Procedure: LEFT HEART CATH AND CORONARY ANGIOGRAPHY;  Surgeon: Marykay Lex, MD;  Location: [facility] INVASIVE CV LAB;  Service: Cardiovascular;;; Culprit-p-mLAD 95% 950% SP1) --> DES PCI.;  RPAV 60% -small caliber vessel, did not appear flow-limiting (medical management).  EF 55 to 60%.  Normal  LVEDP.   LEFT HEART CATH AND CORONARY ANGIOGRAPHY N/A 07/02/2019   Procedure: LEFT HEART CATH AND CORONARY ANGIOGRAPHY;  Surgeon: Marykay Lex, MD;  Location: [facility] INVASIVE CV LAB;  Service: Cardiovascular;; CULPRIT = ~70% proximal edge dissection of previous stent (DES PCI overlapping stent placed) - jails SP1 w/ ~60-70% ostial stenosis.[name] mLAD 25%. RI 30%, RPAV 60%.   LEFT HEART CATH AND CORONARY ANGIOGRAPHY N/A 03/30/2022   Procedure: LEFT HEART CATH AND CORONARY ANGIOGRAPHY;  Surgeon: Orbie Pyo, MD;  Location: [facility] INVASIVE CV LAB;  Service: Cardiovascular;  Laterality: N/A;   NM MYOVIEW LTD  06/24/2019   Eugenie Birks): HIGH RISK.  EF 55%.  Large size, moderate severe defect in the anterior wall with associated anterior hypokinesis.  Suspect LAD territory.   NM MYOVIEW LTD  10/23/2019   Normal EF 55 to 60%.  No EKG changes.  Small size mild severity fixed defect in the apical wall consistent with breast attenuation versus small prior infarct.  Marked improvement from anterior apical perfusion abnormality seen in February 2020.   TRANSTHORACIC ECHOCARDIOGRAM  12/08/2020   Normal LV Fxn - EF 60-65%. No RWMA. Mod  Asymmetric Basal Septa LVH with mild Concentric LVH. Gr II DD. Normal Longitudinal Strain. Normal RV size & fxn - normal RAP/CVP.  Normal MV. Mild AoV sclerosis w/ stenosis. Mild Ascending Ao dilation - ~38 mm (borderline).  => No change from 04/2017.   TUBAL LIGATION Bilateral    UPPER GASTROINTESTINAL ENDOSCOPY     VAGINAL HYSTERECTOMY  2003   with LSO and right salpingectomy; right ovary still present   [facility] MONITOR-14-day  01/2021   Predominant rhythm sinus-heart rate range 40 to 124 bpm.  Average 70 bpm.  Rare PVCs and isolated PACs noted.  PAC couplets and triplets also noted as well as trigeminy.  3 atrial runs of 4-5 beats ranging from 102 to 150 bpm.  (2 of 3 noted on patient trigger.  Remainder patient triggers were sinus rhythm with PACs or PVCs); no atrial fibrillation or other sustained arrhythmias noted.   Social History   Occupational History   Occupation: Retired Event organiser: [employer]    Comment: Logistics, warehouse  Tobacco Use   Smoking status: Former    Current packs/day: 0.00    Average packs/day: 0.1 packs/day for 30.0 years (3.0 ttl pk-yrs)    Types: Cigarettes    Start date: 04/26/1982    Quit date: 04/26/2012    Years since quitting: 10.9   Smokeless tobacco: Never  Vaping Use   Vaping status: Never Used  Substance and Sexual Activity   Alcohol use: Yes    Comment: occasional   Drug use: Yes    Types: Marijuana    Comment: weed juice   Sexual activity: Yes    Partners: Male    Birth control/protection: Surgical    Comment: hysterectomy

## 2023-04-17 ENCOUNTER — Other Ambulatory Visit: Payer: Self-pay

## 2023-04-17 ENCOUNTER — Telehealth: Payer: Self-pay | Admitting: Cardiology

## 2023-04-17 ENCOUNTER — Emergency Department (HOSPITAL_COMMUNITY): Payer: Medicare Other

## 2023-04-17 ENCOUNTER — Encounter (HOSPITAL_COMMUNITY): Payer: Self-pay

## 2023-04-17 ENCOUNTER — Emergency Department (HOSPITAL_COMMUNITY)
Admission: EM | Admit: 2023-04-17 | Discharge: 2023-04-17 | Disposition: A | Discharge status: Home / Self Care | Payer: Medicare Other | Attending: Emergency Medicine | Admitting: Emergency Medicine

## 2023-04-17 DIAGNOSIS — R0602 Shortness of breath: Secondary | ICD-10-CM | POA: Diagnosis not present

## 2023-04-17 DIAGNOSIS — Z9889 Other specified postprocedural states: Secondary | ICD-10-CM | POA: Insufficient documentation

## 2023-04-17 DIAGNOSIS — Z7901 Long term (current) use of anticoagulants: Secondary | ICD-10-CM | POA: Diagnosis not present

## 2023-04-17 DIAGNOSIS — E669 Obesity, unspecified: Secondary | ICD-10-CM | POA: Diagnosis not present

## 2023-04-17 DIAGNOSIS — E876 Hypokalemia: Secondary | ICD-10-CM | POA: Diagnosis not present

## 2023-04-17 DIAGNOSIS — I129 Hypertensive chronic kidney disease with stage 1 through stage 4 chronic kidney disease, or unspecified chronic kidney disease: Secondary | ICD-10-CM | POA: Insufficient documentation

## 2023-04-17 DIAGNOSIS — N189 Chronic kidney disease, unspecified: Secondary | ICD-10-CM | POA: Diagnosis not present

## 2023-04-17 DIAGNOSIS — I700 Atherosclerosis of aorta: Secondary | ICD-10-CM | POA: Insufficient documentation

## 2023-04-17 DIAGNOSIS — I2510 Atherosclerotic heart disease of native coronary artery without angina pectoris: Secondary | ICD-10-CM | POA: Insufficient documentation

## 2023-04-17 DIAGNOSIS — Z6831 Body mass index (BMI) 31.0-31.9, adult: Secondary | ICD-10-CM | POA: Diagnosis not present

## 2023-04-17 DIAGNOSIS — R7989 Other specified abnormal findings of blood chemistry: Secondary | ICD-10-CM | POA: Insufficient documentation

## 2023-04-17 DIAGNOSIS — R531 Weakness: Secondary | ICD-10-CM | POA: Insufficient documentation

## 2023-04-17 DIAGNOSIS — M79602 Pain in left arm: Secondary | ICD-10-CM | POA: Diagnosis present

## 2023-04-17 DIAGNOSIS — R6884 Jaw pain: Secondary | ICD-10-CM | POA: Diagnosis not present

## 2023-04-17 DIAGNOSIS — R079 Chest pain, unspecified: Secondary | ICD-10-CM

## 2023-04-17 DIAGNOSIS — R0789 Other chest pain: Secondary | ICD-10-CM | POA: Diagnosis not present

## 2023-04-17 DIAGNOSIS — Z87891 Personal history of nicotine dependence: Secondary | ICD-10-CM | POA: Diagnosis not present

## 2023-04-17 LAB — CBC WITH DIFFERENTIAL/PLATELET
Abs Immature Granulocytes: 0.02 10*3/uL (ref 0.00–0.07)
Basophils Absolute: 0 10*3/uL (ref 0.0–0.1)
Basophils Relative: 0 %
Eosinophils Absolute: 0.1 10*3/uL (ref 0.0–0.5)
Eosinophils Relative: 2 %
HCT: 41.8 % (ref 36.0–46.0)
Hemoglobin: 14.1 g/dL (ref 12.0–15.0)
Immature Granulocytes: 0 %
Lymphocytes Relative: 33 %
Lymphs Abs: 1.7 10*3/uL (ref 0.7–4.0)
MCH: 30.1 pg (ref 26.0–34.0)
MCHC: 33.7 g/dL (ref 30.0–36.0)
MCV: 89.1 fL (ref 80.0–100.0)
Monocytes Absolute: 0.3 10*3/uL (ref 0.1–1.0)
Monocytes Relative: 7 %
Neutro Abs: 3 10*3/uL (ref 1.7–7.7)
Neutrophils Relative %: 58 %
Platelets: 182 10*3/uL (ref 150–400)
RBC: 4.69 MIL/uL (ref 3.87–5.11)
RDW: 13.8 % (ref 11.5–15.5)
WBC: 5.1 10*3/uL (ref 4.0–10.5)
nRBC: 0 % (ref 0.0–0.2)

## 2023-04-17 LAB — BASIC METABOLIC PANEL
Anion gap: 12 (ref 5–15)
BUN: 12 mg/dL (ref 8–23)
CO2: 23 mmol/L (ref 22–32)
Calcium: 9.3 mg/dL (ref 8.9–10.3)
Chloride: 102 mmol/L (ref 98–111)
Creatinine, Ser: 0.87 mg/dL (ref 0.44–1.00)
GFR, Estimated: >60 mL/min (ref >60)
Glucose, Bld: 110 mg/dL — ABNORMAL HIGH (ref 70–99)
Potassium: 3.2 mmol/L — ABNORMAL LOW (ref 3.5–5.1)
Sodium: 137 mmol/L (ref 135–145)

## 2023-04-17 LAB — BRAIN NATRIURETIC PEPTIDE: B Natriuretic Peptide: 29.5 pg/mL (ref 0.0–100.0)

## 2023-04-17 LAB — D-DIMER, QUANTITATIVE: D-Dimer, Quant: 0.67 ug/mL-FEU — ABNORMAL HIGH (ref 0.00–0.50)

## 2023-04-17 LAB — TROPONIN I (HIGH SENSITIVITY)
Troponin I (High Sensitivity): <2 ng/L (ref <18)
Troponin I (High Sensitivity): <2 ng/L (ref <18)

## 2023-04-17 MED ORDER — IOHEXOL 350 MG/ML SOLN
75.0000 mL | Freq: Once | INTRAVENOUS | Status: AC | PRN
  Administered 2023-04-17: 75 mL via INTRAVENOUS

## 2023-04-17 MED ORDER — POTASSIUM CHLORIDE CRYS ER 20 MEQ PO TBCR
40.0000 meq | EXTENDED_RELEASE_TABLET | Freq: Once | ORAL | Status: AC
  Administered 2023-04-17: 40 meq via ORAL
  Filled 2023-04-17: qty 2

## 2023-04-17 NOTE — Telephone Encounter (Signed)
   Pt c/o of Chest Pain: STAT if active CP, including tightness, pressure, jaw pain, radiating pain to shoulder/upper arm/back, CP unrelieved by Nitro. Symptoms reported of SOB, nausea, vomiting, sweating.  1. Are you having CP right now? Jaw pain and left arm cramping    2. Are you experiencing any other symptoms (ex. SOB, nausea, vomiting, sweating)? Real short of breath   3. Is your CP continuous or coming and going? constant   4. Have you taken Nitroglycerin? no   5. How long have you been experiencing CP? This morbing    6. If NO CP at time of call then end call with telling Pt to call back or call 911 if Chest pain returns prior to return call from triage team.

## 2023-04-17 NOTE — ED Triage Notes (Addendum)
"  Left arm cramping going in to neck since last night, history of stents 2 years ago and said these symptoms feel the same as then. Also having generalized weakness as well.  Denies chest pain, denies shortness of breath" per EMS Patient also had surgery on left arm 3 weeks ago, but states she does not feel the symptoms are a result of the surgery.

## 2023-04-17 NOTE — ED Provider Notes (Signed)
  Physical Exam  BP 132/66   Pulse 65   Temp 98.1 F (36.7 C)   Resp 16   Ht 5\' 7"  (1.702 m)   Wt 90.7 kg   LMP 07/24/2003 (Within Months)   SpO2 98%   BMI 31.32 kg/m   Physical Exam  Procedures  Procedures  ED Course / MDM    Medical Decision Making Amount and/or Complexity of Data Reviewed Labs: ordered. Radiology: ordered.  Risk Prescription drug management.   Received in signout.[name]  Episodes of fatigue and palpitation's and chest pain.  Workup reassuring.  Troponin negative x 2.  CT PE negative after positive D-dimer.  Appears stable for discharge home.  Reviewed cardiac monitoring without arrhythmia. Mild hypokalemia.  Appears to be low follow-up with PCP and cardiology.      Benjiman Core, MD 04/17/23 1650

## 2023-04-17 NOTE — Telephone Encounter (Signed)
Patient states Left arm and jaw pain currently with chest pain and feels as if she is going to pass out.  She is audibly SOB on the phone.  Advised to call 911 now to be transported.  She is hanging up and calling now

## 2023-04-17 NOTE — ED Provider Notes (Signed)
[place] EMERGENCY DEPARTMENT AT [facility] Provider Note  CSN: [id number] Arrival date & time: 04/17/23 1151  Chief Complaint(s) Weakness  HPI Carrie Reynolds is a 67 y.o. female history of coronary artery disease, hypertension, CKD presenting to the emergency department with left arm pain.  Patient reports left arm pain, jaw pain since yesterday.  Reports associated mild shortness of breath.  Reports had left arm surgery 2 weeks ago.  No arm swelling.  She reports it feels a little bit similar to prior heart issues.  No lightheadedness or dizziness, syncope.  No nausea or vomiting.  No abdominal pain.  No back pain.  Denies similar symptoms in the past.   Past Medical History Past Medical History:  Diagnosis Date   Anxiety    Arnold-Chiari malformation (HCC) 2006   CAD S/P PCI 05/06/2019   05/06/2019: prox LAD (@ SPI) PCI with Resolute Onyx DES 3.5  x 8, Residual 60% PDA, Normal LVF; 07/02/2019: relook Cath for Anterior Ischemia on Mhoview --> CATH 70% pre-stent/proximal edge dissection --> proximal overlapping DES (Resolute Onyx 3.5 x 8 - new & old stent post-dilated to 3.8 mm)   Cervical strain    syndrome   Cervicogenic headache    Chronic kidney disease    Concussion with loss of consciousness 10/09/2017   CTS (carpal tunnel syndrome)    Depression    GERD (gastroesophageal reflux disease)    Glucose intolerance (impaired glucose tolerance)    High coronary artery calcium score    Score of 1460.  Coronary CTA shows high-grade proximal-mid LAD stenosis (CT FFR 0.50).  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   Ulcer of small intestine 09/06/2021   Acute blood loss anemia 09/06/2021   Iron deficiency 09/06/2021   Colon cancer screening 09/06/2021   Long term (current) use of antithrombotics/antiplatelets 09/06/2021   Jejunal ulcer 08/21/2021   Syncope 07/15/2021   Sinus bradycardia 11/03/2019   Atypical angina 10/12/2019   Fatigue 10/12/2019   Presence of 2 overlapping Drug Coated Stents in LAD coronary artery    Coronary artery disease involving native coronary artery of native heart with angina pectoris (HCC) 06/17/2019   DOE (dyspnea on exertion) 06/16/2019   CAD S/P DES PCI-proximal LAD 05/20/2019   Agatston coronary artery calcium score greater than 400 04/16/2019   Migraine with vertigo 07/29/2018   Tinnitus of both ears 07/29/2018   Rapid palpitations 04/16/2017   Systolic murmur 04/16/2017   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   OSA on CPAP 11/19/2013   Hyperlipidemia with target LDL less than 70 07/29/2013   Obesity (BMI 30.0-34.9) 04/25/2012   Depression with anxiety 04/25/2012   Environmental allergies 04/25/2012   Essential (primary) hypertension 04/25/2012   Chiari malformation type I (HCC) 01/03/2012   Postmenopausal 09/18/2011   Home Medication(s) Prior to Admission medications   Medication Sig Start Date End Date Taking? Authorizing Provider  HYDROcodone-acetaminophen (NORCO/VICODIN) 5-325 MG tablet Take 1 tablet by mouth every 4 (four) hours as needed for moderate pain. 03/19/23 03/18/24  Magnant, Joycie Peek, PA-C  acetaminophen (TYLENOL) 500 MG tablet Take 1,000 mg by mouth every 6 (six) hours as needed for mild pain, moderate pain or headache.    [provider]  cetirizine (ZYRTEC) 10 MG tablet Take 10 mg by mouth daily as needed for allergies. 10/04/21   [provider]   clopidogrel (PLAVIX) 75 MG tablet TAKE 1 TABLET BY MOUTH EVERY DAY Patient taking differently: Take 75 mg by mouth daily. 11/29/20   Marykay Lex, MD  diclofenac Sodium (VOLTAREN) 1 % GEL Apply 2 g topically daily as needed (Pain). 10/04/21   [provider]  famotidine (PEPCID) 40 MG tablet Take 1 tablet (40 mg total) by mouth 2 (two) times daily. 03/23/22   Unk Lightning, PA  ferrous sulfate 325 (65 FE) MG tablet Take 1 tablet (325 mg total) by mouth daily with breakfast. 09/07/21 05/15/22  Mansouraty, Netty Starring., MD  gabapentin (NEURONTIN) 300 MG capsule Take 1 capsule (300 mg total) by mouth 3 (three) times daily. 03/18/23 03/17/24  Magnant, Charles L, PA-C  hydrochlorothiazide (HYDRODIURIL) 12.5 MG tablet Take 12.5 mg by mouth daily. 03/11/22   [provider]  isosorbide mononitrate (IMDUR) 30 MG 24 hr tablet Take 1 tablet (30 mg total) by mouth daily. 03/20/22 03/20/23  Azalee Course, PA  losartan-hydrochlorothiazide (HYZAAR) 100-12.5 MG tablet Take 0.5 tablets by mouth daily. Patient taking differently: Take 1 tablet by mouth daily. 06/16/19   Marykay Lex, MD  nitroGLYCERIN (NITROSTAT) 0.4 MG SL tablet Place 1 tablet (0.4 mg total) under the tongue every 5 (five) minutes as needed. 03/20/22   Azalee Course, PA  omeprazole (PRILOSEC) 40 MG capsule Take 1 capsule by mouth twice daily. May open capsule and place in applesauce or yogurt. 05/04/22   Esterwood, Amy S, PA-C  propranolol (INDERAL) 10 MG tablet May take  1 to 2  tablets a day  as needed for palpiptation Patient taking differently: Take 10 mg by mouth every 8 (eight) hours as needed (panic attack). 08/21/21   Cannon Kettle, PA-C  rosuvastatin (CRESTOR) 40 MG tablet Take 40 mg by mouth daily. 06/27/20   [provider]  sertraline (ZOLOFT) 100 MG tablet Take 100-200 mg by mouth daily as needed (Mood).    [provider]  sucralfate (CARAFATE) 1 g tablet TAKE 1 TABLET (1 G TOTAL) BY MOUTH 4 TIMES  A DAY WITH MEALS AND AT BEDTIME Patient taking differently: Take 1 g by mouth as needed. FOR FLARE UP / ULCERS 09/26/22   Mansouraty, Netty Starring., MD                                                                                                                                    Past Surgical History Past Surgical History:  Procedure  Laterality Date   BIOPSY  07/15/2021   Procedure: BIOPSY;  Surgeon: Mansouraty, Netty Starring., MD;  Location: [facility] ENDOSCOPY;  Service: Gastroenterology;;   BUNIONECTOMY     right great toe   CHOLECYSTECTOMY N/A 06/05/2018   Procedure: LAPAROSCOPIC CHOLECYSTECTOMY WITH POSSIBLE INTRAOPERATIVE CHOLANGIOGRAM;  Surgeon: Harriette Bouillon, MD;  Location: [facility] OR;  Service: General;  Laterality: N/A;   COLOSTOMY     CORONARY STENT INTERVENTION N/A 05/12/2019   Procedure: CORONARY STENT INTERVENTION;  Surgeon: Marykay Lex, MD;  Location: [facility] INVASIVE CV LAB;  Service: Cardiovascular;;; Culprit-p-mLAD 95% 950% SP1) --> Scoring Balloon PTCA -> DES PCI (Resolute DES 3.5 x 38 - 3.8 mm; 0% LAD& 55% SP1). -->  SP1 was somewhat jailed with TIMI II flow post PCI.   CORONARY STENT INTERVENTION N/A 07/02/2019   Procedure: CORONARY STENT INTERVENTION;  Surgeon: Marykay Lex, MD;  Location: [facility] INVASIVE CV LAB;  Service: Cardiovascular;; * 70% Proximal stent edge dissection (edge of previous stent that is patent) -->  successfully covered with additional RESOLUTE ONYX DES 3.5 mm x 8 mm overlapping proximal edge of previous stent (postdilated to 3.8 mm)   ENTEROSCOPY N/A 07/15/2021   Procedure: ENTEROSCOPY;  Surgeon: Meridee Score Netty Starring., MD;  Location: [facility] ENDOSCOPY;  Service: Gastroenterology;  Laterality: N/A;   GASTRIC BYPASS  11/2015   [facility]   GYNECOLOGIC CRYOSURGERY     HAND SURGERY     LEFT HEART CATH AND CORONARY ANGIOGRAPHY N/A 05/12/2019   Procedure: LEFT HEART CATH AND CORONARY ANGIOGRAPHY;  Surgeon: Marykay Lex, MD;  Location: [facility] INVASIVE CV LAB;  Service:  Cardiovascular;;; Culprit-p-mLAD 95% 950% SP1) --> DES PCI.;  RPAV 60% -small caliber vessel, did not appear flow-limiting (medical management).  EF 55 to 60%.  Normal LVEDP.   LEFT HEART CATH AND CORONARY ANGIOGRAPHY N/A 07/02/2019   Procedure: LEFT HEART CATH AND CORONARY ANGIOGRAPHY;  Surgeon: Marykay Lex, MD;  Location: [facility] INVASIVE CV LAB;  Service: Cardiovascular;; CULPRIT = ~70% proximal edge dissection of previous stent (DES PCI overlapping stent placed) - jails SP1 w/ ~60-70% ostial stenosis.[name] mLAD 25%. RI 30%, RPAV 60%.   LEFT HEART CATH AND CORONARY ANGIOGRAPHY N/A 03/30/2022   Procedure: LEFT HEART CATH AND CORONARY ANGIOGRAPHY;  Surgeon: Orbie Pyo, MD;  Location: [facility] INVASIVE CV LAB;  Service: Cardiovascular;  Laterality: N/A;   NM MYOVIEW LTD  06/24/2019   Eugenie Birks): HIGH RISK.  EF 55%.  Large size, moderate severe defect in the anterior wall with associated anterior hypokinesis.  Suspect LAD territory.   NM MYOVIEW LTD  10/23/2019   Normal EF 55 to 60%.  No EKG changes.  Small size mild severity fixed defect in the apical wall consistent with breast attenuation versus small prior infarct.  Marked improvement from anterior apical perfusion abnormality seen in February 2020.   TRANSTHORACIC ECHOCARDIOGRAM  12/08/2020   Normal LV Fxn - EF 60-65%. No RWMA. Mod Asymmetric Basal Septa LVH with mild Concentric LVH. Gr II DD. Normal Longitudinal Strain. Normal RV size & fxn - normal RAP/CVP.  Normal MV. Mild AoV sclerosis w/ stenosis. Mild Ascending Ao dilation - ~38 mm (borderline).  => No change from 04/2017.   TUBAL LIGATION Bilateral    UPPER GASTROINTESTINAL ENDOSCOPY     VAGINAL HYSTERECTOMY  2003   with LSO and right salpingectomy; right ovary still present   [facility] MONITOR-14-day  01/2021   Predominant rhythm sinus-heart rate range 40 to 124 bpm.  Average 70 bpm.  Rare PVCs  and isolated PACs noted.  PAC couplets and triplets also noted as well as trigeminy.  3 atrial runs  of 4-5 beats ranging from 102 to 150 bpm.  (2 of 3 noted on patient trigger.  Remainder patient triggers were sinus rhythm with PACs or PVCs); no atrial fibrillation or other sustained arrhythmias noted.   Family History Family History  Problem Relation Age of Onset   Hypertension Mother    Lung cancer Father    Alzheimer's disease Father    Migraines Father    Hyperlipidemia Sister    Hypertension Sister    Colon cancer Maternal Grandmother    Heart failure Maternal Grandfather    Alzheimer's disease Paternal Grandmother    Lung cancer Paternal Grandfather    Esophageal cancer Neg Hx    Inflammatory bowel disease Neg Hx    Liver disease Neg Hx    Pancreatic cancer Neg Hx    Rectal cancer Neg Hx    Stomach cancer Neg Hx     Social History Social History   Tobacco Use   Smoking status: Former    Current packs/day: 0.00    Average packs/day: 0.1 packs/day for 30.0 years (3.0 ttl pk-yrs)    Types: Cigarettes    Start date: 04/26/1982    Quit date: 04/26/2012    Years since quitting: 10.9   Smokeless tobacco: Never  Vaping Use   Vaping status: Never Used  Substance Use Topics   Alcohol use: Yes    Comment: occasional   Drug use: Yes    Types: Marijuana    Comment: weed juice   Allergies Ceclor [cefaclor], Tetracyclines & related, Aspirin, Lipitor [atorvastatin calcium], Penicillins, Amoxicillin, Ampicillin, Lisinopril, and Simvastatin  Review of Systems Review of Systems  All other systems reviewed and are negative.   Physical Exam Vital Signs  I have reviewed the triage vital signs BP 132/66   Pulse 65   Temp 98.1 F (36.7 C)   Resp 16   Ht 5\' 7"  (1.702 m)   Wt 90.7 kg   LMP 07/24/2003 (Within Months)   SpO2 98%   BMI 31.32 kg/m  Physical Exam Vitals and nursing note reviewed.  Constitutional:      General: She is not in acute distress.    Appearance: She is well-developed.  HENT:     Head: Normocephalic and atraumatic.     Mouth/Throat:     Mouth:  Mucous membranes are moist.     Comments: Oropharynx normal Eyes:     Pupils: Pupils are equal, round, and reactive to light.  Cardiovascular:     Rate and Rhythm: Normal rate and regular rhythm.     Heart sounds: No murmur heard. Pulmonary:     Effort: Pulmonary effort is normal. No respiratory distress.     Breath sounds: Normal breath sounds.  Abdominal:     General: Abdomen is flat.     Palpations: Abdomen is soft.     Tenderness: There is no abdominal tenderness.  Musculoskeletal:        General: No tenderness.     Right lower leg: No edema.     Left lower leg: No edema.     Comments: Left arm with well-healed surgical scar in the forearm with no surrounding erythema, warmth, tenderness.  Distal pulse normal.  Distal neuro intact.  Skin:    General: Skin is warm and dry.  Neurological:     General: No focal deficit present.     Mental Status: She  is alert. Mental status is at baseline.  Psychiatric:        Mood and Affect: Mood normal.        Behavior: Behavior normal.     ED Results and Treatments Labs (all labs ordered are listed, but only abnormal results are displayed) Labs Reviewed  BASIC METABOLIC PANEL - Abnormal; Notable for the following components:      Result Value   Potassium 3.2 (*)    Glucose, Bld 110 (*)    All other components within normal limits  D-DIMER, QUANTITATIVE - Abnormal; Notable for the following components:   D-Dimer, Quant 0.67 (*)    All other components within normal limits  CBC WITH DIFFERENTIAL/PLATELET  BRAIN NATRIURETIC PEPTIDE  TROPONIN I (HIGH SENSITIVITY)  TROPONIN I (HIGH SENSITIVITY)                                                                                                                          Radiology DG Chest Port 1 View  Result Date: 04/17/2023 CLINICAL DATA:  Chest pain. EXAM: PORTABLE CHEST 1 VIEW COMPARISON:  July 15, 2021. FINDINGS: The heart size and mediastinal contours are within normal limits.  Both lungs are clear. The visualized skeletal structures are unremarkable. IMPRESSION: No active disease. Electronically Signed   By: Lupita Raider M.D.   On: 04/17/2023 14:26    Pertinent labs & imaging results that were available during my care of the patient were reviewed by me and considered in my medical decision making (see MDM for details).  Medications Ordered in ED Medications  potassium chloride SA (KLOR-CON M) CR tablet 40 mEq (has no administration in time range)  iohexol (OMNIPAQUE) 350 MG/ML injection 75 mL (75 mLs Intravenous Contrast Given 04/17/23 1436)                                                                                                                                     Procedures Procedures  (including critical care time)  Medical Decision Making / ED Course   MDM:  67 year old presenting to the emergency department with left arm and left jaw pain.  Patient well-appearing, ECG reassuring.  Differential includes ACS, pulmonary embolism, patient did have recent surgery and D-dimer was elevated so obtain CT.  Initial troponin negative.  Lower concern for pneumothorax, pneumonia, dissection, DVT, surgical complication, or other acute issue.  Will reassess.  Additional history obtained: -Prior cardiology notes     Lab Tests: -I ordered, reviewed, and interpreted labs.   The pertinent results include:   Labs Reviewed  BASIC METABOLIC PANEL - Abnormal; Notable for the following components:      Result Value   Potassium 3.2 (*)    Glucose, Bld 110 (*)    All other components within normal limits  D-DIMER, QUANTITATIVE - Abnormal; Notable for the following components:   D-Dimer, Quant 0.67 (*)    All other components within normal limits  CBC WITH DIFFERENTIAL/PLATELET  BRAIN NATRIURETIC PEPTIDE  TROPONIN I (HIGH SENSITIVITY)  TROPONIN I (HIGH SENSITIVITY)    Notable for elevated d-dimer, mild hyperkalemia   EKG   EKG  Interpretation Date/Time:  Wednesday April 17 2023 11:59:28 EDT Ventricular Rate:  65 PR Interval:  171 QRS Duration:  108 QT Interval:  412 QTC Calculation: 429 R Axis:   50  Text Interpretation: Sinus rhythm Probable left atrial enlargement Low voltage, precordial leads No significant change since last tracing Confirmed by Alvino Blood ([id number]) on 04/17/2023 12:15:54 PM         Imaging Studies ordered: I ordered imaging studies including CXR On my interpretation imaging demonstrates no acute process I independently visualized and interpreted imaging. I agree with the radiologist interpretation   Medicines ordered and prescription drug management: Meds ordered this encounter  Medications   iohexol (OMNIPAQUE) 350 MG/ML injection 75 mL   potassium chloride SA (KLOR-CON M) CR tablet 40 mEq    -I have reviewed the patients home medicines and have made adjustments as needed    Cardiac Monitoring: The patient was maintained on a cardiac monitor.  I personally viewed and interpreted the cardiac monitored which showed an underlying rhythm of: NSR  Social Determinants of Health:  Diagnosis or treatment significantly limited by social determinants of health: obesity   Reevaluation: After the interventions noted above, I reevaluated the patient and found that their symptoms have improved  Co morbidities that complicate the patient evaluation  Past Medical History:  Diagnosis Date   Anxiety    Arnold-Chiari malformation (HCC) 2006   CAD S/P PCI 05/06/2019   05/06/2019: prox LAD (@ SPI) PCI with Resolute Onyx DES 3.5  x 8, Residual 60% PDA, Normal LVF; 07/02/2019: relook Cath for Anterior Ischemia on Mhoview --> CATH 70% pre-stent/proximal edge dissection --> proximal overlapping DES (Resolute Onyx 3.5 x 8 - new & old stent post-dilated to 3.8 mm)   Cervical strain    syndrome   Cervicogenic headache    Chronic kidney disease    Concussion with loss of consciousness  10/09/2017   CTS (carpal tunnel syndrome)    Depression    GERD (gastroesophageal reflux disease)    Glucose intolerance (impaired glucose tolerance)    High coronary artery calcium score    Score of 1460.  Coronary CTA shows high-grade proximal-mid LAD stenosis (CT FFR 0.50).  Also PDA/PLA 60 to 70% stenosis (CTO FFR 0.75)   History of kidney stones    Hypertension    Migraine headache    with visual aura   Obesity    OSA (obstructive sleep apnea)    not treated   Postmenopausal    Sleep apnea    Vertigo    post concussive      Dispostion: Disposition decision including need for hospitalization was considered, and patient disposition pending at time of sign out.    Final Clinical Impression(s) / ED Diagnoses Final diagnoses:  Left-sided  chest pain     This chart was dictated using voice recognition software.  Despite best efforts to proofread,  errors can occur which can change the documentation meaning.    Lonell Grandchild, MD 04/17/23 [phone]

## 2023-05-12 NOTE — Progress Notes (Deleted)
Cardiology Office Note:  .   Date:  05/12/2023  ID:  Carrie Reynolds, DOB 10/14/1955, MRN [id number] PCP: Kaleen Mask, MD  [facility] Providers Cardiologist:  Bryan Lemma, MD {   History of Present Illness: .   Carrie Reynolds is a 67 y.o. female with a past medical history of CAD, HTN, OSA on CPAP, migraines, sinus bradycardia. Patient is followed by Dr. Herbie Baltimore and presents today for her annual follow up appointment   Per chart review, patient previously underwent coronary CTA in 04/2019 that showed significant stenosis of the prox-mid LAD and R-PDA. She underwent cardiac catheterization on 05/12/19 that showed single-vessel CAD with focal 95% stenosis in the proximal LAD that was treated with scoring balloon angioplasty and DES. There was otherwise moderate ostial and proximal RPA V disease tat was not significant. She wore a cardiac monitor in 06/2019 that was essentially normal, and was without evidence of arrhythmias. Underwent nuclear stress test in 06/2019 that was suggestive of LAD ischemia. Patient was taken back to the cath lab on 07/02/19 and was found to have proximal stent edge dissection in the proximal LAD that was treated with overlapping DES.   Nuclear stress test in 10/2019 was a normal, low risk study. There was normal perfusion and normal LV systolic function. Echocardiogram in 11/2020 showed EF 60-65%, no regional wall motion abnormalities, moderate asymmetric LVH, grade II DD, normal RV function, normal pulmonary artery systolic pressure. She wore a cardiac monitor in 02/2021 that showed NSR, rare PVCs and PACs, no afib. There were 3 4-5 beat runs of atrial tachycardia.   Patient underwent nuclear stress test on 03/15/22 that was electrically negative for ischemia with breath attenuation vs ischemia. Underwent LHC on 03/30/22 that showed widely patent proximal LAD stents with mild disease elsewhere. Recommended GI evaluation   Patient was seen in the ED  on 9/25 complaining of left sided chest pain. hsTn negative x2. CTA chest negative for PE. She had left arm surgery 2 weeks prior to presentation. Was ultimately discharged from ED with instructions to follow up with cardiology   CAD  - Most recent heart catheterization from 03/2022 showed patent stents in proximal LAD, only mild disease elsewhere - Seen in the ED on 9/25 complaining of chest pain. hsTn negative x2. CTA chest negative for PE  - Stress PET  - Continue plavix - Continue imdur 30 mg daily  - Continue crestor 40 mg daily   HTN  - Currently on hydrochlorothiazide 12.5 mg daily, imdur 30 mg daily   HLD   OSA   ROS: ***  Studies Reviewed: .        *** Risk Assessment/Calculations:   {Does this patient have ATRIAL FIBRILLATION?:[phone]} No BP recorded.  {Refresh Note OR Click here to enter BP  :1}***       Physical Exam:   VS:  LMP 07/24/2003 (Within Months)    Wt Readings from Last 3 Encounters:  04/17/23 200 lb (90.7 kg)  06/13/22 204 lb (92.5 kg)  05/15/22 214 lb (97.1 kg)    GEN: Well nourished, well developed in no acute distress NECK: No JVD; No carotid bruits CARDIAC: ***RRR, no murmurs, rubs, gallops RESPIRATORY:  Clear to auscultation without rales, wheezing or rhonchi  ABDOMEN: Soft, non-tender, non-distended EXTREMITIES:  No edema; No deformity   ASSESSMENT AND PLAN: .   ***    {Are you ordering a CV Procedure (e.g. stress test, cath, DCCV, TEE, etc)?   Press F2        :  [id number]}  Dispo: ***  Signed, Jonita Albee, PA-C

## 2023-05-21 ENCOUNTER — Ambulatory Visit: Payer: Medicare Other | Admitting: Cardiology

## 2023-06-11 NOTE — Progress Notes (Unsigned)
  Cardiology Office Note:  .   Date:  06/13/2023  ID:  Carrie Reynolds, DOB 01-06-1956, MRN [id number] PCP: Kaleen Mask, MD  [facility] Providers Cardiologist: Bryan Lemma, MD }   History of Present Illness: .   Carrie Reynolds is a 67 y.o. female h/o coronary artery disease, diastolic CHF, anxiety, OSA on CPAP (through [place]). COVID 3 months ago,  hypertension, hyperlipidemia. Seen in ED on 04/17/2023 for weakness and shortness of breath and left sided chest pain . She was ruled out for ACS, PE. She was released to follow up with cardiology and PCP.   She comes with continue DOE, some palpitations. PCP has ordered Zio monitor. She just mailed it back.  She states that climbing stairs has caused more issues with DOE, but does not always occur.  When she walks she becomes short of breath and dizzy.  She is compliant with her CPAP and all medications.   ROS: As above otherwise negative.   Studies Reviewed: [name]   EKG Interpretation Date/Time:  Thursday June 13 2023 08:36:55 EST Ventricular Rate:  58 PR Interval:  152 QRS Duration:  94 QT Interval:  436 QTC Calculation: 428 R Axis:   11  Text Interpretation: Sinus bradycardia with occasional Premature ventricular complexes When compared with ECG of 17-Apr-2023 11:59, PREVIOUS ECG IS PRESENT Confirmed by Joni Reining [phone]) on 06/13/2023 9:01:52 AM    LHC 03/30/2022 Mid LAD lesion is 25% stenosed.   Ramus lesion is 30% stenosed.   Non-stenotic Prox LAD to Mid LAD lesion with 0% stenosed side branch in 1st Sept was previously treated.   Non-stenotic Prox LAD lesion was previously treated.   1.  Widely patent proximal LAD stents with mild disease elsewhere. 2.  LVEDP of 10 mmHg.    Physical Exam:   VS:  BP 114/86   Pulse (!) 58   Ht 5\' 7"  (1.702 m)   Wt 225 lb 3.2 oz (102.2 kg)   LMP 07/24/2003 (Within Months)   SpO2 90%   BMI 35.27 kg/m    Wt Readings from Last 3 Encounters:  06/13/23 225 lb 3.2  oz (102.2 kg)  04/17/23 200 lb (90.7 kg)  06/13/22 204 lb (92.5 kg)    GEN: Well nourished, well developed in no acute distress NECK: No JVD; No carotid bruits CARDIAC: RRR, bradycardic, no murmurs, rubs, gallops RESPIRATORY:  Clear to auscultation without rales, wheezing or rhonchi  ABDOMEN: Soft, non-tender, non-distended EXTREMITIES:  No edema; No deformity   ASSESSMENT AND PLAN: .    1.Chronic DOE:  She has had COVID in the recent past. Will check PFT's  2. CAD: Stents to the mid LAD side branch and prox LAD. Cath in 03/30/2022 revealed patent stents. Continue medical management with DAPT, statin therapy, BP control and weight management.   3. Hypercholesterolemia: Goal of LDL is less than 70.  Labs are followed by PCP.  Most recent LDL 53, TC 265.   4. OSA: Compliant with CPAP.  [facility] Needs [facility]. Seeing them for follow up.            Signed, Bettey Mare. Liborio Nixon, ANP, AACC

## 2023-06-12 ENCOUNTER — Ambulatory Visit: Payer: Medicare Other | Admitting: Adult Health

## 2023-06-13 ENCOUNTER — Encounter: Payer: Self-pay | Admitting: Adult Health

## 2023-06-13 ENCOUNTER — Ambulatory Visit: Payer: Medicare Other | Attending: Adult Health | Admitting: Adult Health

## 2023-06-13 VITALS — BP 114/86 | HR 58 | Ht 67.0 in | Wt 225.2 lb

## 2023-06-13 DIAGNOSIS — G4733 Obstructive sleep apnea (adult) (pediatric): Secondary | ICD-10-CM

## 2023-06-13 DIAGNOSIS — E7800 Pure hypercholesterolemia, unspecified: Secondary | ICD-10-CM | POA: Diagnosis present

## 2023-06-13 DIAGNOSIS — I2510 Atherosclerotic heart disease of native coronary artery without angina pectoris: Secondary | ICD-10-CM

## 2023-06-13 DIAGNOSIS — R0789 Other chest pain: Secondary | ICD-10-CM | POA: Diagnosis present

## 2023-06-13 DIAGNOSIS — R0602 Shortness of breath: Secondary | ICD-10-CM

## 2023-06-13 NOTE — Patient Instructions (Signed)
Medication Instructions:  No changes *If you need a refill on your cardiac medications before your next appointment, please call your pharmacy*  Lab Work: No labs  Testing/Procedures: Your physician has recommended that you have a pulmonary function test. Pulmonary Function Tests are a group of tests that measure how well air moves in and out of your lungs.   Follow-Up: At [facility], you and your health needs are our priority.  As part of our continuing mission to provide you with exceptional heart care, we have created designated Provider Care Teams.  These Care Teams include your primary Cardiologist (physician) and Advanced Practice Providers (APPs -  Physician Assistants and Nurse Practitioners) who all work together to provide you with the care you need, when you need it.  We recommend signing up for the patient portal called "MyChart".  Sign up information is provided on this After Visit Summary.  MyChart is used to connect with patients for Virtual Visits (Telemedicine).  Patients are able to view lab/test results, encounter notes, upcoming appointments, etc.  Non-urgent messages can be sent to your provider as well.   To learn more about what you can do with MyChart, go to [email or web address].    Your next appointment:   1 month(s)  Provider:   Joni Reining, DNP, ANP

## 2023-06-17 ENCOUNTER — Telehealth: Payer: Self-pay

## 2023-06-17 ENCOUNTER — Ambulatory Visit (HOSPITAL_COMMUNITY)
Admission: RE | Admit: 2023-06-17 | Discharge: 2023-06-17 | Disposition: A | Discharge status: Home / Self Care | Payer: Medicare Other | Source: Ambulatory Visit | Attending: Adult Health | Admitting: Adult Health

## 2023-06-17 DIAGNOSIS — R0602 Shortness of breath: Secondary | ICD-10-CM | POA: Diagnosis not present

## 2023-06-17 DIAGNOSIS — R0789 Other chest pain: Secondary | ICD-10-CM | POA: Diagnosis present

## 2023-06-17 DIAGNOSIS — Z87891 Personal history of nicotine dependence: Secondary | ICD-10-CM | POA: Diagnosis not present

## 2023-06-17 LAB — PULMONARY FUNCTION TEST
DL/VA % pred: 113 %
DL/VA: 4.61 ml/min/mmHg/L
DLCO unc % pred: 113 %
DLCO unc: 24.95 ml/min/mmHg
FEF 25-75 Post: 2.78 L/s
FEF 25-75 Pre: 3.26 L/s
FEF2575-%Change-Post: -14 %
FEF2575-%Pred-Post: 120 %
FEF2575-%Pred-Pre: 141 %
FEV1-%Change-Post: -2 %
FEV1-%Pred-Post: 105 %
FEV1-%Pred-Pre: 108 %
FEV1-Post: 2.87 L
FEV1-Pre: 2.93 L
FEV1FVC-%Change-Post: 0 %
FEV1FVC-%Pred-Pre: 106 %
FEV6-%Change-Post: -1 %
FEV6-%Pred-Post: 103 %
FEV6-%Pred-Pre: 105 %
FEV6-Post: 3.53 L
FEV6-Pre: 3.58 L
FEV6FVC-%Pred-Post: 103 %
FEV6FVC-%Pred-Pre: 103 %
FVC-%Change-Post: -1 %
FVC-%Pred-Post: 99 %
FVC-%Pred-Pre: 101 %
FVC-Post: 3.53 L
FVC-Pre: 3.58 L
Post FEV1/FVC ratio: 81 %
Post FEV6/FVC ratio: 100 %
Pre FEV1/FVC ratio: 82 %
Pre FEV6/FVC Ratio: 100 %
RV % pred: 84 %
RV: 1.89 L
TLC % pred: 100 %
TLC: 5.56 L

## 2023-06-17 MED ORDER — ALBUTEROL SULFATE (2.5 MG/3ML) 0.083% IN NEBU
2.5000 mg | INHALATION_SOLUTION | Freq: Once | RESPIRATORY_TRACT | Status: AC
  Administered 2023-06-17: 2.5 mg via RESPIRATORY_TRACT

## 2023-06-17 NOTE — Telephone Encounter (Addendum)
Called patient regarding results. Patient had understanding of results.----- Message from Joni Reining sent at 06/17/2023  1:21 PM EST ----- I have reviewed the pulmonary function test results. There were found to be normal.  Breathing status is not related to low lung volumes.

## 2023-06-26 ENCOUNTER — Other Ambulatory Visit: Payer: Self-pay | Admitting: Physician Assistant

## 2023-07-05 NOTE — Progress Notes (Deleted)
  Cardiology Office Note:  .   Date:  07/05/2023  ID:  Carrie Reynolds, DOB 1955/11/22, MRN [id number] PCP: Carrie Mask, MD  [facility] Providers Cardiologist:  Bryan Lemma, MD { }   History of Present Illness: .   Carrie Reynolds is a 67 y.o. female  h/o coronary artery disease, diastolic CHF, anxiety, OSA on CPAP (through [place]). COVID 3 months ago,  hypertension, hyperlipidemia. Seen in ED on 04/17/2023 for weakness and shortness of breath and left sided chest pain . She was ruled out for ACS, PE.  Last seen in the office on 06/13/2023.  She continued to have shortness of breath and I scheduled her for PFTs.  They were found to be normal when read by pulmonology.  ROS: ***  Studies Reviewed: .        *** EKG Interpretation Date/Time:    Ventricular Rate:    PR Interval:    QRS Duration:    QT Interval:    QTC Calculation:   R Axis:      Text Interpretation:      Physical Exam:   VS:  LMP 07/24/2003 (Within Months)    Wt Readings from Last 3 Encounters:  06/13/23 225 lb 3.2 oz (102.2 kg)  04/17/23 200 lb (90.7 kg)  06/13/22 204 lb (92.5 kg)    GEN: Well nourished, well developed in no acute distress NECK: No JVD; No carotid bruits CARDIAC: ***RRR, no murmurs, rubs, gallops RESPIRATORY:  Clear to auscultation without rales, wheezing or rhonchi  ABDOMEN: Soft, non-tender, non-distended EXTREMITIES:  No edema; No deformity   ASSESSMENT AND PLAN: .   ***    {Are you ordering a CV Procedure (e.g. stress test, cath, DCCV, TEE, etc)?   Press F2        :[id number]}    Signed, Bettey Mare. Liborio Nixon, ANP, AACC

## 2023-07-11 ENCOUNTER — Encounter: Payer: Self-pay | Admitting: Adult Health

## 2023-07-11 ENCOUNTER — Ambulatory Visit: Payer: Medicare Other | Admitting: Adult Health

## 2023-08-29 ENCOUNTER — Telehealth: Payer: Self-pay | Admitting: Pharmacist Clinician (PhC)/ Clinical Pharmacy Specialist

## 2023-08-29 ENCOUNTER — Other Ambulatory Visit: Payer: Self-pay

## 2023-08-29 ENCOUNTER — Encounter (HOSPITAL_COMMUNITY): Payer: Self-pay | Admitting: Emergency Medicine

## 2023-08-29 ENCOUNTER — Emergency Department (HOSPITAL_COMMUNITY)
Admission: EM | Admit: 2023-08-29 | Discharge: 2023-08-29 | Discharge status: Against Medical Advice | Payer: No Typology Code available for payment source | Attending: Emergency Medicine | Admitting: Emergency Medicine

## 2023-08-29 ENCOUNTER — Emergency Department (HOSPITAL_COMMUNITY): Payer: No Typology Code available for payment source

## 2023-08-29 DIAGNOSIS — Z5321 Procedure and treatment not carried out due to patient leaving prior to being seen by health care provider: Secondary | ICD-10-CM | POA: Insufficient documentation

## 2023-08-29 DIAGNOSIS — Y92410 Unspecified street and highway as the place of occurrence of the external cause: Secondary | ICD-10-CM | POA: Insufficient documentation

## 2023-08-29 DIAGNOSIS — R0602 Shortness of breath: Secondary | ICD-10-CM | POA: Diagnosis not present

## 2023-08-29 DIAGNOSIS — I2510 Atherosclerotic heart disease of native coronary artery without angina pectoris: Secondary | ICD-10-CM | POA: Diagnosis not present

## 2023-08-29 DIAGNOSIS — R079 Chest pain, unspecified: Secondary | ICD-10-CM | POA: Insufficient documentation

## 2023-08-29 LAB — BASIC METABOLIC PANEL
Anion gap: 10 (ref 5–15)
BUN: 22 mg/dL (ref 8–23)
CO2: 23 mmol/L (ref 22–32)
Calcium: 9.9 mg/dL (ref 8.9–10.3)
Chloride: 106 mmol/L (ref 98–111)
Creatinine, Ser: 1.29 mg/dL — ABNORMAL HIGH (ref 0.44–1.00)
GFR, Estimated: 46 mL/min — ABNORMAL LOW (ref >60)
Glucose, Bld: 95 mg/dL (ref 70–99)
Potassium: 3.6 mmol/L (ref 3.5–5.1)
Sodium: 139 mmol/L (ref 135–145)

## 2023-08-29 LAB — CBC
HCT: 40.4 % (ref 36.0–46.0)
Hemoglobin: 13.8 g/dL (ref 12.0–15.0)
MCH: 30.2 pg (ref 26.0–34.0)
MCHC: 34.2 g/dL (ref 30.0–36.0)
MCV: 88.4 fL (ref 80.0–100.0)
Platelets: 175 10*3/uL (ref 150–400)
RBC: 4.57 MIL/uL (ref 3.87–5.11)
RDW: 13.8 % (ref 11.5–15.5)
WBC: 5.7 10*3/uL (ref 4.0–10.5)
nRBC: 0 % (ref 0.0–0.2)

## 2023-08-29 LAB — TROPONIN I (HIGH SENSITIVITY): Troponin I (High Sensitivity): 4 ng/L (ref <18)

## 2023-08-29 NOTE — ED Notes (Signed)
 Pt decided to leave without treatment, IV was removed

## 2023-08-29 NOTE — Telephone Encounter (Signed)
 Ms Carrie Reynolds is a 68 year old female who is followed by Dr. Alm Clay.  She has known coronary artery disease and had undergone prior stenting to her LAD.  The morning, she was going to [employer], and in the parking lot began to notice substernal chest tightness radiating to her back associated with mild diaphoresis and paresthesias.  She did not go to [employer] but instead walked into our office.  In the waiting room she was experiencing chest tightness and pressure and was taken back by one of the nurses.  As DOD today, the code button was called for immediate assessment.  I quickly went to see the patient.  She was lying supine.  Her chest pain was beginning to ease off.  Her blood pressure initially when taken by the nurse was on the low side but on recheck by me was actually elevated at 160/90.  Her chest pain completely resolved.  She did not have significant shortness of breath.  An ECG was taken which showed normal sinus rhythm at 67 bpm without ischemic changes.  Ambulance had been called.  Upon their arrival at 10:32 AM, the patient was stable without chest pain.  He was advised that the patient go to the emergency room for evaluation.  She apparently had not been taking her previous dose of isosorbide  which may have been discontinued.  She was continuing to take Plavix  but did not take aspirin  due to history of ulcer disease.  She has a prescription for sublingual nitroglycerin  but had not taken it for this episode since she had her medicine in a different pocket block.  The patient left the office by EMS chest pain-free with stable hemodynamics.  Debby delena Sor MD, FACC 08/29/2023

## 2023-08-29 NOTE — ED Provider Triage Note (Signed)
 Emergency Medicine Provider Triage Evaluation Note  Carrie Reynolds , a 68 y.o. female  was evaluated in triage.  Pt complains of chest pain and shortness of breath.  Patient was driving in her car, began to experience the symptoms.  Patient has a past medical history of coronary artery disease status post 2 stents..  Review of Systems   Physical Exam  LMP 07/24/2003 (Within Months)  Gen:   Awake, no distress   Resp:  Normal effort  MSK:   Moves extremities without difficulty  Other:  Regular rate and rhythm  Medical Decision Making  Medically screening exam initiated at 11:32 AM.  Appropriate orders placed.  Carrie Reynolds was informed that the remainder of the evaluation will be completed by another provider, this initial triage assessment does not replace that evaluation, and the importance of remaining in the ED until their evaluation is complete.  Six 77-year-old female here today for chest pain since resolved.  Patient does have risk factors for coronary artery disease.  Her initial EKG is nonischemic.  Patient experiencing chest pain right now.  She is concerned that she might of felt panicked after she could not find her nitro.  Patient appropriate for lobby.   Mannie Pac T, DO 08/29/23 1134

## 2023-08-29 NOTE — ED Triage Notes (Signed)
 Pt BIB [facility] with repots of chest pain while driving. Pt reports the pain radiated to her back and jaw. Pt reports hx of angina. Pt reports no chest pain at this time.

## 2023-08-29 NOTE — Telephone Encounter (Signed)
 Patient came into the office shortly after 10 am complaining of chest pain/pressure, dizziness, lightheadedness, SOB and tingling in her lips and hands.  She had just arrived at the [employer] up the street when symptoms started, so she drove herself to our office.   Patient was brought to an exam room and a code was called.   At 10:15 am BP was 99/73 and EKG was ordered.  Patient reported that she had not taken morning meds at that time.  Her medication list included isosorbide  mono, but she stated that she took that on prn basis.  Did not have any tablets with her.   10:25 EKG showed no evidence of MI (read by Dr. Burnard)  10:32 EMS arrived.

## 2023-09-12 ENCOUNTER — Telehealth: Payer: Self-pay | Admitting: *Deleted

## 2023-09-12 NOTE — Telephone Encounter (Signed)
Called left message for patient .  Requesting if patient could come an appt  on 09/13/23 at 11:40 am  Appointment  recently became available.   Left message for patient to call back to confirm the time slot.   RN placed patient on schedule.

## 2023-09-13 ENCOUNTER — Encounter: Payer: Self-pay | Admitting: Cardiology

## 2023-09-13 ENCOUNTER — Ambulatory Visit: Payer: Medicare Other | Attending: Cardiology | Admitting: Cardiology

## 2023-09-13 VITALS — BP 128/72 | HR 71 | Ht 66.0 in | Wt 218.6 lb

## 2023-09-13 DIAGNOSIS — I25119 Atherosclerotic heart disease of native coronary artery with unspecified angina pectoris: Secondary | ICD-10-CM | POA: Diagnosis not present

## 2023-09-13 DIAGNOSIS — Z955 Presence of coronary angioplasty implant and graft: Secondary | ICD-10-CM | POA: Diagnosis present

## 2023-09-13 DIAGNOSIS — Z0000 Encounter for general adult medical examination without abnormal findings: Secondary | ICD-10-CM | POA: Insufficient documentation

## 2023-09-13 DIAGNOSIS — R002 Palpitations: Secondary | ICD-10-CM | POA: Diagnosis present

## 2023-09-13 DIAGNOSIS — I10 Essential (primary) hypertension: Secondary | ICD-10-CM | POA: Insufficient documentation

## 2023-09-13 DIAGNOSIS — R001 Bradycardia, unspecified: Secondary | ICD-10-CM | POA: Diagnosis present

## 2023-09-13 DIAGNOSIS — R0609 Other forms of dyspnea: Secondary | ICD-10-CM | POA: Diagnosis present

## 2023-09-13 DIAGNOSIS — Z7902 Long term (current) use of antithrombotics/antiplatelets: Secondary | ICD-10-CM | POA: Insufficient documentation

## 2023-09-13 DIAGNOSIS — E785 Hyperlipidemia, unspecified: Secondary | ICD-10-CM | POA: Insufficient documentation

## 2023-09-13 DIAGNOSIS — R55 Syncope and collapse: Secondary | ICD-10-CM | POA: Diagnosis present

## 2023-09-13 MED ORDER — ISOSORBIDE MONONITRATE ER 60 MG PO TB24: 60.0000 mg | ORAL_TABLET | Freq: Every day | ORAL | 3 refills | Status: DC

## 2023-09-13 NOTE — Telephone Encounter (Signed)
Spoke to patient . She verbalized she received message and she would be at the appt  today

## 2023-09-13 NOTE — Patient Instructions (Addendum)
 7 medication Instructions:    Increase to Isosorbide mono ( Imdur) to 60 mg daily   *If you need a refill on your cardiac medications before your next appointment, please call your pharmacy*   Lab Work: Not needed If you have labs (blood work) drawn today and your tests are completely normal, you will receive your results only by: MyChart Message (if you have MyChart) OR A paper copy in the mail If you have any lab test that is abnormal or we need to change your treatment, we will call you to review the results.   Testing/Procedures:  Not needed  Follow-Up: At [facility], you and your health needs are our priority.  As part of our continuing mission to provide you with exceptional heart care, we have created designated Provider Care Teams.  These Care Teams include your primary Cardiologist (physician) and Advanced Practice Providers (APPs -  Physician Assistants and Nurse Practitioners) who all work together to provide you with the care you need, when you need it.     Your next appointment:   3 month(s)  The format for your next appointment:   In Person  Provider:   Azalee Course, PA-C    Then, Bryan Lemma, MD will plan to see you again in  7 to 8 month(s).

## 2023-09-13 NOTE — Telephone Encounter (Signed)
Called left message for patient to call back to confirm appt for today

## 2023-09-13 NOTE — Progress Notes (Signed)
 Cardiology Office Note:  .   Date:  09/14/2023  ID:  Carrie Reynolds, DOB [birth year], MRN [id number] PCP: Kaleen Mask, MD  [facility] Providers Cardiologist:  Bryan Lemma, MD     Chief Complaint  Patient presents with   Follow-up    Follow-up for chest tightness and dyspnea   Coronary Artery Disease    Episodes of chest tightness with shortness of breath.    Patient Profile: .     Carrie Reynolds is a moderately obese 68 y.o. female with a PMH notable for Coronary Artery Disease, Diastolic CHF, anxiety, OSA on CPAP (through [place]) who presents here for 109-month follow-up at the request of Kaleen Mask, *.    Carrie Reynolds was recently seen by Joni Reining, NP on June 13, 2023 with symptoms concerning for atypical chest pain and palpitations as well as exertional dyspnea.  Her PCP had just ordered a Zio patch monitor.  She noticed getting short of breath and dizzy with walking.  An allergist.  Usually when climbing stairs or more vigorous activity. => PFTs checked.  Subjective  Discussed the use of AI scribe software for clinical note transcription with the patient, who gave verbal consent to proceed.  History of Present Illness   Carrie Reynolds is a 68 year old female with coronary artery disease (LAD PCI), HTN & HLD who presents with episodes of chest tightness and shortness of breath.  She experiences episodes of chest tightness, difficulty breathing, and cramping sensations radiating to her chin, along with consistent pain between her shoulder blades. These episodes occur intermittently, with the most recent one happening last week while she was in her car at a grocery store. She describes the sensation as not painful but cramping, and she often feels like she is about to pass out.  She has a history of similar episodes occurring approximately once every other month, with previous episodes in December and October. During these  episodes, she typically uses nitroglycerin, which alleviates her symptoms within a few minutes. These episodes do not occur with exertion, such as walking upstairs, but rather when she is at rest or lying down. She experiences these symptoms both during the day and at night, sometimes waking up from sleep with shortness of breath.  In September, she experienced a more severe episode where she passed out after waking up unable to catch her breath. Her husband called emergency services, and she was given oxygen, which improved her condition. No heart palpitations, racing, or skipping, nor any swelling in her legs. She does not experience shortness of breath when initially lying down, but it can wake her up at night.  She has a history of coronary artery disease and has undergone a cardiac catheterization, which showed no significant coronary artery disease. She has also been evaluated for gastric issues, including bleeding ulcers, but currently has no active ulcers. Her blood pressure is well-controlled, and she monitors her oxygen levels at home, although she questions the accuracy of her device.  She is currently taking nitroglycerin as needed for her episodes and uses a smartwatch capable of monitoring her heart rhythm to track her symptoms.       Objective   Cardiac Medications: Current Meds  Medication Sig   clopidogrel (PLAVIX) 75 MG tablet TAKE 1 TABLET BY MOUTH EVERY DAY (Patient taking differently: Take 75 mg by mouth daily.)   famotidine (PEPCID) 40 MG tablet Take 1 tablet (40 mg total) by mouth 2 (two) times daily.  hydrochlorothiazide (HYDRODIURIL) 12.5 MG tablet Take 12.5 mg by mouth daily.   HYDROcodone-acetaminophen (NORCO/VICODIN) 5-325 MG tablet Take 1 tablet by mouth every 4 (four) hours as needed for moderate pain.   isosorbide mononitrate (IMDUR) 60 MG 24 hr tablet Take 1 tablet (60 mg total) by mouth daily.   losartan-hydrochlorothiazide (HYZAAR) 100-12.5 MG tablet Take 0.5  tablets by mouth daily. (Patient taking differently: Take 1 tablet by mouth daily.)   nitroGLYCERIN (NITROSTAT) 0.4 MG SL tablet PLACE 1 TABLET UNDER THE TONGUE EVERY 5 MINUTES AS NEEDED.   omeprazole (PRILOSEC) 40 MG capsule Take 1 capsule by mouth twice daily. May open capsule and place in applesauce or yogurt.   progesterone (PROMETRIUM) 100 MG capsule Take 100 mg by mouth daily.   propranolol (INDERAL) 10 MG tablet May take  1 to 2  tablets a day  as needed for palpiptation (Patient taking differently: Take 10 mg by mouth every 8 (eight) hours as needed (panic attack).)   rosuvastatin (CRESTOR) 40 MG tablet Take 40 mg by mouth daily.   Semaglutide-Weight Management (WEGOVY) 0.5 MG/0.5ML SOAJ Inject 0.5 mg into the skin once a week.    Studies Reviewed: [name]        Event monitor from PCP not available to review Myoview (02/2022): Negative treadmill portion of stress test.  Anterior and anterior septal reversible defect consistent with either ischemia or possible breast attenuation.  EF estimated 2%.  INTERMEDIATE RISK => based on cath report, would suggest breast attenuation. LHC (03/30/2022): Mid LAD lesion is 25% stenosed.;   Ramus lesion is 30% stenosed.  Non-stenotic Prox LAD to Mid LAD lesion with 0% stenosed side branch in 1st Sept was previously treated.   Non-stenotic Prox LAD lesion was previously treated. 1.  Widely patent proximal LAD stents with mild disease elsewhere.  2.  LVEDP of 10 mmHg.  Labs: Her PCP (via [facility]) 05/13/2023: TC 140, TG 212, HDL 47, LDL 53 (actually from December 2021).  TSH 2.08.  BNP 6.5; A1c 5.7 08/29/2023: Cr 1.29, K+ 3.6  Risk Assessment/Calculations:             Physical Exam:   VS:  BP 128/72   Pulse 71   Ht 5\' 6"  (1.676 m)   Wt 218 lb 9.6 oz (99.2 kg)   LMP 07/24/2003 (Within Months)   SpO2 98%   BMI 35.28 kg/m    Wt Readings from Last 3 Encounters:  09/13/23 218 lb 9.6 oz (99.2 kg)  06/13/23 225 lb 3.2 oz (102.2 kg)  04/17/23 200 lb (90.7 kg)     GEN: Well nourished, well developed in no acute distress; MODERATELY OBESE NECK: No JVD; No carotid bruits CARDIAC: Normal S1, S2; RRR, no murmurs, rubs, gallops RESPIRATORY:  Clear to auscultation without rales, wheezing or rhonchi ; nonlabored, good air movement. ABDOMEN: Soft, non-tender, non-distended EXTREMITIES:  No edema; No deformity     ASSESSMENT AND PLAN: .    Problem List Items Addressed This Visit       Cardiology Problems   Coronary artery disease involving native coronary artery of native heart with angina pectoris (HCC) - Primary (Chronic)   Recurrent episodes of chest tightness, shortness of breath, and discomfort between shoulder blades, relieved by nitroglycerin. Episodes are not exertional and may be related to coronary or esophageal spasm. No evidence of heart failure. -Increase Imdur dose to 60 mg daily -Continue losartan and HCTZ 50-6.25 mg daily (1/2 of 100-12.5 mg tab) for blood pressure and afterload reduction with  additional 12.5 mg HCTZ daily. -Continue clopidogrel 75 mg daily based on overlapping ostial LAD stents -Continue Crestor 40 mg daily. Monitor closely for recurrent symptoms, low threshold to consider Stress PET in light of false positive Myoview in 2023.      Relevant Medications   isosorbide mononitrate (IMDUR) 60 MG 24 hr tablet   Essential (primary) hypertension (Chronic)   Well-controlled blood pressure on current doses of losartan and HCTZ 50-6.25 mg daily (1/2 of a 100-12.5 mg tablet) plus additional 12.5 mg HCTZ. Has as needed propranolol which we may decide to make STEMI medication. -Continue current medications.       Relevant Medications   isosorbide mononitrate (IMDUR) 60 MG 24 hr tablet   Hyperlipidemia with target LDL less than 70 (Chronic)   Lipids well-controlled on recent check. Tolerating Crestor 40 mg daily. -Continue current dose of Crestor.      Relevant Medications   isosorbide mononitrate (IMDUR) 60 MG 24 hr tablet    Sinus bradycardia (Chronic)   Beta-blocker stopped because of low resting heart rate on propranolol. Low threshold to consider reinitiating low-dose propranolol for antianginal benefit.      Relevant Medications   isosorbide mononitrate (IMDUR) 60 MG 24 hr tablet     Other   DOE (dyspnea on exertion) (Chronic)   This is been a recurrent issue for her in the past and now seems to be going on again.  There is possible that there an ischemic component related for increase Imdur to 60 mg daily.  She has no other heart failure symptoms of PND, orthopnea or edema to suggest the need for an echocardiogram to assess reduced EF... - Continue to monitor, low threshold for ischemic evaluation      Healthcare maintenance   Encouraged to increase physical activity despite shortness of breath. -Continue to encourage physical activity. -Follow-up in September/October 2025.       Long term (current) use of antithrombotics/antiplatelets   Presence of 2 overlapping Drug Coated Stents in LAD coronary artery (Chronic)   2 overlapping DES stents of the LAD: Due to concerns of possible ISR, will maintain management with Plavix/prasugrel 75 mg daily. -Okay to hold Plavix 5 to 7 days preop for surgeries or procedures.      Rapid palpitations (Chronic)   PCP ordered Zio patch monitor, I cannot see the results. I would imagine nothing was seen, but she is also now having potential syncopal episodes. Low threshold to consider Zio patch monitoring the episodes recur.      Syncope and collapse   Episodes of syncope, possibly related to vasovagal response or bradycardia, as it does not seem related to tachycardia.   PCP did order Zio patch monitor, but there is no suggestion of any abnormal findings per patient report.  I do not have access to the report.  -Monitor for further episodes. If another episode occurs, consider referral for implantable loop recorder. -Stressed the importance of adequate  hydration and nutrition.          Follow-Up: Return in about 8 months (around 05/12/2024) for Routine follow up with me, [employer].  I spent 45 minutes in the care of Carrie Reynolds today including reviewing labs (2 min), reviewing studies (5 min reviewing prior Myoview & Cath-PCI results), face to face time discussing treatment options (25 minutes), reviewing records from previous clinic notes including my notes as well as notes from APP (4 minutes), 9 minutes dictating, and documenting in the encounter.  Signed, Marykay Lex, MD, MS Bryan Lemma, M.D., M.S. Interventional Cardiologist  [facility], [facility]  Pager # [phone] Phone # [phone] [address] [place], [place] [address]

## 2023-09-14 ENCOUNTER — Encounter: Payer: Self-pay | Admitting: Cardiology

## 2023-09-14 DIAGNOSIS — Z0000 Encounter for general adult medical examination without abnormal findings: Secondary | ICD-10-CM | POA: Insufficient documentation

## 2023-09-14 NOTE — Assessment & Plan Note (Addendum)
 Episodes of syncope, possibly related to vasovagal response or bradycardia, as it does not seem related to tachycardia.   PCP did order Zio patch monitor, but there is no suggestion of any abnormal findings per patient report.  I do not have access to the report.  -Monitor for further episodes. If another episode occurs, consider referral for implantable loop recorder. -Stressed the importance of adequate hydration and nutrition.

## 2023-09-14 NOTE — Assessment & Plan Note (Signed)
 Lipids well-controlled on recent check. Tolerating Crestor 40 mg daily. -Continue current dose of Crestor.

## 2023-09-14 NOTE — Assessment & Plan Note (Signed)
 PCP ordered Zio patch monitor, I cannot see the results. I would imagine nothing was seen, but she is also now having potential syncopal episodes. Low threshold to consider Zio patch monitoring the episodes recur.

## 2023-09-14 NOTE — Assessment & Plan Note (Signed)
 Beta-blocker stopped because of low resting heart rate on propranolol. Low threshold to consider reinitiating low-dose propranolol for antianginal benefit.

## 2023-09-14 NOTE — Assessment & Plan Note (Signed)
 Encouraged to increase physical activity despite shortness of breath. -Continue to encourage physical activity. -Follow-up in September/October 2025.

## 2023-09-14 NOTE — Assessment & Plan Note (Signed)
 This is been a recurrent issue for her in the past and now seems to be going on again.  There is possible that there an ischemic component related for increase Imdur to 60 mg daily.  She has no other heart failure symptoms of PND, orthopnea or edema to suggest the need for an echocardiogram to assess reduced EF... - Continue to monitor, low threshold for ischemic evaluation

## 2023-09-14 NOTE — Assessment & Plan Note (Signed)
 Well-controlled blood pressure on current doses of losartan and HCTZ 50-6.25 mg daily (1/2 of a 100-12.5 mg tablet) plus additional 12.5 mg HCTZ. Has as needed propranolol which we may decide to make STEMI medication. -Continue current medications.

## 2023-09-14 NOTE — Assessment & Plan Note (Addendum)
 Recurrent episodes of chest tightness, shortness of breath, and discomfort between shoulder blades, relieved by nitroglycerin. Episodes are not exertional and may be related to coronary or esophageal spasm. No evidence of heart failure. -Increase Imdur dose to 60 mg daily -Continue losartan and HCTZ 50-6.25 mg daily (1/2 of 100-12.5 mg tab) for blood pressure and afterload reduction with additional 12.5 mg HCTZ daily. -Continue clopidogrel 75 mg daily based on overlapping ostial LAD stents -Continue Crestor 40 mg daily. Monitor closely for recurrent symptoms, low threshold to consider Stress PET in light of false positive Myoview in 2023.

## 2023-09-14 NOTE — Assessment & Plan Note (Signed)
 2 overlapping DES stents of the LAD: Due to concerns of possible ISR, will maintain management with Plavix/prasugrel 75 mg daily. -Okay to hold Plavix 5 to 7 days preop for surgeries or procedures.

## 2023-10-08 ENCOUNTER — Telehealth: Payer: Self-pay | Admitting: Gastroenterology

## 2023-10-08 NOTE — Telephone Encounter (Signed)
 Spoke with the pt over the phone regarding pain that she believes is coming from gastric ulcers.  She states that she has developed back pain that radiated up to between the shoulder blades.  No abd pain, bleeding, nausea or other complaints.  She states she has seen cardiology and ruled out cardiac reasons.  She has not seen her PCP. She will call them and make appt as well.  She was last seen here in 2023. She states she is taking carafate about twice daily, prilosec once daily, and pepcid once daily.    Appt made for 10/29/23 at 130 pm with Hyacinth Meeker PA.  She will increase prilosec to twice daily and call back if her symptoms worsen. She also will be following up with PCP.

## 2023-10-08 NOTE — Telephone Encounter (Signed)
 Patient called would like to speak with a nurse, said she is experiencing pain due to ulcers.

## 2023-10-14 ENCOUNTER — Encounter: Payer: Self-pay | Admitting: Orthopedic Surgery

## 2023-10-14 ENCOUNTER — Other Ambulatory Visit (INDEPENDENT_AMBULATORY_CARE_PROVIDER_SITE_OTHER): Payer: Self-pay

## 2023-10-14 ENCOUNTER — Ambulatory Visit (INDEPENDENT_AMBULATORY_CARE_PROVIDER_SITE_OTHER): Admitting: Orthopedic Surgery

## 2023-10-14 DIAGNOSIS — G8929 Other chronic pain: Secondary | ICD-10-CM | POA: Diagnosis not present

## 2023-10-14 DIAGNOSIS — M25562 Pain in left knee: Secondary | ICD-10-CM

## 2023-10-14 DIAGNOSIS — M1712 Unilateral primary osteoarthritis, left knee: Secondary | ICD-10-CM

## 2023-10-14 DIAGNOSIS — M1711 Unilateral primary osteoarthritis, right knee: Secondary | ICD-10-CM | POA: Diagnosis not present

## 2023-10-14 MED ORDER — BUPIVACAINE HCL 0.25 % IJ SOLN
4.0000 mL | INTRAMUSCULAR | Status: AC | PRN
  Administered 2023-10-14: 4 mL via INTRA_ARTICULAR

## 2023-10-14 MED ORDER — METHYLPREDNISOLONE ACETATE 40 MG/ML IJ SUSP
40.0000 mg | INTRAMUSCULAR | Status: AC | PRN
  Administered 2023-10-14: 40 mg via INTRA_ARTICULAR

## 2023-10-14 MED ORDER — LIDOCAINE HCL 1 % IJ SOLN
5.0000 mL | INTRAMUSCULAR | Status: AC | PRN
  Administered 2023-10-14: 5 mL

## 2023-10-14 NOTE — Progress Notes (Signed)
 Office Visit Note   Patient: Carrie Reynolds           Date of Birth: [birth year]           MRN: [id number] Visit Date: 10/14/2023 Requested by: Kaleen Mask, MD [address] [place],  [place] [address] PCP: Kaleen Mask, MD  Subjective: Chief Complaint  Patient presents with   Left Knee - Pain    HPI: Carrie Reynolds is a 68 y.o. female who presents to the office reporting left knee pain.  Patient states she did fall down stairs in 2023.  Has had some swelling and mechanical symptoms in the left knee since that time.  Localizes pain primarily to the medial aspect of the knee.  Been going on worse over the last 3 months.  Standing up is painful.  Hard for her to walk a long distance.  She has tried lidocaine as well as Voltaren topically which has not been helpful..                ROS: All systems reviewed are negative as they relate to the chief complaint within the history of present illness.  Patient denies fevers or chills.  Assessment & Plan: Visit Diagnoses:  1. Chronic pain of left knee     Plan: Impression is left knee pain with possible medial meniscal pathology.  Does have some moderate wear and tear on the lateral joint space.  Aspiration and injection is performed today.  I did instruct Liborio Nixon on a home exercise program focusing on nonload bearing quad strengthening and stretching of the hamstring and quad muscles.  She will do this for at least 6 weeks.  We will communicate again and decide for or against MRI scanning of that left knee at that time to decide about further imaging.  Follow-Up Instructions: No follow-ups on file.   Orders:  Orders Placed This Encounter  Procedures   XR KNEE 3 VIEW LEFT   No orders of the defined types were placed in this encounter.     Procedures: Large Joint Inj: L knee on 10/14/2023 10:50 PM Indications: diagnostic evaluation, joint swelling and pain Details: 18 G 1.5 in needle, superolateral  approach  Arthrogram: No  Medications: 5 mL lidocaine 1 %; 40 mg methylPREDNISolone acetate 40 MG/ML; 4 mL bupivacaine 0.25 % Outcome: tolerated well, no immediate complications Procedure, treatment alternatives, risks and benefits explained, specific risks discussed. Consent was given by the patient. Immediately prior to procedure a time out was called to verify the correct patient, procedure, equipment, support staff and site/side marked as required. Patient was prepped and draped in the usual sterile fashion.       Clinical Data: No additional findings.  Objective: Vital Signs: LMP 07/24/2003 (Within Months)   Physical Exam:  Constitutional: Patient appears well-developed HEENT:  Head: Normocephalic Eyes:EOM are normal Neck: Normal range of motion Cardiovascular: Normal rate Pulmonary/chest: Effort normal Neurologic: Patient is alert Skin: Skin is warm Psychiatric: Patient has normal mood and affect  Ortho Exam: Ortho exam demonstrates slightly antalgic gait to the left.  No nerve root tension signs.  Pedal pulses palpable.  Does have trace effusion in the left knee no effusion right knee.  Collateral cruciate ligaments are stable.  No groin pain with internal/external Tatian of the leg.  No other masses lymphadenopathy or skin changes noted in that left knee region.  Specialty Comments:  Electrodiagnostic study of the right upper limb:  IMPRESSION:   Nerve conduction  studies done on both upper extremities were within normal limits. No evidence of a neuropathy is seen. EMG evaluation of the right upper extremity is unremarkable, no evidence of an overlying cervical radiculopathy is noted.   Marlan Palau MD 04/06/2015 1:41 PM   [facility]  Imaging: XR KNEE 3 VIEW LEFT Result Date: 10/14/2023 AP lateral merchant radiographs left knee reviewed.  Moderate to severe arthritis is present in the lateral compartment with spurring on both the femoral and  tibial side.  Alignment intact.  Mild arthritis present in the medial and patellofemoral compartments.  No acute fracture.    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   Ulcer of small intestine 09/06/2021   Acute blood loss anemia 09/06/2021   Iron deficiency 09/06/2021   Colon cancer screening 09/06/2021   Long term (current) use of antithrombotics/antiplatelets 09/06/2021   Jejunal ulcer 08/21/2021   Syncope and collapse 07/15/2021   Sinus bradycardia 11/03/2019   Atypical angina (HCC) 10/12/2019   Fatigue 10/12/2019   Presence of 2 overlapping Drug Coated Stents in LAD coronary artery    Coronary artery disease involving native coronary artery of native heart with angina pectoris (HCC) 06/17/2019   DOE (dyspnea on exertion) 06/16/2019   CAD S/P DES PCI-proximal LAD 05/20/2019   Agatston coronary artery calcium score greater than 400 04/16/2019   Migraine with vertigo 07/29/2018   Tinnitus of both ears 07/29/2018   Rapid palpitations 04/16/2017   Systolic murmur 04/16/2017   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   OSA on CPAP 11/19/2013   Hyperlipidemia with target LDL less than 70 07/29/2013   Obesity (BMI 30.0-34.9) 04/25/2012   Depression with anxiety 04/25/2012   Environmental allergies 04/25/2012   Essential (primary) hypertension 04/25/2012   Chiari malformation type I (HCC) 01/03/2012   Postmenopausal 09/18/2011   Past Medical History:  Diagnosis Date   Anxiety    Arnold-Chiari malformation (HCC) 2006   CAD S/P PCI 05/06/2019   05/06/2019: prox LAD (@ SPI) PCI with Resolute Onyx DES 3.5  x 8, Residual 60% PDA, Normal LVF; 07/02/2019: relook Cath for  Anterior Ischemia on Mhoview --> CATH 70% pre-stent/proximal edge dissection --> proximal overlapping DES (Resolute Onyx 3.5 x 8 - new & old stent post-dilated to 3.8 mm)   Cervical strain    syndrome   Cervicogenic headache    Chronic kidney disease    Concussion with loss of consciousness 10/09/2017   CTS (carpal tunnel syndrome)    Depression    GERD (gastroesophageal reflux disease)    Glucose intolerance (impaired glucose tolerance)    High coronary artery calcium score    Score of 1460.  Coronary CTA shows high-grade proximal-mid LAD stenosis (CT FFR 0.50).  Also PDA/PLA 60 to 70% stenosis (CTO FFR 0.75)   History of kidney stones    Hypertension    Migraine headache    with visual aura   Obesity    OSA (obstructive sleep apnea)    not treated   Postmenopausal    Sleep apnea    Vertigo    post concussive    Family History  Problem Relation Age of Onset   Hypertension Mother    Lung cancer Father    Alzheimer's  disease Father    Migraines Father    Hyperlipidemia Sister    Hypertension Sister    Colon cancer Maternal Grandmother    Heart failure Maternal Grandfather    Alzheimer's disease Paternal Grandmother    Lung cancer Paternal Grandfather    Esophageal cancer Neg Hx    Inflammatory bowel disease Neg Hx    Liver disease Neg Hx    Pancreatic cancer Neg Hx    Rectal cancer Neg Hx    Stomach cancer Neg Hx     Past Surgical History:  Procedure Laterality Date   BIOPSY  07/15/2021   Procedure: BIOPSY;  Surgeon: Mansouraty, Netty Starring., MD;  Location: [facility] ENDOSCOPY;  Service: Gastroenterology;;   BUNIONECTOMY     right great toe   CHOLECYSTECTOMY N/A 06/05/2018   Procedure: LAPAROSCOPIC CHOLECYSTECTOMY WITH POSSIBLE INTRAOPERATIVE CHOLANGIOGRAM;  Surgeon: Harriette Bouillon, MD;  Location: [facility] OR;  Service: General;  Laterality: N/A;   COLOSTOMY     CORONARY STENT INTERVENTION N/A 05/12/2019   Procedure: CORONARY STENT INTERVENTION;  Surgeon: Marykay Lex, MD;   Location: [facility] INVASIVE CV LAB;  Service: Cardiovascular;;; Culprit-p-mLAD 95% 950% SP1) --> Scoring Balloon PTCA -> DES PCI (Resolute DES 3.5 x 38 - 3.8 mm; 0% LAD& 55% SP1). -->  SP1 was somewhat jailed with TIMI II flow post PCI.   CORONARY STENT INTERVENTION N/A 07/02/2019   Procedure: CORONARY STENT INTERVENTION;  Surgeon: Marykay Lex, MD;  Location: [facility] INVASIVE CV LAB;  Service: Cardiovascular;; * 70% Proximal stent edge dissection (edge of previous stent that is patent) -->  successfully covered with additional RESOLUTE ONYX DES 3.5 mm x 8 mm overlapping proximal edge of previous stent (postdilated to 3.8 mm)   ENTEROSCOPY N/A 07/15/2021   Procedure: ENTEROSCOPY;  Surgeon: Meridee Score Netty Starring., MD;  Location: [facility] ENDOSCOPY;  Service: Gastroenterology;  Laterality: N/A;   GASTRIC BYPASS  11/2015   [facility]   GYNECOLOGIC CRYOSURGERY     HAND SURGERY     LEFT HEART CATH AND CORONARY ANGIOGRAPHY N/A 05/12/2019   Procedure: LEFT HEART CATH AND CORONARY ANGIOGRAPHY;  Surgeon: Marykay Lex, MD;  Location[facility] INVASIVE CV LAB;  Service: Cardiovascular;;; Culprit-p-mLAD 95% 950% SP1) --> DES PCI.;  RPAV 60% -small caliber vessel, did not appear flow-limiting (medical management).  EF 55 to 60%.  Normal LVEDP.   LEFT HEART CATH AND CORONARY ANGIOGRAPHY N/A 07/02/2019   Procedure: LEFT HEART CATH AND CORONARY ANGIOGRAPHY;  Surgeon: Marykay Lex, MD;  Location: [facility] INVASIVE CV LAB;  Service: Cardiovascular;; CULPRIT = ~70% proximal edge dissection of previous stent (DES PCI overlapping stent placed) - jails SP1 w/ ~60-70% ostial stenosis.[name] mLAD 25%. RI 30%, RPAV 60%.   LEFT HEART CATH AND CORONARY ANGIOGRAPHY N/A 03/30/2022   Procedure: LEFT HEART CATH AND CORONARY ANGIOGRAPHY;  Surgeon: Orbie Pyo, MD;  Location: [facility] INVASIVE CV LAB;  Service: Cardiovascular;  Laterality: N/A;   NM MYOVIEW LTD  06/24/2019   Eugenie Birks): HIGH RISK.  EF 55%.  Large size, moderate severe defect in the anterior wall  with associated anterior hypokinesis.  Suspect LAD territory.   NM MYOVIEW LTD  10/23/2019   Normal EF 55 to 60%.  No EKG changes.  Small size mild severity fixed defect in the apical wall consistent with breast attenuation versus small prior infarct.  Marked improvement from anterior apical perfusion abnormality seen in February 2020.   TRANSTHORACIC ECHOCARDIOGRAM  12/08/2020   Normal LV Fxn - EF 60-65%. No RWMA. Mod Asymmetric  Basal Septa LVH with mild Concentric LVH. Gr II DD. Normal Longitudinal Strain. Normal RV size & fxn - normal RAP/CVP.  Normal MV. Mild AoV sclerosis w/ stenosis. Mild Ascending Ao dilation - ~38 mm (borderline).  => No change from 04/2017.   TUBAL LIGATION Bilateral    UPPER GASTROINTESTINAL ENDOSCOPY     VAGINAL HYSTERECTOMY  2003   with LSO and right salpingectomy; right ovary still present   [facility] MONITOR-14-day  01/2021   Predominant rhythm sinus-heart rate range 40 to 124 bpm.  Average 70 bpm.  Rare PVCs and isolated PACs noted.  PAC couplets and triplets also noted as well as trigeminy.  3 atrial runs of 4-5 beats ranging from 102 to 150 bpm.  (2 of 3 noted on patient trigger.  Remainder patient triggers were sinus rhythm with PACs or PVCs); no atrial fibrillation or other sustained arrhythmias noted.   Social History   Occupational History   Occupation: Retired Event organiser: [employer]    Comment: Logistics, warehouse  Tobacco Use   Smoking status: Former    Current packs/day: 0.00    Average packs/day: 0.1 packs/day for 30.0 years (3.0 ttl pk-yrs)    Types: Cigarettes    Start date: 04/26/1982    Quit date: 04/26/2012    Years since quitting: 11.4   Smokeless tobacco: Never  Vaping Use   Vaping status: Never Used  Substance and Sexual Activity   Alcohol use: Yes    Comment: occasional   Drug use: Yes    Types: Marijuana    Comment: weed juice   Sexual activity: Yes    Partners: Male    Birth control/protection: Surgical     Comment: hysterectomy

## 2023-10-29 ENCOUNTER — Ambulatory Visit: Admitting: Physician Assistant

## 2023-10-30 ENCOUNTER — Ambulatory Visit: Payer: Medicare Other | Admitting: Cardiology

## 2023-10-31 ENCOUNTER — Encounter: Payer: Self-pay | Admitting: Orthopedic Surgery

## 2023-10-31 DIAGNOSIS — G8929 Other chronic pain: Secondary | ICD-10-CM

## 2023-11-05 ENCOUNTER — Encounter: Payer: Self-pay | Admitting: Orthopedic Surgery

## 2023-11-06 ENCOUNTER — Ambulatory Visit
Admission: RE | Admit: 2023-11-06 | Discharge: 2023-11-06 | Disposition: A | Discharge status: Home / Self Care | Source: Ambulatory Visit | Attending: Orthopedic Surgery | Admitting: Orthopedic Surgery

## 2023-11-06 DIAGNOSIS — G8929 Other chronic pain: Secondary | ICD-10-CM

## 2023-11-18 ENCOUNTER — Ambulatory Visit (INDEPENDENT_AMBULATORY_CARE_PROVIDER_SITE_OTHER): Admitting: Orthopedic Surgery

## 2023-11-18 DIAGNOSIS — M25562 Pain in left knee: Secondary | ICD-10-CM

## 2023-11-18 DIAGNOSIS — M1712 Unilateral primary osteoarthritis, left knee: Secondary | ICD-10-CM | POA: Diagnosis not present

## 2023-11-18 DIAGNOSIS — G8929 Other chronic pain: Secondary | ICD-10-CM | POA: Diagnosis not present

## 2023-11-19 ENCOUNTER — Telehealth: Payer: Self-pay

## 2023-11-19 ENCOUNTER — Encounter: Payer: Self-pay | Admitting: Orthopedic Surgery

## 2023-11-19 NOTE — Telephone Encounter (Signed)
 Auth needed for bil knee gel

## 2023-11-19 NOTE — Progress Notes (Signed)
 Office Visit Note   Patient: Carrie Reynolds           Date of Birth: Dec 29, 1955           MRN: [id number] Visit Date: 11/18/2023 Requested by: Carrie Reynolds [address] [place],  [place] [address] PCP: Carrie Reynolds  Subjective: Chief Complaint  Patient presents with   Left Knee - Pain, Follow-up    HPI: Carrie Reynolds is a 68 y.o. female who presents to the office reporting left knee pain.  Patient had left knee injection 10/14/2023.  She also had an MRI scan of the left knee.  She is reporting primarily medial sided pain.  The left knee MRI scan shows normal medial side but with chronic anterior horn lateral meniscal tear with progressive arthritis on that side.  Patellofemoral arthritis also present..                ROS: All systems reviewed are negative as they relate to the chief complaint within the history of present illness.  Patient denies fevers or chills.  Assessment & Plan: Visit Diagnoses:  1. Chronic pain of left knee   2. Primary osteoarthritis of left knee     Plan: Impression symptomatic left knee pain.  Mostly on the medial side where the MRI scan is normal.  She does have progressive lateral compartment arthritis.  Arthroscopic intervention not indicated at this time.  We will try gel shots.  Want to hold off on knee replacement as long as possible. This patient is diagnosed with osteoarthritis of the knee(s).    Radiographs show evidence of joint space narrowing, osteophytes, subchondral sclerosis and/or subchondral cysts.  This patient has knee pain which interferes with functional and activities of daily living.    This patient has experienced inadequate response, adverse effects and/or intolerance with conservative treatments such as acetaminophen , NSAIDS, topical creams, physical therapy or regular exercise, knee bracing and/or weight loss.   This patient has experienced inadequate response or has a contraindication to  intra articular steroid injections for at least 3 months.   This patient is not scheduled to have a total knee replacement within 6 months of starting treatment with viscosupplementation.   Follow-Up Instructions: No follow-ups on file.   Orders:  No orders of the defined types were placed in this encounter.  No orders of the defined types were placed in this encounter.     Procedures: No procedures performed   Clinical Data: No additional findings.  Objective: Vital Signs: LMP 07/24/2003 (Within Months)   Physical Exam:  Constitutional: Patient appears well-developed HEENT:  Head: Normocephalic Eyes:EOM are normal Neck: Normal range of motion Cardiovascular: Normal rate Pulmonary/chest: Effort normal Neurologic: Patient is alert Skin: Skin is warm Psychiatric: Patient has normal mood and affect  Ortho Exam: Ortho exam demonstrates full active and passive range of motion of both knees.  No effusion in either knee.  Mild medial joint line tenderness on the left compared to the right.  Ankle dorsiflexion plantarflexion strength is intact.  Collateral cruciate ligaments are stable on that left-hand side.  Specialty Comments:  Electrodiagnostic study of the right upper limb:  IMPRESSION:   Nerve conduction studies done on both upper extremities were within normal limits. No evidence of a neuropathy is seen. EMG evaluation of the right upper extremity is unremarkable, no evidence of an overlying cervical radiculopathy is noted.   Carrie Reynolds 04/06/2015 1:41 PM   [facility]  Imaging: No results found.   PMFS History: Patient Active Problem List   Diagnosis Date Noted   Healthcare maintenance 09/14/2023   Recurrent major depressive episodes, moderate (HCC) 02/07/2023   Migraine with aura, not intractable, without status migrainosus 02/07/2023   Post-traumatic stress disorder, chronic 02/07/2023   Bloating symptom 09/08/2021    Gastrojejunal ulceration 09/06/2021   Ulcer of small intestine 09/06/2021   Acute blood loss anemia 09/06/2021   Iron deficiency 09/06/2021   Colon cancer screening 09/06/2021   Long term (current) use of antithrombotics/antiplatelets 09/06/2021   Jejunal ulcer 08/21/2021   Syncope and collapse 07/15/2021   Sinus bradycardia 11/03/2019   Atypical angina (HCC) 10/12/2019   Fatigue 10/12/2019   Presence of 2 overlapping Drug Coated Stents in LAD coronary artery    Coronary artery disease involving native coronary artery of native heart with angina pectoris (HCC) 06/17/2019   DOE (dyspnea on exertion) 06/16/2019   CAD S/P DES PCI-proximal LAD 05/20/2019   Agatston coronary artery calcium  score greater than 400 04/16/2019   Migraine with vertigo 07/29/2018   Tinnitus of both ears 07/29/2018   Rapid palpitations 04/16/2017   Systolic murmur 04/16/2017   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   OSA on CPAP 11/19/2013   Hyperlipidemia with target LDL less than 70 07/29/2013   Obesity (BMI 30.0-34.9) 04/25/2012   Depression with anxiety 04/25/2012   Environmental allergies 04/25/2012   Essential (primary) hypertension 04/25/2012   Chiari malformation type I (HCC) 01/03/2012   Postmenopausal 09/18/2011   Past Medical History:  Diagnosis Date   Anxiety    Arnold-Chiari malformation (HCC) 2006   CAD S/P PCI 05/06/2019   05/06/2019: prox LAD (@ SPI) PCI with Resolute Onyx DES 3.5  x 8, Residual 60% PDA, Normal LVF; 07/02/2019: relook Cath for Anterior Ischemia on Mhoview --> CATH 70% pre-stent/proximal edge dissection --> proximal overlapping DES (Resolute Onyx 3.5 x 8 - new & old stent post-dilated to 3.8 mm)   Cervical strain    syndrome   Cervicogenic headache    Chronic kidney disease    Concussion with loss of consciousness 10/09/2017   CTS (carpal tunnel syndrome)    Depression    GERD (gastroesophageal reflux disease)    Glucose intolerance (impaired glucose tolerance)    High coronary  artery calcium  score    Score of 1460.  Coronary CTA shows high-grade proximal-mid LAD stenosis (CT FFR 0.50).  Also PDA/PLA 60 to 70% stenosis (CTO FFR 0.75)   History of kidney stones    Hypertension    Migraine headache    with visual aura   Obesity    OSA (obstructive sleep apnea)    not treated   Postmenopausal    Sleep apnea    Vertigo    post concussive    Family History  Problem Relation Age of Onset   Hypertension Mother    Lung cancer Father    Alzheimer's disease Father    Migraines Father    Hyperlipidemia Sister    Hypertension Sister    Colon cancer Maternal Grandmother    Heart failure Maternal Grandfather    Alzheimer's disease Paternal Grandmother    Lung cancer Paternal Grandfather    Esophageal cancer Neg Hx    Inflammatory bowel disease Neg Hx    Liver disease Neg Hx    Pancreatic cancer Neg Hx    Rectal cancer Neg Hx    Stomach cancer Neg Hx     Past Surgical History:  Procedure Laterality Date  BIOPSY  07/15/2021   Procedure: BIOPSY;  Surgeon: Brice Campi Albino Alu., Reynolds;  Location: [facility] ENDOSCOPY;  Service: Gastroenterology;;   BUNIONECTOMY     right great toe   CHOLECYSTECTOMY N/A 06/05/2018   Procedure: LAPAROSCOPIC CHOLECYSTECTOMY WITH POSSIBLE INTRAOPERATIVE CHOLANGIOGRAM;  Surgeon: Sim Dryer, Reynolds;  Location: [facility] OR;  Service: General;  Laterality: N/A;   COLOSTOMY     CORONARY STENT INTERVENTION N/A 05/12/2019   Procedure: CORONARY STENT INTERVENTION;  Surgeon: Arleen Lacer, Reynolds;  Location: [facility] INVASIVE CV LAB;  Service: Cardiovascular;;; Culprit-p-mLAD 95% 950% SP1) --> Scoring Balloon PTCA -> DES PCI (Resolute DES 3.5 x 38 - 3.8 mm; 0% LAD& 55% SP1). -->  SP1 was somewhat jailed with TIMI II flow post PCI.   CORONARY STENT INTERVENTION N/A 07/02/2019   Procedure: CORONARY STENT INTERVENTION;  Surgeon: Arleen Lacer, Reynolds;  Location: [facility] INVASIVE CV LAB;  Service: Cardiovascular;; * 70% Proximal stent edge dissection (edge of previous  stent that is patent) -->  successfully covered with additional RESOLUTE ONYX DES 3.5 mm x 8 mm overlapping proximal edge of previous stent (postdilated to 3.8 mm)   ENTEROSCOPY N/A 07/15/2021   Procedure: ENTEROSCOPY;  Surgeon: Brice Campi Albino Alu., Reynolds;  Location: [facility] ENDOSCOPY;  Service: Gastroenterology;  Laterality: N/A;   GASTRIC BYPASS  11/2015   [facility]   GYNECOLOGIC CRYOSURGERY     HAND SURGERY     LEFT HEART CATH AND CORONARY ANGIOGRAPHY N/A 05/12/2019   Procedure: LEFT HEART CATH AND CORONARY ANGIOGRAPHY;  Surgeon: Arleen Lacer, Reynolds;  Location: [facility] INVASIVE CV LAB;  Service: Cardiovascular;;; Culprit-p-mLAD 95% 950% SP1) --> DES PCI.;  RPAV 60% -small caliber vessel, did not appear flow-limiting (medical management).  EF 55 to 60%.  Normal LVEDP.   LEFT HEART CATH AND CORONARY ANGIOGRAPHY N/A 07/02/2019   Procedure: LEFT HEART CATH AND CORONARY ANGIOGRAPHY;  Surgeon: Arleen Lacer, Reynolds;  Location: [facility] INVASIVE CV LAB;  Service: Cardiovascular;; CULPRIT = ~70% proximal edge dissection of previous stent (DES PCI overlapping stent placed) - jails SP1 w/ ~60-70% ostial stenosis.Aaron Aas mLAD 25%. RI 30%, RPAV 60%.   LEFT HEART CATH AND CORONARY ANGIOGRAPHY N/A 03/30/2022   Procedure: LEFT HEART CATH AND CORONARY ANGIOGRAPHY;  Surgeon: Kyra Phy, Reynolds;  Location: [facility] INVASIVE CV LAB;  Service: Cardiovascular;  Laterality: N/A;   NM MYOVIEW  LTD  06/24/2019   (Lexiscan ): HIGH RISK.  EF 55%.  Large size, moderate severe defect in the anterior wall with associated anterior hypokinesis.  Suspect LAD territory.   NM MYOVIEW  LTD  10/23/2019   Normal EF 55 to 60%.  No EKG changes.  Small size mild severity fixed defect in the apical wall consistent with breast attenuation versus small prior infarct.  Marked improvement from anterior apical perfusion abnormality seen in February 2020.   TRANSTHORACIC ECHOCARDIOGRAM  12/08/2020   Normal LV Fxn - EF 60-65%. No RWMA. Mod Asymmetric Basal Septa LVH with mild  Concentric LVH. Gr II DD. Normal Longitudinal Strain. Normal RV size & fxn - normal RAP/CVP.  Normal MV. Mild AoV sclerosis w/ stenosis. Mild Ascending Ao dilation - ~38 mm (borderline).  => No change from 04/2017.   TUBAL LIGATION Bilateral    UPPER GASTROINTESTINAL ENDOSCOPY     VAGINAL HYSTERECTOMY  2003   with LSO and right salpingectomy; right ovary still present   [facility] MONITOR-14-day  01/2021   Predominant rhythm sinus-heart rate range 40 to 124 bpm.  Average 70 bpm.  Rare PVCs and isolated PACs  noted.  PAC couplets and triplets also noted as well as trigeminy.  3 atrial runs of 4-5 beats ranging from 102 to 150 bpm.  (2 of 3 noted on patient trigger.  Remainder patient triggers were sinus rhythm with PACs or PVCs); no atrial fibrillation or other sustained arrhythmias noted.   Social History   Occupational History   Occupation: Retired Event organiser: [employer]    Comment: Logistics, warehouse  Tobacco Use   Smoking status: Former    Current packs/day: 0.00    Average packs/day: 0.1 packs/day for 30.0 years (3.0 ttl pk-yrs)    Types: Cigarettes    Start date: 04/26/1982    Quit date: 04/26/2012    Years since quitting: 11.5   Smokeless tobacco: Never  Vaping Use   Vaping status: Never Used  Substance and Sexual Activity   Alcohol use: Yes    Comment: occasional   Drug use: Yes    Types: Marijuana    Comment: weed juice   Sexual activity: Yes    Partners: Male    Birth control/protection: Surgical    Comment: hysterectomy

## 2023-12-04 NOTE — Telephone Encounter (Signed)
 VOB submitted for Monovisc, bilateral knee

## 2023-12-09 ENCOUNTER — Telehealth: Payer: Self-pay

## 2023-12-09 ENCOUNTER — Ambulatory Visit: Admitting: Orthopedic Surgery

## 2023-12-09 ENCOUNTER — Encounter: Payer: Self-pay | Admitting: Orthopedic Surgery

## 2023-12-09 DIAGNOSIS — M1712 Unilateral primary osteoarthritis, left knee: Secondary | ICD-10-CM

## 2023-12-09 DIAGNOSIS — M170 Bilateral primary osteoarthritis of knee: Secondary | ICD-10-CM | POA: Diagnosis not present

## 2023-12-09 DIAGNOSIS — M1711 Unilateral primary osteoarthritis, right knee: Secondary | ICD-10-CM

## 2023-12-09 NOTE — Telephone Encounter (Signed)
 Please precert for gel injections bilaterally for 6 months. Just had gel injections today with Dr.Dean. Thanks

## 2023-12-10 ENCOUNTER — Encounter: Payer: Self-pay | Admitting: Orthopedic Surgery

## 2023-12-10 MED ORDER — HYALURONAN 88 MG/4ML IX SOSY
88.0000 mg | PREFILLED_SYRINGE | INTRA_ARTICULAR | Status: AC | PRN
  Administered 2023-12-09: 88 mg via INTRA_ARTICULAR

## 2023-12-10 MED ORDER — LIDOCAINE HCL 1 % IJ SOLN
5.0000 mL | INTRAMUSCULAR | Status: AC | PRN
  Administered 2023-12-09: 5 mL

## 2023-12-10 NOTE — Progress Notes (Signed)
   Procedure Note  Patient: Carrie Reynolds             Date of Birth: 12/19/1955           MRN: [id number]             Visit Date: 12/09/2023  Procedures: Visit Diagnoses:  1. Primary osteoarthritis of left knee   2. Unilateral primary osteoarthritis, right knee     Large Joint Inj: R knee on 12/09/2023 11:54 AM Indications: pain, joint swelling and diagnostic evaluation Details: 18 G 1.5 in needle, superolateral approach  Arthrogram: No  Medications: 5 mL lidocaine  1 %; 88 mg Hyaluronan 88 MG/4ML Outcome: tolerated well, no immediate complications Procedure, treatment alternatives, risks and benefits explained, specific risks discussed. Consent was given by the patient. Immediately prior to procedure a time out was called to verify the correct patient, procedure, equipment, support staff and site/side marked as required. Patient was prepped and draped in the usual sterile fashion.    Large Joint Inj: L knee on 12/09/2023 11:54 AM Indications: pain, joint swelling and diagnostic evaluation Details: 18 G 1.5 in needle, superolateral approach  Arthrogram: No  Medications: 5 mL lidocaine  1 %; 88 mg Hyaluronan 88 MG/4ML Outcome: tolerated well, no immediate complications Procedure, treatment alternatives, risks and benefits explained, specific risks discussed. Consent was given by the patient. Immediately prior to procedure a time out was called to verify the correct patient, procedure, equipment, support staff and site/side marked as required. Patient was prepped and draped in the usual sterile fashion.     Lot #[id number] This patient is diagnosed with osteoarthritis of the knee(s).    Radiographs show evidence of joint space narrowing, osteophytes, subchondral sclerosis and/or subchondral cysts.  This patient has knee pain which interferes with functional and activities of daily living.    This patient has experienced inadequate response, adverse effects and/or intolerance with  conservative treatments such as acetaminophen , NSAIDS, topical creams, physical therapy or regular exercise, knee bracing and/or weight loss.   This patient has experienced inadequate response or has a contraindication to intra articular steroid injections for at least 3 months.   This patient is not scheduled to have a total knee replacement within 6 months of starting treatment with viscosupplementation.

## 2023-12-11 ENCOUNTER — Other Ambulatory Visit: Payer: Self-pay

## 2023-12-11 DIAGNOSIS — M1712 Unilateral primary osteoarthritis, left knee: Secondary | ICD-10-CM

## 2023-12-11 DIAGNOSIS — M1711 Unilateral primary osteoarthritis, right knee: Secondary | ICD-10-CM

## 2023-12-18 ENCOUNTER — Encounter: Payer: Self-pay | Admitting: Physician Assistant

## 2023-12-18 ENCOUNTER — Ambulatory Visit: Payer: Medicare Other | Attending: Physician Assistant | Admitting: Physician Assistant

## 2023-12-18 VITALS — BP 120/84 | HR 70 | Ht 66.0 in | Wt 214.0 lb

## 2023-12-18 DIAGNOSIS — E785 Hyperlipidemia, unspecified: Secondary | ICD-10-CM | POA: Insufficient documentation

## 2023-12-18 DIAGNOSIS — Z01810 Encounter for preprocedural cardiovascular examination: Secondary | ICD-10-CM | POA: Diagnosis not present

## 2023-12-18 DIAGNOSIS — I10 Essential (primary) hypertension: Secondary | ICD-10-CM | POA: Insufficient documentation

## 2023-12-18 DIAGNOSIS — I2510 Atherosclerotic heart disease of native coronary artery without angina pectoris: Secondary | ICD-10-CM | POA: Diagnosis present

## 2023-12-18 MED ORDER — NITROGLYCERIN 0.4 MG SL SUBL: 0.4000 mg | SUBLINGUAL_TABLET | SUBLINGUAL | 2 refills | Status: AC | PRN

## 2023-12-18 MED ORDER — ISOSORBIDE MONONITRATE ER 60 MG PO TB24: 60.0000 mg | ORAL_TABLET | Freq: Every day | ORAL | 3 refills | Status: DC

## 2023-12-18 NOTE — Progress Notes (Unsigned)
 Cardiology Office Note:  .   Date:  12/18/2023  ID:  Carrie Reynolds, DOB October 19, 1955, MRN [id number] PCP: Candiss Chamorro, MD  [facility] Providers Cardiologist:  Randene Bustard, MD { Click to update primary MD,subspecialty MD or APP then REFRESH:1}   History of Present Illness: .   Carrie Reynolds is a 68 y.o. female with a hx of CAD s/p DESx 2 to LAD, sinus bradycardia, hypertension, hyperlipidemia, and OSA.  Cardiac catheterization performed on 05/12/2019 following abnormal coronary CT revealed 95% proximal to mid LAD lesion treated with Resolute Onyx 3.5 x 8 mm DES, 50% lesion in the sidebranch in first septal perforator.  Due to recurrent chest pain and abnormal Myoview , patient underwent cardiac catheterization again on 07/02/2019 that revealed EF 55 to 65%, widely patent proximal to mid LAD stent with 70% stenosis proximal to the stent edge due to likely dissection, this was treated with Resolute Onyx 3.5 x 8 mm DES, 25% mid LAD lesion, 30% ramus lesion, 60% RPAV lesion.  She had a nuclear stress test in April 2021 and Apr 2022 [facility]) which were nonischemic.  She is no longer on beta-blocker due to sinus bradycardia.  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  Patient was admitted at [facility] due to arm pain and a near syncope in April 2022, Myoview  was nonischemic.  The incidence was felt to be due to dehydration.  The diltiazem  stopped as her heart rate was in the 40s.  Echocardiogram performed on 12/08/2020 showed EF 60 to 65%, grade 2 DD, trivial MR. Due to abnormal Myoview , patient underwent repeat cardiac catheterization on 04/06/2022 which showed minimal residual CAD, patent stent in the LAD territory.  EF was normal on Myoview .  Patient was last seen by Dr. Addie Holstein in February 2025 at which time she described recurrent episode of chest tightness, shortness of breath and discomfort between her shoulder blade relieved by  nitroglycerin .  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  Imdur  was increased to 60 mg daily.  She had actually had a's episode of syncope in September 20 24-1 the episode.  Dr. Addie Holstein suspected may be related to a vasovagal response or bradycardia.  Dr. Addie Holstein recommended continue observation and consider implantable loop recorder if she has another passing out spell.  Patient presents today for follow-up.  She has not had any dizziness or feeling of passing out.  She has been diagnosed with a hiatal hernia that may have been contributing to some of her previous chest discomfort. Her previous chest discomfort was quite atypical and it never occurred with physical activity.  She denies any exertional symptom.  She does have bilateral knee issue that has been worse for the past 2 weeks.  She has upcoming hiatal hernia surgery in [place] .  From the cardiac perspective, she is at acceptable risk to proceed without further workup.  She is intolerant of aspirin .  If needed, she may hold Plavix  for 7 days prior to the procedure and restart as soon as possible afterward at the surgeon's discretion.  She should follow-up with Dr. Addie Holstein in 6 months.  ROS: ***  Studies Reviewed: .        *** Risk Assessment/Calculations:   {Does this patient have ATRIAL FIBRILLATION?:[phone]}         Physical Exam:   VS:  BP 120/84 (BP Location: Left Arm, Patient Position: Sitting,  Cuff Size: Large)   Pulse 70   Ht 5\' 6"  (1.676 m)   Wt 214 lb (97.1 kg)   LMP 07/24/2003 (Within Months)   SpO2 97%   BMI 34.54 kg/m    Wt Readings from Last 3 Encounters:  12/18/23 214 lb (97.1 kg)  09/13/23 218 lb 9.6 oz (99.2 kg)  06/13/23 225 lb 3.2 oz (102.2 kg)    GEN: Well nourished, well developed in no acute distress NECK: No JVD; No carotid bruits CARDIAC: ***RRR, no murmurs, rubs, gallops RESPIRATORY:  Clear to auscultation without rales, wheezing or  rhonchi  ABDOMEN: Soft, non-tender, non-distended EXTREMITIES:  No edema; No deformity   ASSESSMENT AND PLAN: .   ***    {Are you ordering a CV Procedure (e.g. stress test, cath, DCCV, TEE, etc)?   Press F2        :[id number]}  Dispo: ***  Signed, Ervin Heath, PA

## 2023-12-18 NOTE — Patient Instructions (Signed)
 Medication Instructions:  NO CHANGES *If you need a refill on your cardiac medications before your next appointment, please call your pharmacy*  Lab Work: NO LABS If you have labs (blood work) drawn today and your tests are completely normal, you will receive your results only by: MyChart Message (if you have MyChart) OR A paper copy in the mail If you have any lab test that is abnormal or we need to change your treatment, we will call you to review the results.  Testing/Procedures: NO TESTING  Follow-Up: At [facility], you and your health needs are our priority.  As part of our continuing mission to provide you with exceptional heart care, our providers are all part of one team.  This team includes your primary Cardiologist (physician) and Advanced Practice Providers or APPs (Physician Assistants and Nurse Practitioners) who all work together to provide you with the care you need, when you need it.  Your next appointment:   6 month(s)  Provider:   Randene Bustard, MD    Other Instructions MAY HOLD PLAVIX  FOR 7 DAYS PRIOR TO PROCEDURE AND RESTART IMMEDIATELY AFTER AT [facility] DISCRETION

## 2023-12-22 ENCOUNTER — Encounter: Payer: Self-pay | Admitting: Orthopedic Surgery

## 2023-12-24 NOTE — Telephone Encounter (Signed)
 Next available gel injection will need to be after 06/10/2024 Will submit in October, 2025.

## 2023-12-24 NOTE — Telephone Encounter (Signed)
I emailed her

## 2024-01-01 HISTORY — PX: HIATAL HERNIA REPAIR: SHX195

## 2024-01-08 ENCOUNTER — Other Ambulatory Visit: Payer: Self-pay | Admitting: Nurse Practitioner

## 2024-01-08 DIAGNOSIS — E279 Disorder of adrenal gland, unspecified: Secondary | ICD-10-CM

## 2024-01-09 ENCOUNTER — Ambulatory Visit: Admitting: Orthopedic Surgery

## 2024-01-16 ENCOUNTER — Encounter: Payer: Self-pay | Admitting: Nurse Practitioner

## 2024-01-22 ENCOUNTER — Ambulatory Visit
Admission: RE | Admit: 2024-01-22 | Discharge: 2024-01-22 | Disposition: A | Discharge status: Home / Self Care | Source: Ambulatory Visit | Attending: Nurse Practitioner | Admitting: Nurse Practitioner

## 2024-01-22 DIAGNOSIS — E279 Disorder of adrenal gland, unspecified: Secondary | ICD-10-CM

## 2024-01-22 MED ORDER — GADOPICLENOL 0.5 MMOL/ML IV SOLN
10.0000 mL | Freq: Once | INTRAVENOUS | Status: AC | PRN
Start: 2024-01-22 — End: 2024-01-22
  Administered 2024-01-22: 10 mL via INTRAVENOUS

## 2024-02-06 ENCOUNTER — Ambulatory Visit (INDEPENDENT_AMBULATORY_CARE_PROVIDER_SITE_OTHER): Admitting: Orthopedic Surgery

## 2024-02-06 ENCOUNTER — Encounter: Payer: Self-pay | Admitting: Orthopedic Surgery

## 2024-02-06 VITALS — Ht 66.5 in | Wt 202.4 lb

## 2024-02-06 DIAGNOSIS — M1712 Unilateral primary osteoarthritis, left knee: Secondary | ICD-10-CM | POA: Diagnosis not present

## 2024-02-06 DIAGNOSIS — M1711 Unilateral primary osteoarthritis, right knee: Secondary | ICD-10-CM

## 2024-02-06 NOTE — Progress Notes (Signed)
 Office Visit Note   Patient: Carrie Reynolds           Date of Birth: 1956/04/24           MRN: [id number] Visit Date: 02/06/2024 Requested by: Loring Tanda Mae, MD [address] [place],  [place] [address] PCP: Loring Tanda Mae, MD  Subjective: Chief Complaint  Patient presents with   Other    Bilateral knee pain     HPI: Carrie Reynolds is a 68 y.o. female who presents to the office reporting continued left knee pain.  She has had gel injections and cortisone injections in the past.  Reports pain with ambulation.  MRI scan shows end-stage arthritis primarily in the lateral compartment but also affecting the medial and patellofemoral compartments.  Pain bothers her on a daily basis.  Affects her quality of life.  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                ROS: All systems reviewed are negative as they relate to the chief complaint within the history of present illness.  Patient denies fevers or chills.  Assessment & Plan: Visit Diagnoses:  1. Unilateral primary osteoarthritis, right knee   2. Primary osteoarthritis of left knee     Plan: Impression is end-stage arthritis left knee refractory to nonoperative management.  After long discussion patient would like to proceed with left total knee replacement.  The risk and benefits are discussed with the patient including not limited to infection or vessel damage incomplete pain relief as well as incomplete functional restoration.  No personal or family history of DVT or pulmonary embolism.  Overnight hospitalization followed by initiation of CPM in the hospital.  Rigorous nature and painful nature of the rehabilitative process especially within the first 2 weeks is discussed.  All questions answered  Follow-Up Instructions: No follow-ups on file.   Orders:  No orders of the defined types were placed in this encounter.  No orders of the defined types were placed in this  encounter.     Procedures: No procedures performed   Clinical Data: No additional findings.  Objective: Vital Signs: Ht 5' 6.5 (1.689 m)   Wt 202 lb 6.4 oz (91.8 kg)   LMP 07/24/2003 (Within Months)   BMI 32.18 kg/m   Physical Exam:  Constitutional: Patient appears well-developed HEENT:  Head: Normocephalic Eyes:EOM are normal Neck: Normal range of motion Cardiovascular: Normal rate Pulmonary/chest: Effort normal Neurologic: Patient is alert Skin: Skin is warm Psychiatric: Patient has normal mood and affect  Ortho Exam: Ortho exam demonstrates lateral greater than medial joint line tenderness in the left knee.  No groin pain with internal/external rotation of the leg.  Pedal pulses palpable.  Patient has pretty good range of motion from near full extension to about 115-120 of flexion.  Collateral and cruciate ligaments are stable.  Pedal pulses palpable.  Ankle dorsiflexion intact bilaterally.  Specialty Comments:  Electrodiagnostic study of the right upper limb:  IMPRESSION:   Nerve conduction studies done on both upper extremities were within normal limits. No evidence of a neuropathy is seen. EMG evaluation of the right upper extremity is unremarkable, no evidence of an overlying cervical radiculopathy is noted.   KYM Francis Bull MD 04/06/2015 1:41 PM   [facility]  Imaging: No results found.   PMFS History: Patient Active Problem List   Diagnosis Date Noted   Healthcare maintenance 09/14/2023   Recurrent major depressive episodes,  moderate (HCC) 02/07/2023   Migraine with aura, not intractable, without status migrainosus 02/07/2023   Post-traumatic stress disorder, chronic 02/07/2023   Bloating symptom 09/08/2021   Gastrojejunal ulceration 09/06/2021   Ulcer of small intestine 09/06/2021   Acute blood loss anemia 09/06/2021   Iron deficiency 09/06/2021   Colon cancer screening 09/06/2021   Long term (current) use of  antithrombotics/antiplatelets 09/06/2021   Jejunal ulcer 08/21/2021   Syncope and collapse 07/15/2021   Sinus bradycardia 11/03/2019   Atypical angina (HCC) 10/12/2019   Fatigue 10/12/2019   Presence of 2 overlapping Drug Coated Stents in LAD coronary artery    Coronary artery disease involving native coronary artery of native heart with angina pectoris (HCC) 06/17/2019   DOE (dyspnea on exertion) 06/16/2019   CAD S/P DES PCI-proximal LAD 05/20/2019   Agatston coronary artery calcium  score greater than 400 04/16/2019   Migraine with vertigo 07/29/2018   Tinnitus of both ears 07/29/2018   Rapid palpitations 04/16/2017   Systolic murmur 04/16/2017   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   OSA on CPAP 11/19/2013   Hyperlipidemia with target LDL less than 70 07/29/2013   Obesity (BMI 30.0-34.9) 04/25/2012   Depression with anxiety 04/25/2012   Environmental allergies 04/25/2012   Essential (primary) hypertension 04/25/2012   Chiari malformation type I (HCC) 01/03/2012   Postmenopausal 09/18/2011   Past Medical History:  Diagnosis Date   Anxiety    Arnold-Chiari malformation (HCC) 2006   CAD S/P PCI 05/06/2019   05/06/2019: prox LAD (@ SPI) PCI with Resolute Onyx DES 3.5  x 8, Residual 60% PDA, Normal LVF; 07/02/2019: relook Cath for Anterior Ischemia on Mhoview --> CATH 70% pre-stent/proximal edge dissection --> proximal overlapping DES (Resolute Onyx 3.5 x 8 - new & old stent post-dilated to 3.8 mm)   Cervical strain    syndrome   Cervicogenic headache    Chronic kidney disease    Concussion with loss of consciousness 10/09/2017   CTS (carpal tunnel syndrome)    Depression    GERD (gastroesophageal reflux disease)    Glucose intolerance (impaired glucose tolerance)    High coronary artery calcium  score    Score of 1460.  Coronary CTA shows high-grade proximal-mid LAD stenosis (CT FFR 0.50).  Also PDA/PLA 60 to 70% stenosis (CTO FFR 0.75)   History of kidney stones    Hypertension     Migraine headache    with visual aura   Obesity    OSA (obstructive sleep apnea)    not treated   Postmenopausal    Sleep apnea    Vertigo    post concussive    Family History  Problem Relation Age of Onset   Hypertension Mother    Lung cancer Father    Alzheimer's disease Father    Migraines Father    Hyperlipidemia Sister    Hypertension Sister    Colon cancer Maternal Grandmother    Heart failure Maternal Grandfather    Alzheimer's disease Paternal Grandmother    Lung cancer Paternal Grandfather    Esophageal cancer Neg Hx    Inflammatory bowel disease Neg Hx    Liver disease Neg Hx    Pancreatic cancer Neg Hx    Rectal cancer Neg Hx    Stomach cancer Neg Hx     Past Surgical History:  Procedure Laterality Date   BIOPSY  07/15/2021   Procedure: BIOPSY;  Surgeon: Mansouraty, Aloha Raddle., MD;  Location: [facility] ENDOSCOPY;  Service: Gastroenterology;;   [name]  right great toe   CHOLECYSTECTOMY N/A 06/05/2018   Procedure: LAPAROSCOPIC CHOLECYSTECTOMY WITH POSSIBLE INTRAOPERATIVE CHOLANGIOGRAM;  Surgeon: Vanderbilt Ned, MD;  Location: [facility] OR;  Service: General;  Laterality: N/A;   COLOSTOMY     CORONARY STENT INTERVENTION N/A 05/12/2019   Procedure: CORONARY STENT INTERVENTION;  Surgeon: Anner Alm ORN, MD;  Location: [facility] INVASIVE CV LAB;  Service: Cardiovascular;;; Culprit-p-mLAD 95% 950% SP1) --> Scoring Balloon PTCA -> DES PCI (Resolute DES 3.5 x 38 - 3.8 mm; 0% LAD& 55% SP1). -->  SP1 was somewhat jailed with TIMI II flow post PCI.   CORONARY STENT INTERVENTION N/A 07/02/2019   Procedure: CORONARY STENT INTERVENTION;  Surgeon: Anner Alm ORN, MD;  Location: [facility] INVASIVE CV LAB;  Service: Cardiovascular;; * 70% Proximal stent edge dissection (edge of previous stent that is patent) -->  successfully covered with additional RESOLUTE ONYX DES 3.5 mm x 8 mm overlapping proximal edge of previous stent (postdilated to 3.8 mm)   ENTEROSCOPY N/A 07/15/2021   Procedure:  ENTEROSCOPY;  Surgeon: Wilhelmenia Aloha Raddle., MD;  Location: [facility] ENDOSCOPY;  Service: Gastroenterology;  Laterality: N/A;   GASTRIC BYPASS  11/2015   [facility]   GYNECOLOGIC CRYOSURGERY     HAND SURGERY     LEFT HEART CATH AND CORONARY ANGIOGRAPHY N/A 05/12/2019   Procedure: LEFT HEART CATH AND CORONARY ANGIOGRAPHY;  Surgeon: Anner Alm ORN, MD;  Location: [facility] INVASIVE CV LAB;  Service: Cardiovascular;;; Culprit-p-mLAD 95% 950% SP1) --> DES PCI.;  RPAV 60% -small caliber vessel, did not appear flow-limiting (medical management).  EF 55 to 60%.  Normal LVEDP.   LEFT HEART CATH AND CORONARY ANGIOGRAPHY N/A 07/02/2019   Procedure: LEFT HEART CATH AND CORONARY ANGIOGRAPHY;  Surgeon: Anner Alm ORN, MD;  Location: [facility] INVASIVE CV LAB;  Service: Cardiovascular;; CULPRIT = ~70% proximal edge dissection of previous stent (DES PCI overlapping stent placed) - jails SP1 w/ ~60-70% ostial stenosis.SABRA mLAD 25%. RI 30%, RPAV 60%.   LEFT HEART CATH AND CORONARY ANGIOGRAPHY N/A 03/30/2022   Procedure: LEFT HEART CATH AND CORONARY ANGIOGRAPHY;  Surgeon: Wendel Lurena POUR, MD;  Location: [facility] INVASIVE CV LAB;  Service: Cardiovascular;  Laterality: N/A;   NM MYOVIEW  LTD  06/24/2019   (Lexiscan ): HIGH RISK.  EF 55%.  Large size, moderate severe defect in the anterior wall with associated anterior hypokinesis.  Suspect LAD territory.   NM MYOVIEW  LTD  10/23/2019   Normal EF 55 to 60%.  No EKG changes.  Small size mild severity fixed defect in the apical wall consistent with breast attenuation versus small prior infarct.  Marked improvement from anterior apical perfusion abnormality seen in February 2020.   TRANSTHORACIC ECHOCARDIOGRAM  12/08/2020   Normal LV Fxn - EF 60-65%. No RWMA. Mod Asymmetric Basal Septa LVH with mild Concentric LVH. Gr II DD. Normal Longitudinal Strain. Normal RV size & fxn - normal RAP/CVP.  Normal MV. Mild AoV sclerosis w/ stenosis. Mild Ascending Ao dilation - ~38 mm (borderline).  => No change from  04/2017.   TUBAL LIGATION Bilateral    UPPER GASTROINTESTINAL ENDOSCOPY     VAGINAL HYSTERECTOMY  2003   with LSO and right salpingectomy; right ovary still present   ZIO [facility] MONITOR-14-day  01/2021   Predominant rhythm sinus-heart rate range 40 to 124 bpm.  Average 70 bpm.  Rare PVCs and isolated PACs noted.  PAC couplets and triplets also noted as well as trigeminy.  3 atrial runs of 4-5 beats ranging from 102 to 150 bpm.  (2  of 3 noted on patient trigger.  Remainder patient triggers were sinus rhythm with PACs or PVCs); no atrial fibrillation or other sustained arrhythmias noted.   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

## 2024-02-24 ENCOUNTER — Telehealth: Payer: Self-pay | Admitting: Orthopedic Surgery

## 2024-02-24 NOTE — Telephone Encounter (Signed)
 Left message on patient's voicemail asking her to call to discuss surgery date with Dr. Addie for left total knee arthroplasty. Provided name and direct number for scheduling.

## 2024-02-26 ENCOUNTER — Telehealth: Payer: Self-pay

## 2024-02-26 NOTE — Telephone Encounter (Signed)
   Pre-operative Risk Assessment    Patient Name: Carrie Reynolds  DOB: 1955-11-07 MRN: [id number]   Date of last office visit: 12/18/23 HAO MENG, PA Date of next office visit: NONE   Request for Surgical Clearance    Procedure:  LEFT TOTAL KNEE  Date of Surgery:  Clearance 04/21/24                                Surgeon:  NOT INDICATED Surgeon's Group or Practice Name:  [facility] Phone number:  [phone] Fax number:  [phone]   Type of Clearance Requested:   - Medical  - Pharmacy:  Hold Clopidogrel  (Plavix )     Type of Anesthesia:  Spinal   Additional requests/questions:    SignedLucie DELENA Ku   02/26/2024, 4:45 PM

## 2024-02-27 ENCOUNTER — Telehealth: Payer: Self-pay

## 2024-02-27 NOTE — Telephone Encounter (Signed)
 Left patient a detailed message to get her scheduled for a tele visit. First attempt.

## 2024-02-27 NOTE — Telephone Encounter (Signed)
   Name: Carrie Reynolds  DOB: 06-Jul-1956  MRN: [id number]  Primary Cardiologist: Alm Clay, MD  Chart reviewed as part of pre-operative protocol coverage. Because of Carrie Reynolds's past medical history and time since last visit, she will require a follow-up telephone visit in order to better assess preoperative cardiovascular risk.  Pre-op covering staff: - Please schedule appointment and call patient to inform them. If patient already had an upcoming appointment within acceptable timeframe, please add pre-op clearance to the appointment notes so provider is aware. - Please contact requesting surgeon's office via preferred method (i.e, phone, fax) to inform them of need for appointment prior to surgery.  If needed, she may hold Plavix  for 7 days prior to the procedure and restart as soon as possible afterward at the surgeon's discretion.  Patient lives in [place].  Orren LOISE Fabry, PA-C  02/27/2024, 7:18 AM

## 2024-02-28 NOTE — Telephone Encounter (Signed)
 Patient has been scheduled for 04/01/2024 @ 3pm for tele visit appointment. CGM

## 2024-03-02 ENCOUNTER — Encounter: Payer: Self-pay | Admitting: Orthopedic Surgery

## 2024-03-02 DIAGNOSIS — M25561 Pain in right knee: Secondary | ICD-10-CM

## 2024-03-04 NOTE — Telephone Encounter (Signed)
 MRI order placed.

## 2024-03-04 NOTE — Telephone Encounter (Signed)
 Hi Carrie Reynolds can you order MRI scan right knee to evaluate for meniscal damage status post fall.  Thanks

## 2024-03-05 ENCOUNTER — Encounter: Payer: Self-pay | Admitting: Orthopedic Surgery

## 2024-03-10 ENCOUNTER — Inpatient Hospital Stay: Admission: RE | Admit: 2024-03-10 | Source: Ambulatory Visit

## 2024-03-11 ENCOUNTER — Ambulatory Visit
Admission: RE | Admit: 2024-03-11 | Discharge: 2024-03-11 | Disposition: A | Discharge status: Home / Self Care | Source: Ambulatory Visit | Attending: Orthopedic Surgery | Admitting: Orthopedic Surgery

## 2024-03-11 DIAGNOSIS — M25561 Pain in right knee: Secondary | ICD-10-CM

## 2024-03-18 ENCOUNTER — Ambulatory Visit (INDEPENDENT_AMBULATORY_CARE_PROVIDER_SITE_OTHER): Admitting: Orthopedic Surgery

## 2024-03-18 DIAGNOSIS — M25561 Pain in right knee: Secondary | ICD-10-CM

## 2024-03-18 DIAGNOSIS — M1711 Unilateral primary osteoarthritis, right knee: Secondary | ICD-10-CM

## 2024-03-19 ENCOUNTER — Encounter: Payer: Self-pay | Admitting: Orthopedic Surgery

## 2024-03-19 NOTE — Progress Notes (Signed)
 Office Visit Note   Patient: Carrie Reynolds           Date of Birth: 1956/04/24           MRN: [id number] Visit Date: 03/18/2024 Requested by: Loring Tanda Mae, MD [address] [place],  [place] [address] PCP: Loring Tanda Mae, MD  Subjective: Chief Complaint  Patient presents with   Right Knee - Pain, Injury    HPI: Carrie Reynolds is a 68 y.o. female who presents to the office reporting right knee pain.  She was hanging wallpaper 2 weeks ago and her left knee gave way.  She twisted the right knee.  Scheduled for left knee replacement in the near future.  She has had a right knee MRI since that injury.  Scan is reviewed.  Shows a leaking Baker's cyst with mild tricompartmental arthritis.  Gel shot on the right did not give her too much relief impression is no structural problem in that right knee.  Plan to continue with left knee replacement which has end-stage arthritis particular in the lateral compartment.  Follow-up 2 weeks after the left knee replacement.                ROS: All systems reviewed are negative as they relate to the chief complaint within the history of present illness.  Patient denies fevers or chills.  Assessment & Plan: Visit Diagnoses: No diagnosis found.  Plan: See above  Follow-Up Instructions: No follow-ups on file.   Orders:  No orders of the defined types were placed in this encounter.  No orders of the defined types were placed in this encounter.     Procedures: No procedures performed   Clinical Data: No additional findings.  Objective: Vital Signs: LMP 07/24/2003 (Within Months)   Physical Exam:  Constitutional: Patient appears well-developed HEENT:  Head: Normocephalic Eyes:EOM are normal Neck: Normal range of motion Cardiovascular: Normal rate Pulmonary/chest: Effort normal Neurologic: Patient is alert Skin: Skin is warm Psychiatric: Patient has normal mood and affect  Ortho Exam: Ortho exam demonstrates  full range of motion with no effusion in the right knee.  Extensor mechanism intact.  No groin pain with internal/external rotation of the leg.  Pedal pulses palpable.  Specialty Comments:  Electrodiagnostic study of the right upper limb:  IMPRESSION:   Nerve conduction studies done on both upper extremities were within normal limits. No evidence of a neuropathy is seen. EMG evaluation of the right upper extremity is unremarkable, no evidence of an overlying cervical radiculopathy is noted.   KYM Francis Bull MD 04/06/2015 1:41 PM   [facility]  Imaging: No results found.   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   Ulcer of small intestine 09/06/2021   Acute blood loss anemia 09/06/2021   Iron deficiency 09/06/2021   Colon cancer screening 09/06/2021   Long term (current) use of antithrombotics/antiplatelets 09/06/2021   Jejunal ulcer 08/21/2021   Syncope and collapse 07/15/2021   Sinus bradycardia 11/03/2019   Atypical angina (HCC) 10/12/2019   Fatigue 10/12/2019   Presence of 2 overlapping Drug Coated Stents in LAD coronary artery    Coronary artery disease involving native coronary artery of native heart with angina pectoris (  HCC) 06/17/2019   DOE (dyspnea on exertion) 06/16/2019   CAD S/P DES PCI-proximal LAD 05/20/2019   Agatston coronary artery calcium  score greater than 400 04/16/2019   Migraine with vertigo 07/29/2018   Tinnitus of both ears 07/29/2018   Rapid palpitations 04/16/2017   Systolic murmur 04/16/2017   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   OSA on CPAP 11/19/2013   Hyperlipidemia with target LDL less than 70 07/29/2013    Obesity (BMI 30.0-34.9) 04/25/2012   Depression with anxiety 04/25/2012   Environmental allergies 04/25/2012   Essential (primary) hypertension 04/25/2012   Chiari malformation type I (HCC) 01/03/2012   Postmenopausal 09/18/2011   Past Medical History:  Diagnosis Date   Anxiety    Arnold-Chiari malformation (HCC) 2006   CAD S/P PCI 05/06/2019   05/06/2019: prox LAD (@ SPI) PCI with Resolute Onyx DES 3.5  x 8, Residual 60% PDA, Normal LVF; 07/02/2019: relook Cath for Anterior Ischemia on Mhoview --> CATH 70% pre-stent/proximal edge dissection --> proximal overlapping DES (Resolute Onyx 3.5 x 8 - new & old stent post-dilated to 3.8 mm)   Cervical strain    syndrome   Cervicogenic headache    Chronic kidney disease    Concussion with loss of consciousness 10/09/2017   CTS (carpal tunnel syndrome)    Depression    GERD (gastroesophageal reflux disease)    Glucose intolerance (impaired glucose tolerance)    High coronary artery calcium  score    Score of 1460.  Coronary CTA shows high-grade proximal-mid LAD stenosis (CT FFR 0.50).  Also PDA/PLA 60 to 70% stenosis (CTO FFR 0.75)   History of kidney stones    Hypertension    Migraine headache    with visual aura   Obesity    OSA (obstructive sleep apnea)    not treated   Postmenopausal    Sleep apnea    Vertigo    post concussive    Family History  Problem Relation Age of Onset   Hypertension Mother    Lung cancer Father    Alzheimer's disease Father    Migraines Father    Hyperlipidemia Sister    Hypertension Sister    Colon cancer Maternal Grandmother    Heart failure Maternal Grandfather    Alzheimer's disease Paternal Grandmother    Lung cancer Paternal Grandfather    Esophageal cancer Neg Hx    Inflammatory bowel disease Neg Hx    Liver disease Neg Hx    Pancreatic cancer Neg Hx    Rectal cancer Neg Hx    Stomach cancer Neg Hx     Past Surgical History:  Procedure Laterality Date   BIOPSY  07/15/2021    Procedure: BIOPSY;  Surgeon: Mansouraty, Aloha Raddle., MD;  Location: [facility] ENDOSCOPY;  Service: Gastroenterology;;   [name]     right great toe   CHOLECYSTECTOMY N/A 06/05/2018   Procedure: LAPAROSCOPIC CHOLECYSTECTOMY WITH POSSIBLE INTRAOPERATIVE CHOLANGIOGRAM;  Surgeon: Vanderbilt Ned, MD;  Location: [facility] OR;  Service: General;  Laterality: N/A;   COLOSTOMY     CORONARY STENT INTERVENTION N/A 05/12/2019   Procedure: CORONARY STENT INTERVENTION;  Surgeon: Anner Alm ORN, MD;  Location: [facility] INVASIVE CV LAB;  Service: Cardiovascular;;; Culprit-p-mLAD 95% 950% SP1) --> Scoring Balloon PTCA -> DES PCI (Resolute DES 3.5 x 38 - 3.8 mm; 0% LAD& 55% SP1). -->  SP1 was somewhat jailed with TIMI II flow post PCI.   CORONARY STENT INTERVENTION N/A 07/02/2019   Procedure: CORONARY STENT INTERVENTION;  Surgeon: Anner Alm ORN, MD;  Location: [facility] INVASIVE CV LAB;  Service: Cardiovascular;; * 70% Proximal stent edge dissection (edge of previous stent that is patent) -->  successfully covered with additional RESOLUTE ONYX DES 3.5 mm x 8 mm overlapping proximal edge of previous stent (postdilated to 3.8 mm)   ENTEROSCOPY N/A 07/15/2021   Procedure: ENTEROSCOPY;  Surgeon: Wilhelmenia Aloha Raddle., MD;  Location: [facility] ENDOSCOPY;  Service: Gastroenterology;  Laterality: N/A;   GASTRIC BYPASS  11/2015   [facility]   GYNECOLOGIC CRYOSURGERY     HAND SURGERY     LEFT HEART CATH AND CORONARY ANGIOGRAPHY N/A 05/12/2019   Procedure: LEFT HEART CATH AND CORONARY ANGIOGRAPHY;  Surgeon: Anner Alm ORN, MD;  Location: [facility] INVASIVE CV LAB;  Service: Cardiovascular;;; Culprit-p-mLAD 95% 950% SP1) --> DES PCI.;  RPAV 60% -small caliber vessel, did not appear flow-limiting (medical management).  EF 55 to 60%.  Normal LVEDP.   LEFT HEART CATH AND CORONARY ANGIOGRAPHY N/A 07/02/2019   Procedure: LEFT HEART CATH AND CORONARY ANGIOGRAPHY;  Surgeon: Anner Alm ORN, MD;  Location: [facility] INVASIVE CV LAB;  Service: Cardiovascular;; CULPRIT =  ~70% proximal edge dissection of previous stent (DES PCI overlapping stent placed) - jails SP1 w/ ~60-70% ostial stenosis.SABRA mLAD 25%. RI 30%, RPAV 60%.   LEFT HEART CATH AND CORONARY ANGIOGRAPHY N/A 03/30/2022   Procedure: LEFT HEART CATH AND CORONARY ANGIOGRAPHY;  Surgeon: Wendel Lurena POUR, MD;  Location: [facility] INVASIVE CV LAB;  Service: Cardiovascular;  Laterality: N/A;   NM MYOVIEW  LTD  06/24/2019   (Lexiscan ): HIGH RISK.  EF 55%.  Large size, moderate severe defect in the anterior wall with associated anterior hypokinesis.  Suspect LAD territory.   NM MYOVIEW  LTD  10/23/2019   Normal EF 55 to 60%.  No EKG changes.  Small size mild severity fixed defect in the apical wall consistent with breast attenuation versus small prior infarct.  Marked improvement from anterior apical perfusion abnormality seen in February 2020.   TRANSTHORACIC ECHOCARDIOGRAM  12/08/2020   Normal LV Fxn - EF 60-65%. No RWMA. Mod Asymmetric Basal Septa LVH with mild Concentric LVH. Gr II DD. Normal Longitudinal Strain. Normal RV size & fxn - normal RAP/CVP.  Normal MV. Mild AoV sclerosis w/ stenosis. Mild Ascending Ao dilation - ~38 mm (borderline).  => No change from 04/2017.   TUBAL LIGATION Bilateral    UPPER GASTROINTESTINAL ENDOSCOPY     VAGINAL HYSTERECTOMY  2003   with LSO and right salpingectomy; right ovary still present   ZIO [facility] MONITOR-14-day  01/2021   Predominant rhythm sinus-heart rate range 40 to 124 bpm.  Average 70 bpm.  Rare PVCs and isolated PACs noted.  PAC couplets and triplets also noted as well as trigeminy.  3 atrial runs of 4-5 beats ranging from 102 to 150 bpm.  (2 of 3 noted on patient trigger.  Remainder patient triggers were sinus rhythm with PACs or PVCs); no atrial fibrillation or other sustained arrhythmias noted.   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

## 2024-04-01 ENCOUNTER — Ambulatory Visit: Attending: Cardiovascular Disease

## 2024-04-01 DIAGNOSIS — Z01810 Encounter for preprocedural cardiovascular examination: Secondary | ICD-10-CM

## 2024-04-01 MED ORDER — ROSUVASTATIN CALCIUM 40 MG PO TABS: 40.0000 mg | ORAL_TABLET | Freq: Every day | ORAL | 6 refills | Status: DC

## 2024-04-01 MED ORDER — LOSARTAN POTASSIUM-HCTZ 100-25 MG PO TABS: 1.0000 | ORAL_TABLET | Freq: Every day | ORAL | 6 refills | Status: DC

## 2024-04-01 MED ORDER — ROSUVASTATIN CALCIUM 40 MG PO TABS: 40.0000 mg | ORAL_TABLET | Freq: Every day | ORAL | 3 refills | Status: AC

## 2024-04-01 MED ORDER — LOSARTAN POTASSIUM-HCTZ 100-25 MG PO TABS: 1.0000 | ORAL_TABLET | Freq: Every day | ORAL | 3 refills | Status: AC

## 2024-04-01 NOTE — Addendum Note (Signed)
 Addended by: LUCIEN ORREN SAILOR on: 04/01/2024 03:22 PM   Modules accepted: Orders

## 2024-04-01 NOTE — Progress Notes (Signed)
 Virtual Visit via Telephone Note   Because of Carrie Reynolds co-morbid illnesses, she is at least at moderate risk for complications without adequate follow up.  This format is felt to be most appropriate for this patient at this time.  Due to technical limitations with video connection (technology), today's appointment will be conducted as an audio only telehealth visit, and Carrie Reynolds verbally agreed to proceed in this manner.   All issues noted in this document were discussed and addressed.  No physical exam could be performed with this format.  Evaluation Performed:  Preoperative cardiovascular risk assessment _____________   Date:  04/01/2024   Patient ID:  Carrie Reynolds, DOB 12/07/1955, MRN [id number] Patient Location:  Home Provider location:   Office  Primary Care Provider:  Loring Tanda Mae, MD Primary Cardiologist:  Alm Clay, MD  Chief Complaint / Patient Profile   68 y.o. y/o female with a h/o CAD status post DES x 2 to LAD, sinus bradycardia, hypertension, hyperlipidemia, and OSA who is pending right total knee arthroplasty and presents today for telephonic preoperative cardiovascular risk assessment.  History of Present Illness    Carrie Reynolds is a 68 y.o. female who presents via audio/video conferencing for a telehealth visit today.  Pt was last seen in cardiology clinic on 12/18/2023 by Scot Ford, PA-C.  At that time Carrie Reynolds was doing well .  The patient is now pending procedure as outlined above. Since her last visit, she states she has had no SOB or CP. No bradycardia. She had a hiatal hernia surgery June 11th and did well with that. She does meet mets.   If needed, she may hold Plavix  for 7 days prior to the procedure and restart as soon as possible afterward at the surgeon's discretion. She has an allergy to ASA.   REFILLS SENT: Hyzaar 100/25mg  daily, Crestor  40mg  daily.   Past Medical History    Past Medical History:   Diagnosis Date   Anxiety    Arnold-Chiari malformation (HCC) 2006   CAD S/P PCI 05/06/2019   05/06/2019: prox LAD (@ SPI) PCI with Resolute Onyx DES 3.5  x 8, Residual 60% PDA, Normal LVF; 07/02/2019: relook Cath for Anterior Ischemia on Mhoview --> CATH 70% pre-stent/proximal edge dissection --> proximal overlapping DES (Resolute Onyx 3.5 x 8 - new & old stent post-dilated to 3.8 mm)   Cervical strain    syndrome   Cervicogenic headache    Chronic kidney disease    Concussion with loss of consciousness 10/09/2017   CTS (carpal tunnel syndrome)    Depression    GERD (gastroesophageal reflux disease)    Glucose intolerance (impaired glucose tolerance)    High coronary artery calcium  score    Score of 1460.  Coronary CTA shows high-grade proximal-mid LAD stenosis (CT FFR 0.50).  Also PDA/PLA 60 to 70% stenosis (CTO FFR 0.75)   History of kidney stones    Hypertension    Migraine headache    with visual aura   Obesity    OSA (obstructive sleep apnea)    not treated   Postmenopausal    Sleep apnea    Vertigo    post concussive   Past Surgical History:  Procedure Laterality Date   BIOPSY  07/15/2021   Procedure: BIOPSY;  Surgeon: Wilhelmenia Aloha Raddle., MD;  Location: [facility] ENDOSCOPY;  Service: Gastroenterology;;   BUNIONECTOMY     right great toe   CHOLECYSTECTOMY N/A 06/05/2018   Procedure: LAPAROSCOPIC  CHOLECYSTECTOMY WITH POSSIBLE INTRAOPERATIVE CHOLANGIOGRAM;  Surgeon: Vanderbilt Ned, MD;  Location: [facility] OR;  Service: General;  Laterality: N/A;   COLOSTOMY     CORONARY STENT INTERVENTION N/A 05/12/2019   Procedure: CORONARY STENT INTERVENTION;  Surgeon: Anner Alm ORN, MD;  Location: [facility] INVASIVE CV LAB;  Service: Cardiovascular;;; Culprit-p-mLAD 95% 950% SP1) --> Scoring Balloon PTCA -> DES PCI (Resolute DES 3.5 x 38 - 3.8 mm; 0% LAD& 55% SP1). -->  SP1 was somewhat jailed with TIMI II flow post PCI.   CORONARY STENT INTERVENTION N/A 07/02/2019   Procedure: CORONARY STENT  INTERVENTION;  Surgeon: Anner Alm ORN, MD;  Location: [facility] INVASIVE CV LAB;  Service: Cardiovascular;; * 70% Proximal stent edge dissection (edge of previous stent that is patent) -->  successfully covered with additional RESOLUTE ONYX DES 3.5 mm x 8 mm overlapping proximal edge of previous stent (postdilated to 3.8 mm)   ENTEROSCOPY N/A 07/15/2021   Procedure: ENTEROSCOPY;  Surgeon: Wilhelmenia Aloha Raddle., MD;  Location: [facility] ENDOSCOPY;  Service: Gastroenterology;  Laterality: N/A;   GASTRIC BYPASS  11/2015   [facility]   GYNECOLOGIC CRYOSURGERY     HAND SURGERY     LEFT HEART CATH AND CORONARY ANGIOGRAPHY N/A 05/12/2019   Procedure: LEFT HEART CATH AND CORONARY ANGIOGRAPHY;  Surgeon: Anner Alm ORN, MD;  Location: [facility] INVASIVE CV LAB;  Service: Cardiovascular;;; Culprit-p-mLAD 95% 950% SP1) --> DES PCI.;  RPAV 60% -small caliber vessel, did not appear flow-limiting (medical management).  EF 55 to 60%.  Normal LVEDP.   LEFT HEART CATH AND CORONARY ANGIOGRAPHY N/A 07/02/2019   Procedure: LEFT HEART CATH AND CORONARY ANGIOGRAPHY;  Surgeon: Anner Alm ORN, MD;  Location: [facility] INVASIVE CV LAB;  Service: Cardiovascular;; CULPRIT = ~70% proximal edge dissection of previous stent (DES PCI overlapping stent placed) - jails SP1 w/ ~60-70% ostial stenosis.SABRA mLAD 25%. RI 30%, RPAV 60%.   LEFT HEART CATH AND CORONARY ANGIOGRAPHY N/A 03/30/2022   Procedure: LEFT HEART CATH AND CORONARY ANGIOGRAPHY;  Surgeon: Wendel Lurena POUR, MD;  Location: [facility] INVASIVE CV LAB;  Service: Cardiovascular;  Laterality: N/A;   NM MYOVIEW  LTD  06/24/2019   (Lexiscan ): HIGH RISK.  EF 55%.  Large size, moderate severe defect in the anterior wall with associated anterior hypokinesis.  Suspect LAD territory.   NM MYOVIEW  LTD  10/23/2019   Normal EF 55 to 60%.  No EKG changes.  Small size mild severity fixed defect in the apical wall consistent with breast attenuation versus small prior infarct.  Marked improvement from anterior apical perfusion  abnormality seen in February 2020.   TRANSTHORACIC ECHOCARDIOGRAM  12/08/2020   Normal LV Fxn - EF 60-65%. No RWMA. Mod Asymmetric Basal Septa LVH with mild Concentric LVH. Gr II DD. Normal Longitudinal Strain. Normal RV size & fxn - normal RAP/CVP.  Normal MV. Mild AoV sclerosis w/ stenosis. Mild Ascending Ao dilation - ~38 mm (borderline).  => No change from 04/2017.   TUBAL LIGATION Bilateral    UPPER GASTROINTESTINAL ENDOSCOPY     VAGINAL HYSTERECTOMY  2003   with LSO and right salpingectomy; right ovary still present   ZIO [facility] MONITOR-14-day  01/2021   Predominant rhythm sinus-heart rate range 40 to 124 bpm.  Average 70 bpm.  Rare PVCs and isolated PACs noted.  PAC couplets and triplets also noted as well as trigeminy.  3 atrial runs of 4-5 beats ranging from 102 to 150 bpm.  (2 of 3 noted on patient trigger.  Remainder patient triggers were sinus  rhythm with PACs or PVCs); no atrial fibrillation or other sustained arrhythmias noted.    Allergies  Allergies  Allergen Reactions   Ceclor [Cefaclor] Hives and Rash   Tetracyclines & Related Hives   Aspirin  Other (See Comments)    Patient reports she does not take -hx of stomach ulcer 06/2021   Lipitor [Atorvastatin Calcium ] Cough   Penicillins Swelling and Other (See Comments)    Has patient had a PCN reaction causing immediate rash, facial/tongue/throat swelling, SOB or lightheadedness with hypotension: No Has patient had a PCN reaction causing severe rash involving mucus membranes or skin necrosis: No Has patient had a PCN reaction that required hospitalization: Yes Has patient had a PCN reaction occurring within the last 10 years: No If all of the above answers are NO, then may proceed with Cephalosporin use.   Amoxicillin Rash   Ampicillin Rash   Lisinopril Cough   Simvastatin Cough    Home Medications    Prior to Admission medications   Medication Sig Start Date End Date Taking? Authorizing Provider  acetaminophen   (TYLENOL ) 500 MG tablet Take 1,000 mg by mouth every 6 (six) hours as needed for mild pain, moderate pain or headache.    [provider]  buPROPion (WELLBUTRIN XL) 150 MG 24 hr tablet Take 300 mg by mouth daily. Take 2 Tablets Daily    [provider]  cetirizine (ZYRTEC) 10 MG tablet Take 10 mg by mouth daily as needed for allergies. 10/04/21   [provider]  clopidogrel  (PLAVIX ) 75 MG tablet TAKE 1 TABLET BY MOUTH EVERY DAY Patient taking differently: Take 75 mg by mouth daily. 11/29/20   Anner Alm ORN, MD  diclofenac  Sodium (VOLTAREN ) 1 % GEL Apply 2 g topically daily as needed (Pain). 10/04/21   [provider]  famotidine  (PEPCID ) 40 MG tablet Take 1 tablet (40 mg total) by mouth 2 (two) times daily. 03/23/22   Beather Delon Gibson, PA  ferrous sulfate  325 (65 FE) MG tablet Take 1 tablet (325 mg total) by mouth daily with breakfast. Patient not taking: Reported on 12/18/2023 09/07/21 05/15/22  Mansouraty, Aloha Raddle., MD  hydrochlorothiazide  (HYDRODIURIL ) 12.5 MG tablet Take 12.5 mg by mouth daily. 03/11/22   [provider]  isosorbide  mononitrate (IMDUR ) 60 MG 24 hr tablet Take 1 tablet (60 mg total) by mouth daily. 12/18/23 03/17/24  Meng, Hao, PA  losartan -hydrochlorothiazide  (HYZAAR) 100-12.5 MG tablet Take 0.5 tablets by mouth daily. Patient taking differently: Take 1 tablet by mouth daily. 06/16/19   Anner Alm ORN, MD  nitroGLYCERIN  (NITROSTAT ) 0.4 MG SL tablet Place 1 tablet (0.4 mg total) under the tongue every 5 (five) minutes as needed for chest pain. 12/18/23   Janene Boer, PA  omeprazole  (PRILOSEC) 40 MG capsule Take 1 capsule by mouth twice daily. May open capsule and place in applesauce or yogurt. 05/04/22   Esterwood, Amy S, PA-C  progesterone (PROMETRIUM) 100 MG capsule Take 100 mg by mouth daily.    [provider]  propranolol  (INDERAL ) 10 MG tablet May take  1 to 2  tablets a day  as needed for palpiptation Patient not  taking: Reported on 12/18/2023 08/21/21   Cindie Delon POUR, PA-C  rosuvastatin  (CRESTOR ) 40 MG tablet Take 40 mg by mouth daily. 06/27/20   [provider]  Semaglutide-Weight Management (WEGOVY) 0.5 MG/0.5ML SOAJ Inject 0.5 mg into the skin once a week.    [provider]  sertraline  (ZOLOFT ) 100 MG tablet Take 100-200 mg by mouth  daily as needed (Mood).    [provider]  sucralfate  (CARAFATE ) 1 g tablet TAKE 1 TABLET (1 G TOTAL) BY MOUTH 4 TIMES A DAY WITH MEALS AND AT BEDTIME Patient taking differently: Take 1 g by mouth as needed. FOR FLARE UP / ULCERS 09/26/22   Mansouraty, Aloha Raddle., MD    Physical Exam    Vital Signs:  Carrie Reynolds does not have vital signs available for review today.  Given telephonic nature of communication, physical exam is limited. AAOx3. NAD. Normal affect.  Speech and respirations are unlabored.  Accessory Clinical Findings    None  Assessment & Plan    1.  Preoperative Cardiovascular Risk Assessment:  Ms. Derringer perioperative risk of a major cardiac event is 0.9% according to the Revised Cardiac Risk Index (RCRI).  Therefore, she is at low risk for perioperative complications.   Her functional capacity is good at 5.99 METs according to the [facility] Activity Status Index (DASI). Recommendations: According to ACC/AHA guidelines, no further cardiovascular testing needed.  The patient may proceed to surgery at acceptable risk.   Antiplatelet and/or Anticoagulation Recommendations: Clopidogrel  (Plavix ) can be held for 7 days prior to her surgery and resumed as soon as possible post op.  The patient was advised that if she develops new symptoms prior to surgery to contact our office to arrange for a follow-up visit, and she verbalized understanding.   A copy of this note will be routed to requesting surgeon.  Time:   Today, I have spent 10 minutes with the patient with telehealth technology discussing medical history,  symptoms, and management plan.     Orren LOISE Fabry, PA-C  04/01/2024, 1:04 PM

## 2024-04-06 ENCOUNTER — Encounter: Payer: Self-pay | Admitting: Orthopedic Surgery

## 2024-04-07 NOTE — Telephone Encounter (Signed)
 Hi [name].  Can you call nurse practitioner Drucilla from [facility] to let her know that her spleen was normal in 2019 but bigger now and see what they want to do about it.  Thanks.  And she may need to get in touch with [facility] but I am not really entirely sure who she sees out at [facility] because she sees a lot of different people out there thanks

## 2024-04-21 DIAGNOSIS — Z01818 Encounter for other preprocedural examination: Secondary | ICD-10-CM

## 2024-05-06 ENCOUNTER — Encounter: Admitting: Orthopedic Surgery

## 2024-05-14 DIAGNOSIS — Z01818 Encounter for other preprocedural examination: Secondary | ICD-10-CM

## 2024-05-19 NOTE — Pre-Procedure Instructions (Signed)
 Surgical Instructions   Your procedure is scheduled on May 26, 2024. Report to [facility] Main Entrance A at 9:27 A.M., then check in with the Admitting office. Any questions or running late day of surgery: call [phone]  Questions prior to your surgery date: call [phone], Monday-Friday, 8am-4pm. If you experience any cold or flu symptoms such as cough, fever, chills, shortness of breath, etc. between now and your scheduled surgery, please notify us  at the above number.     Remember:  Do not eat after midnight the night before your surgery  You may drink clear liquids until 8:27AM the morning of your surgery.   Clear liquids allowed are: Water, Non-Citrus Juices (without pulp), Carbonated Beverages, Clear Tea (no milk, honey, etc.), Black Coffee Only (NO MILK, CREAM OR POWDERED CREAMER of any kind), and Gatorade.   Patient Instructions  The night before surgery:  No food after midnight. ONLY clear liquids after midnight  The day of surgery (if you do NOT have diabetes):  Drink ONE (1) Pre-Surgery Clear Ensure by 8:27 AM the morning of surgery. Drink in one sitting. Do not sip.  This drink was given to you during your hospital  pre-op appointment visit.  Nothing else to drink after completing the  Pre-Surgery Clear Ensure.         If you have questions, please contact your surgeon's office.  Take these medicines the morning of surgery with A SIP OF WATER: cetirizine (ZYRTEC)  isosorbide  mononitrate (IMDUR )  pantoprazole  (PROTONIX )  propranolol  (INDERAL )  rosuvastatin  (CRESTOR )  sertraline  (ZOLOFT )   May take these medicines IF NEEDED: acetaminophen  (TYLENOL   nitroGLYCERIN  (NITROSTAT ) 0.4 MG SL tablet - if needed please call us  at [phone]  One week prior to surgery, STOP taking any Aspirin  (unless otherwise instructed by your surgeon) Aleve, Naproxen, Ibuprofen , Motrin , Advil , Goody's, BC's, all herbal medications, fish oil, and non-prescription  vitamins.                     Do NOT Smoke (Tobacco/Vaping) for 24 hours prior to your procedure.  If you use a CPAP at night, you may bring your mask/headgear for your overnight stay.   You will be asked to remove any contacts, glasses, piercing's, hearing aid's, dentures/partials prior to surgery. Please bring cases for these items if needed.    Patients discharged the day of surgery will not be allowed to drive home, and someone needs to stay with them for 24 hours.  SURGICAL WAITING ROOM VISITATION Patients may have no more than 2 support people in the waiting area - these visitors may rotate.   Pre-op nurse will coordinate an appropriate time for 1 ADULT support person, who may not rotate, to accompany patient in pre-op.  Children under the age of 45 must have an adult with them who is not the patient and must remain in the main waiting area with an adult.  If the patient needs to stay at the hospital during part of their recovery, the visitor guidelines for inpatient rooms apply.  Please refer to the [facility] website for the visitor guidelines for any additional information.   If you received a COVID test during your pre-op visit  it is requested that you wear a mask when out in public, stay away from anyone that may not be feeling well and notify your surgeon if you develop symptoms. If you have been in contact with anyone that has tested positive in the last 10 days please notify you  careers adviser.      Pre-operative 4 CHG Bathing Instructions   You can play a key role in reducing the risk of infection after surgery. Your skin needs to be as free of germs as possible. You can reduce the number of germs on your skin by washing with CHG (chlorhexidine  gluconate) soap before surgery. CHG is an antiseptic soap that kills germs and continues to kill germs even after washing.   DO NOT use if you have an allergy to chlorhexidine /CHG or antibacterial soaps. If your skin becomes reddened or  irritated, stop using the CHG and notify one of our RNs at [phone].   Please shower with the CHG soap starting 4 days before surgery using the following schedule:     Please keep in mind the following:  DO NOT shave, including legs and underarms, starting the day of your first shower.   You may shave your face at any point before/day of surgery.  Place clean sheets on your bed the day you start using CHG soap. Use a clean washcloth (not used since being washed) for each shower. DO NOT sleep with pets once you start using the CHG.   CHG Shower Instructions:  Wash your face and private area with normal soap. If you choose to wash your hair, wash first with your normal shampoo.  After you use shampoo/soap, rinse your hair and body thoroughly to remove shampoo/soap residue.  Turn the water OFF and apply  bottle of CHG soap to a CLEAN washcloth.  Apply CHG soap ONLY FROM YOUR NECK DOWN TO YOUR TOES (washing for 3-5 minutes)  DO NOT use CHG soap on face, private areas, open wounds, or sores.  Pay special attention to the area where your surgery is being performed.  If you are having back surgery, having someone wash your back for you may be helpful. Wait 2 minutes after CHG soap is applied, then you may rinse off the CHG soap.  Pat dry with a clean towel  Put on clean clothes/pajamas   If you choose to wear lotion, please use ONLY the CHG-compatible lotions that are listed below.  Additional instructions for the day of surgery:  If you choose, you may shower the morning of surgery with an antibacterial soap.  DO NOT APPLY any lotions, deodorants, cologne, or perfumes.   Do not bring valuables to the hospital. [facility] is not responsible for any belongings/valuables. Do not wear nail polish, gel polish, artificial nails, or any other type of covering on natural nails (fingers and toes) Do not wear jewelry or makeup Put on clean/comfortable clothes.  Please brush your teeth.  Ask  your nurse before applying any prescription medications to the skin.     CHG Compatible Lotions   Aveeno Moisturizing lotion  Cetaphil Moisturizing Cream  Cetaphil Moisturizing Lotion  Clairol Herbal Essence Moisturizing Lotion, Dry Skin  Clairol Herbal Essence Moisturizing Lotion, Extra Dry Skin  Clairol Herbal Essence Moisturizing Lotion, Normal Skin  Curel Age Defying Therapeutic Moisturizing Lotion with Alpha Hydroxy  Curel Extreme Care Body Lotion  Curel Soothing Hands Moisturizing Hand Lotion  Curel Therapeutic Moisturizing Cream, Fragrance-Free  Curel Therapeutic Moisturizing Lotion, Fragrance-Free  Curel Therapeutic Moisturizing Lotion, Original Formula  Eucerin Daily Replenishing Lotion  Eucerin Dry Skin Therapy Plus Alpha Hydroxy Crme  Eucerin Dry Skin Therapy Plus Alpha Hydroxy Lotion  Eucerin Original Crme  Eucerin Original Lotion  Eucerin Plus Crme Eucerin Plus Lotion  Eucerin TriLipid Replenishing Lotion  Keri Anti-Bacterial Hand Lotion  Keri Deep Conditioning Original Lotion Dry Skin Formula Softly Scented  Keri Deep Conditioning Original Lotion, Fragrance Free Sensitive Skin Formula  Keri Lotion Fast Absorbing Fragrance Free Sensitive Skin Formula  Keri Lotion Fast Absorbing Softly Scented Dry Skin Formula  Keri Original Lotion  Keri Skin Renewal Lotion Keri Silky Smooth Lotion  Keri Silky Smooth Sensitive Skin Lotion  Nivea Body Creamy Conditioning Oil  Nivea Body Extra Enriched Lotion  Nivea Body Original Lotion  Nivea Body Sheer Moisturizing Lotion Nivea Crme  Nivea Skin Firming Lotion  NutraDerm 30 Skin Lotion  NutraDerm Skin Lotion  NutraDerm Therapeutic Skin Cream  NutraDerm Therapeutic Skin Lotion  ProShield Protective Hand Cream  Provon moisturizing lotion  Please read over the following fact sheets that you were given.

## 2024-05-20 ENCOUNTER — Encounter (HOSPITAL_COMMUNITY): Payer: Self-pay

## 2024-05-20 ENCOUNTER — Encounter (HOSPITAL_COMMUNITY)
Admission: RE | Admit: 2024-05-20 | Discharge: 2024-05-20 | Disposition: A | Discharge status: Home / Self Care | Source: Ambulatory Visit | Attending: Orthopedic Surgery | Admitting: Orthopedic Surgery

## 2024-05-20 ENCOUNTER — Other Ambulatory Visit: Payer: Self-pay

## 2024-05-20 VITALS — BP 131/72 | HR 62 | Temp 97.9°F | Resp 16 | Ht 66.0 in | Wt 208.0 lb

## 2024-05-20 DIAGNOSIS — Z01812 Encounter for preprocedural laboratory examination: Secondary | ICD-10-CM | POA: Diagnosis present

## 2024-05-20 DIAGNOSIS — Z01818 Encounter for other preprocedural examination: Secondary | ICD-10-CM

## 2024-05-20 HISTORY — DX: Personal history of other diseases of the digestive system: Z87.19

## 2024-05-20 HISTORY — DX: Unspecified osteoarthritis, unspecified site: M19.90

## 2024-05-20 LAB — BASIC METABOLIC PANEL WITH GFR
Anion gap: 17 — ABNORMAL HIGH (ref 5–15)
BUN: 17 mg/dL (ref 8–23)
CO2: 24 mmol/L (ref 22–32)
Calcium: 9 mg/dL (ref 8.9–10.3)
Chloride: 95 mmol/L — ABNORMAL LOW (ref 98–111)
Creatinine, Ser: 0.9 mg/dL (ref 0.44–1.00)
GFR, Estimated: >60 mL/min (ref >60)
Glucose, Bld: 98 mg/dL (ref 70–99)
Potassium: 3.3 mmol/L — ABNORMAL LOW (ref 3.5–5.1)
Sodium: 136 mmol/L (ref 135–145)

## 2024-05-20 LAB — URINALYSIS, W/ REFLEX TO CULTURE (INFECTION SUSPECTED)
Bacteria, UA: NONE SEEN
Bilirubin Urine: NEGATIVE
Glucose, UA: NEGATIVE mg/dL
Ketones, ur: NEGATIVE mg/dL
Nitrite: NEGATIVE
Protein, ur: NEGATIVE mg/dL
Specific Gravity, Urine: 1.012 (ref 1.005–1.030)
pH: 5 (ref 5.0–8.0)

## 2024-05-20 LAB — SURGICAL PCR SCREEN

## 2024-05-20 LAB — CBC
HCT: 41.9 % (ref 36.0–46.0)
Hemoglobin: 14.2 g/dL (ref 12.0–15.0)
MCH: 29.8 pg (ref 26.0–34.0)
MCHC: 33.9 g/dL (ref 30.0–36.0)
MCV: 88 fL (ref 80.0–100.0)
Platelets: 172 K/uL (ref 150–400)
RBC: 4.76 MIL/uL (ref 3.87–5.11)
RDW: 13.5 % (ref 11.5–15.5)
WBC: 9 K/uL (ref 4.0–10.5)
nRBC: 0 % (ref 0.0–0.2)

## 2024-05-20 NOTE — Progress Notes (Signed)
 [name], PA-C with Dr. Addie made aware of moderate Leukocytes in UA results.

## 2024-05-20 NOTE — Progress Notes (Signed)
 PCP - Dr. Tanda Chesley Eke Cardiologist - Dr. Alm Clay - last office visit 12/18/2023 with PA  PPM/ICD - Denies Device Orders - n/a Rep Notified - n/a  Chest x-ray - 08/29/2023 EKG - 08/29/2023 Stress Test - 03/16/2022 ECHO - 12/08/2020 Cardiac Cath - 03/30/2022  Sleep Study - +OSA. Pt wears CPAP nightly. She thinks pressure setting is 4.   No DM  Last dose of GLP1 agonist- n/a GLP1 instructions: n/a  Blood Thinner Instructions: Pt instructed to stop Plavix  5-7 days prior to surgery. Last dose was today, 10/29 Aspirin  Instructions: n/a  ERAS Protcol - Clear liquids until 0830 morning of surgery PRE-SURGERY Ensure or G2- Ensure given to pt with instructions  COVID TEST- n/a   Anesthesia review: Yes. Cardiac clearance (CAD with stents and HTN). Pt endorses sneezing, runny nose and productive cough with white phlegm that started Saturday, 10/25. Assessed by Lynwood Hope, PA-C during appointment who will notify the office of the above symptoms. Pt made aware that surgery may be delayed due to symptoms. Pt understood.  Patient denies shortness of breath, fever, cough and chest pain at PAT appointment.   All instructions explained to the patient, with a verbal understanding of the material. Patient agrees to go over the instructions while at home for a better understanding. Patient also instructed to self quarantine after being tested for COVID-19. The opportunity to ask questions was provided.

## 2024-05-25 ENCOUNTER — Encounter: Payer: Self-pay | Admitting: Radiology

## 2024-05-26 DIAGNOSIS — Z01818 Encounter for other preprocedural examination: Secondary | ICD-10-CM

## 2024-05-27 ENCOUNTER — Encounter: Admitting: Orthopedic Surgery

## 2024-06-10 ENCOUNTER — Encounter: Admitting: Orthopedic Surgery

## 2024-06-17 ENCOUNTER — Encounter (HOSPITAL_COMMUNITY): Payer: Self-pay | Admitting: Orthopedic Surgery

## 2024-06-17 ENCOUNTER — Other Ambulatory Visit: Payer: Self-pay

## 2024-06-17 NOTE — Progress Notes (Signed)
 SDW CALL  Patient was given pre-op instructions over the phone. The opportunity was given for the patient to ask questions. No further questions asked. Patient verbalized understanding of instructions given.   PCP - Loring Tanda Mae, MD  Cardiologist - Anner Alm ORN, MD   PPM/ICD - denies Device Orders - n/a Rep Notified - n/a  Chest x-ray - 08-29-23 EKG - 08-29-23 Stress Test - 03-16-2022 ECHO - 12-08-20 Cardiac Cath - 03-30-22  Sleep Study - + OSA  CPAP - uses nightly  Dm- denies  Blood Thinner Instructions:clopidogrel  (PLAVIX ) Last dose 06-16-2024 Aspirin  Instructions:denies  ERAS Protcol -clear liquids until 7:45 am.   COVID TEST- n/a   Anesthesia review: yes, CAD with stents,   Patient denies shortness of breath, fever, cough and chest pain over the phone call   All instructions explained to the patient, with a verbal understanding of the material. Patient agrees to go over the instructions while at home for a better understanding.

## 2024-06-23 ENCOUNTER — Ambulatory Visit (HOSPITAL_COMMUNITY): Payer: Self-pay | Admitting: Physician Assistant

## 2024-06-23 ENCOUNTER — Other Ambulatory Visit: Payer: Self-pay

## 2024-06-23 ENCOUNTER — Encounter (HOSPITAL_COMMUNITY): Admission: RE | Disposition: A | Payer: Self-pay | Source: Home / Self Care | Attending: Orthopedic Surgery

## 2024-06-23 ENCOUNTER — Encounter (HOSPITAL_COMMUNITY): Payer: Self-pay | Admitting: Orthopedic Surgery

## 2024-06-23 ENCOUNTER — Ambulatory Visit (HOSPITAL_COMMUNITY): Admitting: Anesthesiology

## 2024-06-23 ENCOUNTER — Observation Stay (HOSPITAL_COMMUNITY)
Admission: RE | Admit: 2024-06-23 | Discharge: 2024-06-24 | Disposition: A | Discharge status: Home / Self Care | Attending: Orthopedic Surgery | Admitting: Orthopedic Surgery

## 2024-06-23 DIAGNOSIS — Z96652 Presence of left artificial knee joint: Secondary | ICD-10-CM

## 2024-06-23 DIAGNOSIS — Z955 Presence of coronary angioplasty implant and graft: Secondary | ICD-10-CM | POA: Insufficient documentation

## 2024-06-23 DIAGNOSIS — Z01818 Encounter for other preprocedural examination: Principal | ICD-10-CM

## 2024-06-23 DIAGNOSIS — I129 Hypertensive chronic kidney disease with stage 1 through stage 4 chronic kidney disease, or unspecified chronic kidney disease: Secondary | ICD-10-CM | POA: Insufficient documentation

## 2024-06-23 DIAGNOSIS — M1712 Unilateral primary osteoarthritis, left knee: Secondary | ICD-10-CM | POA: Diagnosis not present

## 2024-06-23 DIAGNOSIS — I10 Essential (primary) hypertension: Secondary | ICD-10-CM | POA: Diagnosis not present

## 2024-06-23 DIAGNOSIS — Z79899 Other long term (current) drug therapy: Secondary | ICD-10-CM | POA: Diagnosis not present

## 2024-06-23 DIAGNOSIS — I2510 Atherosclerotic heart disease of native coronary artery without angina pectoris: Secondary | ICD-10-CM | POA: Insufficient documentation

## 2024-06-23 DIAGNOSIS — Z87891 Personal history of nicotine dependence: Secondary | ICD-10-CM | POA: Insufficient documentation

## 2024-06-23 DIAGNOSIS — N189 Chronic kidney disease, unspecified: Secondary | ICD-10-CM | POA: Insufficient documentation

## 2024-06-23 DIAGNOSIS — M25562 Pain in left knee: Secondary | ICD-10-CM | POA: Diagnosis present

## 2024-06-23 HISTORY — PX: TOTAL KNEE ARTHROPLASTY: SHX125

## 2024-06-23 HISTORY — PX: INJECTION KNEE: SHX2446

## 2024-06-23 LAB — CBC
HCT: 39.2 % (ref 36.0–46.0)
Hemoglobin: 13.3 g/dL (ref 12.0–15.0)
MCH: 30.6 pg (ref 26.0–34.0)
MCHC: 33.9 g/dL (ref 30.0–36.0)
MCV: 90.1 fL (ref 80.0–100.0)
Platelets: 152 K/uL (ref 150–400)
RBC: 4.35 MIL/uL (ref 3.87–5.11)
RDW: 13.5 % (ref 11.5–15.5)
WBC: 5.2 K/uL (ref 4.0–10.5)
nRBC: 0 % (ref 0.0–0.2)

## 2024-06-23 LAB — BASIC METABOLIC PANEL WITH GFR
Anion gap: 9 (ref 5–15)
BUN: 14 mg/dL (ref 8–23)
CO2: 22 mmol/L (ref 22–32)
Calcium: 9.1 mg/dL (ref 8.9–10.3)
Chloride: 106 mmol/L (ref 98–111)
Creatinine, Ser: 0.93 mg/dL (ref 0.44–1.00)
GFR, Estimated: >60 mL/min (ref >60)
Glucose, Bld: 197 mg/dL — ABNORMAL HIGH (ref 70–99)
Potassium: 3 mmol/L — ABNORMAL LOW (ref 3.5–5.1)
Sodium: 137 mmol/L (ref 135–145)

## 2024-06-23 LAB — SURGICAL PCR SCREEN
MRSA, PCR: NEGATIVE
Staphylococcus aureus: NEGATIVE

## 2024-06-23 MED ORDER — PROPOFOL 10 MG/ML IV BOLUS
INTRAVENOUS | Status: AC
  Filled 2024-06-23: qty 20

## 2024-06-23 MED ORDER — FAMOTIDINE 20 MG PO TABS
40.0000 mg | ORAL_TABLET | Freq: Two times a day (BID) | ORAL | Status: DC
  Administered 2024-06-23 – 2024-06-24 (×2): 40 mg via ORAL
  Filled 2024-06-23 (×2): qty 2

## 2024-06-23 MED ORDER — CLOPIDOGREL BISULFATE 75 MG PO TABS
75.0000 mg | ORAL_TABLET | Freq: Every day | ORAL | Status: DC
  Administered 2024-06-24: 75 mg via ORAL
  Filled 2024-06-23: qty 1

## 2024-06-23 MED ORDER — LOSARTAN POTASSIUM-HCTZ 100-25 MG PO TABS: 1.0000 | ORAL_TABLET | Freq: Every day | ORAL | Status: DC

## 2024-06-23 MED ORDER — MIDAZOLAM HCL 2 MG/2ML IJ SOLN
INTRAMUSCULAR | Status: AC
  Filled 2024-06-23: qty 2

## 2024-06-23 MED ORDER — FENTANYL CITRATE (PF) 100 MCG/2ML IJ SOLN
25.0000 ug | INTRAMUSCULAR | Status: DC | PRN
  Administered 2024-06-23 (×3): 50 ug via INTRAVENOUS

## 2024-06-23 MED ORDER — ONDANSETRON HCL 4 MG/2ML IJ SOLN
INTRAMUSCULAR | Status: AC
  Filled 2024-06-23: qty 2

## 2024-06-23 MED ORDER — TRAZODONE HCL 50 MG PO TABS: 50.0000 mg | ORAL_TABLET | Freq: Every evening | ORAL | Status: DC | PRN

## 2024-06-23 MED ORDER — ONDANSETRON HCL 4 MG/2ML IJ SOLN: INTRAMUSCULAR | Status: DC | PRN

## 2024-06-23 MED ORDER — BUPIVACAINE HCL 0.25 % IJ SOLN
INTRAMUSCULAR | Status: DC | PRN
  Administered 2024-06-23: 4 mL

## 2024-06-23 MED ORDER — HYDROMORPHONE HCL 1 MG/ML IJ SOLN
0.2500 mg | INTRAMUSCULAR | Status: DC | PRN
  Administered 2024-06-23: 0.5 mg via INTRAVENOUS

## 2024-06-23 MED ORDER — BUPIVACAINE HCL (PF) 0.5 % IJ SOLN
INTRAMUSCULAR | Status: AC
  Filled 2024-06-23: qty 30

## 2024-06-23 MED ORDER — METHOCARBAMOL 1000 MG/10ML IJ SOLN: 500.0000 mg | Freq: Four times a day (QID) | INTRAMUSCULAR | Status: DC | PRN

## 2024-06-23 MED ORDER — BUPIVACAINE HCL (PF) 0.25 % IJ SOLN
INTRAMUSCULAR | Status: AC
  Filled 2024-06-23: qty 30

## 2024-06-23 MED ORDER — MORPHINE SULFATE (PF) 4 MG/ML IV SOLN
INTRAVENOUS | Status: DC | PRN
  Administered 2024-06-23: 8 mg via INTRAMUSCULAR

## 2024-06-23 MED ORDER — PANTOPRAZOLE SODIUM 40 MG PO TBEC
40.0000 mg | DELAYED_RELEASE_TABLET | Freq: Every day | ORAL | Status: DC
  Administered 2024-06-24: 40 mg via ORAL
  Filled 2024-06-23: qty 1

## 2024-06-23 MED ORDER — ASPIRIN 81 MG PO CHEW
81.0000 mg | CHEWABLE_TABLET | Freq: Two times a day (BID) | ORAL | Status: DC
  Administered 2024-06-24: 81 mg via ORAL
  Filled 2024-06-23 (×2): qty 1

## 2024-06-23 MED ORDER — PHENYLEPHRINE 80 MCG/ML (10ML) SYRINGE FOR IV PUSH (FOR BLOOD PRESSURE SUPPORT)
PREFILLED_SYRINGE | INTRAVENOUS | Status: DC | PRN
  Administered 2024-06-23: 80 ug via INTRAVENOUS

## 2024-06-23 MED ORDER — ROCURONIUM BROMIDE 10 MG/ML (PF) SYRINGE
PREFILLED_SYRINGE | INTRAVENOUS | Status: DC | PRN
  Administered 2024-06-23: 60 mg via INTRAVENOUS

## 2024-06-23 MED ORDER — ROCURONIUM BROMIDE 10 MG/ML (PF) SYRINGE
PREFILLED_SYRINGE | INTRAVENOUS | Status: AC
  Filled 2024-06-23: qty 10

## 2024-06-23 MED ORDER — ACETAMINOPHEN 500 MG PO TABS
1000.0000 mg | ORAL_TABLET | Freq: Four times a day (QID) | ORAL | Status: DC
  Administered 2024-06-23 – 2024-06-24 (×3): 1000 mg via ORAL
  Filled 2024-06-23 (×4): qty 2

## 2024-06-23 MED ORDER — LIDOCAINE 2% (20 MG/ML) 5 ML SYRINGE
INTRAMUSCULAR | Status: AC
  Filled 2024-06-23: qty 5

## 2024-06-23 MED ORDER — 0.9 % SODIUM CHLORIDE (POUR BTL) OPTIME
TOPICAL | Status: DC | PRN
  Administered 2024-06-23: 1000 mL

## 2024-06-23 MED ORDER — DEXAMETHASONE SOD PHOSPHATE PF 10 MG/ML IJ SOLN
INTRAMUSCULAR | Status: DC | PRN
  Administered 2024-06-23: 5 mg via INTRAVENOUS

## 2024-06-23 MED ORDER — ORAL CARE MOUTH RINSE: 15.0000 mL | Freq: Once | OROMUCOSAL | Status: AC

## 2024-06-23 MED ORDER — FENTANYL CITRATE (PF) 100 MCG/2ML IJ SOLN
INTRAMUSCULAR | Status: AC
  Filled 2024-06-23: qty 2

## 2024-06-23 MED ORDER — LACTATED RINGERS IV SOLN: INTRAVENOUS | Status: DC

## 2024-06-23 MED ORDER — SODIUM CHLORIDE 0.9% FLUSH
INTRAVENOUS | Status: DC | PRN
  Administered 2024-06-23: 20 mL via INTRADERMAL

## 2024-06-23 MED ORDER — EPHEDRINE SULFATE-NACL 50-0.9 MG/10ML-% IV SOSY
PREFILLED_SYRINGE | INTRAVENOUS | Status: DC | PRN
  Administered 2024-06-23: 5 mg via INTRAVENOUS

## 2024-06-23 MED ORDER — PHENYLEPHRINE HCL-NACL 20-0.9 MG/250ML-% IV SOLN
INTRAVENOUS | Status: DC | PRN
  Administered 2024-06-23: 30 ug/min via INTRAVENOUS

## 2024-06-23 MED ORDER — PROPOFOL 10 MG/ML IV BOLUS
INTRAVENOUS | Status: DC | PRN
  Administered 2024-06-23: 150 mg via INTRAVENOUS

## 2024-06-23 MED ORDER — ACETAMINOPHEN 500 MG PO TABS
1000.0000 mg | ORAL_TABLET | Freq: Once | ORAL | Status: AC
  Administered 2024-06-23: 1000 mg via ORAL
  Filled 2024-06-23: qty 2

## 2024-06-23 MED ORDER — HYDROMORPHONE HCL 1 MG/ML IJ SOLN
INTRAMUSCULAR | Status: AC
  Filled 2024-06-23: qty 1

## 2024-06-23 MED ORDER — POVIDONE-IODINE 10 % EX SWAB
2.0000 | Freq: Once | CUTANEOUS | Status: AC
  Administered 2024-06-23: 2 via TOPICAL

## 2024-06-23 MED ORDER — DOCUSATE SODIUM 100 MG PO CAPS
100.0000 mg | ORAL_CAPSULE | Freq: Two times a day (BID) | ORAL | Status: DC
  Administered 2024-06-23 – 2024-06-24 (×2): 100 mg via ORAL
  Filled 2024-06-23 (×2): qty 1

## 2024-06-23 MED ORDER — MIDAZOLAM HCL (PF) 2 MG/2ML IJ SOLN: 2.0000 mg | Freq: Once | INTRAMUSCULAR | Status: AC

## 2024-06-23 MED ORDER — TRANEXAMIC ACID-NACL 1000-0.7 MG/100ML-% IV SOLN
INTRAVENOUS | Status: AC
  Filled 2024-06-23: qty 100

## 2024-06-23 MED ORDER — METOCLOPRAMIDE HCL 5 MG/ML IJ SOLN: 5.0000 mg | Freq: Three times a day (TID) | INTRAMUSCULAR | Status: DC | PRN

## 2024-06-23 MED ORDER — TRANEXAMIC ACID 1000 MG/10ML IV SOLN
2000.0000 mg | INTRAVENOUS | Status: DC
  Filled 2024-06-23: qty 20

## 2024-06-23 MED ORDER — POVIDONE-IODINE 7.5 % EX SOLN
Freq: Once | CUTANEOUS | Status: DC
  Filled 2024-06-23: qty 118

## 2024-06-23 MED ORDER — TRANEXAMIC ACID 1000 MG/10ML IV SOLN
INTRAVENOUS | Status: DC | PRN
  Administered 2024-06-23: 2000 mg via TOPICAL

## 2024-06-23 MED ORDER — SODIUM CHLORIDE 0.9 % IR SOLN
Status: DC | PRN
  Administered 2024-06-23: 3000 mL

## 2024-06-23 MED ORDER — MORPHINE SULFATE (PF) 4 MG/ML IV SOLN
INTRAVENOUS | Status: AC
  Filled 2024-06-23: qty 2

## 2024-06-23 MED ORDER — SUCRALFATE 1 G PO TABS
1.0000 g | ORAL_TABLET | Freq: Two times a day (BID) | ORAL | Status: DC
  Administered 2024-06-23 – 2024-06-24 (×2): 1 g via ORAL
  Filled 2024-06-23 (×2): qty 1

## 2024-06-23 MED ORDER — PROPRANOLOL HCL 10 MG PO TABS: 10.0000 mg | ORAL_TABLET | Freq: Every day | ORAL | Status: DC | PRN

## 2024-06-23 MED ORDER — ACETAMINOPHEN 10 MG/ML IV SOLN
INTRAVENOUS | Status: AC
  Filled 2024-06-23: qty 100

## 2024-06-23 MED ORDER — LIDOCAINE 2% (20 MG/ML) 5 ML SYRINGE
INTRAMUSCULAR | Status: DC | PRN
  Administered 2024-06-23: 100 mg via INTRAVENOUS

## 2024-06-23 MED ORDER — VANCOMYCIN HCL IN DEXTROSE 1-5 GM/200ML-% IV SOLN
1000.0000 mg | Freq: Two times a day (BID) | INTRAVENOUS | Status: AC
  Administered 2024-06-23: 1000 mg via INTRAVENOUS
  Filled 2024-06-23: qty 200

## 2024-06-23 MED ORDER — NITROGLYCERIN 0.4 MG SL SUBL: 0.4000 mg | SUBLINGUAL_TABLET | SUBLINGUAL | Status: DC | PRN

## 2024-06-23 MED ORDER — FENTANYL CITRATE (PF) 100 MCG/2ML IJ SOLN: 50.0000 ug | Freq: Once | INTRAMUSCULAR | Status: AC

## 2024-06-23 MED ORDER — KETOROLAC TROMETHAMINE 30 MG/ML IJ SOLN
INTRAMUSCULAR | Status: DC | PRN
  Administered 2024-06-23: 30 mg

## 2024-06-23 MED ORDER — PHENOL 1.4 % MT LIQD: 1.0000 | OROMUCOSAL | Status: DC | PRN

## 2024-06-23 MED ORDER — CLONIDINE HCL (ANALGESIA) 100 MCG/ML EP SOLN
EPIDURAL | Status: DC | PRN
  Administered 2024-06-23: 1 mL via INTRA_ARTICULAR

## 2024-06-23 MED ORDER — VANCOMYCIN HCL IN DEXTROSE 1-5 GM/200ML-% IV SOLN
1000.0000 mg | INTRAVENOUS | Status: AC
  Administered 2024-06-23: 1000 mg via INTRAVENOUS
  Filled 2024-06-23: qty 200

## 2024-06-23 MED ORDER — HYDROCHLOROTHIAZIDE 25 MG PO TABS
25.0000 mg | ORAL_TABLET | Freq: Every day | ORAL | Status: DC
  Administered 2024-06-23: 25 mg via ORAL
  Filled 2024-06-23: qty 1

## 2024-06-23 MED ORDER — MENTHOL 3 MG MT LOZG: 1.0000 | LOZENGE | OROMUCOSAL | Status: DC | PRN

## 2024-06-23 MED ORDER — KETOROLAC TROMETHAMINE 30 MG/ML IJ SOLN
INTRAMUSCULAR | Status: AC
  Filled 2024-06-23: qty 1

## 2024-06-23 MED ORDER — TRANEXAMIC ACID-NACL 1000-0.7 MG/100ML-% IV SOLN
INTRAVENOUS | Status: DC | PRN
  Administered 2024-06-23: 1000 mg via INTRAVENOUS

## 2024-06-23 MED ORDER — CHLORHEXIDINE GLUCONATE 0.12 % MT SOLN
15.0000 mL | Freq: Once | OROMUCOSAL | Status: AC
  Administered 2024-06-23: 15 mL via OROMUCOSAL
  Filled 2024-06-23: qty 15

## 2024-06-23 MED ORDER — ONDANSETRON HCL 4 MG PO TABS: 4.0000 mg | ORAL_TABLET | Freq: Four times a day (QID) | ORAL | Status: DC | PRN

## 2024-06-23 MED ORDER — STERILE WATER FOR IRRIGATION IR SOLN
Status: DC | PRN
  Administered 2024-06-23: 3000 mL

## 2024-06-23 MED ORDER — METHOCARBAMOL 500 MG PO TABS
500.0000 mg | ORAL_TABLET | Freq: Four times a day (QID) | ORAL | Status: DC | PRN
  Administered 2024-06-23 – 2024-06-24 (×2): 500 mg via ORAL
  Filled 2024-06-23 (×2): qty 1

## 2024-06-23 MED ORDER — METOCLOPRAMIDE HCL 5 MG PO TABS: 5.0000 mg | ORAL_TABLET | Freq: Three times a day (TID) | ORAL | Status: DC | PRN

## 2024-06-23 MED ORDER — BUPIVACAINE LIPOSOME 1.3 % IJ SUSP
INTRAMUSCULAR | Status: AC
  Filled 2024-06-23: qty 20

## 2024-06-23 MED ORDER — VANCOMYCIN HCL 1000 MG IV SOLR
INTRAVENOUS | Status: AC
  Filled 2024-06-23: qty 20

## 2024-06-23 MED ORDER — LOSARTAN POTASSIUM 50 MG PO TABS
100.0000 mg | ORAL_TABLET | Freq: Every day | ORAL | Status: DC
  Administered 2024-06-23: 100 mg via ORAL
  Filled 2024-06-23: qty 2

## 2024-06-23 MED ORDER — CLONIDINE HCL (ANALGESIA) 100 MCG/ML EP SOLN
EPIDURAL | Status: AC
  Filled 2024-06-23: qty 10

## 2024-06-23 MED ORDER — SERTRALINE HCL 50 MG PO TABS
50.0000 mg | ORAL_TABLET | Freq: Every day | ORAL | Status: DC
  Administered 2024-06-23 – 2024-06-24 (×2): 50 mg via ORAL
  Filled 2024-06-23 (×2): qty 1

## 2024-06-23 MED ORDER — LEVOFLOXACIN IN D5W 500 MG/100ML IV SOLN
500.0000 mg | INTRAVENOUS | Status: AC
  Administered 2024-06-23: 500 mg via INTRAVENOUS
  Filled 2024-06-23: qty 100

## 2024-06-23 MED ORDER — HYDROMORPHONE HCL 1 MG/ML IJ SOLN: 0.5000 mg | INTRAMUSCULAR | Status: DC | PRN

## 2024-06-23 MED ORDER — LORATADINE 10 MG PO TABS
10.0000 mg | ORAL_TABLET | Freq: Every day | ORAL | Status: DC
  Administered 2024-06-23 – 2024-06-24 (×2): 10 mg via ORAL
  Filled 2024-06-23 (×2): qty 1

## 2024-06-23 MED ORDER — ACETAMINOPHEN 325 MG PO TABS: 325.0000 mg | ORAL_TABLET | Freq: Four times a day (QID) | ORAL | Status: DC | PRN

## 2024-06-23 MED ORDER — ACETAMINOPHEN 10 MG/ML IV SOLN
1000.0000 mg | Freq: Once | INTRAVENOUS | Status: AC
  Administered 2024-06-23: 1000 mg via INTRAVENOUS

## 2024-06-23 MED ORDER — SODIUM CHLORIDE 0.9 % IV SOLN: Freq: Once | INTRAVENOUS | Status: AC

## 2024-06-23 MED ORDER — IRRISEPT - 450ML BOTTLE WITH 0.05% CHG IN STERILE WATER, USP 99.95% OPTIME
TOPICAL | Status: DC | PRN
  Administered 2024-06-23: 450 mL via TOPICAL

## 2024-06-23 MED ORDER — OXYCODONE HCL 5 MG PO TABS
5.0000 mg | ORAL_TABLET | ORAL | Status: DC | PRN
  Administered 2024-06-23 (×2): 10 mg via ORAL
  Administered 2024-06-24 (×2): 5 mg via ORAL
  Filled 2024-06-23: qty 1
  Filled 2024-06-23 (×3): qty 2

## 2024-06-23 MED ORDER — FENTANYL CITRATE (PF) 250 MCG/5ML IJ SOLN
INTRAMUSCULAR | Status: AC
  Filled 2024-06-23: qty 5

## 2024-06-23 MED ORDER — POVIDONE-IODINE 10 % EX SWAB: 2.0000 | Freq: Once | CUTANEOUS | Status: DC

## 2024-06-23 MED ORDER — FENTANYL CITRATE (PF) 250 MCG/5ML IJ SOLN
INTRAMUSCULAR | Status: DC | PRN
  Administered 2024-06-23: 100 ug via INTRAVENOUS
  Administered 2024-06-23: 50 ug via INTRAVENOUS

## 2024-06-23 MED ORDER — VANCOMYCIN HCL 1000 MG IV SOLR
INTRAVENOUS | Status: DC | PRN
  Administered 2024-06-23: 1000 mg via TOPICAL

## 2024-06-23 MED ORDER — ONDANSETRON HCL 4 MG/2ML IJ SOLN: 4.0000 mg | Freq: Once | INTRAMUSCULAR | Status: DC | PRN

## 2024-06-23 MED ORDER — SUGAMMADEX SODIUM 200 MG/2ML IV SOLN
INTRAVENOUS | Status: DC | PRN
  Administered 2024-06-23: 200 mg via INTRAVENOUS

## 2024-06-23 MED ORDER — MIDAZOLAM HCL 2 MG/2ML IJ SOLN
INTRAMUSCULAR | Status: AC
  Administered 2024-06-23: 2 mg via INTRAVENOUS
  Filled 2024-06-23: qty 2

## 2024-06-23 MED ORDER — FENTANYL CITRATE (PF) 100 MCG/2ML IJ SOLN
INTRAMUSCULAR | Status: AC
  Administered 2024-06-23: 50 ug via INTRAVENOUS
  Filled 2024-06-23: qty 2

## 2024-06-23 MED ORDER — ONDANSETRON HCL 4 MG/2ML IJ SOLN
INTRAMUSCULAR | Status: DC | PRN
  Administered 2024-06-23: 4 mg via INTRAVENOUS

## 2024-06-23 SURGICAL SUPPLY — 2 items
NDL 22X1.5 STRL (OR ONLY) (MISCELLANEOUS) ×4 IMPLANT
NDL SPNL 18GX3.5 QUINCKE PK (NEEDLE) ×2 IMPLANT

## 2024-06-23 NOTE — Anesthesia Preprocedure Evaluation (Signed)
 Anesthesia Evaluation  Patient identified by MRN, date of birth, ID band Patient awake    Reviewed: Allergy & Precautions, NPO status , Patient's Chart, lab work & pertinent test results  Airway Mallampati: II  TM Distance: >3 FB Neck ROM: Full    Dental  (+) Teeth Intact, Dental Advisory Given   Pulmonary sleep apnea , former smoker   Pulmonary exam normal breath sounds clear to auscultation       Cardiovascular hypertension, Pt. on medications + CAD and + Cardiac Stents  Normal cardiovascular exam Rhythm:Regular Rate:Normal     Neuro/Psych  Headaches PSYCHIATRIC DISORDERS Anxiety Depression    Arnold-Chiari malformation    GI/Hepatic Neg liver ROS, hiatal hernia, PUD,GERD  Medicated,,S/p gastric bypass    Endo/Other  Obesity   Renal/GU Renal InsufficiencyRenal disease     Musculoskeletal  (+) Arthritis  (left knee osteoarthritis), Osteoarthritis,    Abdominal   Peds  Hematology  (+) Blood dyscrasia (Plavix )   Anesthesia Other Findings Day of surgery medications reviewed with the patient.  Reproductive/Obstetrics                              Anesthesia Physical Anesthesia Plan  ASA: 3  Anesthesia Plan: General   Post-op Pain Management: Regional block* and Tylenol  PO (pre-op)*   Induction: Intravenous  PONV Risk Score and Plan: 3 and Midazolam , Dexamethasone  and Ondansetron   Airway Management Planned: Oral ETT  Additional Equipment:   Intra-op Plan:   Post-operative Plan: Extubation in OR  Informed Consent: I have reviewed the patients History and Physical, chart, labs and discussed the procedure including the risks, benefits and alternatives for the proposed anesthesia with the patient or authorized representative who has indicated his/her understanding and acceptance.     Dental advisory given  Plan Discussed with: CRNA  Anesthesia Plan Comments:          Anesthesia Quick Evaluation

## 2024-06-23 NOTE — Anesthesia Procedure Notes (Signed)
 Procedure Name: Intubation Date/Time: 06/23/2024 11:44 AM  Performed by: Worth Catherene Flores, CRNAPre-anesthesia Checklist: Patient identified, Emergency Drugs available, Suction available and Patient being monitored Patient Re-evaluated:Patient Re-evaluated prior to induction Oxygen Delivery Method: Circle system utilized Preoxygenation: Pre-oxygenation with 100% oxygen Induction Type: IV induction Ventilation: Mask ventilation without difficulty Laryngoscope Size: Mac and 4 Tube type: Oral Tube size: 7.0 mm Number of attempts: 1 Airway Equipment and Method: Stylet and Oral airway Placement Confirmation: ETT inserted through vocal cords under direct vision, positive ETCO2 and breath sounds checked- equal and bilateral Secured at: 21 cm Tube secured with: Tape Dental Injury: Teeth and Oropharynx as per pre-operative assessment

## 2024-06-23 NOTE — Brief Op Note (Signed)
   06/23/2024  2:29 PM  PATIENT:  Carrie Reynolds  68 y.o. female  PRE-OPERATIVE DIAGNOSIS:  left knee osteoarthritis  POST-OPERATIVE DIAGNOSIS:  left knee osteoarthritis  PROCEDURE:  Procedure(s): ARTHROPLASTY, KNEE, TOTAL INJECTION, KNEE  SURGEON:  Surgeon(s): Addie, Cordella Hamilton, MD  ASSISTANT: magnant pa  ANESTHESIA:   general  EBL: 75 ml    Total I/O In: 200 [IV Piggyback:200] Out: 50 [Blood:50]  BLOOD ADMINISTERED: none  DRAINS: none   LOCAL MEDICATIONS USED: Marcaine  morphine  clonidine  Exparel  vancomycin powder  SPECIMEN:  No Specimen  COUNTS:  YES  TOURNIQUET:   Total Tourniquet Time Documented: Thigh (Left) - 57 minutes Total: Thigh (Left) - 57 minutes   DICTATION: .[id number]  PLAN OF CARE: Admit for overnight observation  PATIENT DISPOSITION:  PACU - hemodynamically stable

## 2024-06-23 NOTE — Op Note (Signed)
 NAME: Carrie Reynolds, Carrie Reynolds MEDICAL RECORD NO: [id number] ACCOUNT NO: [id number] DATE OF BIRTH: [birth year] FACILITY: [facility] LOCATION: [facility] PHYSICIAN: Cordella RAMAN. Addie, MD  Operative Report   DATE OF PROCEDURE: 06/23/2024  PREOPERATIVE DIAGNOSIS:  Left knee arthritis.  POSTOPERATIVE DIAGNOSIS:  Left knee arthritis.  PROCEDURE:   1.  Left total knee replacement using press-fit components, size 6 standard femur [employer] with E tibia press-fit, 11 mm medial congruent poly, and 32 mm press-fit patella. 2.  Right knee aspiration and injection of Toradol.  SURGEON:  Cordella RAMAN. Addie, MD  ASSISTANT:  Herlene Calix, PA  INDICATIONS:  The patient is a patient with end-stage knee arthritis in the left knee who presents for operative management after explanation of risks and benefits.  DESCRIPTION OF PROCEDURE:  The patient was brought to the operating room where general anesthetic was induced.  Preoperative antibiotics were administered.  A timeout was called.  The left leg was prescrubbed with alcohol and Betadine allowed to air dry  and prepped with DuraPrep solution draped in a sterile manner.  A timeout was called.  The leg was elevated and exsanguinated with an Esmarch wrap.  Tourniquet was inflated.  An anterior approach to the knee was made.  Skin subcutaneous tissue were  sharply divided.  IrriSept solution was utilized.  A median parapatellar arthrotomy was made and marked with a #1 Vicryl suture.  The fat pad was partially excised.  Severe arthritis is present in both the lateral and patellofemoral compartments.  Next  lateral patellofemoral ligament was released.  Soft tissue removed from the anterior distal femur.  Medial soft tissue dissection was performed proportional to the patient's essentially neutral alignment.  The patella was everted.  The patient has  significant wear in that lateral compartment.  ACL released.  At this time with the collaterals and posterior neurovascular  structures protected intramedullary alignment was used to make a cut on the tibia perpendicular to the mechanical axis.  The cut measured about 3-4 mm off the medial side and about 10 off the  lateral side.  The distal femur was then cut in 5 degrees of valgus using intramedullary alignment, cutting 9 mm off of the distal femur.  The patient had about 2-3 degrees of hyperextension, which is why we decreased the initial distal femur cut.  These  cuts gave a good symmetric extension gap with good stability with a 10 spacer.  The femur was then sized to a size 6 and cut in 3 degrees of external rotation, which gave symmetric flexion and extension gaps.  Bone quality was excellent.  Anterior  posterior and chamfer cuts were made.  A tray was placed on the tibia in alignment with the medial third of the tibial tubercle.  Trial femur was in place.  A 10 mm spacer was placed, which gave about 3-4 degrees of hyperextension.  With an 11 spacer, there was about 0-1 degrees of  hyperextension.  The patella was then cut down from 26 to 15.  A 3-peg patella trial was placed and range of motion was performed.  With the 11 spacer in, the patient had full extension, full flexion with good patella tracking using no-thumbs technique.   Stable to varus-valgus stress at 0, 30, and 90 degrees.  The tray was removed and final preparations were performed on both the femur and the tibia.  At this time, trial components were removed and the knee was irrigated with 3 liters of pulsatile irrigation.  We also irrigated  with IrriSept solution at the  time of the arthrotomy earlier in the case.  Next, the capsule was numbed with a solution of Marcaine , Exparel , and saline.  We then let a TXA sponge along with IrriSept solution sit in the knee for 3 minutes.  This was then removed and the components  were tapped into position with same stability parameters maintained.  The patient had full extension, full flexion, and excellent patella  tracking using no-thumbs technique.  The tourniquet was released at this time.  Bleeding points encountered were  controlled using electrocautery.  Pouring irrigation x3 liters then performed into the knee.  It should also be noted that in the tibia we did irrigate out the tibial canal with IrriSept solution and then placed in some vancomycin powder into the tibial  canal.  Next, the arthrotomy was closed over a bolster using number 1 Vicryl suture.  Prior to final closure, we irrigated the knee with IrriSept and then placed in vancomycin powder and then completed the arthrotomy closure, then injected the knee with  Marcaine , morphine , clonidine  injection.  The IrriSept solution then used again on top of the arthrotomy along with vancomycin powder, and then we closed the skin using 0 Vicryl suture, 2-0 Vicryl suture, and 3-0 Monocryl with Steri-Strips, Aquacel  dressing, Ace wrap, and knee immobilizer applied.  Luke's assistance was required at all times for retraction, opening, closing, and mobilization of tissue.  His assistance was a medical necessity.   PUS D: 06/23/2024 2:38:32 pm T: 06/23/2024 2:59:00 pm  JOB: [id number]/ [id number]

## 2024-06-23 NOTE — Plan of Care (Signed)

## 2024-06-23 NOTE — H&P (Signed)
 TOTAL KNEE ADMISSION H&P  Patient is being admitted for left total knee arthroplasty.  Subjective:  Chief Complaint:left knee pain.  HPI: Carrie Reynolds, 68 y.o. female, has a history of pain and functional disability in the left knee due to arthritis and has failed non-surgical conservative treatments for greater than 12 weeks to includeNSAID's and/or analgesics, corticosteriod injections, viscosupplementation injections, flexibility and strengthening excercises, and activity modification.  Onset of symptoms was gradual, starting >10 years ago with gradually worsening course since that time. The patient noted no past surgery on the left knee(s).  Patient currently rates pain in the left knee(s) at 8 out of 10 with activity. Patient has night pain, worsening of pain with activity and weight bearing, pain that interferes with activities of daily living, pain with passive range of motion, crepitus, and joint swelling.  Patient has evidence of subchondral sclerosis and joint space narrowing by imaging studies. This patient has had a long history of non op rx for bilateral knee sxs.. There is no active infection.  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/2021   Ulcer of small intestine 09/06/2021   Acute blood loss anemia 09/06/2021   Iron deficiency 09/06/2021   Colon cancer screening 09/06/2021   Long term (current) use of antithrombotics/antiplatelets 09/06/2021   Jejunal ulcer 08/21/2021   Syncope and collapse 07/15/2021   Sinus bradycardia 11/03/2019   Atypical angina 10/12/2019   Fatigue 10/12/2019   Presence of 2 overlapping Drug Coated Stents in LAD coronary artery    Coronary  artery disease involving native coronary artery of native heart with angina pectoris 06/17/2019   DOE (dyspnea on exertion) 06/16/2019   CAD S/P DES PCI-proximal LAD 05/20/2019   Agatston coronary artery calcium  score greater than 400 04/16/2019   Migraine with vertigo 07/29/2018   Tinnitus of both ears 07/29/2018   Rapid palpitations 04/16/2017   Systolic murmur 04/16/2017   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   OSA on CPAP 11/19/2013   Hyperlipidemia with target LDL less than 70 07/29/2013   Obesity (BMI 30.0-34.9) 04/25/2012   Depression with anxiety 04/25/2012   Environmental allergies 04/25/2012   Essential (primary) hypertension 04/25/2012   Chiari malformation type I (HCC) 01/03/2012   Postmenopausal 09/18/2011   Past Medical History:  Diagnosis Date   Anxiety    Arnold-Chiari malformation (HCC) 2006   Arthritis    CAD S/P PCI 05/06/2019   05/06/2019: prox LAD (@ SPI) PCI with Resolute Onyx DES 3.5  x 8, Residual 60% PDA, Normal LVF; 07/02/2019: relook Cath for Anterior Ischemia on [place]iew --> CATH 70% pre-stent/proximal edge dissection --> proximal overlapping DES (Resolute Onyx 3.5 x 8 - new & old stent post-dilated to 3.8 mm)   Cervical strain    syndrome   Cervicogenic headache    Chronic kidney disease    Concussion with loss of consciousness 10/09/2017   CTS (carpal tunnel syndrome)    Depression    GERD (gastroesophageal reflux disease)    Glucose intolerance (impaired glucose tolerance)    High coronary artery calcium  score    Score of 1460.  Coronary CTA shows high-grade proximal-mid LAD stenosis (CT FFR 0.50).  Also PDA/PLA 60 to 70% stenosis (CTO FFR 0.75)   History of hiatal hernia  History of kidney stones    Hypertension    Migraine headache    with visual aura   Obesity    OSA (obstructive sleep apnea)    not treated   Postmenopausal    Sleep apnea    Vertigo    post concussive    Past Surgical History:  Procedure Laterality Date   BIOPSY   07/15/2021   Procedure: BIOPSY;  Surgeon: Wilhelmenia Aloha Raddle., MD;  Location: [facility] ENDOSCOPY;  Service: Gastroenterology;;   [name]     right great toe   CHOLECYSTECTOMY N/A 06/05/2018   Procedure: LAPAROSCOPIC CHOLECYSTECTOMY WITH POSSIBLE INTRAOPERATIVE CHOLANGIOGRAM;  Surgeon: Vanderbilt Ned, MD;  Location: [facility] OR;  Service: General;  Laterality: N/A;   CORONARY STENT INTERVENTION N/A 05/12/2019   Procedure: CORONARY STENT INTERVENTION;  Surgeon: Anner Alm ORN, MD;  Location: [facility] INVASIVE CV LAB;  Service: Cardiovascular;;; Culprit-p-mLAD 95% 950% SP1) --> Scoring Balloon PTCA -> DES PCI (Resolute DES 3.5 x 38 - 3.8 mm; 0% LAD& 55% SP1). -->  SP1 was somewhat jailed with TIMI II flow post PCI.   CORONARY STENT INTERVENTION N/A 07/02/2019   Procedure: CORONARY STENT INTERVENTION;  Surgeon: Anner Alm ORN, MD;  Location: [facility] INVASIVE CV LAB;  Service: Cardiovascular;; * 70% Proximal stent edge dissection (edge of previous stent that is patent) -->  successfully covered with additional RESOLUTE ONYX DES 3.5 mm x 8 mm overlapping proximal edge of previous stent (postdilated to 3.8 mm)   ENTEROSCOPY N/A 07/15/2021   Procedure: ENTEROSCOPY;  Surgeon: Wilhelmenia Aloha Raddle., MD;  Location: [facility] ENDOSCOPY;  Service: Gastroenterology;  Laterality: N/A;   GASTRIC BYPASS  11/2015   [facility]   GYNECOLOGIC CRYOSURGERY     HAND SURGERY     HIATAL HERNIA REPAIR  01/01/2024   in [place]    LEFT HEART CATH AND CORONARY ANGIOGRAPHY N/A 05/12/2019   Procedure: LEFT HEART CATH AND CORONARY ANGIOGRAPHY;  Surgeon: Anner Alm ORN, MD;  Location: [facility] INVASIVE CV LAB;  Service: Cardiovascular;;; Culprit-p-mLAD 95% 950% SP1) --> DES PCI.;  RPAV 60% -small caliber vessel, did not appear flow-limiting (medical management).  EF 55 to 60%.  Normal LVEDP.   LEFT HEART CATH AND CORONARY ANGIOGRAPHY N/A 07/02/2019   Procedure: LEFT HEART CATH AND CORONARY ANGIOGRAPHY;  Surgeon: Anner Alm ORN, MD;  Location:  [facility] INVASIVE CV LAB;  Service: Cardiovascular;; CULPRIT = ~70% proximal edge dissection of previous stent (DES PCI overlapping stent placed) - jails SP1 w/ ~60-70% ostial stenosis.SABRA mLAD 25%. RI 30%, RPAV 60%.   LEFT HEART CATH AND CORONARY ANGIOGRAPHY N/A 03/30/2022   Procedure: LEFT HEART CATH AND CORONARY ANGIOGRAPHY;  Surgeon: Wendel Lurena POUR, MD;  Location: [facility] INVASIVE CV LAB;  Service: Cardiovascular;  Laterality: N/A;   NM MYOVIEW  LTD  06/24/2019   (Lexiscan ): HIGH RISK.  EF 55%.  Large size, moderate severe defect in the anterior wall with associated anterior hypokinesis.  Suspect LAD territory.   NM MYOVIEW  LTD  10/23/2019   Normal EF 55 to 60%.  No EKG changes.  Small size mild severity fixed defect in the apical wall consistent with breast attenuation versus small prior infarct.  Marked improvement from anterior apical perfusion abnormality seen in February 2020.   TRANSTHORACIC ECHOCARDIOGRAM  12/08/2020   Normal LV Fxn - EF 60-65%. No RWMA. Mod Asymmetric Basal Septa LVH with mild Concentric LVH. Gr II DD. Normal Longitudinal Strain. Normal RV size & fxn - normal RAP/CVP.  Normal MV. Mild AoV sclerosis w/ stenosis. Mild Ascending  Ao dilation - ~38 mm (borderline).  => No change from 04/2017.   TUBAL LIGATION Bilateral    UPPER GASTROINTESTINAL ENDOSCOPY     VAGINAL HYSTERECTOMY  2003   with LSO and right salpingectomy; right ovary still present   [facility] MONITOR-14-day  01/2021   Predominant rhythm sinus-heart rate range 40 to 124 bpm.  Average 70 bpm.  Rare PVCs and isolated PACs noted.  PAC couplets and triplets also noted as well as trigeminy.  3 atrial runs of 4-5 beats ranging from 102 to 150 bpm.  (2 of 3 noted on patient trigger.  Remainder patient triggers were sinus rhythm with PACs or PVCs); no atrial fibrillation or other sustained arrhythmias noted.    Current Facility-Administered Medications  Medication Dose Route Frequency Provider Last Rate Last Admin   lactated  ringers  infusion   Intravenous Continuous Corinne Garnette BRAVO, MD 10 mL/hr at 06/23/24 0915 New Bag at 06/23/24 0915   levofloxacin (LEVAQUIN) IVPB 500 mg  500 mg Intravenous On Call to OR Magnant, Charles L, PA-C       povidone-iodine (BETADINE) 7.5 % scrub   Topical Once Magnant, Charles L, PA-C       povidone-iodine 10 % swab 2 Application  2 Application Topical Once Magnant, Charles L, PA-C       tranexamic acid (CYKLOKAPRON) 2,000 mg in sodium chloride  0.9 % 50 mL Topical Application  2,000 mg Topical To OR Magnant, Charles L, PA-C       Allergies  Allergen Reactions   Ceclor [Cefaclor] Hives and Rash   Tetracyclines & Related Hives   Aspirin  Other (See Comments)    Patient reports she does not take -hx of stomach ulcer 06/2021   Lipitor [Atorvastatin Calcium ] Cough   Penicillins Swelling and Other (See Comments)    Has patient had a PCN reaction causing immediate rash, facial/tongue/throat swelling, SOB or lightheadedness with hypotension: No Has patient had a PCN reaction causing severe rash involving mucus membranes or skin necrosis: No Has patient had a PCN reaction that required hospitalization: Yes Has patient had a PCN reaction occurring within the last 10 years: No If all of the above answers are NO, then may proceed with Cephalosporin use.   Amoxicillin Rash   Ampicillin Rash   Lisinopril Cough   Simvastatin Cough    Social History   Tobacco Use   Smoking status: Former    Current packs/day: 0.00    Average packs/day: 0.1 packs/day for 30.0 years (3.0 ttl pk-yrs)    Types: Cigarettes    Start date: 04/26/1982    Quit date: 04/26/2012    Years since quitting: 12.1   Smokeless tobacco: Never  Substance Use Topics   Alcohol use: Yes    Comment: occasional    Family History  Problem Relation Age of Onset   Hypertension Mother    Lung cancer Father    Alzheimer's disease Father    Migraines Father    Hyperlipidemia Sister    Hypertension Sister    Colon cancer  Maternal Grandmother    Heart failure Maternal Grandfather    Alzheimer's disease Paternal Grandmother    Lung cancer Paternal Grandfather    Esophageal cancer Neg Hx    Inflammatory bowel disease Neg Hx    Liver disease Neg Hx    Pancreatic cancer Neg Hx    Rectal cancer Neg Hx    Stomach cancer Neg Hx      Review of Systems  Musculoskeletal:  Positive for  arthralgias.  All other systems reviewed and are negative.   Objective:  Physical Exam Vitals reviewed.  HENT:     Head: Normocephalic.     Nose: Nose normal.     Mouth/Throat:     Mouth: Mucous membranes are moist.  Eyes:     Pupils: Pupils are equal, round, and reactive to light.  Cardiovascular:     Rate and Rhythm: Normal rate.     Pulses: Normal pulses.  Pulmonary:     Effort: Pulmonary effort is normal.  Abdominal:     General: Abdomen is flat.  Musculoskeletal:     Cervical back: Normal range of motion.  Skin:    General: Skin is warm.     Capillary Refill: Capillary refill takes less than 2 seconds.  Neurological:     General: No focal deficit present.     Mental Status: She is alert.  Psychiatric:        Mood and Affect: Mood normal.   Left knee exam demonstrates no effusion with good range of motion.  Slight valgus alignment.  Pedal pulses palpable.  Ankle dorsiflexion intact.  Skin intact in the left knee region.  Right knee also has good range of motion but some trace effusion.  Extensor mechanism intact bilaterally.  Collateral and cruciate ligaments are also stable bilaterally.  No groin pain with internal or external rotation of that left or right leg.  Vital signs in last 24 hours: Temp:  [98.8 F (37.1 C)] 98.8 F (37.1 C) (12/02 0842) Pulse Rate:  [74] 74 (12/02 0842) Resp:  [18] 18 (12/02 0842) BP: (139)/(80) 139/80 (12/02 0842) SpO2:  [96 %] 96 % (12/02 0842) Weight:  [95.3 kg] 95.3 kg (12/02 0842)  Labs:   Estimated body mass index is 33.89 kg/m as calculated from the following:    Height as of this encounter: 5' 6 (1.676 m).   Weight as of this encounter: 95.3 kg.   Imaging Review Plain radiographs demonstrate severe degenerative joint disease of the left knee(s). The overall alignment ismild valgus. The bone quality appears to be good for age and reported activity level.      Assessment/Plan:  End stage arthritis, left knee   The patient history, physical examination, clinical judgment of the provider and imaging studies are consistent with end stage degenerative joint disease of the left knee(s) and total knee arthroplasty is deemed medically necessary. The treatment options including medical management, injection therapy arthroscopy and arthroplasty were discussed at length. The risks and benefits of total knee arthroplasty were presented and reviewed. The risks due to aseptic loosening, infection, stiffness, patella tracking problems, thromboembolic complications and other imponderables were discussed. The patient acknowledged the explanation, agreed to proceed with the plan and consent was signed. Patient is being admitted for inpatient treatment for surgery, pain control, PT, OT, prophylactic antibiotics, VTE prophylaxis, progressive ambulation and ADL's and discharge planning. The patient is planning to be discharged home with home health services we will also plan to do right knee aspiration and injection with Toradol because of some continuing symptoms in the right knee.     Patient's anticipated LOS is less than 2 midnights, meeting these requirements: - Younger than 21 - Lives within 1 hour of care - Has a competent adult at home to recover with post-op recover - NO history of  - Chronic pain requiring opiods  - Diabetes  - Coronary Artery Disease  - Heart failure  - Heart attack  - Stroke  -  DVT/VTE  - Cardiac arrhythmia  - Respiratory Failure/COPD  - Renal failure  - Anemia  - Advanced Liver disease

## 2024-06-23 NOTE — Progress Notes (Signed)
 Orthopedic Tech Progress Note Patient Details:  Carrie Reynolds October 01, 1955 [id number]  CPM Left Knee CPM Left Knee: On Left Knee Flexion (Degrees): 10 Left Knee Extension (Degrees): 40  Post Interventions Patient Tolerated: Fair Instructions Provided: Adjustment of device, Care of device Left Bone Foam at pt bedside. Thersia FALCON Shaymus Eveleth 06/23/2024, 7:11 PM

## 2024-06-23 NOTE — Transfer of Care (Signed)
 Immediate Anesthesia Transfer of Care Note  Patient: Carrie Reynolds  Procedure(s) Performed: ARTHROPLASTY, KNEE, TOTAL (Left: Knee) INJECTION, KNEE (Right: Knee)  Patient Location: PACU  Anesthesia Type:General  Level of Consciousness: awake and drowsy  Airway & Oxygen Therapy: Patient Spontanous Breathing and Patient connected to face mask oxygen  Post-op Assessment: Report given to RN and Post -op Vital signs reviewed and stable  Post vital signs: Reviewed and stable  Last Vitals:  Vitals Value Taken Time  BP 106/59 06/23/24 14:12  Temp    Pulse 73 06/23/24 14:15  Resp 13 06/23/24 14:15  SpO2 93 % 06/23/24 14:15  Vitals shown include unfiled device data.  Last Pain:  Vitals:   06/23/24 0912  TempSrc:   PainSc: 1       Patients Stated Pain Goal: 1 (06/23/24 0912)  Complications: No notable events documented.

## 2024-06-24 ENCOUNTER — Encounter (HOSPITAL_COMMUNITY): Payer: Self-pay | Admitting: Orthopedic Surgery

## 2024-06-24 DIAGNOSIS — M1712 Unilateral primary osteoarthritis, left knee: Secondary | ICD-10-CM | POA: Diagnosis not present

## 2024-06-24 MED ORDER — OXYCODONE HCL 5 MG PO TABS
5.0000 mg | ORAL_TABLET | Freq: Once | ORAL | Status: AC
  Administered 2024-06-24: 5 mg via ORAL
  Filled 2024-06-24: qty 1

## 2024-06-24 MED ORDER — SODIUM CHLORIDE 0.9 % IV BOLUS
500.0000 mL | Freq: Once | INTRAVENOUS | Status: AC
  Administered 2024-06-24: 500 mL via INTRAVENOUS

## 2024-06-24 MED ORDER — OXYCODONE HCL 5 MG PO TABS: 5.0000 mg | ORAL_TABLET | ORAL | 0 refills | Status: DC | PRN

## 2024-06-24 MED ORDER — POTASSIUM CHLORIDE CRYS ER 20 MEQ PO TBCR
20.0000 meq | EXTENDED_RELEASE_TABLET | Freq: Two times a day (BID) | ORAL | Status: DC
  Administered 2024-06-24: 20 meq via ORAL
  Filled 2024-06-24: qty 1

## 2024-06-24 MED ORDER — METHOCARBAMOL 500 MG PO TABS: 500.0000 mg | ORAL_TABLET | Freq: Four times a day (QID) | ORAL | 0 refills | Status: AC | PRN

## 2024-06-24 MED ORDER — DOCUSATE SODIUM 100 MG PO CAPS: 100.0000 mg | ORAL_CAPSULE | Freq: Two times a day (BID) | ORAL | 0 refills | Status: AC

## 2024-06-24 NOTE — Care Management Obs Status (Signed)
 MEDICARE OBSERVATION STATUS NOTIFICATION   Patient Details  Name: Carrie Reynolds MRN: [id number] Date of Birth: Mar 03, 1956   Medicare Observation Status Notification Given:  Yes    Bridget Cordella Simmonds, LCSW 06/24/2024, 1:47 PM

## 2024-06-24 NOTE — Progress Notes (Signed)
 Physical Therapy Treatment Patient Details Name: Carrie Reynolds MRN: [id number] DOB: Feb 24, 1956 Today's Date: 06/24/2024   History of Present Illness 68 y.o. female admitted 06/23/24 for same day elective L TKA, R knee aspiration and Toradol injection. PMH includes HTN, CAD, HLD, chronic sinus bradycardia, OSA on CPAP, gastric bypass (2017), migraine with aura, vertigo, anxiety, depression.   PT Comments  Pt progressing with mobility, tolerating hallway ambulation with RW and stair training at supervision-level; asymptomatic with mobility. All education reviewed; provided husband with education over phone per pt request. Pt reports no further questions or concerns, hopeful for d/c home today.   supine BP 102/61 standing BP 115/58 standing 2-min BP 111/77 post-ambulation BP 117/63      If plan is discharge home, recommend the following: A little help with bathing/dressing/bathroom;Assistance with cooking/housework;Assist for transportation;Help with stairs or ramp for entrance   Can travel by private vehicle      Yes  Equipment Recommendations  Rolling walker (2 wheels);BSC/3in1    Recommendations for Other Services       Precautions / Restrictions Precautions Precautions: Knee;Fall Precaution Booklet Issued: Yes (comment) Recall of Precautions/Restrictions: Intact Restrictions Weight Bearing Restrictions Per Provider Order: Yes LLE Weight Bearing Per Provider Order: Weight bearing as tolerated     Mobility  Bed Mobility Overal bed mobility: Modified Independent             General bed mobility comments: mod indep supine<>sit with HOB partially elevated    Transfers Overall transfer level: Needs assistance Equipment used: Rolling walker (2 wheels) Transfers: Sit to/from Stand Sit to Stand: Supervision           General transfer comment: multiple sit<>stands from EOB and recliner to RW, initial cues for hand placement with good carryover, supervision for  safety    Ambulation/Gait Ambulation/Gait assistance: Supervision Gait Distance (Feet): 300 Feet Assistive device: Rolling walker (2 wheels) Gait Pattern/deviations: Step-through pattern, Decreased stride length, Antalgic Gait velocity: Decreased     General Gait Details: antalgic gait with RW and supervision for safety; cues for RLE heel to toe and step through gait pattern   Stairs Stairs: Yes Stairs assistance: Supervision Stair Management: One rail Left, Step to pattern, Forwards Number of Stairs: 4 General stair comments: ascend/descend 2 steps x2 with BUE L-rail support, step to pattern; cues for sequencing, no overt instability and good ability to clear step with R foot   Wheelchair Mobility     Tilt Bed    Modified Rankin (Stroke Patients Only)       Balance Overall balance assessment: Needs assistance Sitting-balance support: No upper extremity supported, Feet supported Sitting balance-Leahy Scale: Good Sitting balance - Comments: performing pericare on toilet without assist   Standing balance support: No upper extremity supported, During functional activity Standing balance-Leahy Scale: Fair Standing balance comment: can stand without UE support; static and dynamic stability improved with RW                            Communication Communication Communication: No apparent difficulties  Cognition Arousal: Alert Behavior During Therapy: WFL for tasks assessed/performed   PT - Cognitive impairments: No apparent impairments                       PT - Cognition Comments: some decreased attention noted, pt attributes to meds Following commands: Intact      Cueing Cueing Techniques: Verbal cues  Exercises Other Exercises Other  Exercises: TKR HEP handout provided and reviewed    General Comments General comments (skin integrity, edema, etc.): pt notes improvement in symptoms; supine BP 102/61, standing BP 115/58, standing 2 min BP  111/77, post-ambulation BP 117/63. reviewed education, pt reports no further questions or concerns; spoke with pt's husband on phone to review education. pt reports feeling confident for d/c home today; RN and CM notified      Pertinent Vitals/Pain Pain Assessment Pain Assessment: Faces Faces Pain Scale: Hurts a little bit Pain Location: L knee (still some numbness post-op) Pain Descriptors / Indicators: Discomfort Pain Intervention(s): Monitored during session, Limited activity within patient's tolerance, Premedicated before session    Home Living Family/patient expects to be discharged to:: Private residence Living Arrangements: Spouse/significant other Available Help at Discharge: Family;Available 24 hours/day Type of Home: House Home Access: Stairs to enter Entrance Stairs-Rails: Lawyer of Steps: 8 into home; split level with 8 up to bedroom   Home Layout: Multi-level Home Equipment: Cane - single point;Grab bars - tub/shower Additional Comments: husband available for initial assist    Prior Function            PT Goals (current goals can now be found in the care plan section) Acute Rehab PT Goals Patient Stated Goal: return home PT Goal Formulation: With patient Time For Goal Achievement: 07/08/24 Potential to Achieve Goals: Good Progress towards PT goals: Progressing toward goals    Frequency    7X/week      PT Plan      Co-evaluation              AM-PAC PT 6 Clicks Mobility   Outcome Measure  Help needed turning from your back to your side while in a flat bed without using bedrails?: None Help needed moving from lying on your back to sitting on the side of a flat bed without using bedrails?: None Help needed moving to and from a bed to a chair (including a wheelchair)?: A Little Help needed standing up from a chair using your arms (e.g., wheelchair or bedside chair)?: A Little Help needed to walk in hospital room?: A  Little Help needed climbing 3-5 steps with a railing? : A Little 6 Click Score: 20    End of Session Equipment Utilized During Treatment: Gait belt Activity Tolerance: Patient tolerated treatment well Patient left: in bed;with call bell/phone within reach;with bed alarm set Nurse Communication: Mobility status PT Visit Diagnosis: Other abnormalities of gait and mobility (R26.89);Muscle weakness (generalized) (M62.81);Pain Pain - Right/Left: Left Pain - part of body: Knee     Time: 1003-1046 PT Time Calculation (min) (ACUTE ONLY): 43 min  Charges:    $Gait Training: 23-37 mins $Therapeutic Activity: 8-22 mins PT General Charges $$ ACUTE PT VISIT: 1 Visit                     Darice Almas, PT, DPT Acute Rehabilitation Services  Personal: Secure Chat Rehab Office: [phone]  Darice LITTIE Almas 06/24/2024, 2:15 PM

## 2024-06-24 NOTE — Progress Notes (Signed)
  Subjective: Carrie Reynolds is a 68 y.o. female s/p left TKA.  They are POD 1.  Pt's pain is controlled.  Pt denies any complain of chest pain, shortness of breath, abdominal pain, calf pain.  Patient denies any fevers or chills.  Was able to work with physical therapy this morning but was limited due to dizziness.  Objective: Vital signs in last 24 hours: Temp:  [97.5 F (36.4 C)-98.5 F (36.9 C)] 98.5 F (36.9 C) (12/03 0814) Pulse Rate:  [64-77] 70 (12/03 0814) Resp:  [11-18] 14 (12/03 0814) BP: (97-141)/(47-79) 104/55 (12/03 0814) SpO2:  [90 %-99 %] 94 % (12/03 0814)  Intake/Output from previous day: 12/02 0701 - 12/03 0700 In: 1070 [P.O.:720; I.V.:150; IV Piggyback:200] Out: 805 [Urine:755; Blood:50] Intake/Output this shift: No intake/output data recorded.  Exam:  No gross blood or drainage overlying the dressing 2+ DP pulse Sensation intact distally in the operative foot Able to dorsiflex and plantarflex the operative foot No calf tenderness.  Negative Homans' sign. Able to perform straight leg raise quite easily   Labs: Recent Labs    06/23/24 0905  HGB 13.3   Recent Labs    06/23/24 0905  WBC 5.2  RBC 4.35  HCT 39.2  PLT 152   Recent Labs    06/23/24 0905  NA 137  K 3.0*  CL 106  CO2 22  BUN 14  CREATININE 0.93  GLUCOSE 197*  CALCIUM  9.1   No results for input(s): LABPT, INR in the last 72 hours.  Assessment/Plan: Pt is POD 1 s/p TKA.    -Plan to discharge to home today or tomorrow pending patient's pain and PT eval.  Has 8 stairs to get into her house  -WBAT with a walker  -Follow-up with Dr. Addie in clinic 2 weeks postoperatively    [facility] 06/24/2024, 11:02 AM

## 2024-06-24 NOTE — Progress Notes (Signed)
 Chaplain responded to a consult request for Advance Directive education. Chaplain introduced spiritual care and offered support in the setting of inpatient admission. Pt was accompanied by her partner and wanted to take time to review the documents prior to completion.   Chaplain provided the following ACP education.  Chaplain provided the Advance Directive packet as well as education on Advance Directives-documents an individual completes to communicate their health care directions in advance of a time when they may need them. Chaplain informed pt the documents which may be completed here in the hospital are the Living Will and Health Care Power of [place].   Chaplain informed that the Health Care Power of [place] is a legal document in which an individual names another person, their Health Care Agent, to make health care decisions when the individual is not able to make them for themselves. The Health Care Agent's function can be temporary or permanent depending on the pt's ability to make and communicate those decisions independently. Chaplain informed pt in the absence of a Health Care Power of Attorney, the state of [place]  directs health care providers to look to the following individuals in the order listed: legal guardian; an attorney?in?fact under a general power of attorney (POA) if that POA includes the right to make health care decisions; a husband or wife; a majority of parents and adult children; a majority of adult brothers and sisters; or an individual who has an established relationship with you, who is acting in good faith and who can convey your wishes.  If none of these person are available or willing to make medical decisions on a patient's behalf, the law allows the patient's doctor to make decisions for them as long as another doctor agrees with those decisions.  Chaplain also informed the patient that the Health Care agent has no decision-making authority over any affairs  other than those related to his or her medical care.   The chaplain further educated the pt that a Living Will is a legal document that allows an individual to state his or her desire not to receive life-prolonging measures in the event that they have a condition that is incurable and will result in their death in a short period of time; they are unconscious, and doctors are confident that they will not regain consciousness; and/or they have advanced dementia or other substantial and irreversible loss of mental function. The chaplain informed pt that life-prolonging measures are medical treatments that would only serve to postpone death, including breathing machines, kidney dialysis, antibiotics, artificial nutrition and hydration (tube feeding), and similar forms of treatment and that if an individual is able to express their wishes, they may also make them known without the use of a Living Will, but in the event that an individual is not able to express their wishes themselves, a Living Will allows medical providers and the pt's family and friends ensure that they are not making decisions on the pt's behalf, but rather serving as the pt's voice to convey decisions the pt has already made.   The patient is aware that the decision to create an advance directive is theirs alone and they may chose not to complete the documents or may chose to complete one portion or both.  The patient was informed that they can revoke the documents at any time by striking through them and writing void or by completing new documents, but that it is also advisable that the individual verbally notify interested parties that their wishes have  changed.  They are also aware that the document must be signed in the presence of a notary public and two witnesses and that this can be done while the patient is still admitted to the hospital or after discharge in the community. If they decide to complete Advance Directives after being  discharged from the hospital, they have been advised to notify all interested parties and to provide those documents to their physicians and loved ones in addition to bringing them to the hospital in the event of another hospitalization.   The chaplain informed the pt that if they desire to proceed with completing Advance Directive Documentation while they are still admitted, notary services are typically available at [facility] between the hours of 1:00 and 3:30 Monday-Thursday.    When the patient is ready to have these documents completed, the patient should request that their nurse place a spiritual care consult and indicate that the patient is ready to have their advance directives notarized so that arrangements for witnesses and notary public can be made.  Please page spiritual care if the patient desires further education or has questions.       Alan HERO. Davee Lomax, M.Div. [facility] Chaplain Pager [phone] Office [phone]     06/24/24 1301  Spiritual Encounters  Type of Visit Initial  Care provided to: Patient;Family  Conversation partners present during encounter Nurse  Referral source Patient request  Reason for visit Advance directives  Advance Directives (For Healthcare)  Does Patient Have a Medical Advance Directive? No  Would patient like information on creating a medical advance directive? Yes (Inpatient - patient defers creating a medical advance directive at this time - Information given)

## 2024-06-24 NOTE — Progress Notes (Signed)
  Subjective: Pt stable - one episode lbp this am did well with pt and stairs since - bp meds held   Objective: Vital signs in last 24 hours: Temp:  [97.5 F (36.4 C)-98.5 F (36.9 C)] 98.5 F (36.9 C) (12/03 0814) Pulse Rate:  [64-77] 70 (12/03 0814) Resp:  [11-18] 14 (12/03 0814) BP: (97-141)/(47-79) 104/55 (12/03 0814) SpO2:  [90 %-99 %] 94 % (12/03 0814)  Intake/Output from previous day: 12/02 0701 - 12/03 0700 In: 1070 [P.O.:720; I.V.:150; IV Piggyback:200] Out: 805 [Urine:755; Blood:50] Intake/Output this shift: No intake/output data recorded.  Exam:  Sensation intact distally Intact pulses distally Dorsiflexion/Plantar flexion intact  Labs: Recent Labs    06/23/24 0905  HGB 13.3   Recent Labs    06/23/24 0905  WBC 5.2  RBC 4.35  HCT 39.2  PLT 152   Recent Labs    06/23/24 0905  NA 137  K 3.0*  CL 106  CO2 22  BUN 14  CREATININE 0.93  GLUCOSE 197*  CALCIUM  9.1   No results for input(s): LABPT, INR in the last 72 hours.  Assessment/Plan: Plan dc to home Hold bp meds til Friday Restart plavix  Thursday Cannot take asa due to ulcers   Carrie Reynolds 06/24/2024, 12:37 PM

## 2024-06-24 NOTE — Evaluation (Signed)
 Physical Therapy Evaluation Patient Details Name: Carrie Reynolds MRN: [id number] DOB: [birth year] Today's Date: 06/24/2024  History of Present Illness  68 y.o. female admitted 06/23/24 for same day elective L TKA, R knee aspiration and Toradol injection. PMH includes HTN, CAD, HLD, chronic sinus bradycardia, OSA on CPAP, gastric bypass (2017), migraine with aura, vertigo, anxiety, depression.   Clinical Impression  Pt presents with an overall decrease in functional mobility secondary to above. PTA, pt indep, retired, drives, lives with supportive husband. Educ re: precautions, positioning, therex/AROM, and importance of mobility. Today, pt able to initiate transfer and gait training with RW, intermittent CGA; pt moving very well, limited by c/o wooziness with hallway ambulation with BP 104/55. Pt would benefit from continued acute PT services to maximize functional mobility and independence prior to d/c home.     If plan is discharge home, recommend the following: A little help with bathing/dressing/bathroom;Assistance with cooking/housework;Assist for transportation;Help with stairs or ramp for entrance   Can travel by private vehicle    Yes    Equipment Recommendations Rolling walker (2 wheels);BSC/3in1  Recommendations for Other Services       Functional Status Assessment Patient has had a recent decline in their functional status and demonstrates the ability to make significant improvements in function in a reasonable and predictable amount of time.     Precautions / Restrictions Precautions Precautions: Knee;Fall Precaution Booklet Issued: Yes (comment) Recall of Precautions/Restrictions: Intact Restrictions Weight Bearing Restrictions Per Provider Order: Yes LLE Weight Bearing Per Provider Order: Weight bearing as tolerated      Mobility  Bed Mobility Overal bed mobility: Modified Independent             General bed mobility comments: use of bed rail, HOB  elevated    Transfers Overall transfer level: Needs assistance Equipment used: Rolling walker (2 wheels) Transfers: Sit to/from Stand Sit to Stand: Contact guard assist           General transfer comment: sit<>stands to RW from EOB, BSC (over toilet) and recliner, cues for hand placement and sequencing; CGA for safety    Ambulation/Gait Ambulation/Gait assistance: Contact guard assist Gait Distance (Feet): 100 Feet Assistive device: Rolling walker (2 wheels) Gait Pattern/deviations: Step-to pattern, Step-through pattern, Decreased stride length, Decreased weight shift to left, Trunk flexed, Antalgic Gait velocity: Decreased     General Gait Details: slow, antalgic gait with RW and intermittent CGA for balance; pt taking steps to recliner, seated rest break then walking to/from bathroom, additional hallway ambulation; further distance limited secondary to c/o dizziness/wooziness  Stairs            Wheelchair Mobility     Tilt Bed    Modified Rankin (Stroke Patients Only)       Balance Overall balance assessment: Needs assistance Sitting-balance support: No upper extremity supported, Feet supported Sitting balance-Leahy Scale: Good Sitting balance - Comments: performing pericare on toilet without assist   Standing balance support: No upper extremity supported, During functional activity Standing balance-Leahy Scale: Fair Standing balance comment: can stand without UE support; static and dynamic stability improved with RW                             Pertinent Vitals/Pain Pain Assessment Pain Assessment: Faces Faces Pain Scale: Hurts a little bit Pain Location: L knee (still numb post-op) Pain Descriptors / Indicators: Discomfort Pain Intervention(s): Monitored during session, Limited activity within patient's tolerance    Home  Living Family/patient expects to be discharged to:: Private residence Living Arrangements: Spouse/significant  other Available Help at Discharge: Family;Available 24 hours/day Type of Home: House Home Access: Stairs to enter Entrance Stairs-Rails: Lawyer of Steps: 8 into home; split level with 8 up to bedroom   Home Layout: Multi-level Home Equipment: Cane - single point;Grab bars - tub/shower Additional Comments: husband available for initial assist    Prior Function Prior Level of Function : Independent/Modified Independent;Driving             Mobility Comments: independent without DME; retired hotel manager ADLs Comments: independent     Extremity/Trunk Assessment   Upper Extremity Assessment Upper Extremity Assessment: Overall WFL for tasks assessed    Lower Extremity Assessment Lower Extremity Assessment: LLE deficits/detail LLE Deficits / Details: s/p L TKA; achieving ~90' L knee flex, full extension; fair quad activation, partial SLR    Cervical / Trunk Assessment Cervical / Trunk Assessment: Normal  Communication   Communication Communication: No apparent difficulties    Cognition Arousal: Alert Behavior During Therapy: WFL for tasks assessed/performed   PT - Cognitive impairments: No apparent impairments                       PT - Cognition Comments: some decreased attention noted, pt attributes to meds Following commands: Intact       Cueing Cueing Techniques: Verbal cues     General Comments General comments (skin integrity, edema, etc.): pt c/o dizziness with ambulation, seated BP 104/55. initiated educ re: role of acute PT, POC, precautions, positioning, edema control, DVT prevention, activity recommendations, therex/AROM, importance of mobility, pulmonary hygiene (pulling ~1076mL on IS), potential d/c needs    Exercises Other Exercises Other Exercises: seated L LAQ, seated L knee flex with overpressure to stretch   Assessment/Plan    PT Assessment Patient needs continued PT services  PT Problem List Decreased  strength;Decreased range of motion;Decreased activity tolerance;Decreased balance;Decreased mobility;Decreased knowledge of use of DME;Decreased knowledge of precautions;Pain       PT Treatment Interventions DME instruction;Gait training;Stair training;Functional mobility training;Therapeutic activities;Therapeutic exercise;Balance training;Patient/family education    PT Goals (Current goals can be found in the Care Plan section)  Acute Rehab PT Goals Patient Stated Goal: return home PT Goal Formulation: With patient Time For Goal Achievement: 07/08/24 Potential to Achieve Goals: Good    Frequency 7X/week     Co-evaluation               AM-PAC PT 6 Clicks Mobility  Outcome Measure Help needed turning from your back to your side while in a flat bed without using bedrails?: None Help needed moving from lying on your back to sitting on the side of a flat bed without using bedrails?: None Help needed moving to and from a bed to a chair (including a wheelchair)?: A Little Help needed standing up from a chair using your arms (e.g., wheelchair or bedside chair)?: A Little Help needed to walk in hospital room?: A Little Help needed climbing 3-5 steps with a railing? : A Little 6 Click Score: 20    End of Session Equipment Utilized During Treatment: Gait belt Activity Tolerance: Patient tolerated treatment well Patient left: in chair;with call bell/phone within reach;with chair alarm set Nurse Communication: Mobility status PT Visit Diagnosis: Other abnormalities of gait and mobility (R26.89);Muscle weakness (generalized) (M62.81);Pain Pain - Right/Left: Left Pain - part of body: Knee    Time: 9256-9184 PT Time Calculation (min) (ACUTE ONLY): 32  min   Charges:   PT Evaluation $PT Eval Low Complexity: 1 Low PT Treatments $Therapeutic Activity: 8-22 mins PT General Charges $$ ACUTE PT VISIT: 1 Visit       Darice Almas, PT, DPT Acute Rehabilitation Services   Personal: Secure Chat Rehab Office: [phone]  Darice LITTIE Almas 06/24/2024, 11:24 AM

## 2024-06-24 NOTE — Anesthesia Postprocedure Evaluation (Signed)
 Anesthesia Post Note  Patient: Carrie Reynolds  Procedure(s) Performed: ARTHROPLASTY, KNEE, TOTAL (Left: Knee) INJECTION, KNEE (Right: Knee)     Patient location during evaluation: PACU Anesthesia Type: General Level of consciousness: awake and alert Pain management: pain level controlled Vital Signs Assessment: post-procedure vital signs reviewed and stable Respiratory status: spontaneous breathing, nonlabored ventilation and respiratory function stable Cardiovascular status: blood pressure returned to baseline and stable Postop Assessment: no apparent nausea or vomiting Anesthetic complications: no   No notable events documented.  Last Vitals:    Last Pain:                 Garnette FORBES [place]

## 2024-06-25 ENCOUNTER — Emergency Department (HOSPITAL_COMMUNITY)

## 2024-06-25 ENCOUNTER — Encounter (HOSPITAL_COMMUNITY): Payer: Self-pay

## 2024-06-25 ENCOUNTER — Other Ambulatory Visit: Payer: Self-pay

## 2024-06-25 ENCOUNTER — Emergency Department (HOSPITAL_COMMUNITY)
Admission: EM | Admit: 2024-06-25 | Discharge: 2024-06-25 | Disposition: A | Discharge status: Home / Self Care | Attending: Emergency Medicine | Admitting: Emergency Medicine

## 2024-06-25 DIAGNOSIS — M25562 Pain in left knee: Secondary | ICD-10-CM

## 2024-06-25 MED ORDER — OXYCODONE HCL 5 MG PO TABS
5.0000 mg | ORAL_TABLET | Freq: Once | ORAL | Status: DC
  Filled 2024-06-25: qty 1

## 2024-06-25 MED ORDER — HYDROMORPHONE HCL 2 MG PO TABS: 2.0000 mg | ORAL_TABLET | Freq: Four times a day (QID) | ORAL | 0 refills | Status: DC | PRN

## 2024-06-25 MED ORDER — KETOROLAC TROMETHAMINE 15 MG/ML IJ SOLN
15.0000 mg | Freq: Once | INTRAMUSCULAR | Status: AC
  Administered 2024-06-25: 15 mg via INTRAVENOUS
  Filled 2024-06-25: qty 1

## 2024-06-25 MED ORDER — MORPHINE SULFATE (PF) 4 MG/ML IV SOLN
8.0000 mg | Freq: Once | INTRAVENOUS | Status: AC
  Administered 2024-06-25: 8 mg via INTRAMUSCULAR
  Filled 2024-06-25: qty 2

## 2024-06-25 MED ORDER — HYDROMORPHONE HCL 2 MG PO TABS: 2.0000 mg | ORAL_TABLET | ORAL | 0 refills | Status: DC | PRN

## 2024-06-25 MED ORDER — OXYCODONE HCL 5 MG PO TABS
10.0000 mg | ORAL_TABLET | Freq: Once | ORAL | Status: AC
  Administered 2024-06-25: 10 mg via ORAL
  Filled 2024-06-25: qty 2

## 2024-06-25 NOTE — Progress Notes (Signed)
 Patient seen in emergency department.  She is POD 2 s/p left total knee arthroplasty by Dr. Addie.  Was doing very well yesterday but when she got home after working with physical therapy yesterday, she had severe increase in pain that did not respond to oxycodone .  She is here today for pain control.  She has had radiographs demonstrating total knee arthroplasty without any complication.  She has minimal effusion on exam today.  Incision looks to be healing well without evidence of infection or dehiscence.  No evidence of DVT with no calf tenderness and negative Homans' sign.  Palpable DP pulse.  Intact ankle dorsiflexion and plantarflexion.  Cannot perform straight leg raise which is consistent with her increased postoperative pain compared with yesterday.  Plan this morning is aspiration of the minimal effusion and injection with mixture of Marcaine  and morphine  in order to help with some postop pain control.  Aspirated 1 cc of sanguinous fluid without any evidence of purulence and then an injection consisting of 10 cc of quarter percent plain Marcaine  along with 8 mg of morphine  was injected into the knee.  Plan to switch her medications from oral oxycodone  to oral Dilaudid.  I can send prescriptions in for [facility] and she and her family will keep us  posted by contacting the office if pain continues to be uncontrolled or we need to switch medications.

## 2024-06-25 NOTE — Discharge Instructions (Addendum)
 Evaluation of your left knee was overall reassuring.  It appears to be healing nicely.  I sent a few tablets of Dilaudid  for pain.  Please do not take oxycodone  while you are taking Dilaudid .  Please follow-up with orthopedics.  If there is increased pain, redness or swelling around the left knee or oozing pus or bleeding or any other concerning symptom please return to the ED for further evaluation.

## 2024-06-25 NOTE — ED Triage Notes (Signed)
 Pt BIB [facility] from home c/o of left knee pain. Patient had total knee replacement on 06/23/2024 and was d/c from the hospital yesterday. Left knee nerve block wore off prior to d/c. Prescribed meds have not controled pain.   EMS gave 200 mcg of Fentanyl  en route  153/81 (99)  80 HR  93% RA  13 RR   99.2 temp oral   20 R AC

## 2024-06-25 NOTE — ED Provider Notes (Signed)
 [place] [facility]GENCY DEPARTMENT AT [facility] Provider Note   CSN: [id number] Arrival date & time: 06/25/24  9665     Patient presents with: Post-op Problem and Knee Pain  HPI Carrie Reynolds is a 68 y.o. female s/p left knee replacement POD 2 presenting for left knee pain.  Denies any new trauma to the knee.  States the pain has been unbearable at home.  She is taking Tylenol , oxycodone  and Robaxin .  She states she has been ambulating on the knee for the last day.  Denies any notable oozing or bleeding.  Denies fever. Denies CP or shortness of breath.  Has been taking 10 mg of oxycodone  at a time and she stated that her PCP had recommended increasing to 15 mg.    Knee Pain      Prior to Admission medications   Medication Sig Start Date End Date Taking? Authorizing Provider  HYDROmorphone  (DILAUDID ) 2 MG tablet Take 1 tablet (2 mg total) by mouth every 6 (six) hours as needed for up to 8 doses for severe pain (pain score 7-10) or moderate pain (pain score 4-6). 06/25/24  Yes Kingsten Enfield K, PA-C  acetaminophen  (TYLENOL ) 500 MG tablet Take 1,000 mg by mouth every 6 (six) hours as needed for mild pain, moderate pain or headache.    [provider]  clopidogrel  (PLAVIX ) 75 MG tablet TAKE 1 TABLET BY MOUTH EVERY DAY 11/29/20   Anner Alm ORN, MD  docusate sodium  (COLACE) 100 MG capsule Take 1 capsule (100 mg total) by mouth 2 (two) times daily. 06/24/24   Addie Cordella Hamilton, MD  losartan -hydrochlorothiazide  (HYZAAR) 100-25 MG tablet Take 1 tablet by mouth daily. 04/01/24   Lucien Orren SAILOR, PA-C  methocarbamol  (ROBAXIN ) 500 MG tablet Take 1 tablet (500 mg total) by mouth every 6 (six) hours as needed for muscle spasms. 06/24/24   Addie Cordella Hamilton, MD  nitroGLYCERIN  (NITROSTAT ) 0.4 MG SL tablet Place 1 tablet (0.4 mg total) under the tongue every 5 (five) minutes as needed for chest pain. 12/18/23   Janene Boer, PA  oxyCODONE  (OXY IR/ROXICODONE ) 5 MG immediate release  tablet Take 1-2 tablets (5-10 mg total) by mouth every 4 (four) hours as needed for moderate pain (pain score 4-6) (pain score 4-6). 06/24/24   Addie Cordella Hamilton, MD  pantoprazole  (PROTONIX ) 40 MG tablet Take 40 mg by mouth daily before breakfast. 02/03/24   [provider]  progesterone (PROMETRIUM) 100 MG capsule Take 100 mg by mouth at bedtime.    [provider]  propranolol  (INDERAL ) 10 MG tablet May take  1 to 2  tablets a day  as needed for palpiptation 08/21/21   Cindie Delon POUR, PA-C  rosuvastatin  (CRESTOR ) 40 MG tablet Take 1 tablet (40 mg total) by mouth daily. 04/01/24   Lucien Orren SAILOR, PA-C  sertraline  (ZOLOFT ) 100 MG tablet Take 50 mg by mouth daily.    [provider]  sucralfate  (CARAFATE ) 1 g tablet TAKE 1 TABLET (1 G TOTAL) BY MOUTH 4 TIMES A DAY WITH MEALS AND AT BEDTIME Patient taking differently: Take 1 g by mouth 2 (two) times daily. 09/26/22   Mansouraty, Gabriel Jr., MD  terbinafine (LAMISIL) 250 MG tablet Take 250 mg by mouth daily. 05/14/24   [provider]  traZODone  (DESYREL ) 50 MG tablet Take 50 mg by mouth at bedtime as needed for sleep. 02/05/24   [provider]    Allergies: Ceclor [cefaclor], Tetracyclines & related, Aspirin , Lipitor [atorvastatin calcium ],  Penicillins, Amoxicillin, Ampicillin, Lisinopril, and Simvastatin    Review of Systems See HPI   Physical Exam   Vitals:   06/25/24 0345 06/25/24 0430  BP: (!) 173/79   Pulse: 79 74  Resp: 17 18  Temp:    SpO2: 92% 95%    CONSTITUTIONAL:  well-appearing, NAD NEURO:  Alert and oriented x 3, CN 3-12 grossly intact EYES:  eyes equal and reactive ENT/NECK:  Supple, no stridor  CARDIO:  regular rate and rhythm, appears well-perfused, DP pulses are 2+ bilaterally PULM:  No respiratory distress, CTAB GI/GU:  non-distended, soft MSK/SPINE:  No gross deformities, no edema, moves all extremities  SKIN: Surgical incision site noted at the left knee.  This  well-approximated and well endothelialized.  No dehiscence.  No notable erythema or edema.  It is warm but not hot to touch.  No oozing or bleeding noted.   *Additional and/or pertinent findings included in MDM below    (all labs ordered are listed, but only abnormal results are displayed) Labs Reviewed - No data to display  EKG: None  Radiology: DG Knee Complete 4 Views Left Result Date: 06/25/2024 EXAM: 4 OR MORE VIEW(S) XRAY OF THE LEFT KNEE 06/25/2024 05:15:00 AM COMPARISON: None available. CLINICAL HISTORY: knee pain FINDINGS: BONES AND JOINTS: Total knee arthroplasty noted. Intact orthopedic hardware without periprosthetic fracture or lucency. Likely knee joint effusion. SOFT TISSUES: Soft tissue edema. Vascular calcifications. IMPRESSION: 1. Likely knee joint effusion. 2. Soft tissue edema. 3. Total knee arthroplasty with intact orthopedic hardware without periprosthetic fracture or lucency. Electronically signed by: Waddell Calk MD 06/25/2024 05:25 AM EST RP Workstation: [id number]     Procedures   Medications Ordered in the ED  ketorolac  (TORADOL ) 15 MG/ML injection 15 mg (has no administration in time range)  oxyCODONE  (Oxy IR/ROXICODONE ) immediate release tablet 10 mg (10 mg Oral Given 06/25/24 0509)                                    Medical Decision Making Amount and/or Complexity of Data Reviewed Radiology: ordered.  Risk Prescription drug management.   68 year old well-appearing female presenting for left knee pain after her knee replacement 2 days ago.  Exam was largely reassuring without evidence of infection.  Overall appears to be healing nicely.  Per chart review, knee replacement performed by Dr. Cordella Hutchinson.  I called the on-call orthopedic surgeon Dr. Jerri and he advised this is likely postoperative pain.  Applied new dressing.  Recommended 8 tablets of Dilaudid  and follow-up in clinic. Advised to discontinue the oxycodone  and start the Dilaudid .  Discussed  this plan with patient and she was agreeable.  Discussed return precautions.  Discharged.     Final diagnoses:  Left knee pain, unspecified chronicity    ED Discharge Orders          Ordered    HYDROmorphone  (DILAUDID ) 2 MG tablet  Every 6 hours PRN        06/25/24 0620               Yvan Dority K, PA-C 06/25/24 9375    Palumbo, April, MD 06/25/24 0629    Lang Norleen POUR, PA-C 06/26/24 9485    Palumbo, April, MD 06/29/24 2324

## 2024-06-25 NOTE — ED Provider Notes (Incomplete Revision)
 [place] [facility]ENC[facility] DEPARTMENT AT [facility] Provider Note   CSN: [id number] Arrival date & time: 06/25/24  9665     Patient presents with: Post-op Problem and Knee Pain  HPI Carrie Reynolds is a 68 y.o. female s/p left knee replacement POD 2 presenting for left knee pain.  Denies any new trauma to the knee.  States the pain has been unbearable at home.  She is taking Tylenol , oxycodone  and Robaxin.  She states she has been ambulating on the knee for the last day.  Denies any notable oozing or bleeding.  Denies fever. Denies CP or shortness of breath.  Has been taking 10 mg of oxycodone  at a time and she stated that her PCP had recommended increasing to 15 mg.    Knee Pain      Prior to Admission medications   Medication Sig Start Date End Date Taking? Authorizing Provider  HYDROmorphone (DILAUDID) 2 MG tablet Take 1 tablet (2 mg total) by mouth every 6 (six) hours as needed for up to 8 doses for severe pain (pain score 7-10) or moderate pain (pain score 4-6). 06/25/24  Yes Milianna Ericsson K, PA-C  acetaminophen  (TYLENOL ) 500 MG tablet Take 1,000 mg by mouth every 6 (six) hours as needed for mild pain, moderate pain or headache.    [provider]  clopidogrel  (PLAVIX ) 75 MG tablet TAKE 1 TABLET BY MOUTH EVERY DAY 11/29/20   Anner Alm ORN, MD  docusate sodium (COLACE) 100 MG capsule Take 1 capsule (100 mg total) by mouth 2 (two) times daily. 06/24/24   Addie Cordella Hamilton, MD  losartan -hydrochlorothiazide  (HYZAAR) 100-25 MG tablet Take 1 tablet by mouth daily. 04/01/24   Lucien Orren SAILOR, PA-C  methocarbamol (ROBAXIN) 500 MG tablet Take 1 tablet (500 mg total) by mouth every 6 (six) hours as needed for muscle spasms. 06/24/24   Addie Cordella Hamilton, MD  nitroGLYCERIN  (NITROSTAT ) 0.4 MG SL tablet Place 1 tablet (0.4 mg total) under the tongue every 5 (five) minutes as needed for chest pain. 12/18/23   Janene Boer, PA  oxyCODONE  (OXY IR/ROXICODONE ) 5 MG immediate release  tablet Take 1-2 tablets (5-10 mg total) by mouth every 4 (four) hours as needed for moderate pain (pain score 4-6) (pain score 4-6). 06/24/24   Addie Cordella Hamilton, MD  pantoprazole  (PROTONIX ) 40 MG tablet Take 40 mg by mouth daily before breakfast. 02/03/24   [provider]  progesterone (PROMETRIUM) 100 MG capsule Take 100 mg by mouth at bedtime.    [provider]  propranolol  (INDERAL ) 10 MG tablet May take  1 to 2  tablets a day  as needed for palpiptation 08/21/21   Cindie Delon POUR, PA-C  rosuvastatin  (CRESTOR ) 40 MG tablet Take 1 tablet (40 mg total) by mouth daily. 04/01/24   Lucien Orren SAILOR, PA-C  sertraline  (ZOLOFT ) 100 MG tablet Take 50 mg by mouth daily.    [provider]  sucralfate  (CARAFATE ) 1 g tablet TAKE 1 TABLET (1 G TOTAL) BY MOUTH 4 TIMES A DAY WITH MEALS AND AT BEDTIME Patient taking differently: Take 1 g by mouth 2 (two) times daily. 09/26/22   Mansouraty, Gabriel Jr., MD  terbinafine (LAMISIL) 250 MG tablet Take 250 mg by mouth daily. 05/14/24   [provider]  traZODone (DESYREL) 50 MG tablet Take 50 mg by mouth at bedtime as needed for sleep. 02/05/24   [provider]    Allergies: Ceclor [cefaclor], Tetracyclines & related, Aspirin , Lipitor [atorvastatin calcium ],  Penicillins, Amoxicillin, Ampicillin, Lisinopril, and Simvastatin    Review of Systems See HPI   Physical Exam   Vitals:   06/25/24 0345 06/25/24 0430  BP: (!) 173/79   Pulse: 79 74  Resp: 17 18  Temp:    SpO2: 92% 95%    CONSTITUTIONAL:  well-appearing, NAD NEURO:  Alert and oriented x 3, CN 3-12 grossly intact EYES:  eyes equal and reactive ENT/NECK:  Supple, no stridor  CARDIO:  regular rate and rhythm, appears well-perfused, DP pulses are 2+ bilaterally PULM:  No respiratory distress, CTAB GI/GU:  non-distended, soft MSK/SPINE:  No gross deformities, no edema, moves all extremities  SKIN: Surgical incision site noted at the left knee.  This  well-approximated and well endothelialized.  No dehiscence.  No notable erythema or edema.  It is warm but not hot to touch.  No oozing or bleeding noted.   *Additional and/or pertinent findings included in MDM below    (all labs ordered are listed, but only abnormal results are displayed) Labs Reviewed - No data to display  EKG: None  Radiology: DG Knee Complete 4 Views Left Result Date: 06/25/2024 EXAM: 4 OR MORE VIEW(S) XRAY OF THE LEFT KNEE 06/25/2024 05:15:00 AM COMPARISON: None available. CLINICAL HISTORY: knee pain FINDINGS: BONES AND JOINTS: Total knee arthroplasty noted. Intact orthopedic hardware without periprosthetic fracture or lucency. Likely knee joint effusion. SOFT TISSUES: Soft tissue edema. Vascular calcifications. IMPRESSION: 1. Likely knee joint effusion. 2. Soft tissue edema. 3. Total knee arthroplasty with intact orthopedic hardware without periprosthetic fracture or lucency. Electronically signed by: Waddell Calk MD 06/25/2024 05:25 AM EST RP Workstation: [id number]     Procedures   Medications Ordered in the ED  ketorolac (TORADOL) 15 MG/ML injection 15 mg (has no administration in time range)  oxyCODONE  (Oxy IR/ROXICODONE ) immediate release tablet 10 mg (10 mg Oral Given 06/25/24 0509)                                    Medical Decision Making Amount and/or Complexity of Data Reviewed Radiology: ordered.  Risk Prescription drug management.   68 year old well-appearing female presenting for left knee pain after her knee replacement 2 days ago.  Exam was largely reassuring without evidence of infection.  Overall appears to be healing nicely.  Per chart review, knee replacement performed by Dr. Cordella Hutchinson.  I called the on-call orthopedic surgeon Dr. Jerri and he advised this is likely postoperative pain.  Applied new dressing.  Recommended 8 tablets of Dilaudid and follow-up in clinic. Advised to discontinue the oxycodone  and start the Dilaudid.  Discussed  this plan with patient and she was agreeable.  Discussed return precautions.  Discharged.     Final diagnoses:  Left knee pain, unspecified chronicity    ED Discharge Orders          Ordered    HYDROmorphone (DILAUDID) 2 MG tablet  Every 6 hours PRN        06/25/24 0620               Bridney Guadarrama K, PA-C 06/25/24 9375    Palumbo, April, MD 06/25/24 [phone]

## 2024-06-28 DIAGNOSIS — M1712 Unilateral primary osteoarthritis, left knee: Secondary | ICD-10-CM

## 2024-06-29 ENCOUNTER — Encounter: Payer: Self-pay | Admitting: Orthopedic Surgery

## 2024-06-29 ENCOUNTER — Other Ambulatory Visit: Payer: Self-pay | Admitting: Orthopedic Surgery

## 2024-06-29 MED ORDER — GABAPENTIN 100 MG PO CAPS: 100.0000 mg | ORAL_CAPSULE | Freq: Three times a day (TID) | ORAL | 0 refills | Status: AC

## 2024-06-29 NOTE — Telephone Encounter (Signed)
 Meds sent

## 2024-06-30 ENCOUNTER — Telehealth: Payer: Self-pay | Admitting: Orthopedic Surgery

## 2024-06-30 ENCOUNTER — Other Ambulatory Visit: Payer: Self-pay | Admitting: Orthopedic Surgery

## 2024-06-30 MED ORDER — HYDROMORPHONE HCL 2 MG PO TABS: 2.0000 mg | ORAL_TABLET | ORAL | 0 refills | Status: DC | PRN

## 2024-06-30 NOTE — Telephone Encounter (Signed)
 Carrie Reynolds I refilled dil but did not see that she was on oxy

## 2024-06-30 NOTE — Telephone Encounter (Signed)
 Pt called saying that they are out of their pain medication. Medication is OxyContin  and she doesn't know the name of the other one other than it starts with Dep. Pharmacy is [facility] on [address]. Call back number is [phone]

## 2024-06-30 NOTE — Telephone Encounter (Signed)
 Pt called saying that she tried to go off her pain meds, but isn't able to and she needs a refill. Pharmacy is [facility] on [address]. Call back number is [phone].

## 2024-07-01 ENCOUNTER — Telehealth: Payer: Self-pay | Admitting: Orthopedic Surgery

## 2024-07-01 NOTE — Telephone Encounter (Signed)
 Looks like she wanted oxy too? Is she to take the dilaudid  or oxy?

## 2024-07-01 NOTE — Telephone Encounter (Signed)
 I talked to the pt. She is scared of the dilaudid  and her pain is getting under control with the oxy and tylenol . She would rather take oxy. She did pick up the dilaudid  but said she will take it back to the pharmacy. Please advise

## 2024-07-01 NOTE — Telephone Encounter (Signed)
 This was handled via pt portal

## 2024-07-01 NOTE — Telephone Encounter (Signed)
 It looks like Dr. Addie refilled Dilaudid  which was more helpful than oxycodone  was since oxycodone  did not do anything for her and required her to be readmitted to the ER for pain control.  He sent in Dilaudid  yesterday and that is all she should really take from an opioid standpoint.  She does not need any oxycodone  in addition.

## 2024-07-01 NOTE — Telephone Encounter (Signed)
 Pt called and said that she is confused about her medication that was sent. Now the pharmacy will not fill it. She don't know why. CB#[phone]

## 2024-07-02 NOTE — Telephone Encounter (Signed)
 I am not sure how it works if she takes her back to the pharmacy in terms of being able to send a new opioid prescription in.  Insurance may not cover it because of the timeline.  If she has not taken it back yet, she could just break the Dilaudid  into half tablets and take those and I think that would make it more manageable for her.  If she has already taken it back, I will send in a prescription for oxycodone  and we can see if it gets covered by her insurance.  Thanks for your help Autumn

## 2024-07-02 NOTE — Telephone Encounter (Signed)
 I talked to the pt. She will just stick with 1/2 dilaudid  for now. She is concerned because her therapist said she isnt where she is supposed to be as far as motion. I told her to keep working at it but not to push it and cause her pain to spike again. She stated understanding

## 2024-07-06 ENCOUNTER — Telehealth: Payer: Self-pay | Admitting: Orthopedic Surgery

## 2024-07-06 NOTE — Telephone Encounter (Signed)
 Jerel (PT) from [facility] called stating pt refused services and knee from last week can only move knee 45 degree. Jerel secure number is [phone].

## 2024-07-08 ENCOUNTER — Other Ambulatory Visit (INDEPENDENT_AMBULATORY_CARE_PROVIDER_SITE_OTHER)

## 2024-07-08 ENCOUNTER — Telehealth: Payer: Self-pay

## 2024-07-08 ENCOUNTER — Ambulatory Visit: Admitting: Orthopedic Surgery

## 2024-07-08 DIAGNOSIS — Z96652 Presence of left artificial knee joint: Secondary | ICD-10-CM | POA: Diagnosis not present

## 2024-07-08 MED ORDER — ROPIVACAINE HCL 5 MG/ML IJ SOLN
INTRAMUSCULAR | Status: DC | PRN
  Administered 2024-06-23: 11:00:00 20 mL via PERINEURAL

## 2024-07-08 NOTE — Addendum Note (Signed)
 Addendum  created 07/08/24 2129 by Corinne Garnette BRAVO, MD   Child order released for a procedure order, Clinical Note Signed, Intraprocedure Blocks edited, Intraprocedure Meds edited, SmartForm saved

## 2024-07-08 NOTE — Anesthesia Procedure Notes (Signed)
 Anesthesia Regional Block: Adductor canal block   Pre-Anesthetic Checklist: , timeout performed,  Correct Patient, Correct Site, Correct Laterality,  Correct Procedure, Correct Position, site marked,  Risks and benefits discussed,  Surgical consent,  Pre-op evaluation,  At surgeon's request and post-op pain management  Laterality: Left  Prep: chloraprep       Needles:  Injection technique: Single-shot  Needle Type: Echogenic Needle     Needle Length: 9cm  Needle Gauge: 21     Additional Needles:   Procedures:,,,, ultrasound used (permanent image in chart),,    Narrative:  Start time: 06/23/2024 10:56 AM End time: 06/23/2024 11:04 AM Injection made incrementally with aspirations every 5 mL.  Performed by: Personally  Anesthesiologist: Corinne Garnette BRAVO, MD  Additional Notes: No pain on injection. No increased resistance to injection. Injection made in 5cc increments.  Good needle visualization.  Patient tolerated procedure well.

## 2024-07-08 NOTE — Telephone Encounter (Signed)
 Reminder for THURSDAY to  send in rf of dilaudid  2mg  1 po q 8 hrs prn pain #30 per OV today

## 2024-07-09 ENCOUNTER — Encounter: Payer: Self-pay | Admitting: Orthopedic Surgery

## 2024-07-09 ENCOUNTER — Other Ambulatory Visit: Payer: Self-pay | Admitting: Orthopedic Surgery

## 2024-07-09 MED ORDER — HYDROMORPHONE HCL 2 MG PO TABS: 2.0000 mg | ORAL_TABLET | ORAL | 0 refills | Status: DC | PRN

## 2024-07-09 NOTE — Telephone Encounter (Signed)
Meds sent thx

## 2024-07-09 NOTE — Progress Notes (Signed)
 Post-Op Visit Note   Patient: Carrie Reynolds           Date of Birth: 1955-09-18           MRN: [id number] Visit Date: 07/08/2024 PCP: Carrie Tanda Mae, MD   Assessment & Plan:  Chief Complaint:  Chief Complaint  Patient presents with   Left Knee - Routine Post Op    LEFT TKA  (surgery date 06-23-24)    Visit Diagnoses:  1. Status post total left knee replacement     Plan: [name] is now 2 weeks out left total knee replacement.  60 degrees on CPM.  Having more pain than she expected.  On Dilaudid  for pain management.  On exam has range of motion of 5 to 60 degrees.  No calf tenderness negative Homans.  Incision is intact.  Radiographs look good.  The motion she does have is easy motion so I think she will be able to continue to progress to achieving 90 degrees of flexion by 4 weeks postop.  Dilaudid  refilled.  Physical therapy upstairs 3 times a week for 6 weeks for primarily range of motion over strengthening.  Prescription for rolling walker provided as the rolling walker she received at the hospital was not functional.  Knee immobilizer provided at this time as she cannot quite yet do enough straight leg raises to be able to have enough quad control to walk without support.  Follow-Up Instructions: No follow-ups on file.   Orders:  Orders Placed This Encounter  Procedures   XR Knee 1-2 Views Left   Ambulatory referral to Physical Therapy   No orders of the defined types were placed in this encounter.   Imaging: XR Knee 1-2 Views Left Result Date: 07/09/2024 AP lateral radiographs left knee reviewed.  Press-fit total knee replacement in good position alignment with no complicating features   PMFS History: Patient Active Problem List   Diagnosis Date Noted   Arthritis of left knee 06/28/2024   S/P total knee replacement, left 06/23/2024   Healthcare maintenance 09/14/2023   Recurrent major depressive episodes, moderate (HCC) 02/07/2023   Migraine with aura,  not intractable, without status migrainosus 02/07/2023   Post-traumatic stress disorder, chronic 02/07/2023   Bloating symptom 09/08/2021   Gastrojejunal ulceration 09/06/2021   Ulcer of small intestine 09/06/2021   Acute blood loss anemia 09/06/2021   Iron deficiency 09/06/2021   Colon cancer screening 09/06/2021   Long term (current) use of antithrombotics/antiplatelets 09/06/2021   Jejunal ulcer 08/21/2021   Syncope and collapse 07/15/2021   Sinus bradycardia 11/03/2019   Atypical angina 10/12/2019   Fatigue 10/12/2019   Presence of 2 overlapping Drug Coated Stents in LAD coronary artery    Coronary artery disease involving native coronary artery of native heart with angina pectoris 06/17/2019   DOE (dyspnea on exertion) 06/16/2019   CAD S/P DES PCI-proximal LAD 05/20/2019   Agatston coronary artery calcium  score greater than 400 04/16/2019   Migraine with vertigo 07/29/2018   Tinnitus of both ears 07/29/2018   Rapid palpitations 04/16/2017   Systolic murmur 04/16/2017   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   OSA on CPAP 11/19/2013   Hyperlipidemia with target LDL less than 70 07/29/2013   Obesity (BMI 30.0-34.9) 04/25/2012   Depression with anxiety 04/25/2012   Environmental allergies 04/25/2012   Essential (primary) hypertension 04/25/2012   Chiari malformation type I (HCC) 01/03/2012   Postmenopausal 09/18/2011   Past Medical History:  Diagnosis Date   Anxiety  Arnold-Chiari malformation (HCC) 2006   Arthritis    CAD S/P PCI 05/06/2019   05/06/2019: prox LAD (@ SPI) PCI with Resolute Onyx DES 3.5  x 8, Residual 60% PDA, Normal LVF; 07/02/2019: relook Cath for Anterior Ischemia on Mhoview --> CATH 70% pre-stent/proximal edge dissection --> proximal overlapping DES (Resolute Onyx 3.5 x 8 - new & old stent post-dilated to 3.8 mm)   Cervical strain    syndrome   Cervicogenic headache    Chronic kidney disease    Concussion with loss of consciousness 10/09/2017   CTS (carpal  tunnel syndrome)    Depression    GERD (gastroesophageal reflux disease)    Glucose intolerance (impaired glucose tolerance)    High coronary artery calcium  score    Score of 1460.  Coronary CTA shows high-grade proximal-mid LAD stenosis (CT FFR 0.50).  Also PDA/PLA 60 to 70% stenosis (CTO FFR 0.75)   History of hiatal hernia    History of kidney stones    Hypertension    Migraine headache    with visual aura   Obesity    OSA (obstructive sleep apnea)    not treated   Postmenopausal    Sleep apnea    Vertigo    post concussive    Family History  Problem Relation Age of Onset   Hypertension Mother    Lung cancer Father    Alzheimer's disease Father    Migraines Father    Hyperlipidemia Sister    Hypertension Sister    Colon cancer Maternal Grandmother    Heart failure Maternal Grandfather    Alzheimer's disease Paternal Grandmother    Lung cancer Paternal Grandfather    Esophageal cancer Neg Hx    Inflammatory bowel disease Neg Hx    Liver disease Neg Hx    Pancreatic cancer Neg Hx    Rectal cancer Neg Hx    Stomach cancer Neg Hx     Past Surgical History:  Procedure Laterality Date   BIOPSY  07/15/2021   Procedure: BIOPSY;  Surgeon: Mansouraty, Aloha Raddle., MD;  Location: [facility] ENDOSCOPY;  Service: Gastroenterology;;   [name]     right great toe   CHOLECYSTECTOMY N/A 06/05/2018   Procedure: LAPAROSCOPIC CHOLECYSTECTOMY WITH POSSIBLE INTRAOPERATIVE CHOLANGIOGRAM;  Surgeon: Vanderbilt Ned, MD;  Location: [facility] OR;  Service: General;  Laterality: N/A;   CORONARY STENT INTERVENTION N/A 05/12/2019   Procedure: CORONARY STENT INTERVENTION;  Surgeon: Anner Alm ORN, MD;  Location: [facility] INVASIVE CV LAB;  Service: Cardiovascular;;; Culprit-p-mLAD 95% 950% SP1) --> Scoring Balloon PTCA -> DES PCI (Resolute DES 3.5 x 38 - 3.8 mm; 0% LAD& 55% SP1). -->  SP1 was somewhat jailed with TIMI II flow post PCI.   CORONARY STENT INTERVENTION N/A 07/02/2019   Procedure: CORONARY STENT  INTERVENTION;  Surgeon: Anner Alm ORN, MD;  Location: [facility] INVASIVE CV LAB;  Service: Cardiovascular;; * 70% Proximal stent edge dissection (edge of previous stent that is patent) -->  successfully covered with additional RESOLUTE ONYX DES 3.5 mm x 8 mm overlapping proximal edge of previous stent (postdilated to 3.8 mm)   ENTEROSCOPY N/A 07/15/2021   Procedure: ENTEROSCOPY;  Surgeon: Wilhelmenia Aloha Raddle., MD;  Location: [facility] ENDOSCOPY;  Service: Gastroenterology;  Laterality: N/A;   GASTRIC BYPASS  11/2015   [facility]   GYNECOLOGIC CRYOSURGERY     HAND SURGERY     HIATAL HERNIA REPAIR  01/01/2024   in [place]    INJECTION KNEE Right 06/23/2024   Procedure: INJECTION, KNEE;  Surgeon:  Addie Cordella Hamilton, MD;  Location: [facility] OR;  Service: Orthopedics;  Laterality: Right;   LEFT HEART CATH AND CORONARY ANGIOGRAPHY N/A 05/12/2019   Procedure: LEFT HEART CATH AND CORONARY ANGIOGRAPHY;  Surgeon: Anner Alm ORN, MD;  Location: [facility] INVASIVE CV LAB;  Service: Cardiovascular;;; Culprit-p-mLAD 95% 950% SP1) --> DES PCI.;  RPAV 60% -small caliber vessel, did not appear flow-limiting (medical management).  EF 55 to 60%.  Normal LVEDP.   LEFT HEART CATH AND CORONARY ANGIOGRAPHY N/A 07/02/2019   Procedure: LEFT HEART CATH AND CORONARY ANGIOGRAPHY;  Surgeon: Anner Alm ORN, MD;  Location: [facility] INVASIVE CV LAB;  Service: Cardiovascular;; CULPRIT = ~70% proximal edge dissection of previous stent (DES PCI overlapping stent placed) - jails SP1 w/ ~60-70% ostial stenosis.SABRA mLAD 25%. RI 30%, RPAV 60%.   LEFT HEART CATH AND CORONARY ANGIOGRAPHY N/A 03/30/2022   Procedure: LEFT HEART CATH AND CORONARY ANGIOGRAPHY;  Surgeon: Wendel Lurena POUR, MD;  Location: [facility] INVASIVE CV LAB;  Service: Cardiovascular;  Laterality: N/A;   NM MYOVIEW  LTD  06/24/2019   (Lexiscan ): HIGH RISK.  EF 55%.  Large size, moderate severe defect in the anterior wall with associated anterior hypokinesis.  Suspect LAD territory.   NM MYOVIEW  LTD   10/23/2019   Normal EF 55 to 60%.  No EKG changes.  Small size mild severity fixed defect in the apical wall consistent with breast attenuation versus small prior infarct.  Marked improvement from anterior apical perfusion abnormality seen in February 2020.   TOTAL KNEE ARTHROPLASTY Left 06/23/2024   Procedure: ARTHROPLASTY, KNEE, TOTAL;  Surgeon: Addie Cordella Hamilton, MD;  Location: [facility] OR;  Service: Orthopedics;  Laterality: Left;   TRANSTHORACIC ECHOCARDIOGRAM  12/08/2020   Normal LV Fxn - EF 60-65%. No RWMA. Mod Asymmetric Basal Septa LVH with mild Concentric LVH. Gr II DD. Normal Longitudinal Strain. Normal RV size & fxn - normal RAP/CVP.  Normal MV. Mild AoV sclerosis w/ stenosis. Mild Ascending Ao dilation - ~38 mm (borderline).  => No change from 04/2017.   TUBAL LIGATION Bilateral    UPPER GASTROINTESTINAL ENDOSCOPY     VAGINAL HYSTERECTOMY  2003   with LSO and right salpingectomy; right ovary still present   [facility] MONITOR-14-day  01/2021   Predominant rhythm sinus-heart rate range 40 to 124 bpm.  Average 70 bpm.  Rare PVCs and isolated PACs noted.  PAC couplets and triplets also noted as well as trigeminy.  3 atrial runs of 4-5 beats ranging from 102 to 150 bpm.  (2 of 3 noted on patient trigger.  Remainder patient triggers were sinus rhythm with PACs or PVCs); no atrial fibrillation or other sustained arrhythmias noted.   Social History   Occupational History   Occupation: Retired Event Organiser: [employer]    Comment: Logistics, warehouse  Tobacco Use   Smoking status: Former    Current packs/day: 0.00    Average packs/day: 0.1 packs/day for 30.0 years (3.0 ttl pk-yrs)    Types: Cigarettes    Start date: 04/26/1982    Quit date: 04/26/2012    Years since quitting: 12.2   Smokeless tobacco: Never  Vaping Use   Vaping status: Never Used  Substance and Sexual Activity   Alcohol use: Yes    Comment: occasional   Drug use: Not Currently    Types: Marijuana     Comment: weed juice   Sexual activity: Yes    Partners: Male    Birth control/protection: Surgical    Comment:  hysterectomy

## 2024-07-14 ENCOUNTER — Ambulatory Visit: Admitting: Rehabilitative and Restorative Service Providers"

## 2024-07-14 ENCOUNTER — Ambulatory Visit: Admitting: Physical Therapy

## 2024-07-15 ENCOUNTER — Ambulatory Visit: Admitting: Rehabilitative and Restorative Service Providers"

## 2024-07-20 NOTE — Therapy (Signed)
 " OUTPATIENT PHYSICAL THERAPY LOWER EXTREMITY EVALUATION   Patient Name: Carrie Reynolds MRN: [id number] DOB:[birth year], 68 y.o., female Today's Date: 07/22/2024  END OF SESSION:  PT End of Session - 07/21/24 1513     Visit Number 1    Number of Visits 31    Date for Recertification  09/30/23    PT Start Time 1145    PT Stop Time 1225    PT Time Calculation (min) 40 min    Activity Tolerance Patient tolerated treatment well    Behavior During Therapy [facility] for tasks assessed/performed          Past Medical History:  Diagnosis Date   Anxiety    Arnold-Chiari malformation (HCC) 2006   Arthritis    CAD S/P PCI 05/06/2019   05/06/2019: prox LAD (@ SPI) PCI with Resolute Onyx DES 3.5  x 8, Residual 60% PDA, Normal LVF; 07/02/2019: relook Cath for Anterior Ischemia on Mhoview --> CATH 70% pre-stent/proximal edge dissection --> proximal overlapping DES (Resolute Onyx 3.5 x 8 - new & old stent post-dilated to 3.8 mm)   Cervical strain    syndrome   Cervicogenic headache    Chronic kidney disease    Concussion with loss of consciousness 10/09/2017   CTS (carpal tunnel syndrome)    Depression    GERD (gastroesophageal reflux disease)    Glucose intolerance (impaired glucose tolerance)    High coronary artery calcium  score    Score of 1460.  Coronary CTA shows high-grade proximal-mid LAD stenosis (CT FFR 0.50).  Also PDA/PLA 60 to 70% stenosis (CTO FFR 0.75)   History of hiatal hernia    History of kidney stones    Hypertension    Migraine headache    with visual aura   Obesity    OSA (obstructive sleep apnea)    not treated   Postmenopausal    Sleep apnea    Vertigo    post concussive   Past Surgical History:  Procedure Laterality Date   BIOPSY  07/15/2021   Procedure: BIOPSY;  Surgeon: Wilhelmenia Aloha Raddle., MD;  Location: [facility] ENDOSCOPY;  Service: Gastroenterology;;   [name]     right great toe   CHOLECYSTECTOMY N/A 06/05/2018   Procedure: LAPAROSCOPIC  CHOLECYSTECTOMY WITH POSSIBLE INTRAOPERATIVE CHOLANGIOGRAM;  Surgeon: Vanderbilt Ned, MD;  Location: [facility] OR;  Service: General;  Laterality: N/A;   CORONARY STENT INTERVENTION N/A 05/12/2019   Procedure: CORONARY STENT INTERVENTION;  Surgeon: Anner Alm ORN, MD;  Location: [facility] INVASIVE CV LAB;  Service: Cardiovascular;;; Culprit-p-mLAD 95% 950% SP1) --> Scoring Balloon PTCA -> DES PCI (Resolute DES 3.5 x 38 - 3.8 mm; 0% LAD& 55% SP1). -->  SP1 was somewhat jailed with TIMI II flow post PCI.   CORONARY STENT INTERVENTION N/A 07/02/2019   Procedure: CORONARY STENT INTERVENTION;  Surgeon: Anner Alm ORN, MD;  Location: [facility] INVASIVE CV LAB;  Service: Cardiovascular;; * 70% Proximal stent edge dissection (edge of previous stent that is patent) -->  successfully covered with additional RESOLUTE ONYX DES 3.5 mm x 8 mm overlapping proximal edge of previous stent (postdilated to 3.8 mm)   ENTEROSCOPY N/A 07/15/2021   Procedure: ENTEROSCOPY;  Surgeon: Wilhelmenia Aloha Raddle., MD;  Location: [facility] ENDOSCOPY;  Service: Gastroenterology;  Laterality: N/A;   GASTRIC BYPASS  11/2015   [facility]   GYNECOLOGIC CRYOSURGERY     HAND SURGERY     HIATAL HERNIA REPAIR  01/01/2024   in [place]    INJECTION KNEE Right 06/23/2024   Procedure:  INJECTION, KNEE;  Surgeon: Addie Cordella Hamilton, MD;  Location: [facility] OR;  Service: Orthopedics;  Laterality: Right;   LEFT HEART CATH AND CORONARY ANGIOGRAPHY N/A 05/12/2019   Procedure: LEFT HEART CATH AND CORONARY ANGIOGRAPHY;  Surgeon: Anner Alm ORN, MD;  Location: [facility] INVASIVE CV LAB;  Service: Cardiovascular;;; Culprit-p-mLAD 95% 950% SP1) --> DES PCI.;  RPAV 60% -small caliber vessel, did not appear flow-limiting (medical management).  EF 55 to 60%.  Normal LVEDP.   LEFT HEART CATH AND CORONARY ANGIOGRAPHY N/A 07/02/2019   Procedure: LEFT HEART CATH AND CORONARY ANGIOGRAPHY;  Surgeon: Anner Alm ORN, MD;  Location: [facility] INVASIVE CV LAB;  Service: Cardiovascular;; CULPRIT = ~70%  proximal edge dissection of previous stent (DES PCI overlapping stent placed) - jails SP1 w/ ~60-70% ostial stenosis.SABRA mLAD 25%. RI 30%, RPAV 60%.   LEFT HEART CATH AND CORONARY ANGIOGRAPHY N/A 03/30/2022   Procedure: LEFT HEART CATH AND CORONARY ANGIOGRAPHY;  Surgeon: Wendel Lurena POUR, MD;  Location: [facility] INVASIVE CV LAB;  Service: Cardiovascular;  Laterality: N/A;   NM MYOVIEW  LTD  06/24/2019   (Lexiscan ): HIGH RISK.  EF 55%.  Large size, moderate severe defect in the anterior wall with associated anterior hypokinesis.  Suspect LAD territory.   NM MYOVIEW  LTD  10/23/2019   Normal EF 55 to 60%.  No EKG changes.  Small size mild severity fixed defect in the apical wall consistent with breast attenuation versus small prior infarct.  Marked improvement from anterior apical perfusion abnormality seen in February 2020.   TOTAL KNEE ARTHROPLASTY Left 06/23/2024   Procedure: ARTHROPLASTY, KNEE, TOTAL;  Surgeon: Addie Cordella Hamilton, MD;  Location: [facility] OR;  Service: Orthopedics;  Laterality: Left;   TRANSTHORACIC ECHOCARDIOGRAM  12/08/2020   Normal LV Fxn - EF 60-65%. No RWMA. Mod Asymmetric Basal Septa LVH with mild Concentric LVH. Gr II DD. Normal Longitudinal Strain. Normal RV size & fxn - normal RAP/CVP.  Normal MV. Mild AoV sclerosis w/ stenosis. Mild Ascending Ao dilation - ~38 mm (borderline).  => No change from 04/2017.   TUBAL LIGATION Bilateral    UPPER GASTROINTESTINAL ENDOSCOPY     VAGINAL HYSTERECTOMY  2003   with LSO and right salpingectomy; right ovary still present   [facility] MONITOR-14-day  01/2021   Predominant rhythm sinus-heart rate range 40 to 124 bpm.  Average 70 bpm.  Rare PVCs and isolated PACs noted.  PAC couplets and triplets also noted as well as trigeminy.  3 atrial runs of 4-5 beats ranging from 102 to 150 bpm.  (2 of 3 noted on patient trigger.  Remainder patient triggers were sinus rhythm with PACs or PVCs); no atrial fibrillation or other sustained arrhythmias noted.   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   Ulcer of small intestine 09/06/2021   Acute blood loss anemia 09/06/2021   Iron deficiency 09/06/2021   Colon cancer screening 09/06/2021   Long term (current) use of antithrombotics/antiplatelets 09/06/2021   Jejunal ulcer 08/21/2021   Syncope and collapse 07/15/2021   Sinus bradycardia 11/03/2019   Atypical angina 10/12/2019   Fatigue 10/12/2019   Presence of 2 overlapping Drug Coated Stents in LAD coronary  artery    Coronary artery disease involving native coronary artery of native heart with angina pectoris 06/17/2019   DOE (dyspnea on exertion) 06/16/2019   CAD S/P DES PCI-proximal LAD 05/20/2019   Agatston coronary artery calcium  score greater than 400 04/16/2019   Migraine with vertigo 07/29/2018   Tinnitus of both ears 07/29/2018   Rapid palpitations 04/16/2017   Systolic murmur 04/16/2017   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   OSA on CPAP 11/19/2013   Hyperlipidemia with target LDL less than 70 07/29/2013   Obesity (BMI 30.0-34.9) 04/25/2012   Depression with anxiety 04/25/2012   Environmental allergies 04/25/2012   Essential (primary) hypertension 04/25/2012   Chiari malformation type I (HCC) 01/03/2012   Postmenopausal 09/18/2011    PCP: Loring Tanda Mae, MD   REFERRING PROVIDER:   Addie Cordella Hamilton, MD   *MD NOTE: PT 3x/wk for 6 wks for ROM and strengthening starting ASAP *  REFERRING DIAG: S03.347 (ICD-10-CM) - Status post total left knee replacement   THERAPY DIAG:  Chronic pain of left knee  Stiffness of left knee, not elsewhere  classified  Localized edema  Other abnormalities of gait and mobility  Left leg weakness  Rationale for Evaluation and Treatment: Rehabilitation  ONSET DATE: 06/23/24 surgery  L TKA  SUBJECTIVE:   SUBJECTIVE STATEMENT: Pt reports OA was progressing in the L knee which led to TKA.  Had only 3 visits with home PT.  PERTINENT HISTORY: No previous L knee surgeries PAIN:  Are you having pain? Yes: NPRS scale: 3/10 Pain location: anterior, quad and laterally Pain description: achy, swelling Aggravating factors: nighttime, walking, standing Relieving factors: meds, ice  PRECAUTIONS: None  RED FLAGS: None   WEIGHT BEARING RESTRICTIONS: No  FALLS:  Has patient fallen in last 6 months? 2 due to vertigo issues  LIVING ENVIRONMENT: Lives with: lives with their spouse Lives in: House/apartment Stairs: Yes: Internal: 12 steps; on left going up Has following equipment at home: Single point cane  OCCUPATION: retired  PLOF: Independent  PATIENT GOALS: increase mobility.  NEXT MD VISIT: 07/28/23  OBJECTIVE:  Note: Objective measures were completed at Evaluation unless otherwise noted.  DIAGNOSTIC FINDINGS: 10/14/23- AP lateral merchant radiographs left knee reviewed.  Moderate to severe  arthritis is present in the lateral compartment with spurring on both the  femoral and tibial side.  Alignment intact.  Mild arthritis present in the  medial and patellofemoral compartments.  No acute fracture.   PATIENT SURVEYS:  PSFS: THE PATIENT SPECIFIC FUNCTIONAL SCALE  Place score of 0-10 (0 = unable to perform activity and 10 = able to perform activity at the same level as before injury or problem)  Activity Date: 07/21/24    driving 0    2.standing>5 min 3    3.ccoking small meal 1    4.      Total Score 1.33      Total Score = Sum of activity scores/number of activities  Minimally Detectable Change: 3 points (for single activity); 2 points (for average score)  [facility] (nd). The Patient Specific Functional Scale . Retrieved from [email or web address]   COGNITION: Overall cognitive status: Within functional limits for tasks assessed     SENSATION: WFL  EDEMA:  Not assessed  MUSCLE LENGTH: Hamstrings: Left mod restriction  POSTURE: No Significant postural limitations  PALPATION: 1+ around knee jt line and incision  LOWER EXTREMITY ROM:  Active ROM Right eval Left eval  Hip flexion    Hip extension  Hip abduction    Hip adduction    Hip internal rotation    Hip external rotation    Knee flexion  82 Supine  Knee extension  4 from 0 supine  Ankle dorsiflexion    Ankle plantarflexion    Ankle inversion    Ankle eversion     (Blank rows = not tested)  LOWER EXTREMITY MMT:  MMT Right eval Left eval  Hip flexion  4+  Hip extension    Hip abduction  4+  Hip adduction    Hip internal rotation    Hip external rotation    Knee flexion  4  Knee extension  4-  Ankle dorsiflexion    Ankle plantarflexion    Ankle inversion    Ankle eversion     (Blank rows = not tested)  LOWER EXTREMITY SPECIAL TESTS:  N/A  FUNCTIONAL TESTS:  5 times sit to stand: 16 sec  GAIT: Distance walked: household Assistive device utilized: Single point cane Level of assistance: Complete Independence Comments: ---                                                                                                                                TREATMENT DATE: 07/21/24 Initial evaluation of L knee completed followed by instruction and trial set of HEP.    PATIENT EDUCATION:  Education details: HEP Person educated: Patient Education method: Programmer, Multimedia, Facilities Manager, Actor cues, Verbal cues, and Handouts Education comprehension: verbalized understanding, returned demonstration, and verbal cues required  HOME EXERCISE PROGRAM: Access Code: [email or web address] URL:  [email or web address] Date: 07/21/2024 Prepared by: Burnard Meth  Exercises - Supine Quad Set  - 2 x daily - 7 x weekly - 2 sets - 10 reps - 2 hold - Active Straight Leg Raise with Quad Set  - 2 x daily - 7 x weekly - 2 sets - 10 reps - Supine Heel Slide  - 2 x daily - 7 x weekly - 2 sets - 10 reps - Supine Bridge  - 1 x daily - 7 x weekly - 3 sets - 10 reps - Mini Squat with Counter Support  - 2 x daily - 7 x weekly - 2 sets - 10 reps - Standing March with Counter Support  - 2 x daily - 7 x weekly - 2 sets - 10 reps - Heel Raises with Counter Support  - 2 x daily - 7 x weekly - 2 sets - 10 reps  ASSESSMENT:  CLINICAL IMPRESSION: Patient is a 68 y.o. female who was seen today for physical therapy evaluation and treatment for L Knee. She presents with decreased ROM, decreased strength, altered gait and pain. Patient would benefit from skilled PT services to address the above deficits and return to previous LOA.   OBJECTIVE IMPAIRMENTS: Abnormal gait, difficulty walking, decreased ROM, decreased strength, increased edema, impaired flexibility, and pain.   ACTIVITY LIMITATIONS: bending, sitting, standing, squatting, sleeping, stairs, and transfers  PARTICIPATION  LIMITATIONS: meal prep, driving, and community activity  PERSONAL FACTORS: 1-2 comorbidities: HTN and cardiac hx are also affecting patient's functional outcome.   REHAB POTENTIAL: Good  CLINICAL DECISION MAKING: Stable/uncomplicated  EVALUATION COMPLEXITY: Moderate   GOALS: Goals reviewed with patient? Yes  SHORT TERM GOALS: Target date: 08/11/2024 3 weeks    Pt to be independent with HEP. Baseline: Goal status: INITIAL  2.  Decrease pain by 1 level. Baseline:  Goal status: INITIAL  3.  Improved knee flexion in swing phase gait. Baseline:  Goal status: INITIAL  LONG TERM GOALS: Target date: 09/29/2024  10 weeks    Pt to be independent with self progressive HEP at discharge. Baseline:  Goal  status: INITIAL  2.  Increase AROM of L knee to at least 0>105 Baseline:  Goal status: INITIAL  3.  Increase strength to at least 4/5 of L LE to enable independence with ADLs and ambulation. Baseline:  Goal status: INITIAL  4.  Pt able to ambulate short community distances without use of AD. Baseline:  Goal status: INITIAL  5.  Increase score of PSFS by at least 3 points to demonstrate measurable improvement. Baseline:  Goal status: INITIAL  6.  Decrease max pain to 2/10 with all activities. Baseline:  Goal status: INITIAL   PLAN:  PT FREQUENCY: 3x/week  PT DURATION: 10 weeks  PLANNED INTERVENTIONS: 97164- PT Re-evaluation, 97750- Physical Performance Testing, 97110-Therapeutic exercises, 97530- Therapeutic activity, [id number]- Neuromuscular re-education, 97535- Self Care, [id number]- Manual therapy, [phone]- Gait training, [phone]- Aquatic Therapy, [phone]- Electrical stimulation (unattended), 97016- Vasopneumatic device, 20560 (1-2 muscles), 20561 (3+ muscles)- Dry Needling, Patient/Family education, Balance training, Stair training, Joint mobilization, Scar mobilization, Cryotherapy, and Moist heat  PLAN FOR NEXT SESSION: Review HEP.    Burnard CHRISTELLA Meth, PT 07/22/2024, 7:39 AM  "

## 2024-07-21 ENCOUNTER — Ambulatory Visit

## 2024-07-21 ENCOUNTER — Other Ambulatory Visit: Payer: Self-pay

## 2024-07-21 DIAGNOSIS — M25662 Stiffness of left knee, not elsewhere classified: Secondary | ICD-10-CM | POA: Diagnosis not present

## 2024-07-21 DIAGNOSIS — M25562 Pain in left knee: Secondary | ICD-10-CM | POA: Diagnosis not present

## 2024-07-21 DIAGNOSIS — R29898 Other symptoms and signs involving the musculoskeletal system: Secondary | ICD-10-CM

## 2024-07-21 DIAGNOSIS — R600 Localized edema: Secondary | ICD-10-CM

## 2024-07-21 DIAGNOSIS — R2689 Other abnormalities of gait and mobility: Secondary | ICD-10-CM

## 2024-07-21 DIAGNOSIS — G8929 Other chronic pain: Secondary | ICD-10-CM | POA: Diagnosis not present

## 2024-07-22 ENCOUNTER — Encounter

## 2024-07-22 ENCOUNTER — Telehealth: Payer: Self-pay | Admitting: Surgical

## 2024-07-22 NOTE — Telephone Encounter (Signed)
 call pt left vm cancelled appt due to [place] (therapist out sick being out sick ) Can r/s when come in on next PT appt

## 2024-07-24 ENCOUNTER — Encounter: Admitting: Rehabilitative and Restorative Service Providers"

## 2024-07-27 ENCOUNTER — Encounter: Payer: Self-pay | Admitting: Surgical

## 2024-07-27 ENCOUNTER — Ambulatory Visit (INDEPENDENT_AMBULATORY_CARE_PROVIDER_SITE_OTHER): Admitting: Surgical

## 2024-07-27 DIAGNOSIS — Z96652 Presence of left artificial knee joint: Secondary | ICD-10-CM

## 2024-07-27 MED ORDER — OXYCODONE-ACETAMINOPHEN 5-325 MG PO TABS: 1.0000 | ORAL_TABLET | Freq: Four times a day (QID) | ORAL | 0 refills | Status: AC | PRN

## 2024-07-27 NOTE — Progress Notes (Signed)
 "  Post-Op Visit Note   Patient: Carrie Reynolds           Date of Birth: 12/07/1955           MRN: [id number] Visit Date: 07/27/2024 PCP: Loring Tanda Mae, MD   Assessment & Plan:  Chief Complaint:  Chief Complaint  Patient presents with   Left Knee - Routine Post Op     LEFT TKA  (surgery date 06-23-24)     Visit Diagnoses:  1. Status post total left knee replacement     Plan: Patient is a 69 year old female who presents s/p left total knee arthroplasty on 06/23/2024.  She is about 4.5 weeks out from surgery.  She has been doing her physical therapy exercises primarily at home due to a lot of her physical therapist being sick and her wanting to avoid catching any viruses.  She does report continual improvement in her pain and states that the knee is feeling better than it was 4 days ago.  Using CPM machine up to 85 degrees.  On exam, patient has incision that is healing well without evidence of infection or dehiscence.  She has no effusion.  Has extension to about 2 to 3 degrees.  Has flexion to about 75 degrees.  No mid flexion instability.  No calf tenderness.  Negative Homans' sign.  Palpable PT pulse.  Excellent quad strength.  Plan at this time is switch from Dilaudid  to Percocet for pain control.  Counseled her on the importance of achieving better knee flexion and full knee extension.  We did discuss the possibility of needing manipulation under anesthesia and what that entails.  We will plan to see her back in 10 days for clinical recheck and decision for or against manipulation at that time.  Follow-Up Instructions: No follow-ups on file.   Orders:  No orders of the defined types were placed in this encounter.  No orders of the defined types were placed in this encounter.   Imaging: No results found.  PMFS History: Patient Active Problem List   Diagnosis Date Noted   Arthritis of left knee 06/28/2024   S/P total knee replacement, left 06/23/2024    Healthcare maintenance 09/14/2023   Recurrent major depressive episodes, moderate (HCC) 02/07/2023   Migraine with aura, not intractable, without status migrainosus 02/07/2023   Post-traumatic stress disorder, chronic 02/07/2023   Bloating symptom 09/08/2021   Gastrojejunal ulceration 09/06/2021   Ulcer of small intestine 09/06/2021   Acute blood loss anemia 09/06/2021   Iron deficiency 09/06/2021   Colon cancer screening 09/06/2021   Long term (current) use of antithrombotics/antiplatelets 09/06/2021   Jejunal ulcer 08/21/2021   Syncope and collapse 07/15/2021   Sinus bradycardia 11/03/2019   Atypical angina 10/12/2019   Fatigue 10/12/2019   Presence of 2 overlapping Drug Coated Stents in LAD coronary artery    Coronary artery disease involving native coronary artery of native heart with angina pectoris 06/17/2019   DOE (dyspnea on exertion) 06/16/2019   CAD S/P DES PCI-proximal LAD 05/20/2019   Agatston coronary artery calcium  score greater than 400 04/16/2019   Migraine with vertigo 07/29/2018   Tinnitus of both ears 07/29/2018   Rapid palpitations 04/16/2017   Systolic murmur 04/16/2017   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   OSA on CPAP 11/19/2013   Hyperlipidemia with target LDL less than 70 07/29/2013   Obesity (BMI 30.0-34.9) 04/25/2012   Depression with anxiety 04/25/2012   Environmental allergies 04/25/2012   Essential (primary) hypertension 04/25/2012  Chiari malformation type I (HCC) 01/03/2012   Postmenopausal 09/18/2011   Past Medical History:  Diagnosis Date   Anxiety    Arnold-Chiari malformation (HCC) 2006   Arthritis    CAD S/P PCI 05/06/2019   05/06/2019: prox LAD (@ SPI) PCI with Resolute Onyx DES 3.5  x 8, Residual 60% PDA, Normal LVF; 07/02/2019: relook Cath for Anterior Ischemia on Mhoview --> CATH 70% pre-stent/proximal edge dissection --> proximal overlapping DES (Resolute Onyx 3.5 x 8 - new & old stent post-dilated to 3.8 mm)   Cervical strain     syndrome   Cervicogenic headache    Chronic kidney disease    Concussion with loss of consciousness 10/09/2017   CTS (carpal tunnel syndrome)    Depression    GERD (gastroesophageal reflux disease)    Glucose intolerance (impaired glucose tolerance)    High coronary artery calcium  score    Score of 1460.  Coronary CTA shows high-grade proximal-mid LAD stenosis (CT FFR 0.50).  Also PDA/PLA 60 to 70% stenosis (CTO FFR 0.75)   History of hiatal hernia    History of kidney stones    Hypertension    Migraine headache    with visual aura   Obesity    OSA (obstructive sleep apnea)    not treated   Postmenopausal    Sleep apnea    Vertigo    post concussive    Family History  Problem Relation Age of Onset   Hypertension Mother    Lung cancer Father    Alzheimer's disease Father    Migraines Father    Hyperlipidemia Sister    Hypertension Sister    Colon cancer Maternal Grandmother    Heart failure Maternal Grandfather    Alzheimer's disease Paternal Grandmother    Lung cancer Paternal Grandfather    Esophageal cancer Neg Hx    Inflammatory bowel disease Neg Hx    Liver disease Neg Hx    Pancreatic cancer Neg Hx    Rectal cancer Neg Hx    Stomach cancer Neg Hx     Past Surgical History:  Procedure Laterality Date   BIOPSY  07/15/2021   Procedure: BIOPSY;  Surgeon: Mansouraty, Aloha Raddle., MD;  Location: [facility] ENDOSCOPY;  Service: Gastroenterology;;   [name]     right great toe   CHOLECYSTECTOMY N/A 06/05/2018   Procedure: LAPAROSCOPIC CHOLECYSTECTOMY WITH POSSIBLE INTRAOPERATIVE CHOLANGIOGRAM;  Surgeon: Vanderbilt Ned, MD;  Location: [facility] OR;  Service: General;  Laterality: N/A;   CORONARY STENT INTERVENTION N/A 05/12/2019   Procedure: CORONARY STENT INTERVENTION;  Surgeon: Anner Alm ORN, MD;  Location: [facility] INVASIVE CV LAB;  Service: Cardiovascular;;; Culprit-p-mLAD 95% 950% SP1) --> Scoring Balloon PTCA -> DES PCI (Resolute DES 3.5 x 38 - 3.8 mm; 0% LAD& 55% SP1). -->   SP1 was somewhat jailed with TIMI II flow post PCI.   CORONARY STENT INTERVENTION N/A 07/02/2019   Procedure: CORONARY STENT INTERVENTION;  Surgeon: Anner Alm ORN, MD;  Location: [facility] INVASIVE CV LAB;  Service: Cardiovascular;; * 70% Proximal stent edge dissection (edge of previous stent that is patent) -->  successfully covered with additional RESOLUTE ONYX DES 3.5 mm x 8 mm overlapping proximal edge of previous stent (postdilated to 3.8 mm)   ENTEROSCOPY N/A 07/15/2021   Procedure: ENTEROSCOPY;  Surgeon: Wilhelmenia Aloha Raddle., MD;  Location: [facility] ENDOSCOPY;  Service: Gastroenterology;  Laterality: N/A;   GASTRIC BYPASS  11/2015   [facility]   GYNECOLOGIC CRYOSURGERY     HAND SURGERY  HIATAL HERNIA REPAIR  01/01/2024   in [place]    INJECTION KNEE Right 06/23/2024   Procedure: INJECTION, KNEE;  Surgeon: Addie Cordella Hamilton, MD;  Location: [facility] OR;  Service: Orthopedics;  Laterality: Right;   LEFT HEART CATH AND CORONARY ANGIOGRAPHY N/A 05/12/2019   Procedure: LEFT HEART CATH AND CORONARY ANGIOGRAPHY;  Surgeon: Anner Alm ORN, MD;  Location: [facility] INVASIVE CV LAB;  Service: Cardiovascular;;; Culprit-p-mLAD 95% 950% SP1) --> DES PCI.;  RPAV 60% -small caliber vessel, did not appear flow-limiting (medical management).  EF 55 to 60%.  Normal LVEDP.   LEFT HEART CATH AND CORONARY ANGIOGRAPHY N/A 07/02/2019   Procedure: LEFT HEART CATH AND CORONARY ANGIOGRAPHY;  Surgeon: Anner Alm ORN, MD;  Location: [facility] INVASIVE CV LAB;  Service: Cardiovascular;; CULPRIT = ~70% proximal edge dissection of previous stent (DES PCI overlapping stent placed) - jails SP1 w/ ~60-70% ostial stenosis.SABRA mLAD 25%. RI 30%, RPAV 60%.   LEFT HEART CATH AND CORONARY ANGIOGRAPHY N/A 03/30/2022   Procedure: LEFT HEART CATH AND CORONARY ANGIOGRAPHY;  Surgeon: Wendel Lurena POUR, MD;  Location: [facility] INVASIVE CV LAB;  Service: Cardiovascular;  Laterality: N/A;   NM MYOVIEW  LTD  06/24/2019   (Lexiscan ): HIGH RISK.  EF 55%.  Large size,  moderate severe defect in the anterior wall with associated anterior hypokinesis.  Suspect LAD territory.   NM MYOVIEW  LTD  10/23/2019   Normal EF 55 to 60%.  No EKG changes.  Small size mild severity fixed defect in the apical wall consistent with breast attenuation versus small prior infarct.  Marked improvement from anterior apical perfusion abnormality seen in February 2020.   TOTAL KNEE ARTHROPLASTY Left 06/23/2024   Procedure: ARTHROPLASTY, KNEE, TOTAL;  Surgeon: Addie Cordella Hamilton, MD;  Location[facility];  Service: Orthopedics;  Laterality: Left;   TRANSTHORACIC ECHOCARDIOGRAM  12/08/2020   Normal LV Fxn - EF 60-65%. No RWMA. Mod Asymmetric Basal Septa LVH with mild Concentric LVH. Gr II DD. Normal Longitudinal Strain. Normal RV size & fxn - normal RAP/CVP.  Normal MV. Mild AoV sclerosis w/ stenosis. Mild Ascending Ao dilation - ~38 mm (borderline).  => No change from 04/2017.   TUBAL LIGATION Bilateral    UPPER GASTROINTESTINAL ENDOSCOPY     VAGINAL HYSTERECTOMY  2003   with LSO and right salpingectomy; right ovary still present   ZIO [facility] MONITOR-14-day  01/2021   Predominant rhythm sinus-heart rate range 40 to 124 bpm.  Average 70 bpm.  Rare PVCs and isolated PACs noted.  PAC couplets and triplets also noted as well as trigeminy.  3 atrial runs of 4-5 beats ranging from 102 to 150 bpm.  (2 of 3 noted on patient trigger.  Remainder patient triggers were sinus rhythm with PACs or PVCs); no atrial fibrillation or other sustained arrhythmias noted.   Social History   Occupational History   Occupation: Retired Event Organiser: [employer]    Comment: Logistics, warehouse  Tobacco Use   Smoking status: Former    Current packs/day: 0.00    Average packs/day: 0.1 packs/day for 30.0 years (3.0 ttl pk-yrs)    Types: Cigarettes    Start date: 04/26/1982    Quit date: 04/26/2012    Years since quitting: 12.2   Smokeless tobacco: Never  Vaping Use   Vaping status: Never Used   Substance and Sexual Activity   Alcohol use: Yes    Comment: occasional   Drug use: Not Currently    Types: Marijuana  Comment: weed juice   Sexual activity: Yes    Partners: Male    Birth control/protection: Surgical    Comment: hysterectomy     "

## 2024-07-31 ENCOUNTER — Ambulatory Visit: Admitting: Physical Therapy

## 2024-07-31 ENCOUNTER — Encounter: Payer: Self-pay | Admitting: Physical Therapy

## 2024-07-31 DIAGNOSIS — M25562 Pain in left knee: Secondary | ICD-10-CM | POA: Diagnosis not present

## 2024-07-31 DIAGNOSIS — R29898 Other symptoms and signs involving the musculoskeletal system: Secondary | ICD-10-CM | POA: Diagnosis not present

## 2024-07-31 DIAGNOSIS — R600 Localized edema: Secondary | ICD-10-CM

## 2024-07-31 DIAGNOSIS — G8929 Other chronic pain: Secondary | ICD-10-CM | POA: Diagnosis not present

## 2024-07-31 DIAGNOSIS — R2689 Other abnormalities of gait and mobility: Secondary | ICD-10-CM

## 2024-07-31 DIAGNOSIS — M25662 Stiffness of left knee, not elsewhere classified: Secondary | ICD-10-CM

## 2024-07-31 NOTE — Therapy (Signed)
 " OUTPATIENT PHYSICAL THERAPY TREATMENT   Patient Name: Deanndra Kirley MRN: [id number] DOB:09-30-1955, 69 y.o., female Today's Date: 07/31/2024  END OF SESSION:  PT End of Session - 07/31/24 1105     Visit Number 2    Number of Visits 31    Date for Recertification  09/30/23    PT Start Time 1106   pt arrived late   PT Stop Time 1141    PT Time Calculation (min) 35 min    Activity Tolerance Patient tolerated treatment well    Behavior During Therapy [facility] for tasks assessed/performed           Past Medical History:  Diagnosis Date   Anxiety    Arnold-Chiari malformation (HCC) 2006   Arthritis    CAD S/P PCI 05/06/2019   05/06/2019: prox LAD (@ SPI) PCI with Resolute Onyx DES 3.5  x 8, Residual 60% PDA, Normal LVF; 07/02/2019: relook Cath for Anterior Ischemia on Mhoview --> CATH 70% pre-stent/proximal edge dissection --> proximal overlapping DES (Resolute Onyx 3.5 x 8 - new & old stent post-dilated to 3.8 mm)   Cervical strain    syndrome   Cervicogenic headache    Chronic kidney disease    Concussion with loss of consciousness 10/09/2017   CTS (carpal tunnel syndrome)    Depression    GERD (gastroesophageal reflux disease)    Glucose intolerance (impaired glucose tolerance)    High coronary artery calcium  score    Score of 1460.  Coronary CTA shows high-grade proximal-mid LAD stenosis (CT FFR 0.50).  Also PDA/PLA 60 to 70% stenosis (CTO FFR 0.75)   History of hiatal hernia    History of kidney stones    Hypertension    Migraine headache    with visual aura   Obesity    OSA (obstructive sleep apnea)    not treated   Postmenopausal    Sleep apnea    Vertigo    post concussive   Past Surgical History:  Procedure Laterality Date   BIOPSY  07/15/2021   Procedure: BIOPSY;  Surgeon: Wilhelmenia Aloha Raddle., MD;  Location: [facility] ENDOSCOPY;  Service: Gastroenterology;;   [name]     right great toe   CHOLECYSTECTOMY N/A 06/05/2018   Procedure: LAPAROSCOPIC  CHOLECYSTECTOMY WITH POSSIBLE INTRAOPERATIVE CHOLANGIOGRAM;  Surgeon: Vanderbilt Ned, MD;  Location: [facility] OR;  Service: General;  Laterality: N/A;   CORONARY STENT INTERVENTION N/A 05/12/2019   Procedure: CORONARY STENT INTERVENTION;  Surgeon: Anner Alm ORN, MD;  Location: [facility] INVASIVE CV LAB;  Service: Cardiovascular;;; Culprit-p-mLAD 95% 950% SP1) --> Scoring Balloon PTCA -> DES PCI (Resolute DES 3.5 x 38 - 3.8 mm; 0% LAD& 55% SP1). -->  SP1 was somewhat jailed with TIMI II flow post PCI.   CORONARY STENT INTERVENTION N/A 07/02/2019   Procedure: CORONARY STENT INTERVENTION;  Surgeon: Anner Alm ORN, MD;  Location: [facility] INVASIVE CV LAB;  Service: Cardiovascular;; * 70% Proximal stent edge dissection (edge of previous stent that is patent) -->  successfully covered with additional RESOLUTE ONYX DES 3.5 mm x 8 mm overlapping proximal edge of previous stent (postdilated to 3.8 mm)   ENTEROSCOPY N/A 07/15/2021   Procedure: ENTEROSCOPY;  Surgeon: Wilhelmenia Aloha Raddle., MD;  Location: [facility] ENDOSCOPY;  Service: Gastroenterology;  Laterality: N/A;   GASTRIC BYPASS  11/2015   [facility]   GYNECOLOGIC CRYOSURGERY     HAND SURGERY     HIATAL HERNIA REPAIR  01/01/2024   in [place]    INJECTION KNEE Right 06/23/2024  Procedure: INJECTION, KNEE;  Surgeon: Addie Cordella Hamilton, MD;  Location: [facility] OR;  Service: Orthopedics;  Laterality: Right;   LEFT HEART CATH AND CORONARY ANGIOGRAPHY N/A 05/12/2019   Procedure: LEFT HEART CATH AND CORONARY ANGIOGRAPHY;  Surgeon: Anner Alm ORN, MD;  Location: [facility] INVASIVE CV LAB;  Service: Cardiovascular;;; Culprit-p-mLAD 95% 950% SP1) --> DES PCI.;  RPAV 60% -small caliber vessel, did not appear flow-limiting (medical management).  EF 55 to 60%.  Normal LVEDP.   LEFT HEART CATH AND CORONARY ANGIOGRAPHY N/A 07/02/2019   Procedure: LEFT HEART CATH AND CORONARY ANGIOGRAPHY;  Surgeon: Anner Alm ORN, MD;  Location: [facility] INVASIVE CV LAB;  Service: Cardiovascular;; CULPRIT = ~70%  proximal edge dissection of previous stent (DES PCI overlapping stent placed) - jails SP1 w/ ~60-70% ostial stenosis.SABRA mLAD 25%. RI 30%, RPAV 60%.   LEFT HEART CATH AND CORONARY ANGIOGRAPHY N/A 03/30/2022   Procedure: LEFT HEART CATH AND CORONARY ANGIOGRAPHY;  Surgeon: Wendel Lurena POUR, MD;  Location: [facility] INVASIVE CV LAB;  Service: Cardiovascular;  Laterality: N/A;   NM MYOVIEW  LTD  06/24/2019   (Lexiscan ): HIGH RISK.  EF 55%.  Large size, moderate severe defect in the anterior wall with associated anterior hypokinesis.  Suspect LAD territory.   NM MYOVIEW  LTD  10/23/2019   Normal EF 55 to 60%.  No EKG changes.  Small size mild severity fixed defect in the apical wall consistent with breast attenuation versus small prior infarct.  Marked improvement from anterior apical perfusion abnormality seen in February 2020.   TOTAL KNEE ARTHROPLASTY Left 06/23/2024   Procedure: ARTHROPLASTY, KNEE, TOTAL;  Surgeon: Addie Cordella Hamilton, MD;  Location: [facility] OR;  Service: Orthopedics;  Laterality: Left;   TRANSTHORACIC ECHOCARDIOGRAM  12/08/2020   Normal LV Fxn - EF 60-65%. No RWMA. Mod Asymmetric Basal Septa LVH with mild Concentric LVH. Gr II DD. Normal Longitudinal Strain. Normal RV size & fxn - normal RAP/CVP.  Normal MV. Mild AoV sclerosis w/ stenosis. Mild Ascending Ao dilation - ~38 mm (borderline).  => No change from 04/2017.   TUBAL LIGATION Bilateral    UPPER GASTROINTESTINAL ENDOSCOPY     VAGINAL HYSTERECTOMY  2003   with LSO and right salpingectomy; right ovary still present   [facility] MONITOR-14-day  01/2021   Predominant rhythm sinus-heart rate range 40 to 124 bpm.  Average 70 bpm.  Rare PVCs and isolated PACs noted.  PAC couplets and triplets also noted as well as trigeminy.  3 atrial runs of 4-5 beats ranging from 102 to 150 bpm.  (2 of 3 noted on patient trigger.  Remainder patient triggers were sinus rhythm with PACs or PVCs); no atrial fibrillation or other sustained arrhythmias noted.   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   Ulcer of small intestine 09/06/2021   Acute blood loss anemia 09/06/2021   Iron deficiency 09/06/2021   Colon cancer screening 09/06/2021   Long term (current) use of antithrombotics/antiplatelets 09/06/2021   Jejunal ulcer 08/21/2021   Syncope and collapse 07/15/2021   Sinus bradycardia 11/03/2019   Atypical angina 10/12/2019   Fatigue 10/12/2019   Presence of 2 overlapping Drug Coated Stents in LAD  coronary artery    Coronary artery disease involving native coronary artery of native heart with angina pectoris 06/17/2019   DOE (dyspnea on exertion) 06/16/2019   CAD S/P DES PCI-proximal LAD 05/20/2019   Agatston coronary artery calcium  score greater than 400 04/16/2019   Migraine with vertigo 07/29/2018   Tinnitus of both ears 07/29/2018   Rapid palpitations 04/16/2017   Systolic murmur 04/16/2017   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   OSA on CPAP 11/19/2013   Hyperlipidemia with target LDL less than 70 07/29/2013   Obesity (BMI 30.0-34.9) 04/25/2012   Depression with anxiety 04/25/2012   Environmental allergies 04/25/2012   Essential (primary) hypertension 04/25/2012   Chiari malformation type I (HCC) 01/03/2012   Postmenopausal 09/18/2011    PCP: Loring Tanda Mae, MD   REFERRING PROVIDER:   Addie Cordella Hamilton, MD   *MD NOTE: PT 3x/wk for 6 wks for ROM and strengthening starting ASAP *  REFERRING DIAG: S03.347 (ICD-10-CM) - Status post total left knee replacement   THERAPY DIAG:  Chronic pain of left knee  Stiffness of left knee, not elsewhere  classified  Localized edema  Other abnormalities of gait and mobility  Left leg weakness  Rationale for Evaluation and Treatment: Rehabilitation  ONSET DATE: 06/23/24 surgery  L TKA  SUBJECTIVE:   SUBJECTIVE STATEMENT: Has missed some appts due to vertigo and staff illness.  Working on weaning from pain medication but still has to take some occasionally.   PERTINENT HISTORY: No previous L knee surgeries PAIN:  Are you having pain? Yes: NPRS scale: 1/10 Pain location: anterior, quad and laterally Pain description: achy, swelling Aggravating factors: nighttime, walking, standing Relieving factors: meds, ice  PRECAUTIONS: None  RED FLAGS: None   WEIGHT BEARING RESTRICTIONS: No  FALLS:  Has patient fallen in last 6 months? 2 due to vertigo issues  LIVING ENVIRONMENT: Lives with: lives with their spouse Lives in: House/apartment Stairs: Yes: Internal: 12 steps; on left going up Has following equipment at home: Single point cane  OCCUPATION: retired  PLOF: Independent  PATIENT GOALS: increase mobility.  NEXT MD VISIT: 07/28/23  OBJECTIVE:  Note: Objective measures were completed at Evaluation unless otherwise noted.  DIAGNOSTIC FINDINGS: 10/14/23- AP lateral merchant radiographs left knee reviewed.  Moderate to severe  arthritis is present in the lateral compartment with spurring on both the  femoral and tibial side.  Alignment intact.  Mild arthritis present in the  medial and patellofemoral compartments.  No acute fracture.   PATIENT SURVEYS:  PSFS: THE PATIENT SPECIFIC FUNCTIONAL SCALE  Place score of 0-10 (0 = unable to perform activity and 10 = able to perform activity at the same level as before injury or problem)  Activity Date: 07/21/24    driving 0    2.standing>5 min 3    3.ccoking small meal 1    Total Score 1.33      Total Score = Sum of activity scores/number of activities  Minimally Detectable Change: 3 points (for single activity); 2  points (for average score)  [facility] (nd). The Patient Specific Functional Scale . Retrieved from [email or web address]   COGNITION: Overall cognitive status: Within functional limits for tasks assessed     SENSATION: WFL  EDEMA:  Not assessed  MUSCLE LENGTH: Hamstrings: Left mod restriction  POSTURE: No Significant postural limitations  PALPATION: 1+ around knee jt line and incision  LOWER EXTREMITY ROM:  Active ROM Right eval Left eval Left 07/30/24  Knee flexion  82 Supine A: 80  AA: 87  Knee extension  4 from 0 supine A: -5 (seated LAQ) Supine AA: -1   (Blank rows = not tested)  LOWER EXTREMITY MMT:  MMT Right eval Left eval  Hip flexion  4+  Hip extension    Hip abduction  4+  Hip adduction    Hip internal rotation    Hip external rotation    Knee flexion  4  Knee extension  4-  Ankle dorsiflexion    Ankle plantarflexion    Ankle inversion    Ankle eversion     (Blank rows = not tested)  LOWER EXTREMITY SPECIAL TESTS:  N/A  FUNCTIONAL TESTS:  5 times sit to stand: 16 sec  GAIT: Distance walked: household Assistive device utilized: Single point cane Level of assistance: Complete Independence Comments: ---                                                                                                                                TREATMENT DATE:  07/31/24 TherAct NuStep L6 x 8 min; seat 10 working on strength, endurance and ROM AA heel slides with strap and ball x10 reps Bridges x 10 reps; 5 sec hold Mini squats x 10 reps Standing marching x 10 reps bil with bil UE support Calf raises x 10 reps  TherEx ROM measurements - see above for details Long sitting quad sets x10 reps Supine SLR x10 reps    07/21/24 Initial evaluation of L knee completed followed by instruction and trial set of HEP.    PATIENT EDUCATION:  Education details: HEP Person educated:  Patient Education method: Programmer, Multimedia, Facilities Manager, Actor cues, Verbal cues, and Handouts Education comprehension: verbalized understanding, returned demonstration, and verbal cues required  HOME EXERCISE PROGRAM: Access Code: [email or web address] URL: [email or web address] Date: 07/21/2024 Prepared by: Burnard Meth  Exercises - Supine Quad Set  - 2 x daily - 7 x weekly - 2 sets - 10 reps - 2 hold - Active Straight Leg Raise with Quad Set  - 2 x daily - 7 x weekly - 2 sets - 10 reps - Supine Heel Slide  - 2 x daily - 7 x weekly - 2 sets - 10 reps - Supine Bridge  - 1 x daily - 7 x weekly - 3 sets - 10 reps - Mini Squat with Counter Support  - 2 x daily - 7 x weekly - 2 sets - 10 reps - Standing March with Counter Support  - 2 x daily - 7 x weekly - 2 sets - 10 reps - Heel Raises with Counter Support  - 2 x daily - 7 x weekly - 2 sets - 10 reps  ASSESSMENT:  CLINICAL IMPRESSION: Session today focused on review of HEP and updating ROM measurements - good improvement in extension with some improvement in flexion.  Will continue to benefit from PT to maximize function.  OBJECTIVE IMPAIRMENTS: Abnormal gait, difficulty walking, decreased ROM, decreased strength, increased  edema, impaired flexibility, and pain.   ACTIVITY LIMITATIONS: bending, sitting, standing, squatting, sleeping, stairs, and transfers  PARTICIPATION LIMITATIONS: meal prep, driving, and community activity  PERSONAL FACTORS: 1-2 comorbidities: HTN and cardiac hx are also affecting patient's functional outcome.   REHAB POTENTIAL: Good  CLINICAL DECISION MAKING: Stable/uncomplicated  EVALUATION COMPLEXITY: Moderate   GOALS: Goals reviewed with patient? Yes  SHORT TERM GOALS: Target date: 08/11/2024 3 weeks    Pt to be independent with HEP. Baseline: Goal status: MET 07/31/24  2.  Decrease pain by 1 level. Baseline:  Goal status: INITIAL  3.  Improved knee flexion in swing phase gait. Baseline:  Goal  status: INITIAL  LONG TERM GOALS: Target date: 09/29/2024  10 weeks    Pt to be independent with self progressive HEP at discharge. Baseline:  Goal status: INITIAL  2.  Increase AROM of L knee to at least 0>105 Baseline:  Goal status: INITIAL  3.  Increase strength to at least 4/5 of L LE to enable independence with ADLs and ambulation. Baseline:  Goal status: INITIAL  4.  Pt able to ambulate short community distances without use of AD. Baseline:  Goal status: INITIAL  5.  Increase score of PSFS by at least 3 points to demonstrate measurable improvement. Baseline:  Goal status: INITIAL  6.  Decrease max pain to 2/10 with all activities. Baseline:  Goal status: INITIAL   PLAN:  PT FREQUENCY: 3x/week  PT DURATION: 10 weeks  PLANNED INTERVENTIONS: [id number]- PT Re-evaluation, 97750- Physical Performance Testing, 97110-Therapeutic exercises, 97530- Therapeutic activity, 97112- Neuromuscular re-education, 97535- Self Care, [id number]- Manual therapy, [phone]- Gait training, [phone]- Aquatic Therapy, [phone]- Electrical stimulation (unattended), 97016- Vasopneumatic device, 20560 (1-2 muscles), 20561 (3+ muscles)- Dry Needling, Patient/Family education, Balance training, Stair training, Joint mobilization, Scar mobilization, Cryotherapy, and Moist heat  PLAN FOR NEXT SESSION: continue to maximize quad strength and flexion focus, work on balance and strengthening as able    Corean JULIANNA Ku, PT, DPT 07/31/2024 11:41 AM  "

## 2024-08-03 ENCOUNTER — Ambulatory Visit: Admitting: Surgical

## 2024-08-04 ENCOUNTER — Encounter: Payer: Self-pay | Admitting: Physical Therapy

## 2024-08-04 ENCOUNTER — Ambulatory Visit (INDEPENDENT_AMBULATORY_CARE_PROVIDER_SITE_OTHER): Admitting: Physical Therapy

## 2024-08-04 DIAGNOSIS — R2689 Other abnormalities of gait and mobility: Secondary | ICD-10-CM

## 2024-08-04 DIAGNOSIS — M25562 Pain in left knee: Secondary | ICD-10-CM

## 2024-08-04 DIAGNOSIS — R29898 Other symptoms and signs involving the musculoskeletal system: Secondary | ICD-10-CM

## 2024-08-04 DIAGNOSIS — R600 Localized edema: Secondary | ICD-10-CM

## 2024-08-04 DIAGNOSIS — G8929 Other chronic pain: Secondary | ICD-10-CM

## 2024-08-04 DIAGNOSIS — M25662 Stiffness of left knee, not elsewhere classified: Secondary | ICD-10-CM | POA: Diagnosis not present

## 2024-08-04 NOTE — Therapy (Signed)
 " OUTPATIENT PHYSICAL THERAPY TREATMENT   Patient Name: Carrie Reynolds MRN: [id number] DOB:[birth year], 69 y.o., female Today's Date: 08/04/2024  END OF SESSION:  PT End of Session - 08/04/24 1444     Visit Number 3    Number of Visits 31    Date for Recertification  09/30/23    Authorization Type UHC/Medicare    Progress Note Due on Visit 10    PT Start Time 1433    PT Stop Time 1514    PT Time Calculation (min) 41 min    Activity Tolerance Patient tolerated treatment well    Behavior During Therapy [facility] for tasks assessed/performed            Past Medical History:  Diagnosis Date   Anxiety    Arnold-Chiari malformation (HCC) 2006   Arthritis    CAD S/P PCI 05/06/2019   05/06/2019: prox LAD (@ SPI) PCI with Resolute Onyx DES 3.5  x 8, Residual 60% PDA, Normal LVF; 07/02/2019: relook Cath for Anterior Ischemia on Mhoview --> CATH 70% pre-stent/proximal edge dissection --> proximal overlapping DES (Resolute Onyx 3.5 x 8 - new & old stent post-dilated to 3.8 mm)   Cervical strain    syndrome   Cervicogenic headache    Chronic kidney disease    Concussion with loss of consciousness 10/09/2017   CTS (carpal tunnel syndrome)    Depression    GERD (gastroesophageal reflux disease)    Glucose intolerance (impaired glucose tolerance)    High coronary artery calcium  score    Score of 1460.  Coronary CTA shows high-grade proximal-mid LAD stenosis (CT FFR 0.50).  Also PDA/PLA 60 to 70% stenosis (CTO FFR 0.75)   History of hiatal hernia    History of kidney stones    Hypertension    Migraine headache    with visual aura   Obesity    OSA (obstructive sleep apnea)    not treated   Postmenopausal    Sleep apnea    Vertigo    post concussive   Past Surgical History:  Procedure Laterality Date   BIOPSY  07/15/2021   Procedure: BIOPSY;  Surgeon: Wilhelmenia Aloha Raddle., MD;  Location: [facility] ENDOSCOPY;  Service: Gastroenterology;;   [name]     right great toe    CHOLECYSTECTOMY N/A 06/05/2018   Procedure: LAPAROSCOPIC CHOLECYSTECTOMY WITH POSSIBLE INTRAOPERATIVE CHOLANGIOGRAM;  Surgeon: Vanderbilt Ned, MD;  Location: [facility] OR;  Service: General;  Laterality: N/A;   CORONARY STENT INTERVENTION N/A 05/12/2019   Procedure: CORONARY STENT INTERVENTION;  Surgeon: Anner Alm ORN, MD;  Location: [facility] INVASIVE CV LAB;  Service: Cardiovascular;;; Culprit-p-mLAD 95% 950% SP1) --> Scoring Balloon PTCA -> DES PCI (Resolute DES 3.5 x 38 - 3.8 mm; 0% LAD& 55% SP1). -->  SP1 was somewhat jailed with TIMI II flow post PCI.   CORONARY STENT INTERVENTION N/A 07/02/2019   Procedure: CORONARY STENT INTERVENTION;  Surgeon: Anner Alm ORN, MD;  Location: [facility] INVASIVE CV LAB;  Service: Cardiovascular;; * 70% Proximal stent edge dissection (edge of previous stent that is patent) -->  successfully covered with additional RESOLUTE ONYX DES 3.5 mm x 8 mm overlapping proximal edge of previous stent (postdilated to 3.8 mm)   ENTEROSCOPY N/A 07/15/2021   Procedure: ENTEROSCOPY;  Surgeon: Wilhelmenia Aloha Raddle., MD;  Location: [facility] ENDOSCOPY;  Service: Gastroenterology;  Laterality: N/A;   GASTRIC BYPASS  11/2015   [facility]   GYNECOLOGIC CRYOSURGERY     HAND SURGERY     HIATAL HERNIA REPAIR  01/01/2024   in [place]    INJECTION KNEE Right 06/23/2024   Procedure: INJECTION, KNEE;  Surgeon: Addie Cordella Hamilton, MD;  Location: [facility] OR;  Service: Orthopedics;  Laterality: Right;   LEFT HEART CATH AND CORONARY ANGIOGRAPHY N/A 05/12/2019   Procedure: LEFT HEART CATH AND CORONARY ANGIOGRAPHY;  Surgeon: Anner Alm ORN, MD;  Location: [facility] INVASIVE CV LAB;  Service: Cardiovascular;;; Culprit-p-mLAD 95% 950% SP1) --> DES PCI.;  RPAV 60% -small caliber vessel, did not appear flow-limiting (medical management).  EF 55 to 60%.  Normal LVEDP.   LEFT HEART CATH AND CORONARY ANGIOGRAPHY N/A 07/02/2019   Procedure: LEFT HEART CATH AND CORONARY ANGIOGRAPHY;  Surgeon: Anner Alm ORN, MD;  Location: [facility]  INVASIVE CV LAB;  Service: Cardiovascular;; CULPRIT = ~70% proximal edge dissection of previous stent (DES PCI overlapping stent placed) - jails SP1 w/ ~60-70% ostial stenosis.SABRA mLAD 25%. RI 30%, RPAV 60%.   LEFT HEART CATH AND CORONARY ANGIOGRAPHY N/A 03/30/2022   Procedure: LEFT HEART CATH AND CORONARY ANGIOGRAPHY;  Surgeon: Wendel Lurena POUR, MD;  Location: [facility] INVASIVE CV LAB;  Service: Cardiovascular;  Laterality: N/A;   NM MYOVIEW  LTD  06/24/2019   (Lexiscan ): HIGH RISK.  EF 55%.  Large size, moderate severe defect in the anterior wall with associated anterior hypokinesis.  Suspect LAD territory.   NM MYOVIEW  LTD  10/23/2019   Normal EF 55 to 60%.  No EKG changes.  Small size mild severity fixed defect in the apical wall consistent with breast attenuation versus small prior infarct.  Marked improvement from anterior apical perfusion abnormality seen in February 2020.   TOTAL KNEE ARTHROPLASTY Left 06/23/2024   Procedure: ARTHROPLASTY, KNEE, TOTAL;  Surgeon: Addie Cordella Hamilton, MD;  Location: [facility] OR;  Service: Orthopedics;  Laterality: Left;   TRANSTHORACIC ECHOCARDIOGRAM  12/08/2020   Normal LV Fxn - EF 60-65%. No RWMA. Mod Asymmetric Basal Septa LVH with mild Concentric LVH. Gr II DD. Normal Longitudinal Strain. Normal RV size & fxn - normal RAP/CVP.  Normal MV. Mild AoV sclerosis w/ stenosis. Mild Ascending Ao dilation - ~38 mm (borderline).  => No change from 04/2017.   TUBAL LIGATION Bilateral    UPPER GASTROINTESTINAL ENDOSCOPY     VAGINAL HYSTERECTOMY  2003   with LSO and right salpingectomy; right ovary still present   [facility] MONITOR-14-day  01/2021   Predominant rhythm sinus-heart rate range 40 to 124 bpm.  Average 70 bpm.  Rare PVCs and isolated PACs noted.  PAC couplets and triplets also noted as well as trigeminy.  3 atrial runs of 4-5 beats ranging from 102 to 150 bpm.  (2 of 3 noted on patient trigger.  Remainder patient triggers were sinus rhythm with PACs or PVCs); no atrial  fibrillation or other sustained arrhythmias noted.   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   Ulcer of small intestine 09/06/2021   Acute blood loss anemia 09/06/2021   Iron deficiency 09/06/2021   Colon cancer screening 09/06/2021   Long term (current) use of antithrombotics/antiplatelets 09/06/2021   Jejunal ulcer 08/21/2021   Syncope and collapse 07/15/2021   Sinus bradycardia 11/03/2019   Atypical angina 10/12/2019  Fatigue 10/12/2019   Presence of 2 overlapping Drug Coated Stents in LAD coronary artery    Coronary artery disease involving native coronary artery of native heart with angina pectoris 06/17/2019   DOE (dyspnea on exertion) 06/16/2019   CAD S/P DES PCI-proximal LAD 05/20/2019   Agatston coronary artery calcium  score greater than 400 04/16/2019   Migraine with vertigo 07/29/2018   Tinnitus of both ears 07/29/2018   Rapid palpitations 04/16/2017   Systolic murmur 04/16/2017   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   OSA on CPAP 11/19/2013   Hyperlipidemia with target LDL less than 70 07/29/2013   Obesity (BMI 30.0-34.9) 04/25/2012   Depression with anxiety 04/25/2012   Environmental allergies 04/25/2012   Essential (primary) hypertension 04/25/2012   Chiari malformation type I (HCC) 01/03/2012   Postmenopausal 09/18/2011    PCP: Loring Tanda Mae, MD   REFERRING PROVIDER:   Addie Cordella Hamilton, MD   *MD NOTE: PT 3x/wk for 6 wks for ROM and strengthening starting ASAP *  REFERRING DIAG: S03.347 (ICD-10-CM) - Status post total left knee replacement   THERAPY DIAG:   Chronic pain of left knee  Stiffness of left knee, not elsewhere classified  Localized edema  Other abnormalities of gait and mobility  Left leg weakness  Rationale for Evaluation and Treatment: Rehabilitation  ONSET DATE: 06/23/24 surgery  L TKA  SUBJECTIVE:   SUBJECTIVE STATEMENT:   Doing OK, had that setback from my vertigo. Glad I didn't look this surgery up online before I had it but glad I did it.     PERTINENT HISTORY: No previous L knee surgeries  PAIN:  Are you having pain? Yes: NPRS scale: 2/10 Pain location: anterior, quad and laterally Pain description: achy, swelling, thick and tight  Aggravating factors: nighttime, walking, standing Relieving factors: meds, ice  PRECAUTIONS: None  RED FLAGS: None   WEIGHT BEARING RESTRICTIONS: No  FALLS:  Has patient fallen in last 6 months? 2 due to vertigo issues  LIVING ENVIRONMENT: Lives with: lives with their spouse Lives in: House/apartment Stairs: Yes: Internal: 12 steps; on left going up Has following equipment at home: Single point cane  OCCUPATION: retired  PLOF: Independent  PATIENT GOALS: increase mobility.  NEXT MD VISIT: 07/28/23  OBJECTIVE:  Note: Objective measures were completed at Evaluation unless otherwise noted.  DIAGNOSTIC FINDINGS: 10/14/23- AP lateral merchant radiographs left knee reviewed.  Moderate to severe  arthritis is present in the lateral compartment with spurring on both the  femoral and tibial side.  Alignment intact.  Mild arthritis present in the  medial and patellofemoral compartments.  No acute fracture.   PATIENT SURVEYS:  PSFS: THE PATIENT SPECIFIC FUNCTIONAL SCALE  Place score of 0-10 (0 = unable to perform activity and 10 = able to perform activity at the same level as before injury or problem)  Activity Date: 07/21/24    driving 0    2.standing>5 min 3    3.ccoking small meal 1    Total Score 1.33      Total Score = Sum of activity scores/number of  activities  Minimally Detectable Change: 3 points (for single activity); 2 points (for average score)  [facility] (nd). The Patient Specific Functional Scale . Retrieved from [email or web address]   COGNITION: Overall cognitive status: Within functional limits for tasks assessed     SENSATION: WFL  EDEMA:  Not assessed  MUSCLE LENGTH: Hamstrings: Left mod restriction  POSTURE: No Significant postural limitations  PALPATION: 1+ around knee jt line  and incision  LOWER EXTREMITY ROM:  Active ROM Right eval Left eval Left 07/30/24 L 08/04/24  Knee flexion  82 Supine A: 80 AA: 87 AAROM 90*  Knee extension  4 from 0 supine A: -5 (seated LAQ) Supine AA: -1    (Blank rows = not tested)  LOWER EXTREMITY MMT:  MMT Right eval Left eval  Hip flexion  4+  Hip extension    Hip abduction  4+  Hip adduction    Hip internal rotation    Hip external rotation    Knee flexion  4  Knee extension  4-  Ankle dorsiflexion    Ankle plantarflexion    Ankle inversion    Ankle eversion     (Blank rows = not tested)  LOWER EXTREMITY SPECIAL TESTS:  N/A  FUNCTIONAL TESTS:  5 times sit to stand: 16 sec  GAIT: Distance walked: household Assistive device utilized: Single point cane Level of assistance: Complete Independence Comments: ---                                                                                                                                TREATMENT DATE:   08/04/24  Nustep L2x8 minutes seat 9 for ROM, w/u   Knee flexion stretch AAROM 12x3 seconds LAQs 2# 12x3 second holds Bridges + progressive knee flexion 3x5  Supine SLRs 2# x12  Education on vertigo- role of PT in intervening but would need a formal order for us  to work on this here  Percussion gun middle and lateral quad  Knee flexion OP seated edge of mat table      07/31/24 TherAct NuStep L6 x 8 min; seat 10 working on  strength, endurance and ROM AA heel slides with strap and ball x10 reps Bridges x 10 reps; 5 sec hold Mini squats x 10 reps Standing marching x 10 reps bil with bil UE support Calf raises x 10 reps  TherEx ROM measurements - see above for details Long sitting quad sets x10 reps Supine SLR x10 reps    07/21/24 Initial evaluation of L knee completed followed by instruction and trial set of HEP.    PATIENT EDUCATION:  Education details: HEP Person educated: Patient Education method: Programmer, Multimedia, Facilities Manager, Actor cues, Verbal cues, and Handouts Education comprehension: verbalized understanding, returned demonstration, and verbal cues required  HOME EXERCISE PROGRAM: Access Code: [email or web address] URL: [email or web address] Date: 07/21/2024 Prepared by: Burnard Meth  Exercises - Supine Quad Set  - 2 x daily - 7 x weekly - 2 sets - 10 reps - 2 hold - Active Straight Leg Raise with Quad Set  - 2 x daily - 7 x weekly - 2 sets - 10 reps - Supine Heel Slide  - 2 x daily - 7 x weekly - 2 sets - 10 reps - Supine Bridge  - 1 x daily - 7 x weekly - 3 sets - 10 reps - Mini Squat with Counter  Support  - 2 x daily - 7 x weekly - 2 sets - 10 reps - Standing March with Counter Support  - 2 x daily - 7 x weekly - 2 sets - 10 reps - Heel Raises with Counter Support  - 2 x daily - 7 x weekly - 2 sets - 10 reps  ASSESSMENT:  CLINICAL IMPRESSION:  Arrives today doing OK, we continued working on ROM and functional strengthening this afternoon. Also continued challenging quad strength as able. Did our best to avoid triggering vertigo this visit. Flexion continues to slowly improve.       OBJECTIVE IMPAIRMENTS: Abnormal gait, difficulty walking, decreased ROM, decreased strength, increased edema, impaired flexibility, and pain.   ACTIVITY LIMITATIONS: bending, sitting, standing, squatting, sleeping, stairs, and transfers  PARTICIPATION LIMITATIONS: meal prep, driving, and community  activity  PERSONAL FACTORS: 1-2 comorbidities: HTN and cardiac hx are also affecting patient's functional outcome.   REHAB POTENTIAL: Good  CLINICAL DECISION MAKING: Stable/uncomplicated  EVALUATION COMPLEXITY: Moderate   GOALS: Goals reviewed with patient? Yes  SHORT TERM GOALS: Target date: 08/11/2024 3 weeks    Pt to be independent with HEP. Baseline: Goal status: MET 07/31/24  2.  Decrease pain by 1 level. Baseline:  Goal status: INITIAL  3.  Improved knee flexion in swing phase gait. Baseline:  Goal status: INITIAL  LONG TERM GOALS: Target date: 09/29/2024  10 weeks    Pt to be independent with self progressive HEP at discharge. Baseline:  Goal status: INITIAL  2.  Increase AROM of L knee to at least 0>105 Baseline:  Goal status: INITIAL  3.  Increase strength to at least 4/5 of L LE to enable independence with ADLs and ambulation. Baseline:  Goal status: INITIAL  4.  Pt able to ambulate short community distances without use of AD. Baseline:  Goal status: INITIAL  5.  Increase score of PSFS by at least 3 points to demonstrate measurable improvement. Baseline:  Goal status: INITIAL  6.  Decrease max pain to 2/10 with all activities. Baseline:  Goal status: INITIAL   PLAN:  PT FREQUENCY: 3x/week  PT DURATION: 10 weeks  PLANNED INTERVENTIONS: [id number]- PT Re-evaluation, 97750- Physical Performance Testing, 97110-Therapeutic exercises, 97530- Therapeutic activity, [id number]- Neuromuscular re-education, 97535- Self Care, [id number]- Manual therapy, [phone]- Gait training, [phone]- Aquatic Therapy, [phone]- Electrical stimulation (unattended), 97016- Vasopneumatic device, 20560 (1-2 muscles), 20561 (3+ muscles)- Dry Needling, Patient/Family education, Balance training, Stair training, Joint mobilization, Scar mobilization, Cryotherapy, and Moist heat  PLAN FOR NEXT SESSION: continue to maximize quad strength and flexion focus, work on balance and strengthening as able.  Caution due to positional vertigo   Josette Rough, PT, DPT 08/04/2024 3:15 PM  "

## 2024-08-05 ENCOUNTER — Ambulatory Visit: Admitting: Physical Therapy

## 2024-08-05 DIAGNOSIS — R2689 Other abnormalities of gait and mobility: Secondary | ICD-10-CM | POA: Diagnosis not present

## 2024-08-05 DIAGNOSIS — G8929 Other chronic pain: Secondary | ICD-10-CM | POA: Diagnosis not present

## 2024-08-05 DIAGNOSIS — R600 Localized edema: Secondary | ICD-10-CM | POA: Diagnosis not present

## 2024-08-05 DIAGNOSIS — R29898 Other symptoms and signs involving the musculoskeletal system: Secondary | ICD-10-CM

## 2024-08-05 DIAGNOSIS — M25662 Stiffness of left knee, not elsewhere classified: Secondary | ICD-10-CM | POA: Diagnosis not present

## 2024-08-05 DIAGNOSIS — M25562 Pain in left knee: Secondary | ICD-10-CM

## 2024-08-05 NOTE — Therapy (Signed)
 " OUTPATIENT PHYSICAL THERAPY TREATMENT   Patient Name: Carrie Reynolds MRN: [id number] DOB:[birth year], 69 y.o., female Today's Date: 08/05/2024  END OF SESSION:  PT End of Session - 08/05/24 1059     Visit Number 4    Number of Visits 31    Date for Recertification  09/30/23    Authorization Type UHC/Medicare    Progress Note Due on Visit 10    PT Start Time 1100    PT Stop Time 1140    PT Time Calculation (min) 40 min    Activity Tolerance Patient tolerated treatment well    Behavior During Therapy [facility] for tasks assessed/performed           Past Medical History:  Diagnosis Date   Anxiety    Arnold-Chiari malformation (HCC) 2006   Arthritis    CAD S/P PCI 05/06/2019   05/06/2019: prox LAD (@ SPI) PCI with Resolute Onyx DES 3.5  x 8, Residual 60% PDA, Normal LVF; 07/02/2019: relook Cath for Anterior Ischemia on Mhoview --> CATH 70% pre-stent/proximal edge dissection --> proximal overlapping DES (Resolute Onyx 3.5 x 8 - new & old stent post-dilated to 3.8 mm)   Cervical strain    syndrome   Cervicogenic headache    Chronic kidney disease    Concussion with loss of consciousness 10/09/2017   CTS (carpal tunnel syndrome)    Depression    GERD (gastroesophageal reflux disease)    Glucose intolerance (impaired glucose tolerance)    High coronary artery calcium  score    Score of 1460.  Coronary CTA shows high-grade proximal-mid LAD stenosis (CT FFR 0.50).  Also PDA/PLA 60 to 70% stenosis (CTO FFR 0.75)   History of hiatal hernia    History of kidney stones    Hypertension    Migraine headache    with visual aura   Obesity    OSA (obstructive sleep apnea)    not treated   Postmenopausal    Sleep apnea    Vertigo    post concussive   Past Surgical History:  Procedure Laterality Date   BIOPSY  07/15/2021   Procedure: BIOPSY;  Surgeon: Wilhelmenia Aloha Raddle., MD;  Location: [facility] ENDOSCOPY;  Service: Gastroenterology;;   [name]     right great toe    CHOLECYSTECTOMY N/A 06/05/2018   Procedure: LAPAROSCOPIC CHOLECYSTECTOMY WITH POSSIBLE INTRAOPERATIVE CHOLANGIOGRAM;  Surgeon: Vanderbilt Ned, MD;  Location: [facility] OR;  Service: General;  Laterality: N/A;   CORONARY STENT INTERVENTION N/A 05/12/2019   Procedure: CORONARY STENT INTERVENTION;  Surgeon: Anner Alm ORN, MD;  Location: [facility] INVASIVE CV LAB;  Service: Cardiovascular;;; Culprit-p-mLAD 95% 950% SP1) --> Scoring Balloon PTCA -> DES PCI (Resolute DES 3.5 x 38 - 3.8 mm; 0% LAD& 55% SP1). -->  SP1 was somewhat jailed with TIMI II flow post PCI.   CORONARY STENT INTERVENTION N/A 07/02/2019   Procedure: CORONARY STENT INTERVENTION;  Surgeon: Anner Alm ORN, MD;  Location: [facility] INVASIVE CV LAB;  Service: Cardiovascular;; * 70% Proximal stent edge dissection (edge of previous stent that is patent) -->  successfully covered with additional RESOLUTE ONYX DES 3.5 mm x 8 mm overlapping proximal edge of previous stent (postdilated to 3.8 mm)   ENTEROSCOPY N/A 07/15/2021   Procedure: ENTEROSCOPY;  Surgeon: Wilhelmenia Aloha Raddle., MD;  Location: [facility] ENDOSCOPY;  Service: Gastroenterology;  Laterality: N/A;   GASTRIC BYPASS  11/2015   [facility]   GYNECOLOGIC CRYOSURGERY     HAND SURGERY     HIATAL HERNIA REPAIR  01/01/2024  in [place]    INJECTION KNEE Right 06/23/2024   Procedure: INJECTION, KNEE;  Surgeon: Addie Cordella Hamilton, MD;  Location: [facility] OR;  Service: Orthopedics;  Laterality: Right;   LEFT HEART CATH AND CORONARY ANGIOGRAPHY N/A 05/12/2019   Procedure: LEFT HEART CATH AND CORONARY ANGIOGRAPHY;  Surgeon: Anner Alm ORN, MD;  Location: [facility] INVASIVE CV LAB;  Service: Cardiovascular;;; Culprit-p-mLAD 95% 950% SP1) --> DES PCI.;  RPAV 60% -small caliber vessel, did not appear flow-limiting (medical management).  EF 55 to 60%.  Normal LVEDP.   LEFT HEART CATH AND CORONARY ANGIOGRAPHY N/A 07/02/2019   Procedure: LEFT HEART CATH AND CORONARY ANGIOGRAPHY;  Surgeon: Anner Alm ORN, MD;  Location: [facility]  INVASIVE CV LAB;  Service: Cardiovascular;; CULPRIT = ~70% proximal edge dissection of previous stent (DES PCI overlapping stent placed) - jails SP1 w/ ~60-70% ostial stenosis.SABRA mLAD 25%. RI 30%, RPAV 60%.   LEFT HEART CATH AND CORONARY ANGIOGRAPHY N/A 03/30/2022   Procedure: LEFT HEART CATH AND CORONARY ANGIOGRAPHY;  Surgeon: Wendel Lurena POUR, MD;  Location: [facility] INVASIVE CV LAB;  Service: Cardiovascular;  Laterality: N/A;   NM MYOVIEW  LTD  06/24/2019   (Lexiscan ): HIGH RISK.  EF 55%.  Large size, moderate severe defect in the anterior wall with associated anterior hypokinesis.  Suspect LAD territory.   NM MYOVIEW  LTD  10/23/2019   Normal EF 55 to 60%.  No EKG changes.  Small size mild severity fixed defect in the apical wall consistent with breast attenuation versus small prior infarct.  Marked improvement from anterior apical perfusion abnormality seen in February 2020.   TOTAL KNEE ARTHROPLASTY Left 06/23/2024   Procedure: ARTHROPLASTY, KNEE, TOTAL;  Surgeon: Addie Cordella Hamilton, MD;  Location: [facility] OR;  Service: Orthopedics;  Laterality: Left;   TRANSTHORACIC ECHOCARDIOGRAM  12/08/2020   Normal LV Fxn - EF 60-65%. No RWMA. Mod Asymmetric Basal Septa LVH with mild Concentric LVH. Gr II DD. Normal Longitudinal Strain. Normal RV size & fxn - normal RAP/CVP.  Normal MV. Mild AoV sclerosis w/ stenosis. Mild Ascending Ao dilation - ~38 mm (borderline).  => No change from 04/2017.   TUBAL LIGATION Bilateral    UPPER GASTROINTESTINAL ENDOSCOPY     VAGINAL HYSTERECTOMY  2003   with LSO and right salpingectomy; right ovary still present   [facility] MONITOR-14-day  01/2021   Predominant rhythm sinus-heart rate range 40 to 124 bpm.  Average 70 bpm.  Rare PVCs and isolated PACs noted.  PAC couplets and triplets also noted as well as trigeminy.  3 atrial runs of 4-5 beats ranging from 102 to 150 bpm.  (2 of 3 noted on patient trigger.  Remainder patient triggers were sinus rhythm with PACs or PVCs); no atrial  fibrillation or other sustained arrhythmias noted.   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   Ulcer of small intestine 09/06/2021   Acute blood loss anemia 09/06/2021   Iron deficiency 09/06/2021   Colon cancer screening 09/06/2021   Long term (current) use of antithrombotics/antiplatelets 09/06/2021   Jejunal ulcer 08/21/2021   Syncope and collapse 07/15/2021   Sinus bradycardia 11/03/2019   Atypical angina 10/12/2019   Fatigue 10/12/2019  Presence of 2 overlapping Drug Coated Stents in LAD coronary artery    Coronary artery disease involving native coronary artery of native heart with angina pectoris 06/17/2019   DOE (dyspnea on exertion) 06/16/2019   CAD S/P DES PCI-proximal LAD 05/20/2019   Agatston coronary artery calcium  score greater than 400 04/16/2019   Migraine with vertigo 07/29/2018   Tinnitus of both ears 07/29/2018   Rapid palpitations 04/16/2017   Systolic murmur 04/16/2017   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   OSA on CPAP 11/19/2013   Hyperlipidemia with target LDL less than 70 07/29/2013   Obesity (BMI 30.0-34.9) 04/25/2012   Depression with anxiety 04/25/2012   Environmental allergies 04/25/2012   Essential (primary) hypertension 04/25/2012   Chiari malformation type I (HCC) 01/03/2012   Postmenopausal 09/18/2011    PCP: Loring Tanda Mae, MD   REFERRING PROVIDER:   Addie Cordella Hamilton, MD   *MD NOTE: PT 3x/wk for 6 wks for ROM and strengthening starting ASAP *  REFERRING DIAG: S03.347 (ICD-10-CM) - Status post total left knee replacement   THERAPY DIAG:  No  diagnosis found.  Rationale for Evaluation and Treatment: Rehabilitation  ONSET DATE: 06/23/24 surgery  L TKA  SUBJECTIVE:   SUBJECTIVE STATEMENT: Pt reports a lot of difficulty last night with soreness and pain. Felt good initially after yesterday's session for an hour or so but states she tried to keep doing exercises throughout the day.    PERTINENT HISTORY: No previous L knee surgeries  PAIN:  Are you having pain? Yes: NPRS scale: 2/10 Pain location: anterior, quad and laterally Pain description: achy, swelling, thick and tight  Aggravating factors: nighttime, walking, standing Relieving factors: meds, ice  PRECAUTIONS: None  RED FLAGS: None   WEIGHT BEARING RESTRICTIONS: No  FALLS:  Has patient fallen in last 6 months? 2 due to vertigo issues  LIVING ENVIRONMENT: Lives with: lives with their spouse Lives in: House/apartment Stairs: Yes: Internal: 12 steps; on left going up Has following equipment at home: Single point cane  OCCUPATION: retired  PLOF: Independent  PATIENT GOALS: increase mobility.  NEXT MD VISIT: 07/28/23  OBJECTIVE:  Note: Objective measures were completed at Evaluation unless otherwise noted.  DIAGNOSTIC FINDINGS: 10/14/23- AP lateral merchant radiographs left knee reviewed.  Moderate to severe  arthritis is present in the lateral compartment with spurring on both the  femoral and tibial side.  Alignment intact.  Mild arthritis present in the  medial and patellofemoral compartments.  No acute fracture.   PATIENT SURVEYS:  PSFS: THE PATIENT SPECIFIC FUNCTIONAL SCALE  Place score of 0-10 (0 = unable to perform activity and 10 = able to perform activity at the same level as before injury or problem)  Activity Date: 07/21/24    driving 0    2.standing>5 min 3    3.ccoking small meal 1    Total Score 1.33      Total Score = Sum of activity scores/number of activities  Minimally Detectable Change: 3 points (for single activity); 2  points (for average score)  [facility] (nd). The Patient Specific Functional Scale . Retrieved from [email or web address]   COGNITION: Overall cognitive status: Within functional limits for tasks assessed     SENSATION: WFL  EDEMA:  Not assessed  MUSCLE LENGTH: Hamstrings: Left mod restriction  POSTURE: No Significant postural limitations  PALPATION: 1+ around knee jt line and incision  LOWER EXTREMITY ROM:  Active ROM Right eval Left eval Left 07/30/24 L 08/04/24  Knee flexion  82  Supine A: 80 AA: 87 AAROM 90*  Knee extension  4 from 0 supine A: -5 (seated LAQ) Supine AA: -1    (Blank rows = not tested)  LOWER EXTREMITY MMT:  MMT Right eval Left eval  Hip flexion  4+  Hip extension    Hip abduction  4+  Hip adduction    Hip internal rotation    Hip external rotation    Knee flexion  4  Knee extension  4-  Ankle dorsiflexion    Ankle plantarflexion    Ankle inversion    Ankle eversion     (Blank rows = not tested)  LOWER EXTREMITY SPECIAL TESTS:  N/A  FUNCTIONAL TESTS:  5 times sit to stand: 16 sec  GAIT: Distance walked: household Assistive device utilized: Single point cane Level of assistance: Complete Independence Comments: ---                                                                                                                                TREATMENT DATE:  08/05/24 Sitting heel slide x 4 min with self assist  Sitting heel/toe raise 2x10 Sitting adductor stretch x 30 Sitting hamstring stretch 2x 30 Supine quad/flexor stretch with strap 2x30 Supine ITB stretch with strap 2x30 Supine quad set 2x10 Supine SAQ 2x10 Supine heel slides with strap 10x5  Nustep L5; 2 min each starting at seat 11 and then making way to seat 8  Vaso x 15 min R knee, medium compression, 34 deg, elevated on bolster  08/04/24 Nustep L2x8 minutes seat 9 for ROM, w/u   Knee flexion  stretch AAROM 12x3 seconds LAQs 2# 12x3 second holds Bridges + progressive knee flexion 3x5  Supine SLRs 2# x12  Education on vertigo- role of PT in intervening but would need a formal order for us  to work on this here  Percussion gun middle and lateral quad  Knee flexion OP seated edge of mat table      07/31/24 TherAct NuStep L6 x 8 min; seat 10 working on strength, endurance and ROM AA heel slides with strap and ball x10 reps Bridges x 10 reps; 5 sec hold Mini squats x 10 reps Standing marching x 10 reps bil with bil UE support Calf raises x 10 reps  TherEx ROM measurements - see above for details Long sitting quad sets x10 reps Supine SLR x10 reps    07/21/24 Initial evaluation of L knee completed followed by instruction and trial set of HEP.    PATIENT EDUCATION:  Education details: HEP Person educated: Patient Education method: Programmer, Multimedia, Facilities Manager, Actor cues, Verbal cues, and Handouts Education comprehension: verbalized understanding, returned demonstration, and verbal cues required  HOME EXERCISE PROGRAM: Access Code: [email or web address] URL: [email or web address] Date: 07/21/2024 Prepared by: Burnard Meth  Exercises - Supine Quad Set  - 2 x daily - 7 x weekly - 2 sets - 10 reps - 2 hold - Active Straight Leg Raise with Quad  Set  - 2 x daily - 7 x weekly - 2 sets - 10 reps - Supine Heel Slide  - 2 x daily - 7 x weekly - 2 sets - 10 reps - Supine Bridge  - 1 x daily - 7 x weekly - 3 sets - 10 reps - Mini Squat with Counter Support  - 2 x daily - 7 x weekly - 2 sets - 10 reps - Standing March with Counter Support  - 2 x daily - 7 x weekly - 2 sets - 10 reps - Heel Raises with Counter Support  - 2 x daily - 7 x weekly - 2 sets - 10 reps  ASSESSMENT:  CLINICAL IMPRESSION: Treatment continues to focus on improving strength and ROM. Pt very sore from yesterday's session. Eased up on strengthening today and focused on active recovery and stretching.      OBJECTIVE IMPAIRMENTS: Abnormal gait, difficulty walking, decreased ROM, decreased strength, increased edema, impaired flexibility, and pain.   ACTIVITY LIMITATIONS: bending, sitting, standing, squatting, sleeping, stairs, and transfers  PARTICIPATION LIMITATIONS: meal prep, driving, and community activity  PERSONAL FACTORS: 1-2 comorbidities: HTN and cardiac hx are also affecting patient's functional outcome.   REHAB POTENTIAL: Good  CLINICAL DECISION MAKING: Stable/uncomplicated  EVALUATION COMPLEXITY: Moderate   GOALS: Goals reviewed with patient? Yes  SHORT TERM GOALS: Target date: 08/11/2024 3 weeks    Pt to be independent with HEP. Baseline: Goal status: MET 07/31/24  2.  Decrease pain by 1 level. Baseline:  Goal status: INITIAL  3.  Improved knee flexion in swing phase gait. Baseline:  Goal status: INITIAL  LONG TERM GOALS: Target date: 09/29/2024  10 weeks    Pt to be independent with self progressive HEP at discharge. Baseline:  Goal status: INITIAL  2.  Increase AROM of L knee to at least 0>105 Baseline:  Goal status: INITIAL  3.  Increase strength to at least 4/5 of L LE to enable independence with ADLs and ambulation. Baseline:  Goal status: INITIAL  4.  Pt able to ambulate short community distances without use of AD. Baseline:  Goal status: INITIAL  5.  Increase score of PSFS by at least 3 points to demonstrate measurable improvement. Baseline:  Goal status: INITIAL  6.  Decrease max pain to 2/10 with all activities. Baseline:  Goal status: INITIAL   PLAN:  PT FREQUENCY: 3x/week  PT DURATION: 10 weeks  PLANNED INTERVENTIONS: [id number]- PT Re-evaluation, 97750- Physical Performance Testing, 97110-Therapeutic exercises, 97530- Therapeutic activity, [id number]- Neuromuscular re-education, 97535- Self Care, [id number]- Manual therapy, [phone]- Gait training, [phone]- Aquatic Therapy, [phone]- Electrical stimulation (unattended), 97016- Vasopneumatic device,  20560 (1-2 muscles), 20561 (3+ muscles)- Dry Needling, Patient/Family education, Balance training, Stair training, Joint mobilization, Scar mobilization, Cryotherapy, and Moist heat  PLAN FOR NEXT SESSION: continue to maximize quad strength and flexion focus, work on balance and strengthening as able. Caution due to positional vertigo   Azaiah Licciardi April Ma L Tyden Kann, PT, DPT 08/05/2024 10:59 AM  "

## 2024-08-06 ENCOUNTER — Telehealth: Payer: Self-pay | Admitting: Rehabilitative and Restorative Service Providers"

## 2024-08-06 ENCOUNTER — Other Ambulatory Visit: Payer: Self-pay

## 2024-08-06 ENCOUNTER — Encounter: Admitting: Rehabilitative and Restorative Service Providers"

## 2024-08-06 ENCOUNTER — Emergency Department (HOSPITAL_COMMUNITY)

## 2024-08-06 ENCOUNTER — Emergency Department (HOSPITAL_COMMUNITY)
Admission: EM | Admit: 2024-08-06 | Discharge: 2024-08-06 | Disposition: A | Discharge status: Home / Self Care | Attending: Emergency Medicine | Admitting: Emergency Medicine

## 2024-08-06 ENCOUNTER — Encounter (HOSPITAL_COMMUNITY): Payer: Self-pay

## 2024-08-06 DIAGNOSIS — I10 Essential (primary) hypertension: Secondary | ICD-10-CM | POA: Diagnosis not present

## 2024-08-06 DIAGNOSIS — Z7902 Long term (current) use of antithrombotics/antiplatelets: Secondary | ICD-10-CM | POA: Insufficient documentation

## 2024-08-06 DIAGNOSIS — F4312 Post-traumatic stress disorder, chronic: Secondary | ICD-10-CM | POA: Diagnosis not present

## 2024-08-06 DIAGNOSIS — I672 Cerebral atherosclerosis: Secondary | ICD-10-CM | POA: Insufficient documentation

## 2024-08-06 DIAGNOSIS — R262 Difficulty in walking, not elsewhere classified: Secondary | ICD-10-CM | POA: Insufficient documentation

## 2024-08-06 DIAGNOSIS — Z96652 Presence of left artificial knee joint: Secondary | ICD-10-CM | POA: Insufficient documentation

## 2024-08-06 DIAGNOSIS — R531 Weakness: Secondary | ICD-10-CM | POA: Diagnosis present

## 2024-08-06 DIAGNOSIS — I700 Atherosclerosis of aorta: Secondary | ICD-10-CM | POA: Diagnosis not present

## 2024-08-06 DIAGNOSIS — R0602 Shortness of breath: Secondary | ICD-10-CM | POA: Diagnosis present

## 2024-08-06 DIAGNOSIS — R42 Dizziness and giddiness: Secondary | ICD-10-CM | POA: Diagnosis not present

## 2024-08-06 DIAGNOSIS — R002 Palpitations: Secondary | ICD-10-CM | POA: Insufficient documentation

## 2024-08-06 DIAGNOSIS — I252 Old myocardial infarction: Secondary | ICD-10-CM | POA: Diagnosis not present

## 2024-08-06 DIAGNOSIS — R079 Chest pain, unspecified: Secondary | ICD-10-CM | POA: Diagnosis present

## 2024-08-06 LAB — COMPREHENSIVE METABOLIC PANEL WITH GFR
ALT: 14 U/L (ref 0–44)
AST: 22 U/L (ref 15–41)
Albumin: 4.3 g/dL (ref 3.5–5.0)
Alkaline Phosphatase: 88 U/L (ref 38–126)
Anion gap: 14 (ref 5–15)
BUN: 17 mg/dL (ref 8–23)
CO2: 21 mmol/L — ABNORMAL LOW (ref 22–32)
Calcium: 9.6 mg/dL (ref 8.9–10.3)
Chloride: 104 mmol/L (ref 98–111)
Creatinine, Ser: 0.88 mg/dL (ref 0.44–1.00)
GFR, Estimated: >60 mL/min
Glucose, Bld: 120 mg/dL — ABNORMAL HIGH (ref 70–99)
Potassium: 3.5 mmol/L (ref 3.5–5.1)
Sodium: 139 mmol/L (ref 135–145)
Total Bilirubin: 0.5 mg/dL (ref 0.0–1.2)
Total Protein: 7.4 g/dL (ref 6.5–8.1)

## 2024-08-06 LAB — CBC WITH DIFFERENTIAL/PLATELET
Abs Immature Granulocytes: 0.01 K/uL (ref 0.00–0.07)
Basophils Absolute: 0 K/uL (ref 0.0–0.1)
Basophils Relative: 1 %
Eosinophils Absolute: 0.1 K/uL (ref 0.0–0.5)
Eosinophils Relative: 3 %
HCT: 38.7 % (ref 36.0–46.0)
Hemoglobin: 13.3 g/dL (ref 12.0–15.0)
Immature Granulocytes: 0 %
Lymphocytes Relative: 32 %
Lymphs Abs: 1.6 K/uL (ref 0.7–4.0)
MCH: 30.5 pg (ref 26.0–34.0)
MCHC: 34.4 g/dL (ref 30.0–36.0)
MCV: 88.8 fL (ref 80.0–100.0)
Monocytes Absolute: 0.3 K/uL (ref 0.1–1.0)
Monocytes Relative: 7 %
Neutro Abs: 2.9 K/uL (ref 1.7–7.7)
Neutrophils Relative %: 57 %
Platelets: 226 K/uL (ref 150–400)
RBC: 4.36 MIL/uL (ref 3.87–5.11)
RDW: 13.2 % (ref 11.5–15.5)
WBC: 5 K/uL (ref 4.0–10.5)
nRBC: 0 % (ref 0.0–0.2)

## 2024-08-06 LAB — PRO BRAIN NATRIURETIC PEPTIDE: Pro Brain Natriuretic Peptide: 66.5 pg/mL

## 2024-08-06 LAB — PROTIME-INR
INR: 1 (ref 0.8–1.2)
Prothrombin Time: 13.5 s (ref 11.4–15.2)

## 2024-08-06 LAB — TROPONIN T, HIGH SENSITIVITY
Troponin T High Sensitivity: <15 ng/L (ref 0–19)
Troponin T High Sensitivity: <15 ng/L (ref 0–19)

## 2024-08-06 MED ORDER — OXYCODONE-ACETAMINOPHEN 5-325 MG PO TABS
1.0000 | ORAL_TABLET | Freq: Once | ORAL | Status: DC
  Filled 2024-08-06 (×2): qty 1

## 2024-08-06 MED ORDER — IOHEXOL 350 MG/ML SOLN
75.0000 mL | Freq: Once | INTRAVENOUS | Status: AC | PRN
  Administered 2024-08-06: 75 mL via INTRAVENOUS

## 2024-08-06 MED ORDER — METOPROLOL TARTRATE 25 MG PO TABS: 25.0000 mg | ORAL_TABLET | Freq: Two times a day (BID) | ORAL | 2 refills | Status: AC

## 2024-08-06 MED ORDER — ACETAMINOPHEN 500 MG PO TABS
1000.0000 mg | ORAL_TABLET | Freq: Once | ORAL | Status: AC
  Administered 2024-08-06: 1000 mg via ORAL
  Filled 2024-08-06: qty 2

## 2024-08-06 MED ORDER — LORAZEPAM 2 MG/ML IJ SOLN
0.5000 mg | INTRAMUSCULAR | Status: AC
  Administered 2024-08-06: 0.5 mg via INTRAVENOUS
  Filled 2024-08-06: qty 1

## 2024-08-06 NOTE — ED Provider Notes (Signed)
 " [place] [facility] Provider Note   CSN: [id number] Arrival date & time: 08/06/24  1224     Patient presents with: Chest Pain and Weakness   Carrie Reynolds is a 69 y.o. female.  {Add pertinent medical, surgical, social history, OB history to HPI:7181} 69 year old female history of recent left knee replacement, MI status post PCI, and hypertension who presents to the emergency department with palpitations and dizziness.  Patient reports that over the past few days had 5 episodes where she will feel palpitations that feel like bubbles in her heart for several seconds and then will feel dizzy afterwards.  Says that she has had it happen in the past but not as frequently.  Thinks it may be related to discontinuing her propranolol  after she had left knee replacement in December.  Says that when she has taken it with the oxycodone  her blood pressures plummeted so has not been able to take both at the same time.  Checked her blood pressure and it was elevated today so she decided to come in.  Her primary doctor noted that it seemed like she was dragging her leg and wanted her to come to the emergency department for stroke evaluation.  Of note patient still is walking with a cane and is having significant pain with ambulation from her knee surgery       Prior to Admission medications  Medication Sig Start Date End Date Taking? Authorizing Provider  gabapentin  (NEURONTIN ) 100 MG capsule Take 1 capsule (100 mg total) by mouth 3 (three) times daily. 06/29/24   Addie Cordella Hamilton, MD  acetaminophen  (TYLENOL ) 500 MG tablet Take 1,000 mg by mouth every 6 (six) hours as needed for mild pain, moderate pain or headache.    [provider]  clopidogrel  (PLAVIX ) 75 MG tablet TAKE 1 TABLET BY MOUTH EVERY DAY 11/29/20   Anner Alm ORN, MD  docusate sodium  (COLACE) 100 MG capsule Take 1 capsule (100 mg total) by mouth 2 (two) times daily. 06/24/24   Addie Cordella Hamilton, MD  losartan -hydrochlorothiazide  (HYZAAR) 100-25 MG tablet Take 1 tablet by mouth daily. 04/01/24   Lucien Orren SAILOR, PA-C  methocarbamol  (ROBAXIN ) 500 MG tablet Take 1 tablet (500 mg total) by mouth every 6 (six) hours as needed for muscle spasms. 06/24/24   Addie Cordella Hamilton, MD  nitroGLYCERIN  (NITROSTAT ) 0.4 MG SL tablet Place 1 tablet (0.4 mg total) under the tongue every 5 (five) minutes as needed for chest pain. 12/18/23   Meng, Hao, PA  oxyCODONE -acetaminophen  (PERCOCET/ROXICET) 5-325 MG tablet Take 1 tablet by mouth every 6 (six) hours as needed for severe pain (pain score 7-10). 07/27/24   Magnant, Carlin CROME, PA-C  pantoprazole  (PROTONIX ) 40 MG tablet Take 40 mg by mouth daily before breakfast. 02/03/24   [provider]  progesterone (PROMETRIUM) 100 MG capsule Take 100 mg by mouth at bedtime.    [provider]  propranolol  (INDERAL ) 10 MG tablet May take  1 to 2  tablets a day  as needed for palpiptation 08/21/21   Cindie Delon POUR, PA-C  rosuvastatin  (CRESTOR ) 40 MG tablet Take 1 tablet (40 mg total) by mouth daily. 04/01/24   Lucien Orren SAILOR, PA-C  sertraline  (ZOLOFT ) 100 MG tablet Take 50 mg by mouth daily.    [provider]  sucralfate  (CARAFATE ) 1 g tablet TAKE 1 TABLET (1 G TOTAL) BY MOUTH 4 TIMES A DAY WITH MEALS AND AT BEDTIME Patient taking differently: Take  1 g by mouth 2 (two) times daily. 09/26/22   Mansouraty, Gabriel Jr., MD  terbinafine (LAMISIL) 250 MG tablet Take 250 mg by mouth daily. 05/14/24   [provider]  traZODone  (DESYREL ) 50 MG tablet Take 50 mg by mouth at bedtime as needed for sleep. 02/05/24   [provider]    Allergies: Ceclor [cefaclor], Tetracyclines & related, Aspirin , Lipitor [atorvastatin calcium ], Penicillins, Amoxicillin, Ampicillin, Lisinopril, and Simvastatin    Review of Systems  Updated Vital Signs BP (!) 152/81   Pulse 65   Temp 98.5 F (36.9 C)   Resp 18   Ht 5' 7 (1.702 m)   Wt  91.6 kg   LMP 07/24/2003   SpO2 98%   BMI 31.64 kg/m   Physical Exam Vitals and nursing note reviewed.  Constitutional:      General: She is not in acute distress.    Appearance: She is well-developed.  HENT:     Head: Normocephalic and atraumatic.     Right Ear: External ear normal.     Left Ear: External ear normal.     Nose: Nose normal.  Eyes:     Extraocular Movements: Extraocular movements intact.     Conjunctiva/sclera: Conjunctivae normal.     Pupils: Pupils are equal, round, and reactive to light.  Cardiovascular:     Rate and Rhythm: Normal rate and regular rhythm.     Heart sounds: No murmur heard.    Comments: Radial pulses 2+ bilaterally Pulmonary:     Effort: Pulmonary effort is normal. No respiratory distress.     Breath sounds: Normal breath sounds.  Musculoskeletal:     Cervical back: Normal range of motion and neck supple.     Right lower leg: No edema.     Left lower leg: No edema.     Comments: Left knee surgical site appears clean dry and intact.  Small effusion noted.  No erythema or warmth.  Skin:    General: Skin is warm and dry.  Neurological:     Mental Status: She is alert and oriented to person, place, and time. Mental status is at baseline.     Comments: NIHSS Exam  Level of Consciousness: Alert  LOC Questions: Answers Month and Age Correctly  LOC Commands: Opens and Closes Eyes and Hands on command  Best Gaze: Horizontal ocular movements intact  Visual Fields: No visual field loss  Facial Palsy: None  L Upper Extremity Motor: No drift after 10 seconds  R Upper Extremity Motor: No drift after 10 seconds  L Lower extremity Motor: No drift after 5 seconds  R Lower extremity Motor: No drift after 5 seconds  Ataxia: Absent  Sensory: Intact sensation to light touch on face, arms, trunk, and legs bilaterally  Best Language: No aphasia  Dysarthria: No dysarthria  Neglect: No visual or sensory neglect     Psychiatric:        Mood and Affect:  Mood normal.     (all labs ordered are listed, but only abnormal results are displayed) Labs Reviewed  COMPREHENSIVE METABOLIC PANEL WITH GFR - Abnormal; Notable for the following components:      Result Value   CO2 21 (*)    Glucose, Bld 120 (*)    All other components within normal limits  CBC WITH DIFFERENTIAL/PLATELET  PRO BRAIN NATRIURETIC PEPTIDE  PROTIME-INR  TROPONIN T, HIGH SENSITIVITY  TROPONIN T, HIGH SENSITIVITY    EKG: EKG Interpretation Date/Time:  Thursday August 06 2024 12:34:28  EST Ventricular Rate:  71 PR Interval:  154 QRS Duration:  82 QT Interval:  424 QTC Calculation: 460 R Axis:   26  Text Interpretation: Normal sinus rhythm Cannot rule out Anterior infarct , age undetermined Abnormal ECG When compared with ECG of 29-Aug-2023 10:25, PREVIOUS ECG IS PRESENT Confirmed by Yolande Charleston [phone]) on 08/06/2024 5:18:48 PM  Radiology: CT ANGIO HEAD NECK W WO CM Result Date: 08/06/2024 EXAM: CT HEAD WITHOUT CTA HEAD AND NECK WITH AND WITHOUT 08/06/2024 03:31:10 PM TECHNIQUE: CTA of the head and neck was performed with and without the administration of 75 mL of intravenous iohexol  (OMNIPAQUE ) 350 MG/ML injection. Noncontrast CT of the head with reconstructed 2-D images are also provided for review. Multiplanar 2D and/or 3D reformatted images are provided for review. Automated exposure control, iterative reconstruction, and/or weight based adjustment of the mA/kV was utilized to reduce the radiation dose to as low as reasonably achievable. COMPARISON: CT head 07/15/2021. MRI head 02/16/2014. CLINICAL HISTORY: Vertigo, central; right side weakness 2-3 days. FINDINGS: CT HEAD: BRAIN AND VENTRICLES: There is no evidence of an acute infarct, intracranial hemorrhage, mass, midline shift, hydrocephalus, or extra-axial fluid collection. An enlarged, empty sella is again noted. Cerebral volume is within normal limits for age. ORBITS: No acute abnormality. SINUSES AND MASTOIDS:  No acute abnormality. CTA NECK: AORTIC ARCH AND ARCH VESSELS: No dissection or arterial injury. No significant stenosis of the brachiocephalic or subclavian arteries. CERVICAL CAROTID ARTERIES: Small amount of calcified plaque in the left greater than right carotid bulbs. Tortuous distal cervical ICAs. No dissection, arterial injury, or hemodynamically significant stenosis by NASCET criteria. CERVICAL VERTEBRAL ARTERIES: Codominant vertebral arteries. Calcified plaque at the left vertebral artery origin and in the distal left V1 segment without evidence of a significant stenosis. No dissection or arterial injury. LUNGS AND MEDIASTINUM: Unremarkable. SOFT TISSUES: Multiple thyroid  nodules measuring up to approximately 1.2 cm in diameter with no follow up imaging required. BONES: Moderate cervical spondylosis. CTA HEAD: ANTERIOR CIRCULATION: The intracranial internal carotid arteries are patent with mild atherosclerotic calcification bilaterally not resulting in a significant stenosis. ACAs and MCAs are patent without evidence of a significant proximal stenosis. No aneurysm. POSTERIOR CIRCULATION: The intracranial vertebral arteries are widely patent to the basilar. Patent PICA and SCA origins are visualized bilaterally. The basilar artery is widely patent. There are small left and diminutive or absent right posterior communicating arteries. Both PCAs are patent without evidence of a significant proximal stenosis. No aneurysm. OTHER: No dural venous sinus thrombosis on this non-dedicated study. IMPRESSION: 1. No acute intracranial abnormality. 2. Mild atherosclerosis without a large vessel occlusion or hemodynamically significant stenosis in the head or neck. Electronically signed by: Dasie Hamburg MD 08/06/2024 03:46 PM EST RP Workstation: [id number]   CT Angio Chest PE W and/or Wo Contrast Result Date: 08/06/2024 CLINICAL DATA:  Concern for pulmonary embolism. EXAM: CT ANGIOGRAPHY CHEST WITH CONTRAST TECHNIQUE:  Multidetector CT imaging of the chest was performed using the standard protocol during bolus administration of intravenous contrast. Multiplanar CT image reconstructions and MIPs were obtained to evaluate the vascular anatomy. RADIATION DOSE REDUCTION: This exam was performed according to the departmental dose-optimization program which includes automated exposure control, adjustment of the mA and/or kV according to patient size and/or use of iterative reconstruction technique. CONTRAST:  75mL OMNIPAQUE  IOHEXOL  350 MG/ML SOLN COMPARISON:  Chest CT dated 04/17/2023. FINDINGS: Cardiovascular: There is no cardiomegaly or pericardial effusion. There is coronary vascular calcification. Mild atherosclerotic calcification of the thoracic aorta. No aneurysm  dilatation or dissection. The origins of the great vessels of the aortic arch are patent. No pulmonary artery embolus identified. Mediastinum/Nodes: No hilar or mediastinal adenopathy. The esophagus is grossly unremarkable. No mediastinal fluid collection. Heterogeneous right thyroid  hypodense nodule measures 17 mm. Recommend thyroid  US  (ref: J Am Coll Radiol. 2015 Feb;12(2): 143-50). Lungs/Pleura: No focal consolidation, pleural effusion, pneumothorax. The central airways are patent. 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 Musculoskeletal: No acute osseous pathology. Review of the MIP images confirms the above findings. IMPRESSION: 1. No acute intrathoracic pathology. No CT evidence of pulmonary artery embolus. 2.  Aortic Atherosclerosis (ICD10-I70.0). Electronically Signed   By: Vanetta Chou M.D.   On: 08/06/2024 15:37    {Document cardiac monitor, telemetry assessment procedure when appropriate:32947} Procedures   Medications Ordered in the ED  acetaminophen  (TYLENOL ) tablet 1,000 mg (1,000 mg Oral Given 08/06/24 1401)  iohexol  (OMNIPAQUE ) 350 MG/ML injection 75 mL (75 mLs Intravenous Contrast Given 08/06/24 1531)       {Click here for ABCD2, HEART and other calculators REFRESH Note before signing:1}                              Medical Decision Making Amount and/or Complexity of Data Reviewed Radiology: ordered.  Risk Prescription drug management.   ***  {Document critical care time when appropriate  Document review of labs and clinical decision tools ie CHADS2VASC2, etc  Document your independent review of radiology images and any outside records  Document your discussion with family members, caretakers and with consultants  Document social determinants of health affecting pt's care  Document your decision making why or why not admission, treatments were needed:32947:::1}   Final diagnoses:  None    ED Discharge Orders     None        "

## 2024-08-06 NOTE — ED Provider Triage Note (Signed)
 Emergency Medicine Provider Triage Evaluation Note  Carrie Reynolds , a 69 y.o. female  was evaluated in triage.  Pt complains of chest discomfort and weakness for 3 days.  Notes symptoms intermittent for several weeks but worse over the past few days.  Seen by PCP this morning and sent to ED for further evaluation of chest pain and right-sided weakness.  Notes with these episodes, she becomes very disoriented and feels drunk.  Also notes shortness of breath.  She is on Plavix .  Review of Systems  Positive: Chest pain, shortness of breath, nausea, dizziness Negative: Headache, syncope abdominal pain, vomiting/diarrhea  Physical Exam  BP (!) 161/82 (BP Location: Left Arm)   Pulse 76   Temp 98.5 F (36.9 C)   Resp 20   Ht 5' 7 (1.702 m)   Wt 91.6 kg   LMP 07/24/2003   SpO2 99%   BMI 31.64 kg/m  Gen:   Awake, no distress   Resp:  Normal effort  MSK:   Moves extremities without difficulty  Other:  No acute neurologic deficits  Medical Decision Making  Medically screening exam initiated at 1:10 PM.  Appropriate orders placed.  Berry Gallacher was informed that the remainder of the evaluation will be completed by another provider, this initial triage assessment does not replace that evaluation, and the importance of remaining in the ED until their evaluation is complete.     Neysa Thersia RAMAN, PA-C 08/06/24 1311

## 2024-08-06 NOTE — Telephone Encounter (Signed)
 Spoke with Carrie Reynolds.  She is on the way to the ER with her heart.  She'ss see us  Tuesday at 2:30.

## 2024-08-06 NOTE — ED Triage Notes (Signed)
 C/O palpations for 2-3 days/ weakness/SHOB. Right sided jawline pain today. C/O nausea. No vomiting.

## 2024-08-06 NOTE — ED Notes (Signed)
 Patient transported to MRI

## 2024-08-06 NOTE — Discharge Instructions (Signed)
 You were seen for your palpitations in the emergency department.   At home, please stop the propranolol  and start taking the metoprolol .  Please note your blood pressure may be slightly higher on this medication than it was on the propranolol .  Follow-up with your primary doctor in 2-3 days regarding your visit talk to them about your blood pressure and if your metoprolol  needs to be increased.  Cardiology will be calling you regarding an appointment within the next 72 hours.  You may contact them if you do not hear from them in that time using the information in this packet.  Please talk to them to see if you need an outpatient (Holter) monitor.  Return immediately to the emergency department if you experience any of the following: Chest pain, shortness of breath, fainting, or any other concerning symptoms.    Thank you for visiting our Emergency Department. It was a pleasure taking care of you today.

## 2024-08-06 NOTE — ED Triage Notes (Signed)
 Patient comes from dr's office for R side weakness, jaw cramping, chest pain, and palpitations x 2 days. PCP concerned for stroke and heart attack. Patient is not taking rate control meds d/t taking pain meds for knee surgery.

## 2024-08-06 NOTE — ED Notes (Signed)
 Went to collect patient from waiting room for second troponin blood draw. Called patient's name loudly x2, no response. KIT

## 2024-08-09 NOTE — Discharge Summary (Signed)
 Physician Discharge Summary      Patient ID: Carrie Reynolds MRN: [id number] DOB/AGE: 09-10-1955 69 y.o.  Admit date: 06/23/2024 Discharge date: 06/24/2024  Admission Diagnoses:  Principal Problem:   S/P total knee replacement, left Active Problems:   Arthritis of left knee   Discharge Diagnoses:  Same  Surgeries: Procedures: ARTHROPLASTY, KNEE, TOTAL INJECTION, KNEE on 06/23/2024   Consultants:   Discharged Condition: Stable  Hospital Course: Carrie Reynolds is an 69 y.o. female who was admitted 06/23/2024 with a chief complaint of left knee pain, and found to have a diagnosis of left knee arthritis.  They were brought to the operating room on 06/23/2024 and underwent the above named procedures.  Pt awoke from anesthesia without complication and was transferred to the floor. On POD1, patient's pain was overall controlled.  Able to mobilize well with PT.  No red flag signs or symptoms.  Discharged home on POD 1..  Pt will f/u with Dr. Addie in clinic in ~2 weeks.   Antibiotics given:  Anti-infectives (From admission, onward)    Start     Dose/Rate Route Frequency Ordered Stop   06/23/24 2200  vancomycin  (VANCOCIN ) IVPB 1000 mg/200 mL premix        1,000 mg 200 mL/hr over 60 Minutes Intravenous Every 12 hours 06/23/24 1652 06/23/24 2357   06/23/24 1229  vancomycin  (VANCOCIN ) powder  Status:  Discontinued          As needed 06/23/24 1229 06/23/24 1413   06/23/24 0845  levofloxacin  (LEVAQUIN ) IVPB 500 mg        500 mg 100 mL/hr over 60 Minutes Intravenous On call to O.R. 06/23/24 0831 06/23/24 1250   06/23/24 0845  vancomycin  (VANCOCIN ) IVPB 1000 mg/200 mL premix        1,000 mg 200 mL/hr over 60 Minutes Intravenous On call to O.R. 06/23/24 0831 06/23/24 1652     .  Recent vital signs:  Vitals:   06/24/24 1050 06/24/24 1355  BP: 117/63 122/60  Pulse:  68  Resp:  16  Temp:  98.5 F (36.9 C)  SpO2:  97%    Recent laboratory studies:  Results for orders placed  or performed during the hospital encounter of 06/23/24  Surgical pcr screen   Collection Time: 06/23/24  8:37 AM   Specimen: Nasal Mucosa; Nasal Swab  Result Value Ref Range   MRSA, PCR NEGATIVE NEGATIVE   Staphylococcus aureus NEGATIVE NEGATIVE  Basic metabolic panel per protocol   Collection Time: 06/23/24  9:05 AM  Result Value Ref Range   Sodium 137 135 - 145 mmol/L   Potassium 3.0 (L) 3.5 - 5.1 mmol/L   Chloride 106 98 - 111 mmol/L   CO2 22 22 - 32 mmol/L   Glucose, Bld 197 (H) 70 - 99 mg/dL   BUN 14 8 - 23 mg/dL   Creatinine, Ser 9.06 0.44 - 1.00 mg/dL   Calcium  9.1 8.9 - 10.3 mg/dL   GFR, Estimated >39 >39 mL/min   Anion gap 9 5 - 15  CBC per protocol   Collection Time: 06/23/24  9:05 AM  Result Value Ref Range   WBC 5.2 4.0 - 10.5 K/uL   RBC 4.35 3.87 - 5.11 MIL/uL   Hemoglobin 13.3 12.0 - 15.0 g/dL   HCT 60.7 63.9 - 53.9 %   MCV 90.1 80.0 - 100.0 fL   MCH 30.6 26.0 - 34.0 pg   MCHC 33.9 30.0 - 36.0 g/dL   RDW 86.4 88.4 -  15.5 %   Platelets 152 150 - 400 K/uL   nRBC 0.0 0.0 - 0.2 %    Discharge Medications:   Allergies as of 06/24/2024       Reactions   Ceclor [cefaclor] Hives, Rash   Tetracyclines & Related Hives   Aspirin  Other (See Comments)   Patient reports she does not take -hx of stomach ulcer 06/2021   Lipitor [atorvastatin Calcium ] Cough   Penicillins Swelling, Other (See Comments)   Has patient had a PCN reaction causing immediate rash, facial/tongue/throat swelling, SOB or lightheadedness with hypotension: No Has patient had a PCN reaction causing severe rash involving mucus membranes or skin necrosis: No Has patient had a PCN reaction that required hospitalization: Yes Has patient had a PCN reaction occurring within the last 10 years: No If all of the above answers are NO, then may proceed with Cephalosporin use.   Amoxicillin Rash   Ampicillin Rash   Lisinopril Cough   Simvastatin Cough        Medication List     STOP taking these  medications    cetirizine 10 MG tablet Commonly known as: ZYRTEC   famotidine  40 MG tablet Commonly known as: Pepcid    ferrous sulfate  325 (65 FE) MG tablet   isosorbide  mononitrate 60 MG 24 hr tablet Commonly known as: IMDUR    lidocaine  5 % Commonly known as: LIDODERM    omeprazole  40 MG capsule Commonly known as: PRILOSEC       TAKE these medications    acetaminophen  500 MG tablet Commonly known as: TYLENOL  Take 1,000 mg by mouth every 6 (six) hours as needed for mild pain, moderate pain or headache.   clopidogrel  75 MG tablet Commonly known as: PLAVIX  TAKE 1 TABLET BY MOUTH EVERY DAY   docusate sodium  100 MG capsule Commonly known as: COLACE Take 1 capsule (100 mg total) by mouth 2 (two) times daily.   losartan -hydrochlorothiazide  100-25 MG tablet Commonly known as: Hyzaar Take 1 tablet by mouth daily.   methocarbamol  500 MG tablet Commonly known as: ROBAXIN  Take 1 tablet (500 mg total) by mouth every 6 (six) hours as needed for muscle spasms.   nitroGLYCERIN  0.4 MG SL tablet Commonly known as: NITROSTAT  Place 1 tablet (0.4 mg total) under the tongue every 5 (five) minutes as needed for chest pain.   pantoprazole  40 MG tablet Commonly known as: PROTONIX  Take 40 mg by mouth daily before breakfast.   progesterone 100 MG capsule Commonly known as: PROMETRIUM Take 100 mg by mouth at bedtime.   rosuvastatin  40 MG tablet Commonly known as: CRESTOR  Take 1 tablet (40 mg total) by mouth daily.   sertraline  100 MG tablet Commonly known as: ZOLOFT  Take 50 mg by mouth daily.   sucralfate  1 g tablet Commonly known as: CARAFATE  TAKE 1 TABLET (1 G TOTAL) BY MOUTH 4 TIMES A DAY WITH MEALS AND AT BEDTIME What changed: See the new instructions.   terbinafine 250 MG tablet Commonly known as: LAMISIL Take 250 mg by mouth daily.   traZODone  50 MG tablet Commonly known as: DESYREL  Take 50 mg by mouth at bedtime as needed for sleep.        Diagnostic  Studies: MR BRAIN WO CONTRAST Result Date: 08/06/2024 CLINICAL DATA:  Initial evaluation for acute difficulty with ambulation. EXAM: MRI HEAD WITHOUT CONTRAST TECHNIQUE: Multiplanar, multiecho pulse sequences of the brain and surrounding structures were obtained without intravenous contrast. COMPARISON:  CT from earlier the same day. FINDINGS: Brain: Cerebral volume within normal  limits. Patchy T2/FLAIR hyperintensity involving the supratentorial cerebral white matter, consistent chronic small vessel ischemic disease, mild for age. No evidence for acute or subacute infarct. Or is a chronic cortical infarction. No acute or chronic intracranial blood products. No mass lesion, midline shift or mass effect. No hydrocephalus or extra-axial fluid collection. Empty sella noted. Vascular: Major intracranial vascular flow voids are maintained. Skull and upper cervical spine: Craniocervical junction within normal limits. Bone marrow signal intensity normal. No scalp soft tissue abnormality. Sinuses/Orbits: Globes orbital soft tissues within normal limits. Paranasal sinuses are largely clear. No significant mastoid effusion. Other: None. IMPRESSION: 1. No acute intracranial abnormality. 2. Mild chronic microvascular ischemic disease for age. Electronically Signed   By: Morene Hoard M.D.   On: 08/06/2024 19:19   CT ANGIO HEAD NECK W WO CM Result Date: 08/06/2024 EXAM: CT HEAD WITHOUT CTA HEAD AND NECK WITH AND WITHOUT 08/06/2024 03:31:10 PM TECHNIQUE: CTA of the head and neck was performed with and without the administration of 75 mL of intravenous iohexol  (OMNIPAQUE ) 350 MG/ML injection. Noncontrast CT of the head with reconstructed 2-D images are also provided for review. Multiplanar 2D and/or 3D reformatted images are provided for review. Automated exposure control, iterative reconstruction, and/or weight based adjustment of the mA/kV was utilized to reduce the radiation dose to as low as reasonably achievable.  COMPARISON: CT head 07/15/2021. MRI head 02/16/2014. CLINICAL HISTORY: Vertigo, central; right side weakness 2-3 days. FINDINGS: CT HEAD: BRAIN AND VENTRICLES: There is no evidence of an acute infarct, intracranial hemorrhage, mass, midline shift, hydrocephalus, or extra-axial fluid collection. An enlarged, empty sella is again noted. Cerebral volume is within normal limits for age. ORBITS: No acute abnormality. SINUSES AND MASTOIDS: No acute abnormality. CTA NECK: AORTIC ARCH AND ARCH VESSELS: No dissection or arterial injury. No significant stenosis of the brachiocephalic or subclavian arteries. CERVICAL CAROTID ARTERIES: Small amount of calcified plaque in the left greater than right carotid bulbs. Tortuous distal cervical ICAs. No dissection, arterial injury, or hemodynamically significant stenosis by NASCET criteria. CERVICAL VERTEBRAL ARTERIES: Codominant vertebral arteries. Calcified plaque at the left vertebral artery origin and in the distal left V1 segment without evidence of a significant stenosis. No dissection or arterial injury. LUNGS AND MEDIASTINUM: Unremarkable. SOFT TISSUES: Multiple thyroid  nodules measuring up to approximately 1.2 cm in diameter with no follow up imaging required. BONES: Moderate cervical spondylosis. CTA HEAD: ANTERIOR CIRCULATION: The intracranial internal carotid arteries are patent with mild atherosclerotic calcification bilaterally not resulting in a significant stenosis. ACAs and MCAs are patent without evidence of a significant proximal stenosis. No aneurysm. POSTERIOR CIRCULATION: The intracranial vertebral arteries are widely patent to the basilar. Patent PICA and SCA origins are visualized bilaterally. The basilar artery is widely patent. There are small left and diminutive or absent right posterior communicating arteries. Both PCAs are patent without evidence of a significant proximal stenosis. No aneurysm. OTHER: No dural venous sinus thrombosis on this non-dedicated  study. IMPRESSION: 1. No acute intracranial abnormality. 2. Mild atherosclerosis without a large vessel occlusion or hemodynamically significant stenosis in the head or neck. Electronically signed by: Dasie Hamburg MD 08/06/2024 03:46 PM EST RP Workstation: [id number]   CT Angio Chest PE W and/or Wo Contrast Result Date: 08/06/2024 CLINICAL DATA:  Concern for pulmonary embolism. EXAM: CT ANGIOGRAPHY CHEST WITH CONTRAST TECHNIQUE: Multidetector CT imaging of the chest was performed using the standard protocol during bolus administration of intravenous contrast. Multiplanar CT image reconstructions and MIPs were obtained to evaluate the vascular anatomy. RADIATION  DOSE REDUCTION: This exam was performed according to the departmental dose-optimization program which includes automated exposure control, adjustment of the mA and/or kV according to patient size and/or use of iterative reconstruction technique. CONTRAST:  75mL OMNIPAQUE  IOHEXOL  350 MG/ML SOLN COMPARISON:  Chest CT dated 04/17/2023. FINDINGS: Cardiovascular: There is no cardiomegaly or pericardial effusion. There is coronary vascular calcification. Mild atherosclerotic calcification of the thoracic aorta. No aneurysm dilatation or dissection. The origins of the great vessels of the aortic arch are patent. No pulmonary artery embolus identified. Mediastinum/Nodes: No hilar or mediastinal adenopathy. The esophagus is grossly unremarkable. No mediastinal fluid collection. Heterogeneous right thyroid  hypodense nodule measures 17 mm. Recommend thyroid  US  (ref: J Am Coll Radiol. 2015 Feb;12(2): 143-50). Lungs/Pleura: No focal consolidation, pleural effusion, pneumothorax. The central airways are patent. 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 Musculoskeletal: No acute osseous pathology. Review of the MIP images confirms the above findings. IMPRESSION: 1. No acute intrathoracic pathology. No CT evidence of pulmonary artery  embolus. 2.  Aortic Atherosclerosis (ICD10-I70.0). Electronically Signed   By: Vanetta Chou M.D.   On: 08/06/2024 15:37    Disposition: Discharge disposition: 01-Home or Self Care       Discharge Instructions     Call MD / Call 911   Complete by: As directed    If you experience chest pain or shortness of breath, CALL 911 and be transported to the hospital emergency room.  If you develope a fever above 101 F, pus (white drainage) or increased drainage or redness at the wound, or calf pain, call your surgeon's office.   Constipation Prevention   Complete by: As directed    Drink plenty of fluids.  Prune juice may be helpful.  You may use a stool softener, such as Colace (over the counter) 100 mg twice a day.  Use MiraLax (over the counter) for constipation as needed.   Diet - low sodium heart healthy   Complete by: As directed    Discharge instructions   Complete by: As directed    1. Start plavix  Thursday night 2. CPM 1 hour 3 times a day - increase daily 3. Ok to shower dressings waterproof   Increase activity slowly as tolerated   Complete by: As directed    Post-operative opioid taper instructions:   Complete by: As directed    POST-OPERATIVE OPIOID TAPER INSTRUCTIONS: It is important to wean off of your opioid medication as soon as possible. If you do not need pain medication after your surgery it is ok to stop day one. Opioids include: Codeine, Hydrocodone (Norco, Vicodin), Oxycodone (Percocet, oxycontin ) and hydromorphone  amongst others.  Long term and even short term use of opiods can cause: Increased pain response Dependence Constipation Depression Respiratory depression And more.  Withdrawal symptoms can include Flu like symptoms Nausea, vomiting And more Techniques to manage these symptoms Hydrate well Eat regular healthy meals Stay active Use relaxation techniques(deep breathing, meditating, yoga) Do Not substitute Alcohol to help with tapering If you  have been on opioids for less than two weeks and do not have pain than it is ok to stop all together.  Plan to wean off of opioids This plan should start within one week post op of your joint replacement. Maintain the same interval or time between taking each dose and first decrease the dose.  Cut the total daily intake of opioids by one tablet each day Next start to increase the time between doses. The last dose that should be eliminated is  the evening dose.           Contact information for after-discharge care     Home Medical Care     [facility] [place] [facility] .   Service: Home Health Services Contact information: [address] [place] [address] [place]  [address] [phone]                      Signed: Herlene Calix 08/09/2024, 5:58 PM

## 2024-08-10 NOTE — Therapy (Incomplete)
 " OUTPATIENT PHYSICAL THERAPY TREATMENT   Patient Name: Carrie Reynolds MRN: [id number] DOB:1955/11/26, 69 y.o., female Today's Date: 08/10/2024  END OF SESSION:     Past Medical History:  Diagnosis Date   Anxiety    Arnold-Chiari malformation (HCC) 2006   Arthritis    CAD S/P PCI 05/06/2019   05/06/2019: prox LAD (@ SPI) PCI with Resolute Onyx DES 3.5  x 8, Residual 60% PDA, Normal LVF; 07/02/2019: relook Cath for Anterior Ischemia on Mhoview --> CATH 70% pre-stent/proximal edge dissection --> proximal overlapping DES (Resolute Onyx 3.5 x 8 - new & old stent post-dilated to 3.8 mm)   Cervical strain    syndrome   Cervicogenic headache    Chronic kidney disease    Concussion with loss of consciousness 10/09/2017   CTS (carpal tunnel syndrome)    Depression    GERD (gastroesophageal reflux disease)    Glucose intolerance (impaired glucose tolerance)    High coronary artery calcium  score    Score of 1460.  Coronary CTA shows high-grade proximal-mid LAD stenosis (CT FFR 0.50).  Also PDA/PLA 60 to 70% stenosis (CTO FFR 0.75)   History of hiatal hernia    History of kidney stones    Hypertension    Migraine headache    with visual aura   Obesity    OSA (obstructive sleep apnea)    not treated   Postmenopausal    Sleep apnea    Vertigo    post concussive   Past Surgical History:  Procedure Laterality Date   BIOPSY  07/15/2021   Procedure: BIOPSY;  Surgeon: Wilhelmenia Aloha Raddle., MD;  Location: [facility] ENDOSCOPY;  Service: Gastroenterology;;   [name]     right great toe   CHOLECYSTECTOMY N/A 06/05/2018   Procedure: LAPAROSCOPIC CHOLECYSTECTOMY WITH POSSIBLE INTRAOPERATIVE CHOLANGIOGRAM;  Surgeon: Vanderbilt Ned, MD;  Location: [facility] OR;  Service: General;  Laterality: N/A;   CORONARY STENT INTERVENTION N/A 05/12/2019   Procedure: CORONARY STENT INTERVENTION;  Surgeon: Anner Alm ORN, MD;  Location: [facility] INVASIVE CV LAB;  Service: Cardiovascular;;; Culprit-p-mLAD 95%  950% SP1) --> Scoring Balloon PTCA -> DES PCI (Resolute DES 3.5 x 38 - 3.8 mm; 0% LAD& 55% SP1). -->  SP1 was somewhat jailed with TIMI II flow post PCI.   CORONARY STENT INTERVENTION N/A 07/02/2019   Procedure: CORONARY STENT INTERVENTION;  Surgeon: Anner Alm ORN, MD;  Location: [facility] INVASIVE CV LAB;  Service: Cardiovascular;; * 70% Proximal stent edge dissection (edge of previous stent that is patent) -->  successfully covered with additional RESOLUTE ONYX DES 3.5 mm x 8 mm overlapping proximal edge of previous stent (postdilated to 3.8 mm)   ENTEROSCOPY N/A 07/15/2021   Procedure: ENTEROSCOPY;  Surgeon: Wilhelmenia Aloha Raddle., MD;  Location: [facility] ENDOSCOPY;  Service: Gastroenterology;  Laterality: N/A;   GASTRIC BYPASS  11/2015   [facility]   GYNECOLOGIC CRYOSURGERY     HAND SURGERY     HIATAL HERNIA REPAIR  01/01/2024   in [place]    INJECTION KNEE Right 06/23/2024   Procedure: INJECTION, KNEE;  Surgeon: Addie Cordella Hamilton, MD;  Location: [facility] OR;  Service: Orthopedics;  Laterality: Right;   LEFT HEART CATH AND CORONARY ANGIOGRAPHY N/A 05/12/2019   Procedure: LEFT HEART CATH AND CORONARY ANGIOGRAPHY;  Surgeon: Anner Alm ORN, MD;  Location: [facility] INVASIVE CV LAB;  Service: Cardiovascular;;; Culprit-p-mLAD 95% 950% SP1) --> DES PCI.;  RPAV 60% -small caliber vessel, did not appear flow-limiting (medical management).  EF 55 to 60%.  Normal LVEDP.  LEFT HEART CATH AND CORONARY ANGIOGRAPHY N/A 07/02/2019   Procedure: LEFT HEART CATH AND CORONARY ANGIOGRAPHY;  Surgeon: Anner Alm ORN, MD;  Location: [facility] INVASIVE CV LAB;  Service: Cardiovascular;; CULPRIT = ~70% proximal edge dissection of previous stent (DES PCI overlapping stent placed) - jails SP1 w/ ~60-70% ostial stenosis.SABRA mLAD 25%. RI 30%, RPAV 60%.   LEFT HEART CATH AND CORONARY ANGIOGRAPHY N/A 03/30/2022   Procedure: LEFT HEART CATH AND CORONARY ANGIOGRAPHY;  Surgeon: Wendel Lurena POUR, MD;  Location: [facility] INVASIVE CV LAB;  Service:  Cardiovascular;  Laterality: N/A;   NM MYOVIEW  LTD  06/24/2019   (Lexiscan ): HIGH RISK.  EF 55%.  Large size, moderate severe defect in the anterior wall with associated anterior hypokinesis.  Suspect LAD territory.   NM MYOVIEW  LTD  10/23/2019   Normal EF 55 to 60%.  No EKG changes.  Small size mild severity fixed defect in the apical wall consistent with breast attenuation versus small prior infarct.  Marked improvement from anterior apical perfusion abnormality seen in February 2020.   TOTAL KNEE ARTHROPLASTY Left 06/23/2024   Procedure: ARTHROPLASTY, KNEE, TOTAL;  Surgeon: Addie Cordella Hamilton, MD;  Location: [facility] OR;  Service: Orthopedics;  Laterality: Left;   TRANSTHORACIC ECHOCARDIOGRAM  12/08/2020   Normal LV Fxn - EF 60-65%. No RWMA. Mod Asymmetric Basal Septa LVH with mild Concentric LVH. Gr II DD. Normal Longitudinal Strain. Normal RV size & fxn - normal RAP/CVP.  Normal MV. Mild AoV sclerosis w/ stenosis. Mild Ascending Ao dilation - ~38 mm (borderline).  => No change from 04/2017.   TUBAL LIGATION Bilateral    UPPER GASTROINTESTINAL ENDOSCOPY     VAGINAL HYSTERECTOMY  2003   with LSO and right salpingectomy; right ovary still present   [facility] MONITOR-14-day  01/2021   Predominant rhythm sinus-heart rate range 40 to 124 bpm.  Average 70 bpm.  Rare PVCs and isolated PACs noted.  PAC couplets and triplets also noted as well as trigeminy.  3 atrial runs of 4-5 beats ranging from 102 to 150 bpm.  (2 of 3 noted on patient trigger.  Remainder patient triggers were sinus rhythm with PACs or PVCs); no atrial fibrillation or other sustained arrhythmias noted.   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   Ulcer of small intestine 09/06/2021   Acute blood loss anemia 09/06/2021   Iron deficiency 09/06/2021   Colon cancer screening 09/06/2021   Long term (current) use of antithrombotics/antiplatelets 09/06/2021   Jejunal ulcer 08/21/2021   Syncope and collapse 07/15/2021   Sinus bradycardia 11/03/2019   Atypical angina 10/12/2019   Fatigue 10/12/2019   Presence of 2 overlapping Drug Coated Stents in LAD coronary artery    Coronary artery disease involving native coronary artery of native heart with angina pectoris 06/17/2019   DOE (dyspnea on exertion) 06/16/2019   CAD S/P DES PCI-proximal LAD 05/20/2019   Agatston coronary artery calcium  score greater than 400 04/16/2019   Migraine with vertigo 07/29/2018   Tinnitus of both ears 07/29/2018   Rapid palpitations 04/16/2017   Systolic murmur 04/16/2017   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   OSA on CPAP 11/19/2013  Hyperlipidemia with target LDL less than 70 07/29/2013   Obesity (BMI 30.0-34.9) 04/25/2012   Depression with anxiety 04/25/2012   Environmental allergies 04/25/2012   Essential (primary) hypertension 04/25/2012   Chiari malformation type I (HCC) 01/03/2012   Postmenopausal 09/18/2011    PCP: Loring Tanda Mae, MD   REFERRING PROVIDER:   Addie Cordella Hamilton, MD   *MD NOTE: PT 3x/wk for 6 wks for ROM and strengthening starting ASAP *  REFERRING DIAG: S03.347 (ICD-10-CM) - Status post total left knee replacement   THERAPY DIAG:  No diagnosis found.  Rationale for Evaluation and Treatment: Rehabilitation  ONSET DATE: 06/23/24 surgery  L TKA  SUBJECTIVE:   SUBJECTIVE STATEMENT: ***Pt reports a lot of difficulty last night with soreness and pain. Felt good initially after yesterday's session for an hour or so but states she tried to keep doing exercises throughout the day.    PERTINENT HISTORY: No previous L knee  surgeries  PAIN:  ***Are you having pain? Yes: NPRS scale: 2/10 Pain location: anterior, quad and laterally Pain description: achy, swelling, thick and tight  Aggravating factors: nighttime, walking, standing Relieving factors: meds, ice  PRECAUTIONS: None  RED FLAGS: None   WEIGHT BEARING RESTRICTIONS: No  FALLS:  Has patient fallen in last 6 months? 2 due to vertigo issues  LIVING ENVIRONMENT: Lives with: lives with their spouse Lives in: House/apartment Stairs: Yes: Internal: 12 steps; on left going up Has following equipment at home: Single point cane  OCCUPATION: retired  PLOF: Independent  PATIENT GOALS: increase mobility.  NEXT MD VISIT: 07/28/23  OBJECTIVE:  Note: Objective measures were completed at Evaluation unless otherwise noted.  DIAGNOSTIC FINDINGS: 10/14/23- AP lateral merchant radiographs left knee reviewed.  Moderate to severe  arthritis is present in the lateral compartment with spurring on both the  femoral and tibial side.  Alignment intact.  Mild arthritis present in the  medial and patellofemoral compartments.  No acute fracture.   PATIENT SURVEYS:  PSFS: THE PATIENT SPECIFIC FUNCTIONAL SCALE  Place score of 0-10 (0 = unable to perform activity and 10 = able to perform activity at the same level as before injury or problem)  Activity Date: 07/21/24    driving 0    2.standing>5 min 3    3.ccoking small meal 1    Total Score 1.33      Total Score = Sum of activity scores/number of activities  Minimally Detectable Change: 3 points (for single activity); 2 points (for average score)  [facility] (nd). The Patient Specific Functional Scale . Retrieved from [email or web address]   COGNITION: Overall cognitive status: Within functional limits for tasks assessed     SENSATION: WFL  EDEMA:  Not assessed  MUSCLE LENGTH: Hamstrings: Left mod restriction  POSTURE: No  Significant postural limitations  PALPATION: 1+ around knee jt line and incision  LOWER EXTREMITY ROM:  Active ROM Right eval Left eval Left 07/30/24 L 08/04/24  Knee flexion  82 Supine A: 80 AA: 87 AAROM 90*  Knee extension  4 from 0 supine A: -5 (seated LAQ) Supine AA: -1    (Blank rows = not tested)  LOWER EXTREMITY MMT:  MMT Right eval Left eval  Hip flexion  4+  Hip extension    Hip abduction  4+  Hip adduction    Hip internal rotation    Hip external rotation    Knee flexion  4  Knee extension  4-  Ankle dorsiflexion    Ankle plantarflexion  Ankle inversion    Ankle eversion     (Blank rows = not tested)  LOWER EXTREMITY SPECIAL TESTS:  N/A  FUNCTIONAL TESTS:  5 times sit to stand: 16 sec  GAIT: Distance walked: household Assistive device utilized: Single point cane Level of assistance: Complete Independence Comments: ---                                                                                                                                TREATMENT DATE:  08/11/24***       08/05/24 Sitting heel slide x 4 min with self assist  Sitting heel/toe raise 2x10 Sitting adductor stretch x 30 Sitting hamstring stretch 2x 30 Supine quad/flexor stretch with strap 2x30 Supine ITB stretch with strap 2x30 Supine quad set 2x10 Supine SAQ 2x10 Supine heel slides with strap 10x5  Nustep L5; 2 min each starting at seat 11 and then making way to seat 8  Vaso x 15 min R knee, medium compression, 34 deg, elevated on bolster  08/04/24 Nustep L2x8 minutes seat 9 for ROM, w/u   Knee flexion stretch AAROM 12x3 seconds LAQs 2# 12x3 second holds Bridges + progressive knee flexion 3x5  Supine SLRs 2# x12  Education on vertigo- role of PT in intervening but would need a formal order for us  to work on this here  Percussion gun middle and lateral quad  Knee flexion OP seated edge of mat table      07/31/24 TherAct NuStep L6 x 8 min; seat 10 working  on strength, endurance and ROM AA heel slides with strap and ball x10 reps Bridges x 10 reps; 5 sec hold Mini squats x 10 reps Standing marching x 10 reps bil with bil UE support Calf raises x 10 reps  TherEx ROM measurements - see above for details Long sitting quad sets x10 reps Supine SLR x10 reps    07/21/24 Initial evaluation of L knee completed followed by instruction and trial set of HEP.    PATIENT EDUCATION:  Education details: HEP Person educated: Patient Education method: Programmer, Multimedia, Facilities Manager, Actor cues, Verbal cues, and Handouts Education comprehension: verbalized understanding, returned demonstration, and verbal cues required  HOME EXERCISE PROGRAM: Access Code: [email or web address] URL: [email or web address] Date: 07/21/2024 Prepared by: Burnard Meth  Exercises - Supine Quad Set  - 2 x daily - 7 x weekly - 2 sets - 10 reps - 2 hold - Active Straight Leg Raise with Quad Set  - 2 x daily - 7 x weekly - 2 sets - 10 reps - Supine Heel Slide  - 2 x daily - 7 x weekly - 2 sets - 10 reps - Supine Bridge  - 1 x daily - 7 x weekly - 3 sets - 10 reps - Mini Squat with Counter Support  - 2 x daily - 7 x weekly - 2 sets - 10 reps - Standing March with Counter Support  - 2 x daily -  7 x weekly - 2 sets - 10 reps - Heel Raises with Counter Support  - 2 x daily - 7 x weekly - 2 sets - 10 reps  ASSESSMENT:  CLINICAL IMPRESSION: ***Treatment continues to focus on improving strength and ROM. Pt very sore from yesterday's session. Eased up on strengthening today and focused on active recovery and stretching.     OBJECTIVE IMPAIRMENTS: Abnormal gait, difficulty walking, decreased ROM, decreased strength, increased edema, impaired flexibility, and pain.   ACTIVITY LIMITATIONS: bending, sitting, standing, squatting, sleeping, stairs, and transfers  PARTICIPATION LIMITATIONS: meal prep, driving, and community activity  PERSONAL FACTORS: 1-2 comorbidities: HTN and  cardiac hx are also affecting patient's functional outcome.   REHAB POTENTIAL: Good  CLINICAL DECISION MAKING: Stable/uncomplicated  EVALUATION COMPLEXITY: Moderate   GOALS: Goals reviewed with patient? Yes  SHORT TERM GOALS: Target date: 08/11/2024 3 weeks    Pt to be independent with HEP. Baseline: Goal status: MET 07/31/24  2.  Decrease pain by 1 level. Baseline:  Goal status: INITIAL  3.  Improved knee flexion in swing phase gait. Baseline:  Goal status: INITIAL  LONG TERM GOALS: Target date: 09/29/2024  10 weeks    Pt to be independent with self progressive HEP at discharge. Baseline:  Goal status: INITIAL  2.  Increase AROM of L knee to at least 0>105 Baseline:  Goal status: INITIAL  3.  Increase strength to at least 4/5 of L LE to enable independence with ADLs and ambulation. Baseline:  Goal status: INITIAL  4.  Pt able to ambulate short community distances without use of AD. Baseline:  Goal status: INITIAL  5.  Increase score of PSFS by at least 3 points to demonstrate measurable improvement. Baseline:  Goal status: INITIAL  6.  Decrease max pain to 2/10 with all activities. Baseline:  Goal status: INITIAL   PLAN:  PT FREQUENCY: 3x/week  PT DURATION: 10 weeks  PLANNED INTERVENTIONS: 97164- PT Re-evaluation, 97750- Physical Performance Testing, 97110-Therapeutic exercises, 97530- Therapeutic activity, [id number]- Neuromuscular re-education, 97535- Self Care, [id number]- Manual therapy, [phone]- Gait training, [phone]- Aquatic Therapy, [phone]- Electrical stimulation (unattended), 97016- Vasopneumatic device, 20560 (1-2 muscles), 20561 (3+ muscles)- Dry Needling, Patient/Family education, Balance training, Stair training, Joint mobilization, Scar mobilization, Cryotherapy, and Moist heat  PLAN FOR NEXT SESSION: ***continue to maximize quad strength and flexion focus, work on balance and strengthening as able. Caution due to positional vertigo   Burnard CHRISTELLA Meth, PT,   08/10/24 7:57 AM  "

## 2024-08-11 ENCOUNTER — Encounter

## 2024-08-14 ENCOUNTER — Ambulatory Visit: Admitting: Surgical

## 2024-08-14 ENCOUNTER — Telehealth: Payer: Self-pay | Admitting: Physical Therapy

## 2024-08-14 ENCOUNTER — Encounter: Admitting: Physical Therapy

## 2024-08-14 NOTE — Telephone Encounter (Signed)
 LVM for pt as she did not show for PT today.  Reminded of no show policy and of next scheduled appt.  Asked to call or use MyChart if she needs to cancel.  Corean JULIANNA Ku, PT, DPT 08/14/24 11:23 AM

## 2024-08-14 NOTE — Therapy (Incomplete)
 " OUTPATIENT PHYSICAL THERAPY TREATMENT   Patient Name: Carrie Reynolds MRN: [id number] DOB:31-Aug-1955, 69 y.o., female Today's Date: 08/14/2024  END OF SESSION:     Past Medical History:  Diagnosis Date   Anxiety    Arnold-Chiari malformation (HCC) 2006   Arthritis    CAD S/P PCI 05/06/2019   05/06/2019: prox LAD (@ SPI) PCI with Resolute Onyx DES 3.5  x 8, Residual 60% PDA, Normal LVF; 07/02/2019: relook Cath for Anterior Ischemia on Mhoview --> CATH 70% pre-stent/proximal edge dissection --> proximal overlapping DES (Resolute Onyx 3.5 x 8 - new & old stent post-dilated to 3.8 mm)   Cervical strain    syndrome   Cervicogenic headache    Chronic kidney disease    Concussion with loss of consciousness 10/09/2017   CTS (carpal tunnel syndrome)    Depression    GERD (gastroesophageal reflux disease)    Glucose intolerance (impaired glucose tolerance)    High coronary artery calcium  score    Score of 1460.  Coronary CTA shows high-grade proximal-mid LAD stenosis (CT FFR 0.50).  Also PDA/PLA 60 to 70% stenosis (CTO FFR 0.75)   History of hiatal hernia    History of kidney stones    Hypertension    Migraine headache    with visual aura   Obesity    OSA (obstructive sleep apnea)    not treated   Postmenopausal    Sleep apnea    Vertigo    post concussive   Past Surgical History:  Procedure Laterality Date   BIOPSY  07/15/2021   Procedure: BIOPSY;  Surgeon: Wilhelmenia Aloha Raddle., MD;  Location: [facility] ENDOSCOPY;  Service: Gastroenterology;;   [name]     right great toe   CHOLECYSTECTOMY N/A 06/05/2018   Procedure: LAPAROSCOPIC CHOLECYSTECTOMY WITH POSSIBLE INTRAOPERATIVE CHOLANGIOGRAM;  Surgeon: Vanderbilt Ned, MD;  Location: [facility] OR;  Service: General;  Laterality: N/A;   CORONARY STENT INTERVENTION N/A 05/12/2019   Procedure: CORONARY STENT INTERVENTION;  Surgeon: Anner Alm ORN, MD;  Location: [facility] INVASIVE CV LAB;  Service: Cardiovascular;;; Culprit-p-mLAD 95%  950% SP1) --> Scoring Balloon PTCA -> DES PCI (Resolute DES 3.5 x 38 - 3.8 mm; 0% LAD& 55% SP1). -->  SP1 was somewhat jailed with TIMI II flow post PCI.   CORONARY STENT INTERVENTION N/A 07/02/2019   Procedure: CORONARY STENT INTERVENTION;  Surgeon: Anner Alm ORN, MD;  Location: [facility] INVASIVE CV LAB;  Service: Cardiovascular;; * 70% Proximal stent edge dissection (edge of previous stent that is patent) -->  successfully covered with additional RESOLUTE ONYX DES 3.5 mm x 8 mm overlapping proximal edge of previous stent (postdilated to 3.8 mm)   ENTEROSCOPY N/A 07/15/2021   Procedure: ENTEROSCOPY;  Surgeon: Wilhelmenia Aloha Raddle., MD;  Location: [facility] ENDOSCOPY;  Service: Gastroenterology;  Laterality: N/A;   GASTRIC BYPASS  11/2015   [facility]   GYNECOLOGIC CRYOSURGERY     HAND SURGERY     HIATAL HERNIA REPAIR  01/01/2024   in [place]    INJECTION KNEE Right 06/23/2024   Procedure: INJECTION, KNEE;  Surgeon: Addie Cordella Hamilton, MD;  Location: [facility] OR;  Service: Orthopedics;  Laterality: Right;   LEFT HEART CATH AND CORONARY ANGIOGRAPHY N/A 05/12/2019   Procedure: LEFT HEART CATH AND CORONARY ANGIOGRAPHY;  Surgeon: Anner Alm ORN, MD;  Location: [facility] INVASIVE CV LAB;  Service: Cardiovascular;;; Culprit-p-mLAD 95% 950% SP1) --> DES PCI.;  RPAV 60% -small caliber vessel, did not appear flow-limiting (medical management).  EF 55 to 60%.  Normal LVEDP.  LEFT HEART CATH AND CORONARY ANGIOGRAPHY N/A 07/02/2019   Procedure: LEFT HEART CATH AND CORONARY ANGIOGRAPHY;  Surgeon: Anner Alm ORN, MD;  Location: [facility] INVASIVE CV LAB;  Service: Cardiovascular;; CULPRIT = ~70% proximal edge dissection of previous stent (DES PCI overlapping stent placed) - jails SP1 w/ ~60-70% ostial stenosis.SABRA mLAD 25%. RI 30%, RPAV 60%.   LEFT HEART CATH AND CORONARY ANGIOGRAPHY N/A 03/30/2022   Procedure: LEFT HEART CATH AND CORONARY ANGIOGRAPHY;  Surgeon: Wendel Lurena POUR, MD;  Location: [facility] INVASIVE CV LAB;  Service:  Cardiovascular;  Laterality: N/A;   NM MYOVIEW  LTD  06/24/2019   (Lexiscan ): HIGH RISK.  EF 55%.  Large size, moderate severe defect in the anterior wall with associated anterior hypokinesis.  Suspect LAD territory.   NM MYOVIEW  LTD  10/23/2019   Normal EF 55 to 60%.  No EKG changes.  Small size mild severity fixed defect in the apical wall consistent with breast attenuation versus small prior infarct.  Marked improvement from anterior apical perfusion abnormality seen in February 2020.   TOTAL KNEE ARTHROPLASTY Left 06/23/2024   Procedure: ARTHROPLASTY, KNEE, TOTAL;  Surgeon: Addie Cordella Hamilton, MD;  Location: [facility] OR;  Service: Orthopedics;  Laterality: Left;   TRANSTHORACIC ECHOCARDIOGRAM  12/08/2020   Normal LV Fxn - EF 60-65%. No RWMA. Mod Asymmetric Basal Septa LVH with mild Concentric LVH. Gr II DD. Normal Longitudinal Strain. Normal RV size & fxn - normal RAP/CVP.  Normal MV. Mild AoV sclerosis w/ stenosis. Mild Ascending Ao dilation - ~38 mm (borderline).  => No change from 04/2017.   TUBAL LIGATION Bilateral    UPPER GASTROINTESTINAL ENDOSCOPY     VAGINAL HYSTERECTOMY  2003   with LSO and right salpingectomy; right ovary still present   [facility] MONITOR-14-day  01/2021   Predominant rhythm sinus-heart rate range 40 to 124 bpm.  Average 70 bpm.  Rare PVCs and isolated PACs noted.  PAC couplets and triplets also noted as well as trigeminy.  3 atrial runs of 4-5 beats ranging from 102 to 150 bpm.  (2 of 3 noted on patient trigger.  Remainder patient triggers were sinus rhythm with PACs or PVCs); no atrial fibrillation or other sustained arrhythmias noted.   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   Ulcer of small intestine 09/06/2021   Acute blood loss anemia 09/06/2021   Iron deficiency 09/06/2021   Colon cancer screening 09/06/2021   Long term (current) use of antithrombotics/antiplatelets 09/06/2021   Jejunal ulcer 08/21/2021   Syncope and collapse 07/15/2021   Sinus bradycardia 11/03/2019   Atypical angina 10/12/2019   Fatigue 10/12/2019   Presence of 2 overlapping Drug Coated Stents in LAD coronary artery    Coronary artery disease involving native coronary artery of native heart with angina pectoris 06/17/2019   DOE (dyspnea on exertion) 06/16/2019   CAD S/P DES PCI-proximal LAD 05/20/2019   Agatston coronary artery calcium  score greater than 400 04/16/2019   Migraine with vertigo 07/29/2018   Tinnitus of both ears 07/29/2018   Rapid palpitations 04/16/2017   Systolic murmur 04/16/2017   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   OSA on CPAP 11/19/2013  Hyperlipidemia with target LDL less than 70 07/29/2013   Obesity (BMI 30.0-34.9) 04/25/2012   Depression with anxiety 04/25/2012   Environmental allergies 04/25/2012   Essential (primary) hypertension 04/25/2012   Chiari malformation type I (HCC) 01/03/2012   Postmenopausal 09/18/2011    PCP: Loring Tanda Mae, MD   REFERRING PROVIDER:   Addie Cordella Hamilton, MD   *MD NOTE: PT 3x/wk for 6 wks for ROM and strengthening starting ASAP *  REFERRING DIAG: S03.347 (ICD-10-CM) - Status post total left knee replacement   THERAPY DIAG:  No diagnosis found.  Rationale for Evaluation and Treatment: Rehabilitation  ONSET DATE: 06/23/24 surgery  L TKA  SUBJECTIVE:   SUBJECTIVE STATEMENT: *** Pt reports a lot of difficulty last night with soreness and pain. Felt good initially after yesterday's session for an hour or so but states she tried to keep doing exercises throughout the day.    PERTINENT HISTORY: No previous L  knee surgeries  PAIN:  ***Are you having pain? Yes: NPRS scale: 2/10 Pain location: anterior, quad and laterally Pain description: achy, swelling, thick and tight  Aggravating factors: nighttime, walking, standing Relieving factors: meds, ice  PRECAUTIONS: None  RED FLAGS: None   WEIGHT BEARING RESTRICTIONS: No  FALLS:  Has patient fallen in last 6 months? 2 due to vertigo issues  LIVING ENVIRONMENT: Lives with: lives with their spouse Lives in: House/apartment Stairs: Yes: Internal: 12 steps; on left going up Has following equipment at home: Single point cane  OCCUPATION: retired  PLOF: Independent  PATIENT GOALS: increase mobility.  NEXT MD VISIT: 07/28/23  OBJECTIVE:  Note: Objective measures were completed at Evaluation unless otherwise noted.  DIAGNOSTIC FINDINGS: 10/14/23- AP lateral merchant radiographs left knee reviewed.  Moderate to severe  arthritis is present in the lateral compartment with spurring on both the  femoral and tibial side.  Alignment intact.  Mild arthritis present in the  medial and patellofemoral compartments.  No acute fracture.   PATIENT SURVEYS:  PSFS: THE PATIENT SPECIFIC FUNCTIONAL SCALE  Place score of 0-10 (0 = unable to perform activity and 10 = able to perform activity at the same level as before injury or problem)  Activity Date: 07/21/24    driving 0    2.standing>5 min 3    3.ccoking small meal 1    Total Score 1.33      Total Score = Sum of activity scores/number of activities  Minimally Detectable Change: 3 points (for single activity); 2 points (for average score)  [facility] (nd). The Patient Specific Functional Scale . Retrieved from [email or web address]   COGNITION: Overall cognitive status: Within functional limits for tasks assessed     SENSATION: WFL  EDEMA:  Not assessed  MUSCLE LENGTH: Hamstrings: Left mod restriction  POSTURE: No  Significant postural limitations  PALPATION: 1+ around knee jt line and incision  LOWER EXTREMITY ROM:  Active ROM Right eval Left eval Left 07/30/24 L 08/04/24  Knee flexion  82 Supine A: 80 AA: 87 AAROM 90*  Knee extension  4 from 0 supine A: -5 (seated LAQ) Supine AA: -1    (Blank rows = not tested)  LOWER EXTREMITY MMT:  MMT Right eval Left eval  Hip flexion  4+  Hip extension    Hip abduction  4+  Hip adduction    Hip internal rotation    Hip external rotation    Knee flexion  4  Knee extension  4-  Ankle dorsiflexion    Ankle plantarflexion  Ankle inversion    Ankle eversion     (Blank rows = not tested)  LOWER EXTREMITY SPECIAL TESTS:  N/A  FUNCTIONAL TESTS:  5 times sit to stand: 16 sec  GAIT: Distance walked: household Assistive device utilized: Single point cane Level of assistance: Complete Independence Comments: ---                                                                                                                                TREATMENT DATE:  08/14/24 ***       08/05/24 Sitting heel slide x 4 min with self assist  Sitting heel/toe raise 2x10 Sitting adductor stretch x 30 Sitting hamstring stretch 2x 30 Supine quad/flexor stretch with strap 2x30 Supine ITB stretch with strap 2x30 Supine quad set 2x10 Supine SAQ 2x10 Supine heel slides with strap 10x5  Nustep L5; 2 min each starting at seat 11 and then making way to seat 8  Vaso x 15 min R knee, medium compression, 34 deg, elevated on bolster  08/04/24 Nustep L2x8 minutes seat 9 for ROM, w/u   Knee flexion stretch AAROM 12x3 seconds LAQs 2# 12x3 second holds Bridges + progressive knee flexion 3x5  Supine SLRs 2# x12  Education on vertigo- role of PT in intervening but would need a formal order for us  to work on this here  Percussion gun middle and lateral quad  Knee flexion OP seated edge of mat table      07/31/24 TherAct NuStep L6 x 8 min; seat 10  working on strength, endurance and ROM AA heel slides with strap and ball x10 reps Bridges x 10 reps; 5 sec hold Mini squats x 10 reps Standing marching x 10 reps bil with bil UE support Calf raises x 10 reps  TherEx ROM measurements - see above for details Long sitting quad sets x10 reps Supine SLR x10 reps    07/21/24 Initial evaluation of L knee completed followed by instruction and trial set of HEP.    PATIENT EDUCATION:  Education details: HEP Person educated: Patient Education method: Programmer, Multimedia, Facilities Manager, Actor cues, Verbal cues, and Handouts Education comprehension: verbalized understanding, returned demonstration, and verbal cues required  HOME EXERCISE PROGRAM: Access Code: [email or web address] URL: [email or web address] Date: 07/21/2024 Prepared by: Burnard Meth  Exercises - Supine Quad Set  - 2 x daily - 7 x weekly - 2 sets - 10 reps - 2 hold - Active Straight Leg Raise with Quad Set  - 2 x daily - 7 x weekly - 2 sets - 10 reps - Supine Heel Slide  - 2 x daily - 7 x weekly - 2 sets - 10 reps - Supine Bridge  - 1 x daily - 7 x weekly - 3 sets - 10 reps - Mini Squat with Counter Support  - 2 x daily - 7 x weekly - 2 sets - 10 reps - Standing March with Counter Support  - 2 x daily -  7 x weekly - 2 sets - 10 reps - Heel Raises with Counter Support  - 2 x daily - 7 x weekly - 2 sets - 10 reps  ASSESSMENT:  CLINICAL IMPRESSION: ***Treatment continues to focus on improving strength and ROM. Pt very sore from yesterday's session. Eased up on strengthening today and focused on active recovery and stretching.     OBJECTIVE IMPAIRMENTS: Abnormal gait, difficulty walking, decreased ROM, decreased strength, increased edema, impaired flexibility, and pain.   ACTIVITY LIMITATIONS: bending, sitting, standing, squatting, sleeping, stairs, and transfers  PARTICIPATION LIMITATIONS: meal prep, driving, and community activity  PERSONAL FACTORS: 1-2 comorbidities:  HTN and cardiac hx are also affecting patient's functional outcome.   REHAB POTENTIAL: Good  CLINICAL DECISION MAKING: Stable/uncomplicated  EVALUATION COMPLEXITY: Moderate   GOALS: Goals reviewed with patient? Yes  SHORT TERM GOALS: Target date: 08/11/2024 3 weeks    Pt to be independent with HEP. Baseline: Goal status: MET 07/31/24  2.  Decrease pain by 1 level. Baseline:  Goal status: INITIAL  3.  Improved knee flexion in swing phase gait. Baseline:  Goal status: INITIAL  LONG TERM GOALS: Target date: 09/29/2024  10 weeks    Pt to be independent with self progressive HEP at discharge. Baseline:  Goal status: INITIAL  2.  Increase AROM of L knee to at least 0>105 Baseline:  Goal status: INITIAL  3.  Increase strength to at least 4/5 of L LE to enable independence with ADLs and ambulation. Baseline:  Goal status: INITIAL  4.  Pt able to ambulate short community distances without use of AD. Baseline:  Goal status: INITIAL  5.  Increase score of PSFS by at least 3 points to demonstrate measurable improvement. Baseline:  Goal status: INITIAL  6.  Decrease max pain to 2/10 with all activities. Baseline:  Goal status: INITIAL   PLAN:  PT FREQUENCY: 3x/week  PT DURATION: 10 weeks  PLANNED INTERVENTIONS: [id number]- PT Re-evaluation, 97750- Physical Performance Testing, 97110-Therapeutic exercises, 97530- Therapeutic activity, [id number]- Neuromuscular re-education, 97535- Self Care, [id number]- Manual therapy, [phone]- Gait training, [phone]- Aquatic Therapy, [phone]- Electrical stimulation (unattended), 97016- Vasopneumatic device, 20560 (1-2 muscles), 20561 (3+ muscles)- Dry Needling, Patient/Family education, Balance training, Stair training, Joint mobilization, Scar mobilization, Cryotherapy, and Moist heat  PLAN FOR NEXT SESSION: ***continue to maximize quad strength and flexion focus, work on balance and strengthening as able. Caution due to positional vertigo    Corean JULIANNA Ku, PT, DPT 08/14/24 7:32 AM   "

## 2024-08-18 ENCOUNTER — Encounter: Admitting: Physical Therapy

## 2024-08-20 ENCOUNTER — Encounter

## 2024-08-24 ENCOUNTER — Ambulatory Visit: Admitting: Surgical

## 2024-08-24 ENCOUNTER — Encounter: Admitting: Physical Therapy

## 2024-08-26 ENCOUNTER — Encounter: Admitting: Physical Therapy

## 2024-08-28 ENCOUNTER — Encounter

## 2024-09-07 ENCOUNTER — Ambulatory Visit: Admitting: Surgical
